# Patient Record
Sex: Female | Born: 1965 | Race: White | Hispanic: No | Marital: Single | Smoking: Never smoker
Health system: Southern US, Community
[De-identification: ages and names within clinical notes are randomized; demographics above are authoritative.]

## PROBLEM LIST (undated history)

## (undated) DIAGNOSIS — M7989 Other specified soft tissue disorders: Secondary | ICD-10-CM

## (undated) DIAGNOSIS — I8390 Asymptomatic varicose veins of unspecified lower extremity: Secondary | ICD-10-CM

## (undated) DIAGNOSIS — J302 Other seasonal allergic rhinitis: Secondary | ICD-10-CM

## (undated) HISTORY — PX: OTHER SURGICAL HISTORY: SHX169

---

## 2000-08-01 ENCOUNTER — Observation Stay (HOSPITAL_COMMUNITY): Admission: EM | Admit: 2000-08-01 | Discharge: 2000-08-02 | Payer: Self-pay | Admitting: Surgery

## 2000-08-29 ENCOUNTER — Emergency Department (HOSPITAL_COMMUNITY): Admission: EM | Admit: 2000-08-29 | Discharge: 2000-08-30 | Payer: Self-pay | Admitting: Emergency Medicine

## 2000-09-24 ENCOUNTER — Other Ambulatory Visit: Admission: RE | Admit: 2000-09-24 | Discharge: 2000-09-24 | Payer: Self-pay | Admitting: *Deleted

## 2016-01-30 ENCOUNTER — Emergency Department (HOSPITAL_BASED_OUTPATIENT_CLINIC_OR_DEPARTMENT_OTHER)
Admission: EM | Admit: 2016-01-30 | Discharge: 2016-01-30 | Disposition: A | Discharge status: Home / Self Care | Payer: BLUE CROSS/BLUE SHIELD | Attending: Emergency Medicine | Admitting: Emergency Medicine

## 2016-01-30 ENCOUNTER — Encounter (HOSPITAL_BASED_OUTPATIENT_CLINIC_OR_DEPARTMENT_OTHER): Payer: Self-pay | Admitting: Emergency Medicine

## 2016-01-30 DIAGNOSIS — Z8739 Personal history of other diseases of the musculoskeletal system and connective tissue: Secondary | ICD-10-CM | POA: Insufficient documentation

## 2016-01-30 DIAGNOSIS — T7840XA Allergy, unspecified, initial encounter: Secondary | ICD-10-CM | POA: Diagnosis present

## 2016-01-30 DIAGNOSIS — Z8679 Personal history of other diseases of the circulatory system: Secondary | ICD-10-CM | POA: Insufficient documentation

## 2016-01-30 DIAGNOSIS — T782XXA Anaphylactic shock, unspecified, initial encounter: Secondary | ICD-10-CM | POA: Diagnosis not present

## 2016-01-30 HISTORY — DX: Asymptomatic varicose veins of unspecified lower extremity: I83.90

## 2016-01-30 HISTORY — DX: Other seasonal allergic rhinitis: J30.2

## 2016-01-30 HISTORY — DX: Other specified soft tissue disorders: M79.89

## 2016-01-30 MED ORDER — RANITIDINE HCL 150 MG PO TABS: 150.0000 mg | ORAL_TABLET | Freq: Two times a day (BID) | ORAL | Status: DC

## 2016-01-30 MED ORDER — METHYLPREDNISOLONE SODIUM SUCC 125 MG IJ SOLR
125.0000 mg | Freq: Once | INTRAMUSCULAR | Status: AC
  Administered 2016-01-30: 125 mg via INTRAVENOUS
  Filled 2016-01-30: qty 2

## 2016-01-30 MED ORDER — DIPHENHYDRAMINE HCL 25 MG PO TABS: 50.0000 mg | ORAL_TABLET | Freq: Four times a day (QID) | ORAL | Status: DC

## 2016-01-30 MED ORDER — PREDNISONE 10 MG (21) PO TBPK: 10.0000 mg | ORAL_TABLET | Freq: Every day | ORAL | Status: DC

## 2016-01-30 NOTE — ED Provider Notes (Signed)
CSN: [id number]     Arrival date & time 01/30/16  1402 History  By signing my name below, I, Doran Stabler, attest that this documentation has been prepared under the direction and in the presence of No att. providers found. Electronically Signed: Doran Stabler, ED Scribe. 01/31/2016. 3:25 PM.   Chief Complaint  Patient presents with  . Allergic Reaction   The history is provided by the patient. No language interpreter was used.   HPI Comments: Gabrielle Vincent is a 50 y.o. female who presents to the Emergency Department with complaining of difficulty breathing and swelling due to a possible allergic reaction today, PTA. She is also tring to "build immunity to pollen" by taking local "pollen granules" today. However, she states a few minutes after ingesting the pollen granules, she began swelling up and had difficulty breathing. She was seen at [place] and received epinephrine and IV benadryl. Since then, she reports he symptoms have been improving and she is currently asymptomatic in the ED. Pt denies fevers, chills, CP, SOB, N/V/D or any other symptoms at this time. Pt has been on Allegra for 2 weeks.   Pt was recently on prednisone for an abscess on her left thigh. She felt as if her infection had worsened after being on prednisone and since then thinks "she is allergic to prednisone".  Past Medical History  Diagnosis Date  . Seasonal allergies   . Swelling of lower extremity     bilateral  . Varicose veins     right leg   Past Surgical History  Procedure Laterality Date  . Lymphnode drainage surgery     No family history on file. Social History  Substance Use Topics  . Smoking status: Never Smoker   . Smokeless tobacco: None  . Alcohol Use: No   OB History    No data available     Review of Systems  Constitutional: Negative for fever and chills.  Respiratory: Negative for shortness of breath.   Cardiovascular: Negative for chest pain.  Gastrointestinal: Negative for  nausea, vomiting and diarrhea.  All other systems reviewed and are negative.   Allergies  Prednisone  Home Medications   Prior to Admission medications   Medication Sig Start Date End Date Taking? Authorizing Provider  diphenhydrAMINE (BENADRYL) 25 MG tablet Take 2 tablets (50 mg total) by mouth every 6 (six) hours. Take 1-2 tablets every 6 hours x 2 days, then space out to an as needed basis 01/30/16   Alfonzo Beers, MD  predniSONE (STERAPRED UNI-PAK 21 TAB) 10 MG (21) TBPK tablet Take 1 tablet (10 mg total) by mouth daily. Take 6 tabs by mouth daily  for 2 days, then 5 tabs for 2 days, then 4 tabs for 2 days, then 3 tabs for 2 days, 2 tabs for 2 days, then 1 tab by mouth daily for 2 days 01/30/16   Alfonzo Beers, MD  ranitidine (ZANTAC) 150 MG tablet Take 1 tablet (150 mg total) by mouth 2 (two) times daily. 01/30/16   Alfonzo Beers, MD   BP 161/99 mmHg  Pulse 101  Temp(Src) 98.3 F (36.8 C) (Oral)  Resp 18  Ht 5\' 4"  (1.626 m)  Wt 175 lb (79.379 kg)  BMI 30.02 kg/m2  SpO2 97%  LMP  (LMP Unknown)  Vitals reviewed Physical Exam  Physical Examination: General appearance - alert, well appearing, and in no distress Mental status - alert, oriented to person, place, and time Eyes - no conjunctival injection, no scleral icterus Mouth -  mucous membranes moist, pharynx normal without lesions, no lip or tongue swelling, no uvular swelling Chest - clear to auscultation, no wheezes, rales or rhonchi, symmetric air entry Heart - normal rate, regular rhythm, normal S1, S2, no murmurs, rubs, clicks or gallops Abdomen - soft, nontender, nondistended, no masses or organomegaly Neurological - alert, oriented, normal speech Extremities - peripheral pulses normal, no pedal edema, no clubbing or cyanosis Skin - normal coloration and turgor, erythema and swelling of face  ED Course  Procedures  DIAGNOSTIC STUDIES: Oxygen Saturation is 100% on room air, normal by my interpretation.    COORDINATION  OF CARE: 3:16 PM Will give Solumedrol injection. Discussed treatment plan with pt at bedside and pt agreed to plan.  Labs Review MDM   Final diagnoses:  Anaphylaxis, initial encounter    Pt presenting with c/o facial swelling and difficulty breathing, she was treated immediately upon arrival to the ED- she had received epinephrine by EMS and IV benadryl, she was given solumedrol in the ED and observed to be sure there is no rebound from epinephrine.  Pt continues to feel improved and stable upon observation.    Of note, patient is not allergic to prednisone as documented- when she took prednisone years ago she states she developed an abscess that needed to be drained.  I explained to patient that this was an effect from the actio of prednisone and not an allergic reaction.  In this case of allergic reaction she definitely needs to be on a steroid- she is in agreement with this plan.    Discharged with strict return precautions.  Pt agreeable with plan.  I personally performed the services described in this documentation, which was scribed in my presence. The recorded information has been reviewed and is accurate.     Alfonzo Beers, MD 02/01/16 0002

## 2016-01-30 NOTE — ED Notes (Signed)
MD at bedside. 

## 2016-01-30 NOTE — ED Notes (Signed)
Patient ambulatory to restroom with cane.  

## 2016-01-30 NOTE — ED Notes (Signed)
Pt given Rx x 3 for zantac, prednisone, and benadryl. Pt's brother is picking her up from the ED. Ambulatory with steady gait to d/c window

## 2016-01-30 NOTE — ED Notes (Addendum)
Per EMS, patient picked up at Urgent Care. Patient went to be seen for allergic reaction, states she took Bee Pollen tablets @40  minutes ago. Patient with facial and neck swelling, was flushed, and reported difficulty breathing. Patient was given 0.3mg  epinipherine auto injector by urgent care and 50mg  IV benadryl by EMS. Patient is allergic to prednisone. Swelling has decreased, and patient is no longer flushed. Lungs CTA throughout.   Patient reports having a recent seasonal allergy flare up, states she had taken Bee Pollen in the past and thought she would try it again. Patient also recently used Allegra and an inhaler. Patient states she took @ 1/2 teaspoon of bee pollen granules. Patient states she noticed she was having a heard time breathing, began choking and gagging, and coughing. Patient states she had a significant amount of phlegm production. Patient states she then drove herself to the urgent care. Patient states she was swelling to her face, neck, and hands, swelling is resolved at this time. Patient is able to speak in complete sentences without difficulty at this time. Patient reports her only other allergy to be prednisone, and potentially to an unknown antibiotic. Patient describes lymph swelling as the allergic reaction to the prednisone.

## 2016-01-30 NOTE — Discharge Instructions (Signed)
Return to the ED with any concerns including difficulty breathing, lip or tongue swelling, vomiting, fainting, decreased level of alertness/lethargy, or any other alarming symptoms °

## 2016-02-07 DIAGNOSIS — I10 Essential (primary) hypertension: Secondary | ICD-10-CM | POA: Insufficient documentation

## 2016-02-07 DIAGNOSIS — T7840XA Allergy, unspecified, initial encounter: Secondary | ICD-10-CM | POA: Insufficient documentation

## 2016-02-07 DIAGNOSIS — E785 Hyperlipidemia, unspecified: Secondary | ICD-10-CM | POA: Insufficient documentation

## 2016-02-07 HISTORY — DX: Hyperlipidemia, unspecified: E78.5

## 2016-05-26 DIAGNOSIS — M1711 Unilateral primary osteoarthritis, right knee: Secondary | ICD-10-CM | POA: Insufficient documentation

## 2016-08-02 DIAGNOSIS — M23203 Derangement of unspecified medial meniscus due to old tear or injury, right knee: Secondary | ICD-10-CM | POA: Insufficient documentation

## 2021-01-24 ENCOUNTER — Other Ambulatory Visit: Payer: Self-pay | Admitting: Orthopedic Surgery

## 2021-01-24 DIAGNOSIS — M25562 Pain in left knee: Secondary | ICD-10-CM

## 2021-01-29 ENCOUNTER — Ambulatory Visit
Admission: RE | Admit: 2021-01-29 | Discharge: 2021-01-29 | Disposition: A | Discharge status: Home / Self Care | Payer: BLUE CROSS/BLUE SHIELD | Source: Ambulatory Visit | Attending: Orthopedic Surgery | Admitting: Orthopedic Surgery

## 2021-01-29 ENCOUNTER — Other Ambulatory Visit: Payer: Self-pay

## 2021-01-29 DIAGNOSIS — M25562 Pain in left knee: Secondary | ICD-10-CM

## 2021-01-29 IMAGING — MR MR KNEE*L* W/O CM
4 of 6 series · 23 of 40 positions shown · non-contrast
Comparison: None.

CLINICAL DATA: Onset left knee pain approximately 2-1/2 weeks ago.
No known injury.

EXAM:
MRI OF THE LEFT KNEE WITHOUT CONTRAST
TECHNIQUE: Multiplanar, multisequence MR imaging of the knee was performed. No
intravenous contrast was administered.

[Series 3: T2 fat-sat · axial · 4.0mm · 0.53mm/px · z∈[-43,+52]mm · 6 of 24 slices shown (1 of 2)]
[im 1/24]
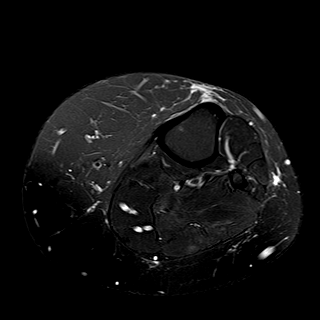
[im 4/24]
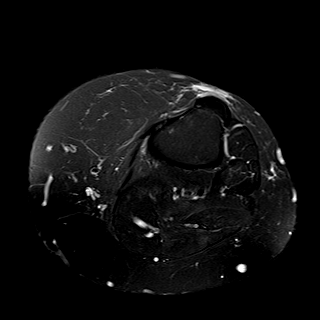
[im 8/24]
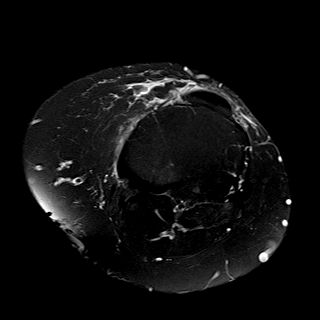
[im 12/24]
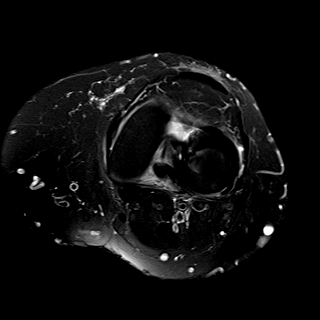
[im 16/24]
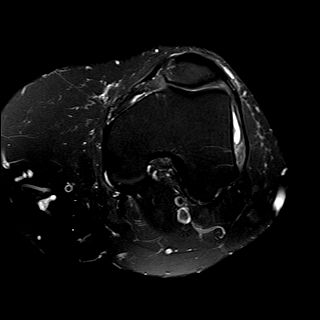
[im 20/24]
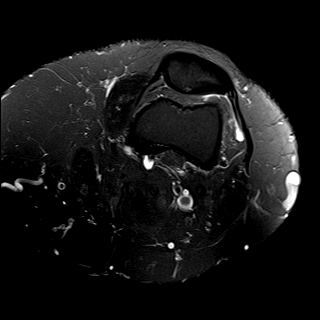

[Series 5: T2 fat-sat · coronal · 4.0mm · 0.31mm/px · 3 of 24 slices shown (2 of 2)]
[im 5/24]
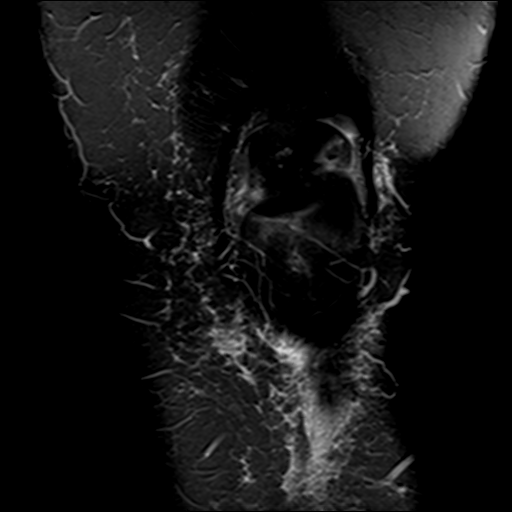
[im 14/24]
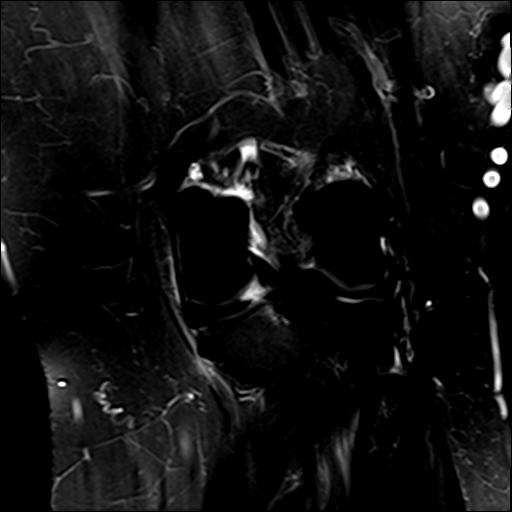
[im 24/24]
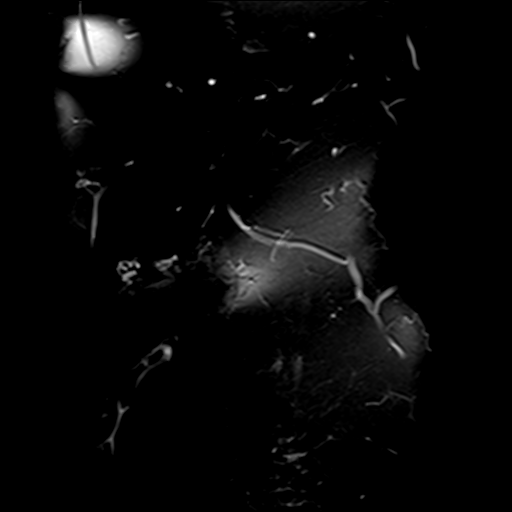

[Series 7: PD fat-sat · sagittal · 3.0mm · 0.31mm/px · 7 of 27 slices shown (1 of 2)]
[im 1/27]
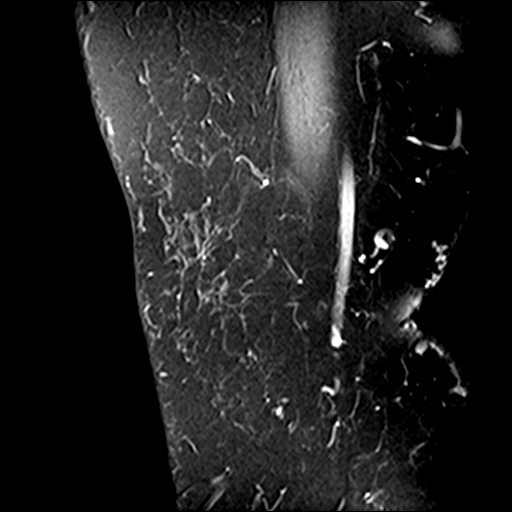
[im 5/27]
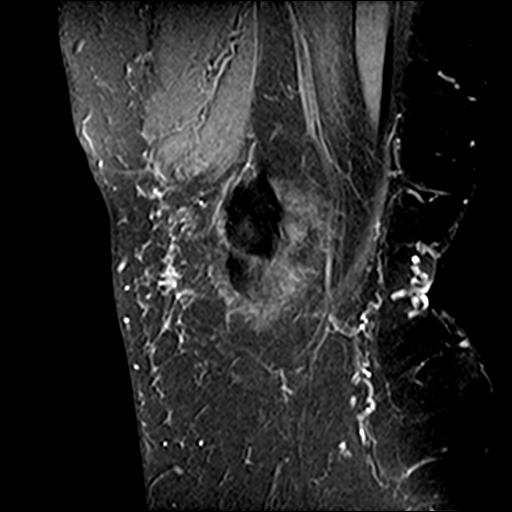
[im 9/27]
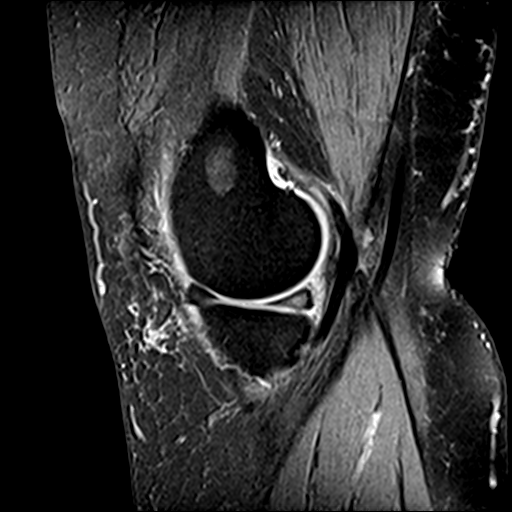
[im 14/27]
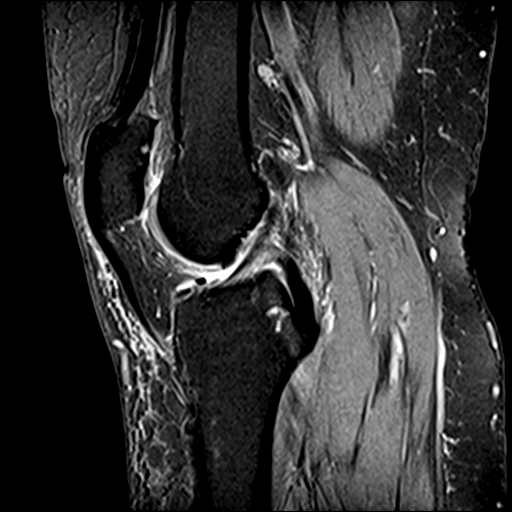
[im 18/27]
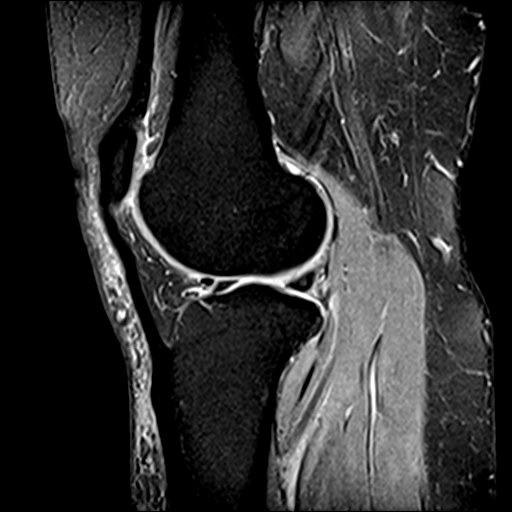
[im 22/27]
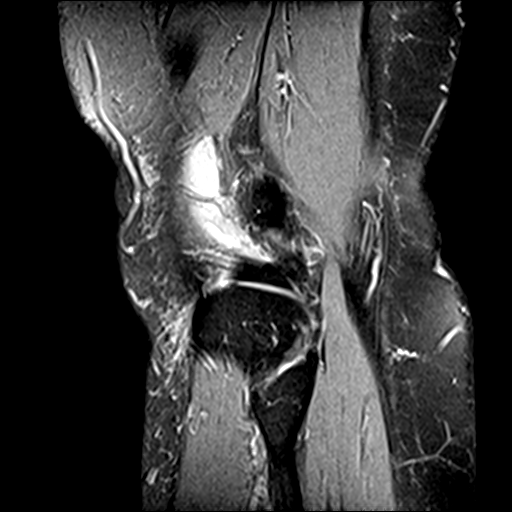
[im 27/27]
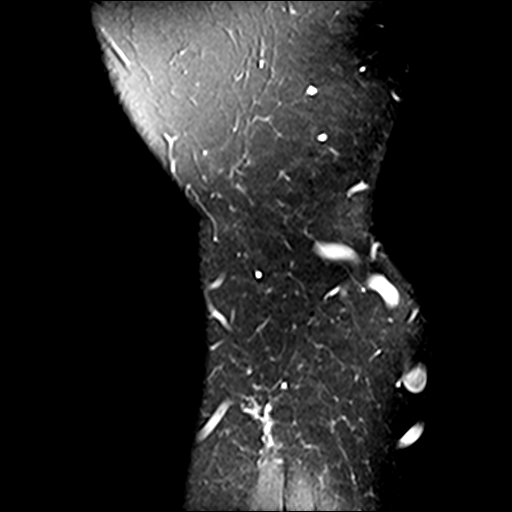

[Series 8: PD fat-sat · coronal · 3.0mm · 0.31mm/px · 7 of 28 slices shown (2 of 2)]
[im 1/28]
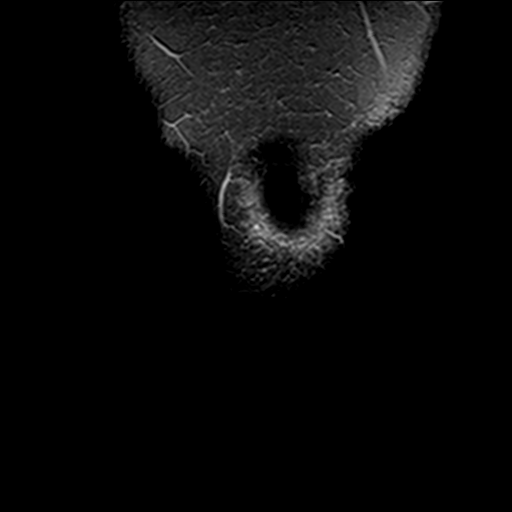
[im 5/28]
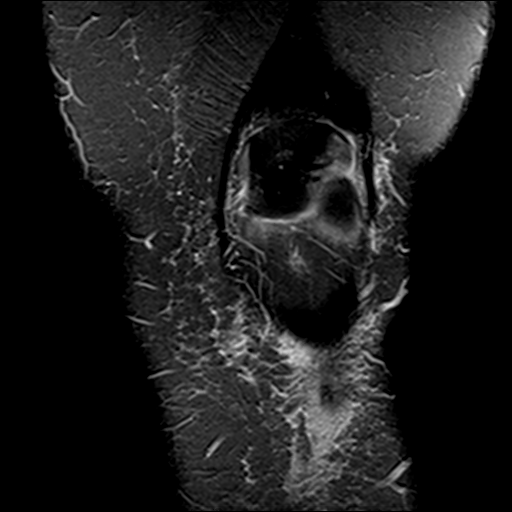
[im 10/28]
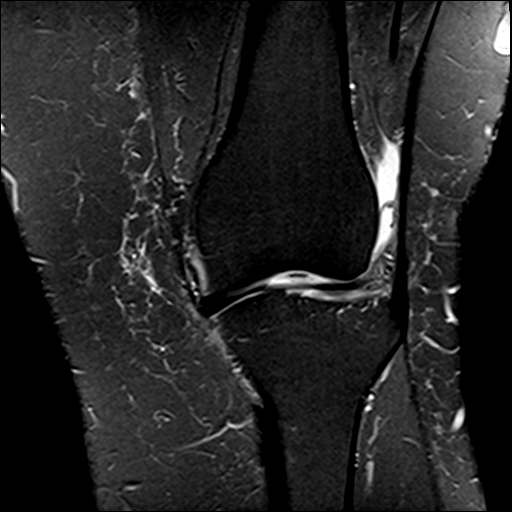
[im 14/28]
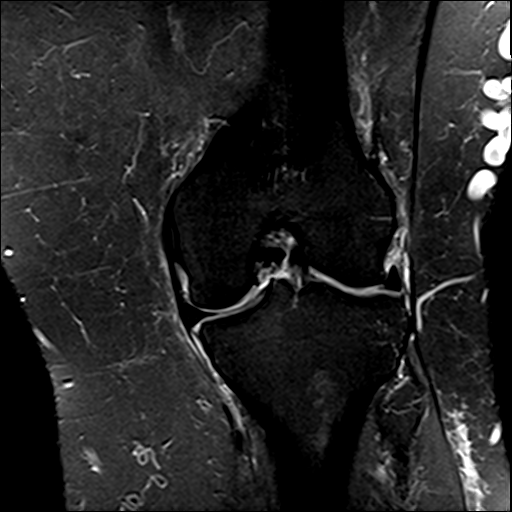
[im 19/28]
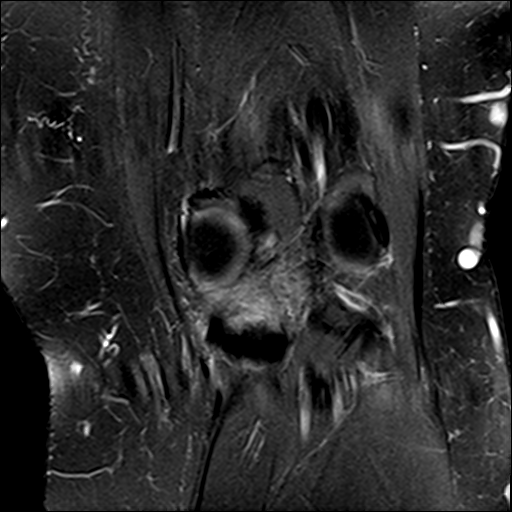
[im 23/28]
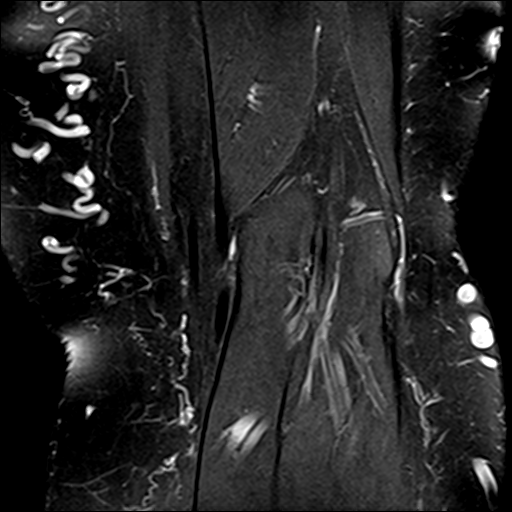
[im 28/28]
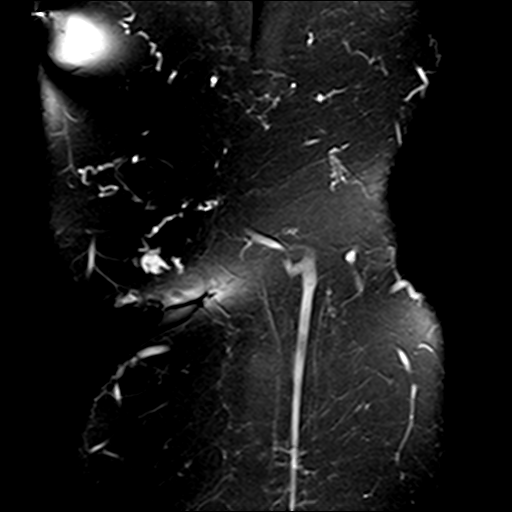

[23 of 40 positions shown; findings below may reference images not displayed]

FINDINGS: MENISCI

Medial meniscus: Complete radial tear through the root of the
posterior horn is seen. There is peripheral displacement of 0.6 cm.
Globular intrasubstance increased T2 signal in the remainder of the
posterior horn noted.

Lateral meniscus:  Intact.

LIGAMENTS

Cruciates:  Intact.

Collaterals:  Intact.

CARTILAGE

Patellofemoral: Cartilage thinning is most notable at the patellar
apex in the midpole.

Medial:  Preserved.

Lateral:  Preserved.

Joint:  Small joint effusion.

Popliteal Fossa:  No Baker's cyst.

Extensor Mechanism:  Intact.

Bones: No fracture, stress change or worrisome lesion. Small
osteophytes are seen about the knee.

Other: Multiple varicosities in the subcutaneous tissues noted.
IMPRESSION: Complete radial tear root of the posterior horn of the medial
meniscus with peripheral displacement of approximately 0.6 cm.

Mild osteoarthritis most notable in the patellofemoral compartment.

## 2022-02-14 ENCOUNTER — Inpatient Hospital Stay (HOSPITAL_BASED_OUTPATIENT_CLINIC_OR_DEPARTMENT_OTHER)
Admission: EM | Admit: 2022-02-14 | Discharge: 2022-03-21 | DRG: 003 | Disposition: A | Discharge status: Other Acute Inpatient Hospital | Payer: BC Managed Care – PPO | Attending: Family Medicine | Admitting: Family Medicine

## 2022-02-14 ENCOUNTER — Inpatient Hospital Stay (HOSPITAL_COMMUNITY): Payer: BC Managed Care – PPO

## 2022-02-14 ENCOUNTER — Encounter (HOSPITAL_BASED_OUTPATIENT_CLINIC_OR_DEPARTMENT_OTHER): Payer: Self-pay | Admitting: Emergency Medicine

## 2022-02-14 ENCOUNTER — Other Ambulatory Visit: Payer: Self-pay

## 2022-02-14 ENCOUNTER — Emergency Department (HOSPITAL_BASED_OUTPATIENT_CLINIC_OR_DEPARTMENT_OTHER): Payer: BC Managed Care – PPO

## 2022-02-14 DIAGNOSIS — A4151 Sepsis due to Escherichia coli [E. coli]: Principal | ICD-10-CM | POA: Diagnosis present

## 2022-02-14 DIAGNOSIS — G9341 Metabolic encephalopathy: Secondary | ICD-10-CM | POA: Diagnosis present

## 2022-02-14 DIAGNOSIS — E139 Other specified diabetes mellitus without complications: Secondary | ICD-10-CM | POA: Diagnosis not present

## 2022-02-14 DIAGNOSIS — E876 Hypokalemia: Secondary | ICD-10-CM

## 2022-02-14 DIAGNOSIS — M4802 Spinal stenosis, cervical region: Secondary | ICD-10-CM | POA: Diagnosis present

## 2022-02-14 DIAGNOSIS — B9620 Unspecified Escherichia coli [E. coli] as the cause of diseases classified elsewhere: Secondary | ICD-10-CM | POA: Diagnosis not present

## 2022-02-14 DIAGNOSIS — L89626 Pressure-induced deep tissue damage of left heel: Secondary | ICD-10-CM | POA: Diagnosis not present

## 2022-02-14 DIAGNOSIS — K8000 Calculus of gallbladder with acute cholecystitis without obstruction: Secondary | ICD-10-CM | POA: Diagnosis not present

## 2022-02-14 DIAGNOSIS — N10 Acute tubulo-interstitial nephritis: Secondary | ICD-10-CM

## 2022-02-14 DIAGNOSIS — R6521 Severe sepsis with septic shock: Secondary | ICD-10-CM | POA: Diagnosis present

## 2022-02-14 DIAGNOSIS — G061 Intraspinal abscess and granuloma: Secondary | ICD-10-CM | POA: Diagnosis present

## 2022-02-14 DIAGNOSIS — G9349 Other encephalopathy: Secondary | ICD-10-CM

## 2022-02-14 DIAGNOSIS — R509 Fever, unspecified: Secondary | ICD-10-CM | POA: Diagnosis not present

## 2022-02-14 DIAGNOSIS — R21 Rash and other nonspecific skin eruption: Secondary | ICD-10-CM | POA: Diagnosis not present

## 2022-02-14 DIAGNOSIS — R532 Functional quadriplegia: Secondary | ICD-10-CM | POA: Diagnosis not present

## 2022-02-14 DIAGNOSIS — M5416 Radiculopathy, lumbar region: Secondary | ICD-10-CM

## 2022-02-14 DIAGNOSIS — M4646 Discitis, unspecified, lumbar region: Secondary | ICD-10-CM | POA: Diagnosis present

## 2022-02-14 DIAGNOSIS — J9601 Acute respiratory failure with hypoxia: Secondary | ICD-10-CM | POA: Diagnosis present

## 2022-02-14 DIAGNOSIS — R918 Other nonspecific abnormal finding of lung field: Secondary | ICD-10-CM | POA: Diagnosis not present

## 2022-02-14 DIAGNOSIS — E1165 Type 2 diabetes mellitus with hyperglycemia: Secondary | ICD-10-CM | POA: Diagnosis present

## 2022-02-14 DIAGNOSIS — R6520 Severe sepsis without septic shock: Secondary | ICD-10-CM | POA: Diagnosis not present

## 2022-02-14 DIAGNOSIS — E870 Hyperosmolality and hypernatremia: Secondary | ICD-10-CM | POA: Diagnosis not present

## 2022-02-14 DIAGNOSIS — E66812 Obesity, class 2: Secondary | ICD-10-CM | POA: Diagnosis present

## 2022-02-14 DIAGNOSIS — R4781 Slurred speech: Secondary | ICD-10-CM | POA: Diagnosis not present

## 2022-02-14 DIAGNOSIS — G9511 Acute infarction of spinal cord (embolic) (nonembolic): Secondary | ICD-10-CM | POA: Diagnosis not present

## 2022-02-14 DIAGNOSIS — Z9911 Dependence on respirator [ventilator] status: Secondary | ICD-10-CM | POA: Diagnosis not present

## 2022-02-14 DIAGNOSIS — M5116 Intervertebral disc disorders with radiculopathy, lumbar region: Secondary | ICD-10-CM | POA: Diagnosis present

## 2022-02-14 DIAGNOSIS — L89611 Pressure ulcer of right heel, stage 1: Secondary | ICD-10-CM | POA: Diagnosis not present

## 2022-02-14 DIAGNOSIS — N390 Urinary tract infection, site not specified: Secondary | ICD-10-CM | POA: Diagnosis not present

## 2022-02-14 DIAGNOSIS — Z9889 Other specified postprocedural states: Secondary | ICD-10-CM | POA: Diagnosis not present

## 2022-02-14 DIAGNOSIS — E8721 Acute metabolic acidosis: Secondary | ICD-10-CM | POA: Diagnosis present

## 2022-02-14 DIAGNOSIS — K8020 Calculus of gallbladder without cholecystitis without obstruction: Secondary | ICD-10-CM | POA: Insufficient documentation

## 2022-02-14 DIAGNOSIS — I8390 Asymptomatic varicose veins of unspecified lower extremity: Secondary | ICD-10-CM | POA: Diagnosis present

## 2022-02-14 DIAGNOSIS — R7881 Bacteremia: Secondary | ICD-10-CM | POA: Diagnosis not present

## 2022-02-14 DIAGNOSIS — N170 Acute kidney failure with tubular necrosis: Secondary | ICD-10-CM | POA: Diagnosis present

## 2022-02-14 DIAGNOSIS — I700 Atherosclerosis of aorta: Secondary | ICD-10-CM | POA: Diagnosis present

## 2022-02-14 DIAGNOSIS — E669 Obesity, unspecified: Secondary | ICD-10-CM | POA: Diagnosis present

## 2022-02-14 DIAGNOSIS — R1310 Dysphagia, unspecified: Secondary | ICD-10-CM | POA: Diagnosis not present

## 2022-02-14 DIAGNOSIS — K2289 Other specified disease of esophagus: Secondary | ICD-10-CM | POA: Insufficient documentation

## 2022-02-14 DIAGNOSIS — G9340 Encephalopathy, unspecified: Secondary | ICD-10-CM | POA: Diagnosis not present

## 2022-02-14 DIAGNOSIS — G009 Bacterial meningitis, unspecified: Secondary | ICD-10-CM | POA: Diagnosis not present

## 2022-02-14 DIAGNOSIS — K760 Fatty (change of) liver, not elsewhere classified: Secondary | ICD-10-CM | POA: Diagnosis present

## 2022-02-14 DIAGNOSIS — M545 Low back pain, unspecified: Secondary | ICD-10-CM | POA: Diagnosis present

## 2022-02-14 DIAGNOSIS — M21372 Foot drop, left foot: Secondary | ICD-10-CM | POA: Diagnosis not present

## 2022-02-14 DIAGNOSIS — E860 Dehydration: Secondary | ICD-10-CM | POA: Diagnosis present

## 2022-02-14 DIAGNOSIS — M5117 Intervertebral disc disorders with radiculopathy, lumbosacral region: Secondary | ICD-10-CM | POA: Diagnosis present

## 2022-02-14 DIAGNOSIS — J9600 Acute respiratory failure, unspecified whether with hypoxia or hypercapnia: Secondary | ICD-10-CM | POA: Diagnosis not present

## 2022-02-14 DIAGNOSIS — Z881 Allergy status to other antibiotic agents status: Secondary | ICD-10-CM

## 2022-02-14 DIAGNOSIS — R911 Solitary pulmonary nodule: Secondary | ICD-10-CM | POA: Diagnosis present

## 2022-02-14 DIAGNOSIS — N179 Acute kidney failure, unspecified: Secondary | ICD-10-CM | POA: Diagnosis present

## 2022-02-14 DIAGNOSIS — D751 Secondary polycythemia: Secondary | ICD-10-CM | POA: Diagnosis present

## 2022-02-14 DIAGNOSIS — I10 Essential (primary) hypertension: Secondary | ICD-10-CM | POA: Diagnosis present

## 2022-02-14 DIAGNOSIS — J9311 Primary spontaneous pneumothorax: Secondary | ICD-10-CM

## 2022-02-14 DIAGNOSIS — D696 Thrombocytopenia, unspecified: Secondary | ICD-10-CM | POA: Diagnosis present

## 2022-02-14 DIAGNOSIS — Z880 Allergy status to penicillin: Secondary | ICD-10-CM

## 2022-02-14 DIAGNOSIS — M5440 Lumbago with sciatica, unspecified side: Secondary | ICD-10-CM | POA: Diagnosis not present

## 2022-02-14 DIAGNOSIS — J690 Pneumonitis due to inhalation of food and vomit: Secondary | ICD-10-CM | POA: Diagnosis not present

## 2022-02-14 DIAGNOSIS — B9689 Other specified bacterial agents as the cause of diseases classified elsewhere: Secondary | ICD-10-CM | POA: Diagnosis not present

## 2022-02-14 DIAGNOSIS — Z6841 Body Mass Index (BMI) 40.0 and over, adult: Secondary | ICD-10-CM

## 2022-02-14 DIAGNOSIS — I6312 Cerebral infarction due to embolism of basilar artery: Secondary | ICD-10-CM | POA: Diagnosis not present

## 2022-02-14 DIAGNOSIS — Z888 Allergy status to other drugs, medicaments and biological substances status: Secondary | ICD-10-CM

## 2022-02-14 DIAGNOSIS — E232 Diabetes insipidus: Secondary | ICD-10-CM

## 2022-02-14 DIAGNOSIS — A419 Sepsis, unspecified organism: Secondary | ICD-10-CM | POA: Diagnosis present

## 2022-02-14 DIAGNOSIS — E871 Hypo-osmolality and hyponatremia: Secondary | ICD-10-CM | POA: Diagnosis present

## 2022-02-14 DIAGNOSIS — K146 Glossodynia: Secondary | ICD-10-CM | POA: Diagnosis not present

## 2022-02-14 DIAGNOSIS — L89896 Pressure-induced deep tissue damage of other site: Secondary | ICD-10-CM | POA: Diagnosis not present

## 2022-02-14 DIAGNOSIS — M4726 Other spondylosis with radiculopathy, lumbar region: Secondary | ICD-10-CM | POA: Diagnosis present

## 2022-02-14 DIAGNOSIS — K209 Esophagitis, unspecified without bleeding: Secondary | ICD-10-CM | POA: Diagnosis present

## 2022-02-14 DIAGNOSIS — M009 Pyogenic arthritis, unspecified: Secondary | ICD-10-CM | POA: Diagnosis present

## 2022-02-14 DIAGNOSIS — J9621 Acute and chronic respiratory failure with hypoxia: Secondary | ICD-10-CM | POA: Diagnosis not present

## 2022-02-14 DIAGNOSIS — K7200 Acute and subacute hepatic failure without coma: Secondary | ICD-10-CM | POA: Diagnosis not present

## 2022-02-14 DIAGNOSIS — R7989 Other specified abnormal findings of blood chemistry: Secondary | ICD-10-CM | POA: Diagnosis present

## 2022-02-14 DIAGNOSIS — M21371 Foot drop, right foot: Secondary | ICD-10-CM | POA: Diagnosis not present

## 2022-02-14 DIAGNOSIS — G062 Extradural and subdural abscess, unspecified: Secondary | ICD-10-CM | POA: Diagnosis not present

## 2022-02-14 DIAGNOSIS — G039 Meningitis, unspecified: Secondary | ICD-10-CM

## 2022-02-14 DIAGNOSIS — Z1611 Resistance to penicillins: Secondary | ICD-10-CM | POA: Diagnosis present

## 2022-02-14 DIAGNOSIS — L89309 Pressure ulcer of unspecified buttock, unspecified stage: Secondary | ICD-10-CM | POA: Diagnosis not present

## 2022-02-14 DIAGNOSIS — I6329 Cerebral infarction due to unspecified occlusion or stenosis of other precerebral arteries: Secondary | ICD-10-CM | POA: Diagnosis not present

## 2022-02-14 DIAGNOSIS — D252 Subserosal leiomyoma of uterus: Secondary | ICD-10-CM | POA: Diagnosis present

## 2022-02-14 DIAGNOSIS — E785 Hyperlipidemia, unspecified: Secondary | ICD-10-CM | POA: Diagnosis present

## 2022-02-14 DIAGNOSIS — K5790 Diverticulosis of intestine, part unspecified, without perforation or abscess without bleeding: Secondary | ICD-10-CM

## 2022-02-14 DIAGNOSIS — Z794 Long term (current) use of insulin: Secondary | ICD-10-CM | POA: Diagnosis not present

## 2022-02-14 DIAGNOSIS — G008 Other bacterial meningitis: Secondary | ICD-10-CM | POA: Diagnosis present

## 2022-02-14 DIAGNOSIS — E781 Pure hyperglyceridemia: Secondary | ICD-10-CM | POA: Diagnosis present

## 2022-02-14 DIAGNOSIS — G8220 Paraplegia, unspecified: Secondary | ICD-10-CM | POA: Diagnosis not present

## 2022-02-14 DIAGNOSIS — E878 Other disorders of electrolyte and fluid balance, not elsewhere classified: Secondary | ICD-10-CM | POA: Diagnosis not present

## 2022-02-14 DIAGNOSIS — D6489 Other specified anemias: Secondary | ICD-10-CM | POA: Diagnosis present

## 2022-02-14 DIAGNOSIS — R6510 Systemic inflammatory response syndrome (SIRS) of non-infectious origin without acute organ dysfunction: Secondary | ICD-10-CM

## 2022-02-14 DIAGNOSIS — E8770 Fluid overload, unspecified: Secondary | ICD-10-CM | POA: Diagnosis not present

## 2022-02-14 DIAGNOSIS — M4642 Discitis, unspecified, cervical region: Secondary | ICD-10-CM | POA: Diagnosis present

## 2022-02-14 DIAGNOSIS — L8990 Pressure ulcer of unspecified site, unspecified stage: Secondary | ICD-10-CM | POA: Insufficient documentation

## 2022-02-14 HISTORY — DX: Obesity, unspecified: E66.9

## 2022-02-14 HISTORY — DX: Fatty (change of) liver, not elsewhere classified: K76.0

## 2022-02-14 HISTORY — DX: Atherosclerosis of aorta: I70.0

## 2022-02-14 HISTORY — DX: Acute kidney failure, unspecified: N17.9

## 2022-02-14 HISTORY — DX: Diverticulosis of intestine, part unspecified, without perforation or abscess without bleeding: K57.90

## 2022-02-14 LAB — URINALYSIS, ROUTINE W REFLEX MICROSCOPIC
Bilirubin Urine: NEGATIVE
Glucose, UA: NEGATIVE mg/dL
Ketones, ur: NEGATIVE mg/dL
Nitrite: NEGATIVE
Protein, ur: 100 mg/dL — AB
RBC / HPF: >50 RBC/hpf — ABNORMAL HIGH (ref 0–5)
Specific Gravity, Urine: 1.012 (ref 1.005–1.030)
WBC, UA: >50 WBC/hpf — ABNORMAL HIGH (ref 0–5)
pH: 5.5 (ref 5.0–8.0)

## 2022-02-14 LAB — GLUCOSE, CAPILLARY
Glucose-Capillary: 168 mg/dL — ABNORMAL HIGH (ref 70–99)
Glucose-Capillary: 247 mg/dL — ABNORMAL HIGH (ref 70–99)

## 2022-02-14 LAB — COMPREHENSIVE METABOLIC PANEL
ALT: 58 U/L — ABNORMAL HIGH (ref 0–44)
ALT: 66 U/L — ABNORMAL HIGH (ref 0–44)
AST: 94 U/L — ABNORMAL HIGH (ref 15–41)
AST: 122 U/L — ABNORMAL HIGH (ref 15–41)
Albumin: 3.4 g/dL — ABNORMAL LOW (ref 3.5–5.0)
Albumin: 2.6 g/dL — ABNORMAL LOW (ref 3.5–5.0)
Alkaline Phosphatase: 269 U/L — ABNORMAL HIGH (ref 38–126)
Alkaline Phosphatase: 256 U/L — ABNORMAL HIGH (ref 38–126)
Anion gap: 19 — ABNORMAL HIGH (ref 5–15)
Anion gap: 14 (ref 5–15)
BUN: 49 mg/dL — ABNORMAL HIGH (ref 6–20)
BUN: 52 mg/dL — ABNORMAL HIGH (ref 6–20)
CO2: 18 mmol/L — ABNORMAL LOW (ref 22–32)
CO2: 15 mmol/L — ABNORMAL LOW (ref 22–32)
Calcium: 9.4 mg/dL (ref 8.9–10.3)
Calcium: 7.8 mg/dL — ABNORMAL LOW (ref 8.9–10.3)
Chloride: 90 mmol/L — ABNORMAL LOW (ref 98–111)
Chloride: 100 mmol/L (ref 98–111)
Creatinine, Ser: 3.64 mg/dL — ABNORMAL HIGH (ref 0.44–1.00)
Creatinine, Ser: 3.3 mg/dL — ABNORMAL HIGH (ref 0.44–1.00)
GFR, Estimated: 14 mL/min — ABNORMAL LOW (ref >60)
GFR, Estimated: 16 mL/min — ABNORMAL LOW (ref >60)
Glucose, Bld: 185 mg/dL — ABNORMAL HIGH (ref 70–99)
Glucose, Bld: 168 mg/dL — ABNORMAL HIGH (ref 70–99)
Potassium: 3.4 mmol/L — ABNORMAL LOW (ref 3.5–5.1)
Potassium: 3.4 mmol/L — ABNORMAL LOW (ref 3.5–5.1)
Sodium: 127 mmol/L — ABNORMAL LOW (ref 135–145)
Sodium: 129 mmol/L — ABNORMAL LOW (ref 135–145)
Total Bilirubin: 2.1 mg/dL — ABNORMAL HIGH (ref 0.3–1.2)
Total Bilirubin: 2.3 mg/dL — ABNORMAL HIGH (ref 0.3–1.2)
Total Protein: 7.3 g/dL (ref 6.5–8.1)
Total Protein: 6.8 g/dL (ref 6.5–8.1)

## 2022-02-14 LAB — CBC WITH DIFFERENTIAL/PLATELET
Abs Immature Granulocytes: 0.93 10*3/uL — ABNORMAL HIGH (ref 0.00–0.07)
Basophils Absolute: 0 10*3/uL (ref 0.0–0.1)
Basophils Relative: 0 %
Eosinophils Absolute: 0 10*3/uL (ref 0.0–0.5)
Eosinophils Relative: 0 %
HCT: 46.1 % — ABNORMAL HIGH (ref 36.0–46.0)
Hemoglobin: 15.9 g/dL — ABNORMAL HIGH (ref 12.0–15.0)
Immature Granulocytes: 4 %
Lymphocytes Relative: 4 %
Lymphs Abs: 0.9 10*3/uL (ref 0.7–4.0)
MCH: 28.8 pg (ref 26.0–34.0)
MCHC: 34.5 g/dL (ref 30.0–36.0)
MCV: 83.5 fL (ref 80.0–100.0)
Monocytes Absolute: 1.4 10*3/uL — ABNORMAL HIGH (ref 0.1–1.0)
Monocytes Relative: 7 %
Neutro Abs: 18 10*3/uL — ABNORMAL HIGH (ref 1.7–7.7)
Neutrophils Relative %: 85 %
Platelets: 106 10*3/uL — ABNORMAL LOW (ref 150–400)
RBC: 5.52 MIL/uL — ABNORMAL HIGH (ref 3.87–5.11)
RDW: 15.9 % — ABNORMAL HIGH (ref 11.5–15.5)
WBC: 21.3 10*3/uL — ABNORMAL HIGH (ref 4.0–10.5)
nRBC: 0 % (ref 0.0–0.2)

## 2022-02-14 LAB — CBC
HCT: 44.1 % (ref 36.0–46.0)
Hemoglobin: 15.2 g/dL — ABNORMAL HIGH (ref 12.0–15.0)
MCH: 29.3 pg (ref 26.0–34.0)
MCHC: 34.5 g/dL (ref 30.0–36.0)
MCV: 85 fL (ref 80.0–100.0)
Platelets: 101 10*3/uL — ABNORMAL LOW (ref 150–400)
RBC: 5.19 MIL/uL — ABNORMAL HIGH (ref 3.87–5.11)
RDW: 15.9 % — ABNORMAL HIGH (ref 11.5–15.5)
WBC: 16.1 10*3/uL — ABNORMAL HIGH (ref 4.0–10.5)
nRBC: 0 % (ref 0.0–0.2)

## 2022-02-14 LAB — PROCALCITONIN: Procalcitonin: 35.18 ng/mL

## 2022-02-14 LAB — HEPATITIS PANEL, ACUTE
HCV Ab: NONREACTIVE
Hep A IgM: NONREACTIVE
Hep B C IgM: NONREACTIVE
Hepatitis B Surface Ag: NONREACTIVE

## 2022-02-14 LAB — LIPASE, BLOOD: Lipase: 23 U/L (ref 11–51)

## 2022-02-14 LAB — LACTIC ACID, PLASMA: Lactic Acid, Venous: 1.9 mmol/L (ref 0.5–1.9)

## 2022-02-14 IMAGING — MR MR LUMBAR SPINE W/O CM
5 series · 31 of 48 positions shown · non-contrast
Comparison: None Available.

CLINICAL DATA: Lumbar myelopathy

EXAM:
MRI LUMBAR SPINE WITHOUT CONTRAST
TECHNIQUE: Multiplanar, multisequence MR imaging of the lumbar spine was
performed. No intravenous contrast was administered.

[Series 5: T1 · sagittal · 4.0mm · 0.81mm/px · 6 of 17 slices shown (1 of 2)]
[im 1/17]
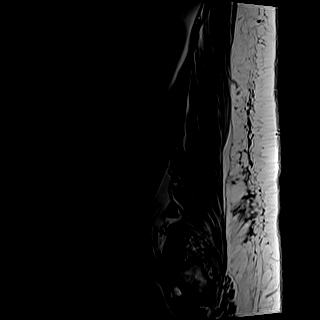
[im 4/17]
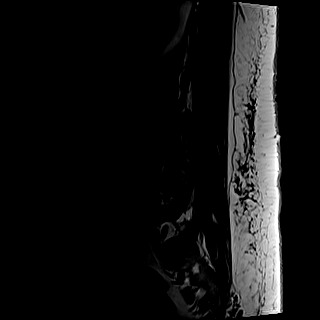
[im 7/17]
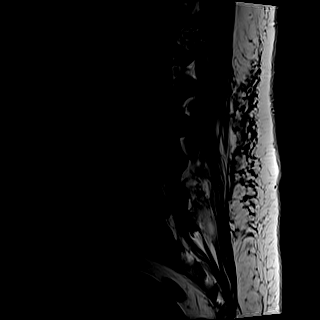
[im 10/17]
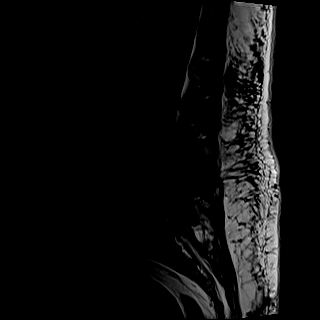
[im 13/17]
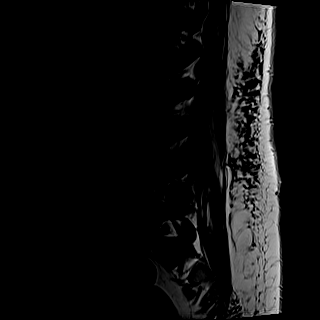
[im 17/17]
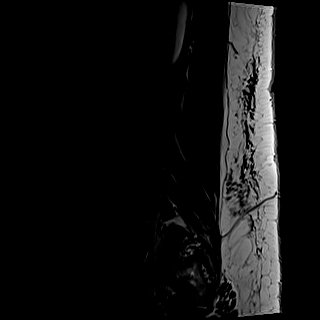

[Series 6: T2 · sagittal · 4.0mm · 0.81mm/px · 6 of 17 slices shown (1 of 2)]
[im 1/17]
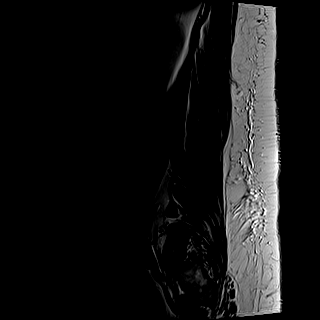
[im 4/17]
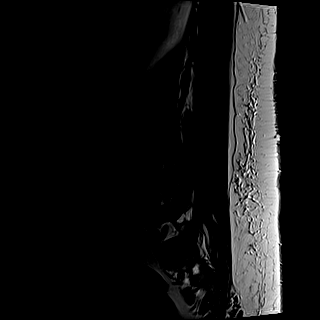
[im 7/17]
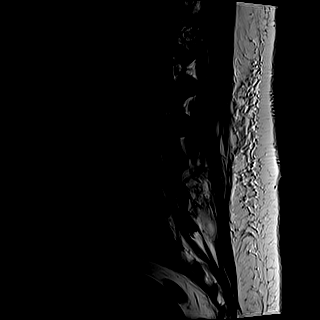
[im 10/17]
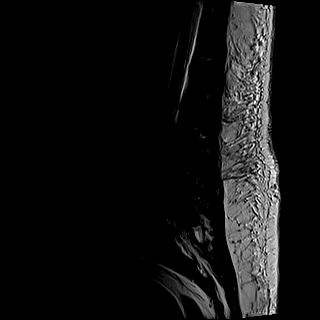
[im 13/17]
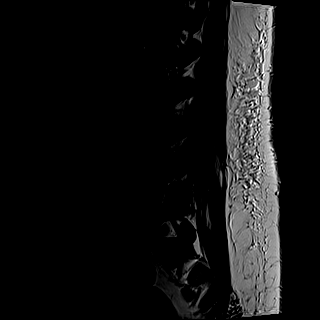
[im 17/17]
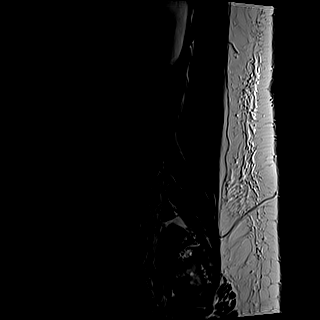

[Series 7: STIR · sagittal · 4.0mm · 0.51mm/px · 1 of 17 slices shown]
[im 1/17]
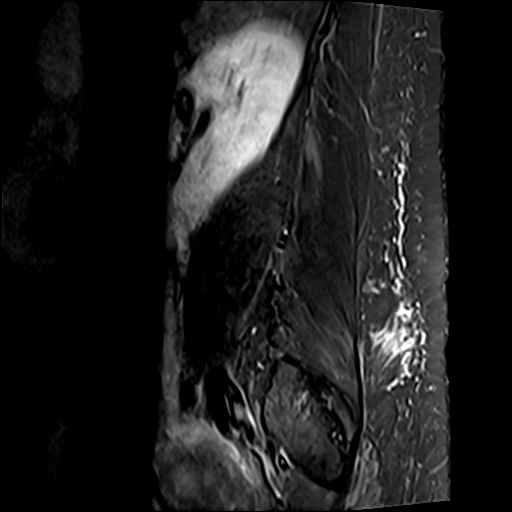

[Series 8: T2 · axial · 4.0mm · 0.62mm/px · z∈[-59,+156]mm · 9 of 40 slices shown (2 of 2)]
[im 1/40]
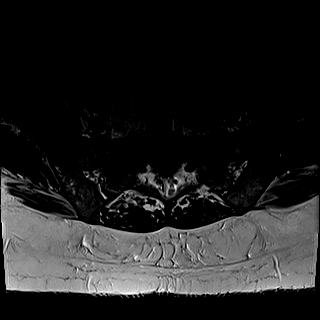
[im 6/40]
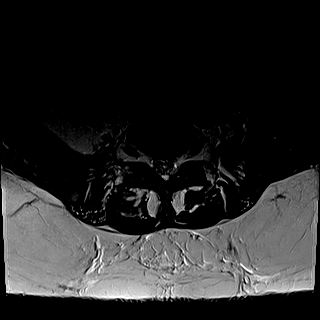
[im 12/40]
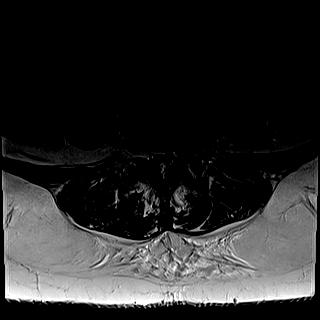
[im 17/40]
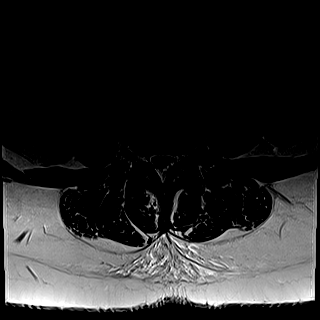
[im 20/40]
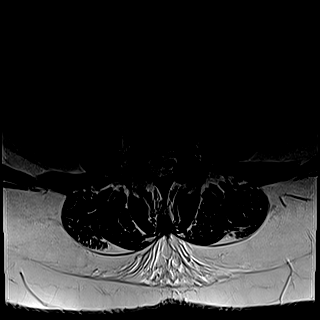
[im 23/40]
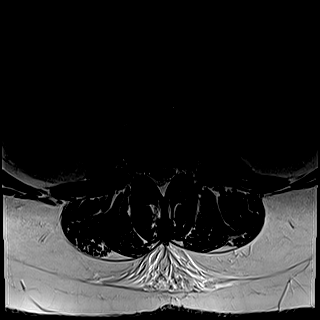
[im 28/40]
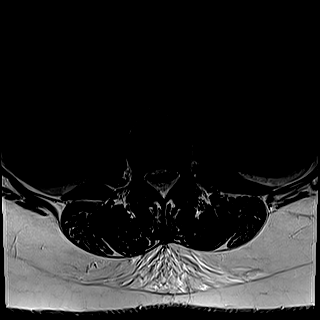
[im 34/40]
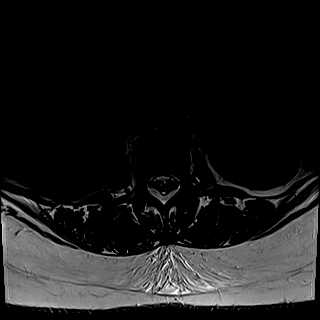
[im 40/40]
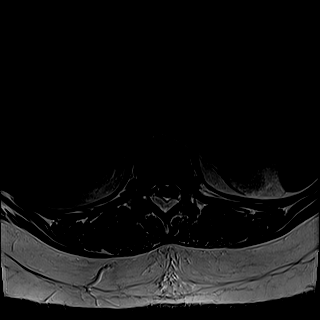

[Series 9: T1 · axial · 4.0mm · 0.39mm/px · z∈[-59,+156]mm · 9 of 40 slices shown (2 of 2)]
[im 1/40]
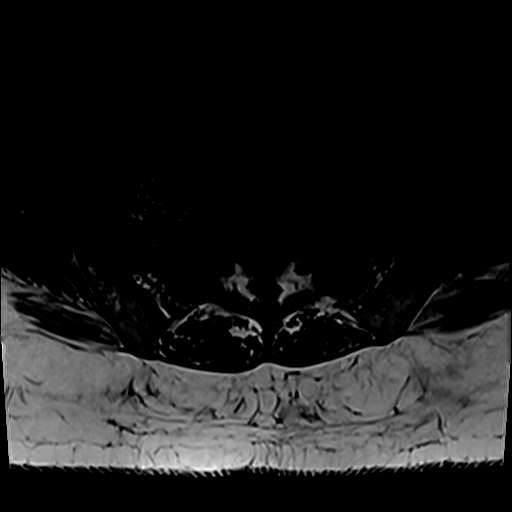
[im 6/40]
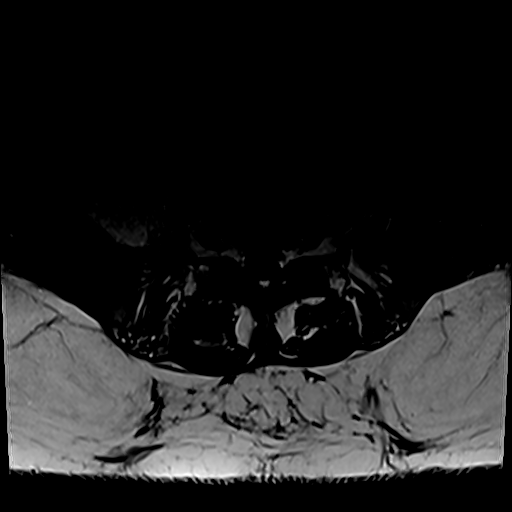
[im 12/40]
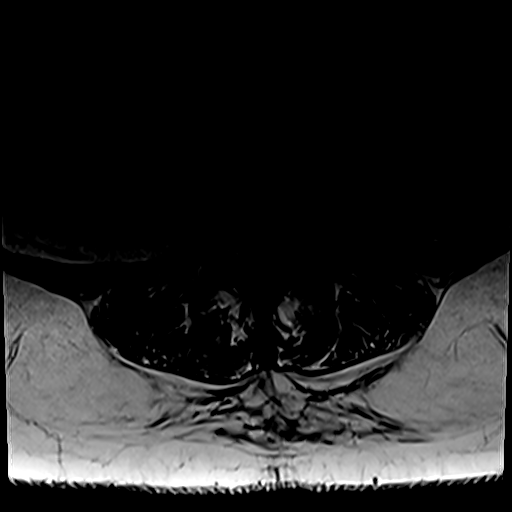
[im 17/40]
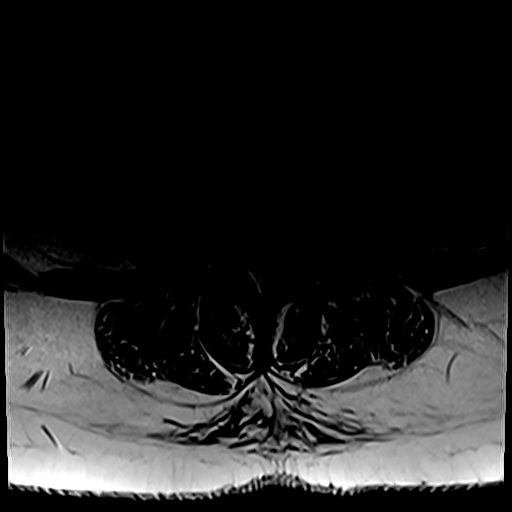
[im 20/40]
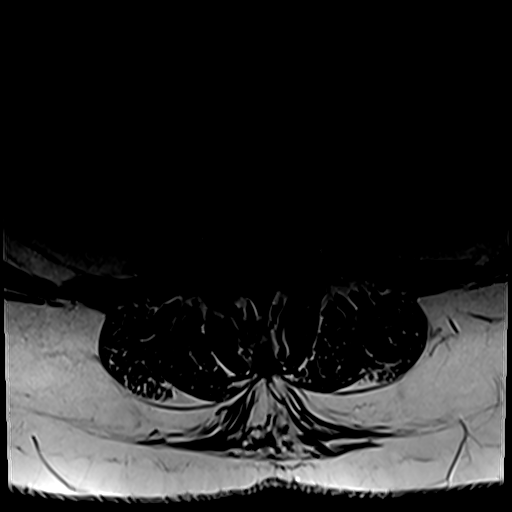
[im 23/40]
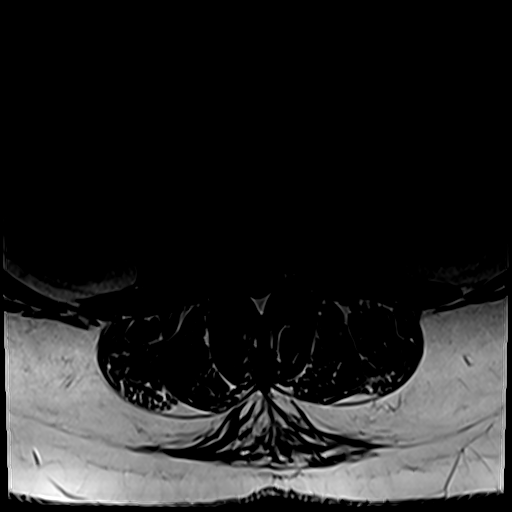
[im 28/40]
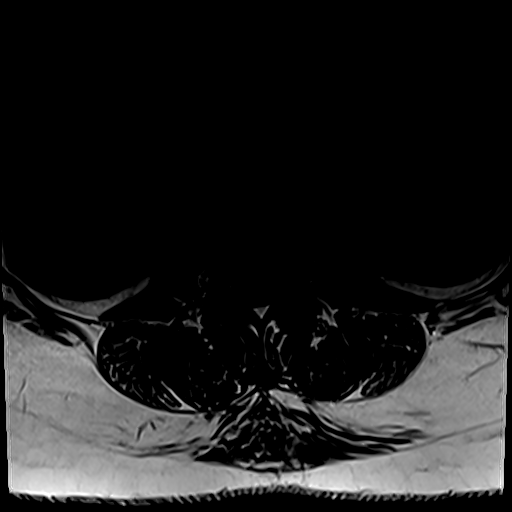
[im 34/40]
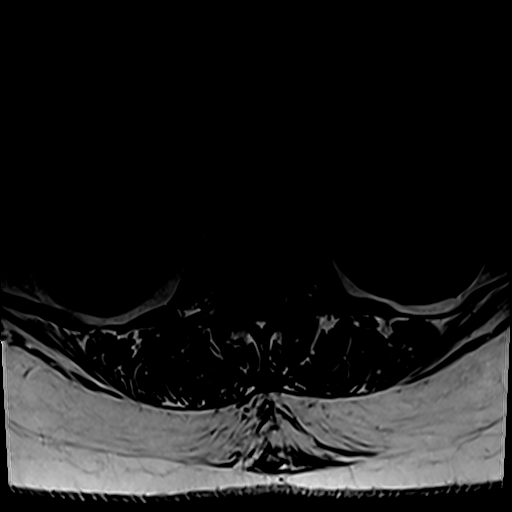
[im 40/40]
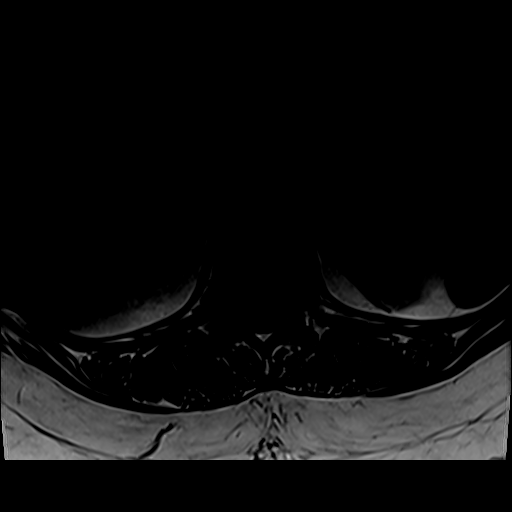

[31 of 48 positions shown; findings below may reference images not displayed]

FINDINGS: Segmentation:  Standard.

Alignment:  Physiologic.

Vertebrae:  No fracture, evidence of discitis, or bone lesion.

Conus medullaris and cauda equina: Conus extends to the L1 level.
Conus and cauda equina appear normal.

Paraspinal and other soft tissues: Negative.

Disc levels:

There is dorsal epidural lipomatosis. This causes thecal sac
narrowing throughout most of the lumbar spinal canal.

L1-L2: Normal disc space and facet joints. No spinal canal stenosis.
No neural foraminal stenosis.

L2-L3: Normal disc space and facet joints. No spinal canal stenosis.
No neural foraminal stenosis.

L3-L4: Small left subarticular disc extrusion with inferior
migration. Left lateral recess narrowing without central spinal
canal stenosis. No neural foraminal stenosis.

L4-L5: Moderate facet hypertrophy with widening of the facets,
left-greater-than-right. No spinal canal stenosis. No neural
foraminal stenosis.

L5-S1: Small central disc protrusion. No spinal canal stenosis. No
neural foraminal stenosis.

Visualized sacrum: Normal.
IMPRESSION: 1. Diffuse thecal sac narrowing throughout most of the lumbar spinal
canal secondary to dorsal epidural lipomatosis.
2. Small left subarticular disc extrusion with inferior migration at
L3-L4 narrowing the left lateral recess. Correlate for left L4
radiculopathy.
3. Moderate L4-L5 facet arthrosis with widening of the facets,
left-greater-than-right. This may be a source of local low back
pain.
4. Small central disc protrusion at L5-S1 without associated
stenosis.

## 2022-02-14 IMAGING — US US ABDOMEN LIMITED
1 series · 15 of 25 positions shown · non-contrast
Comparison: CT abdomen pelvis dated [DATE].

CLINICAL DATA: Elevated liver enzymes.

EXAM:
ULTRASOUND ABDOMEN LIMITED RIGHT UPPER QUADRANT

[Series 1: us abdomen limited ruq mc & wl · 15 of 41 slices shown]
[im 1/41]
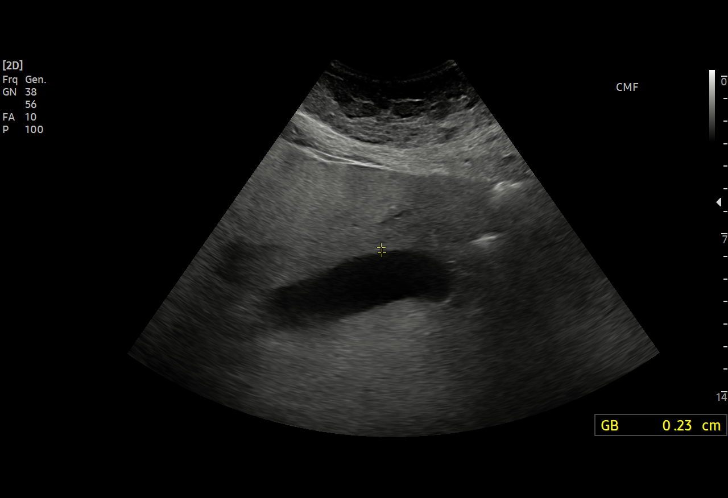
[im 4/41]
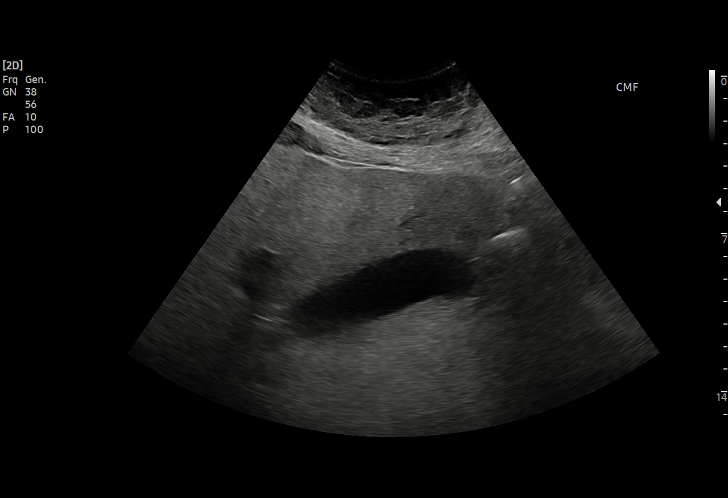
[im 7/41]
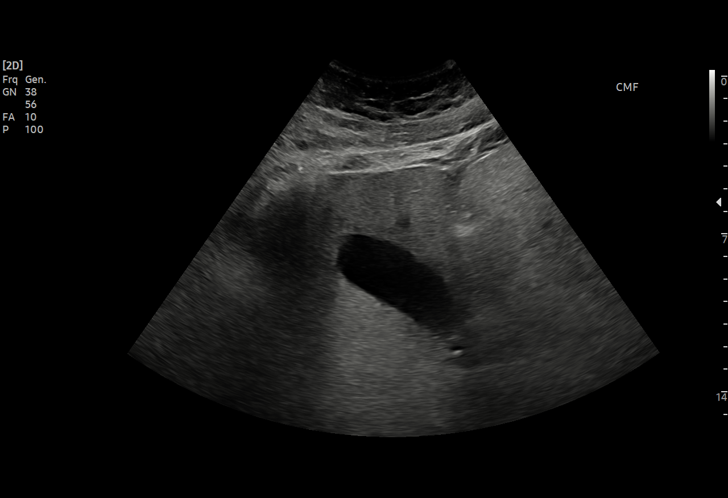
[im 9/41]
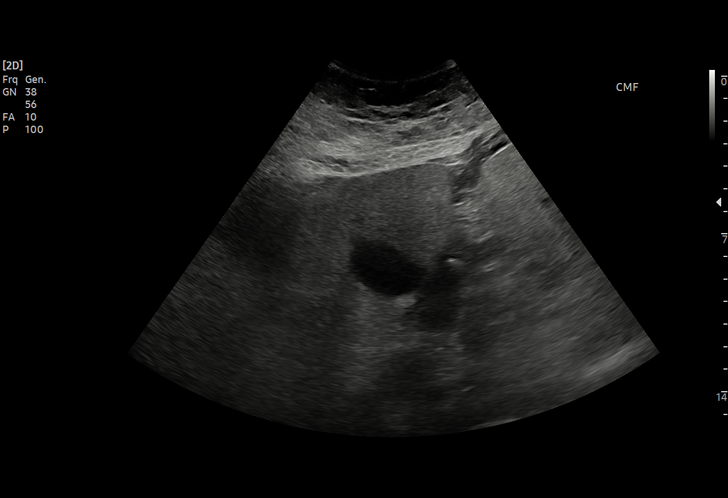
[im 12/41]
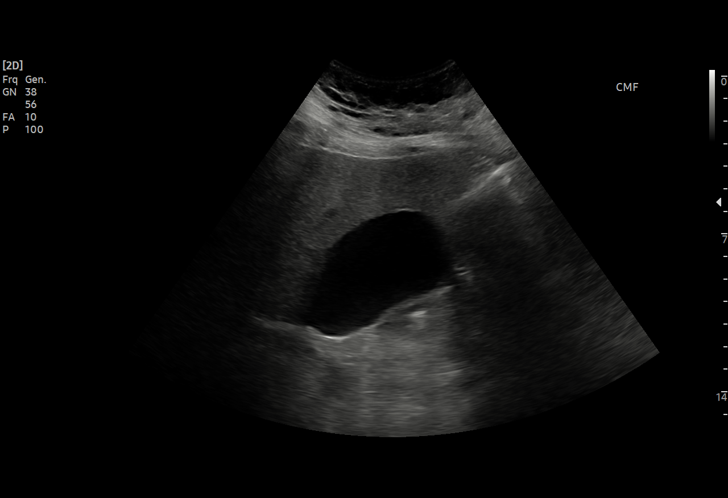
[im 16/41]
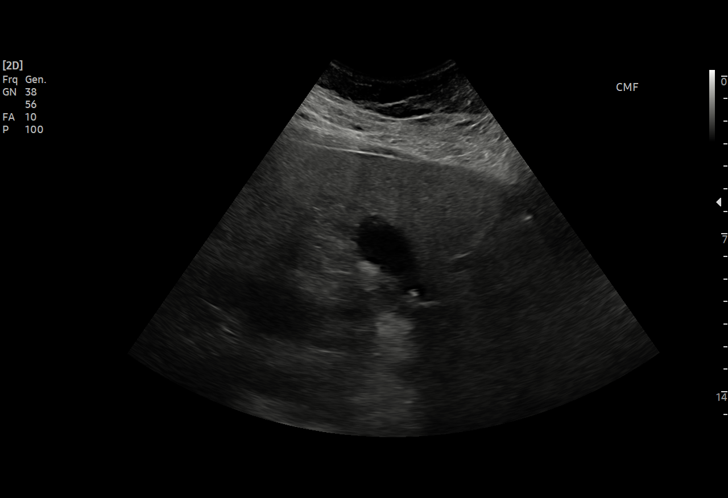
[im 17/41]
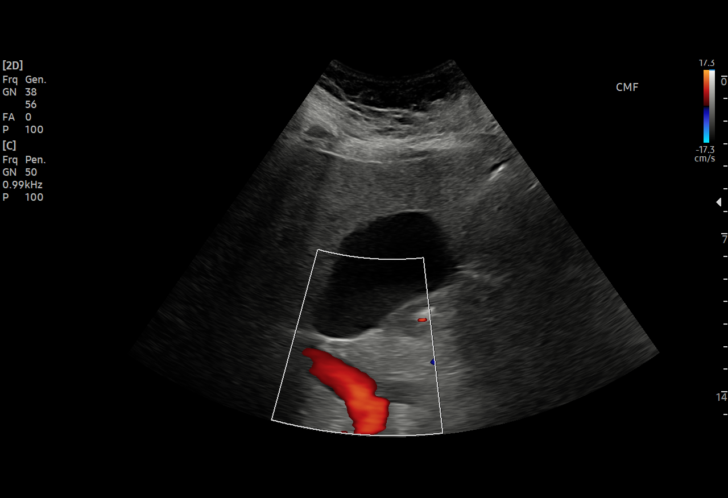
[im 21/41]
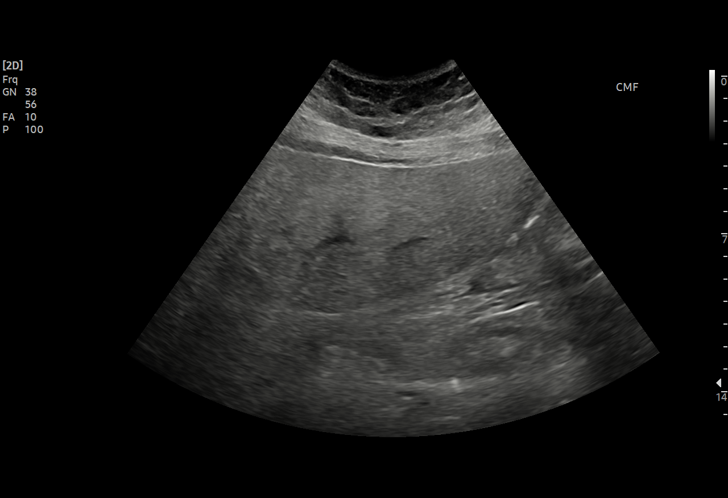
[im 24/41]
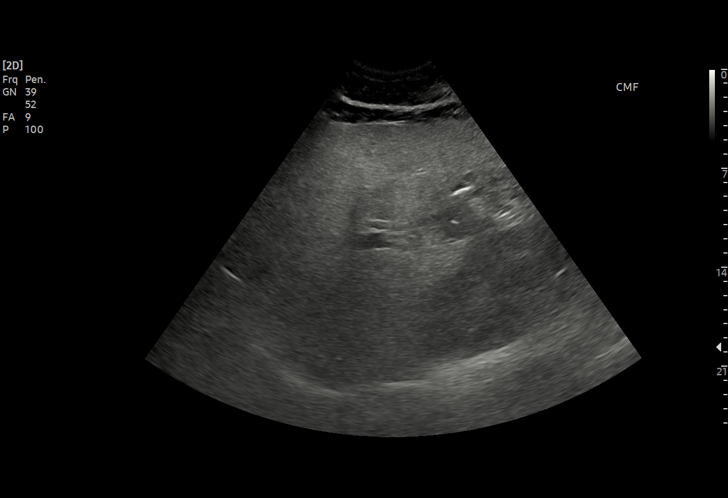
[im 26/41]
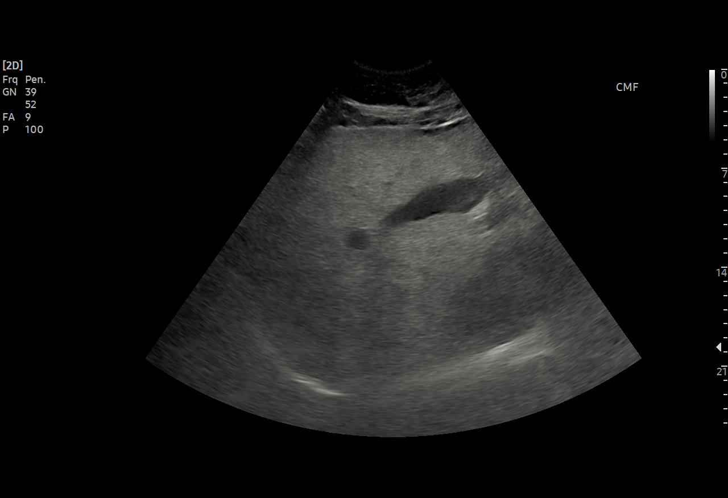
[im 29/41]
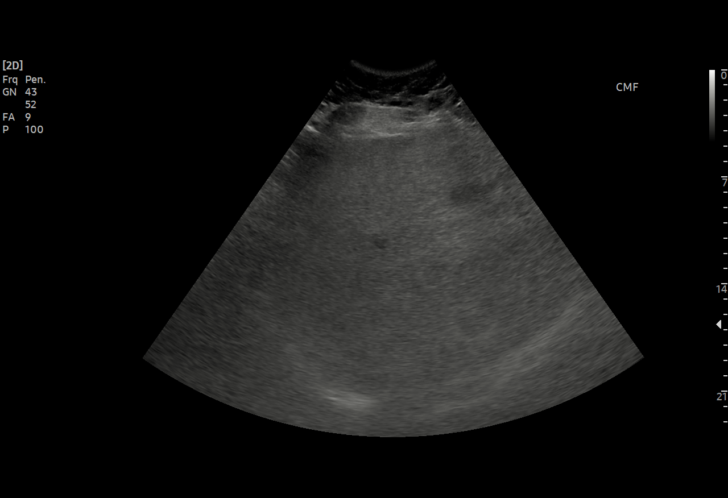
[im 32/41]
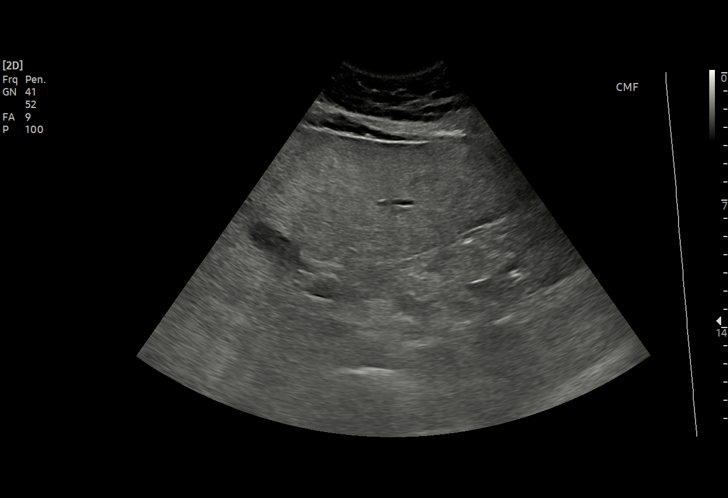
[im 34/41]
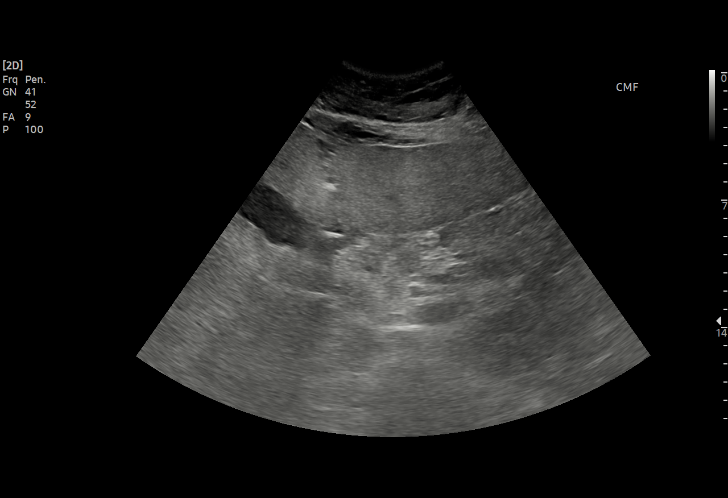
[im 37/41]
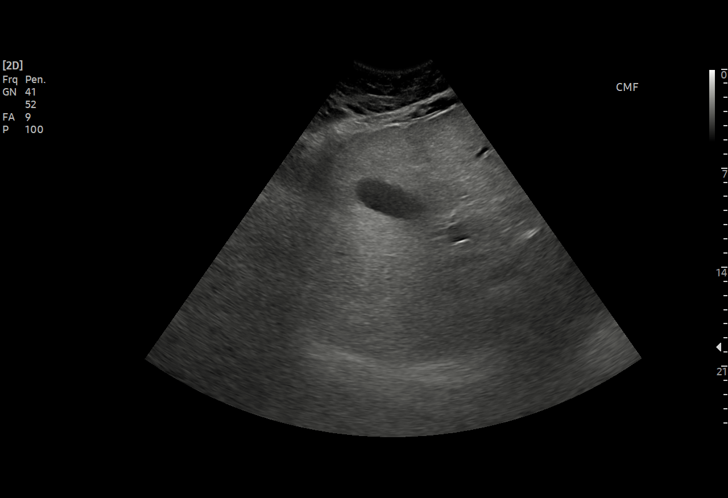
[im 41/41]
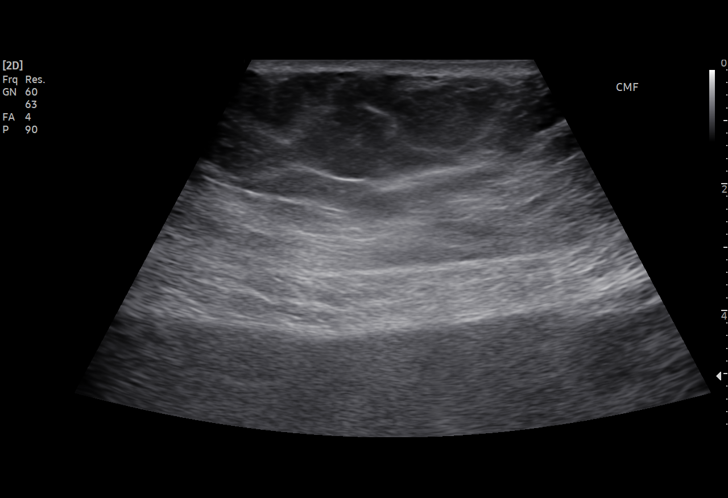

[15 of 25 positions shown; findings below may reference images not displayed]

FINDINGS: Gallbladder:

There is layering sludge within the gallbladder. No shadowing stone.
No gallbladder wall thickening or pericholecystic fluid. Negative
sonographic Murphy's sign.

Common bile duct:

Diameter: 3 mm

Liver:

There is diffuse increased liver echogenicity most commonly seen in
the setting of fatty infiltration. Superimposed inflammation or
fibrosis is not excluded. Clinical correlation is recommended.
Portal vein is patent on color Doppler imaging with normal direction
of blood flow towards the liver.

Other: None.
IMPRESSION: Fatty liver, otherwise unremarkable right upper quadrant ultrasound.

## 2022-02-14 IMAGING — CT CT RENAL STONE PROTOCOL
2 of 4 series · 15 of 46 positions shown, 17 images · non-contrast
Comparison: None Available.

CLINICAL DATA: Flank pain, kidney stone suspected



[Series 2: stone full · axial · 0.75mm/px · z∈[-72,+373]mm · 12 of 103 slices shown, 14 images]
[im 9/103  soft-tissue]
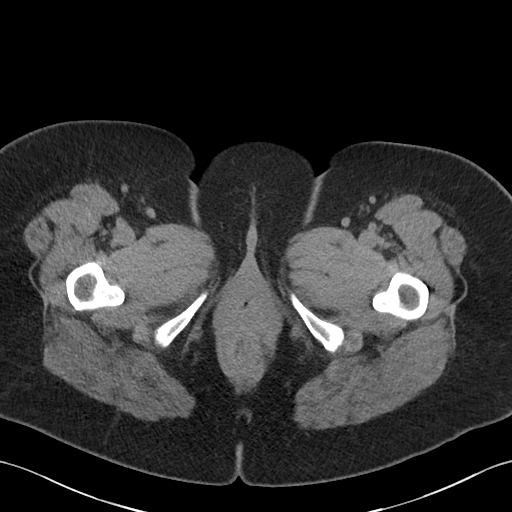
[im 9/103  bone]
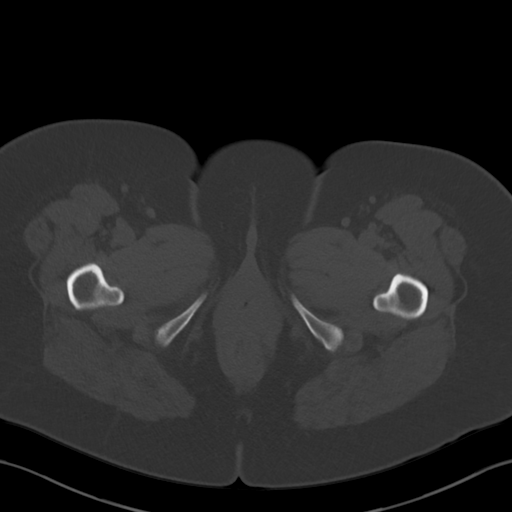
[im 17/103  soft-tissue]
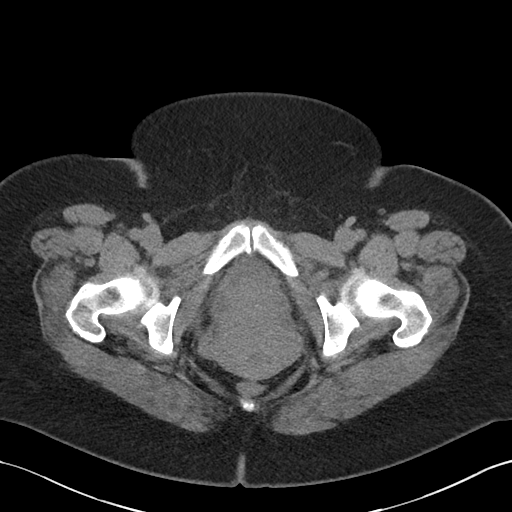
[im 25/103  soft-tissue]
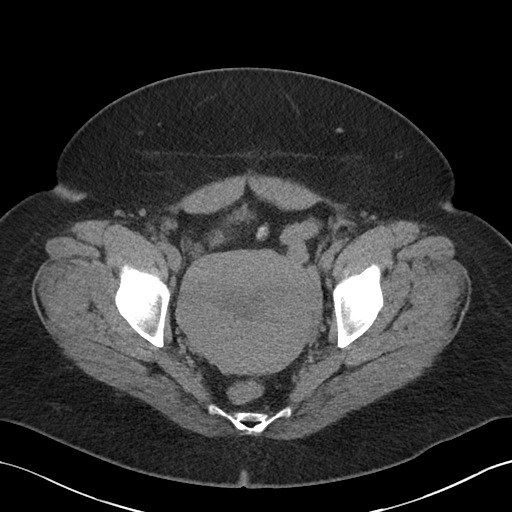
[im 33/103  soft-tissue]
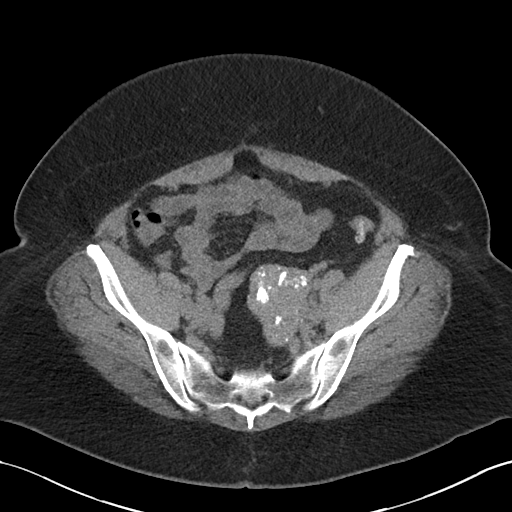
[im 41/103  soft-tissue]
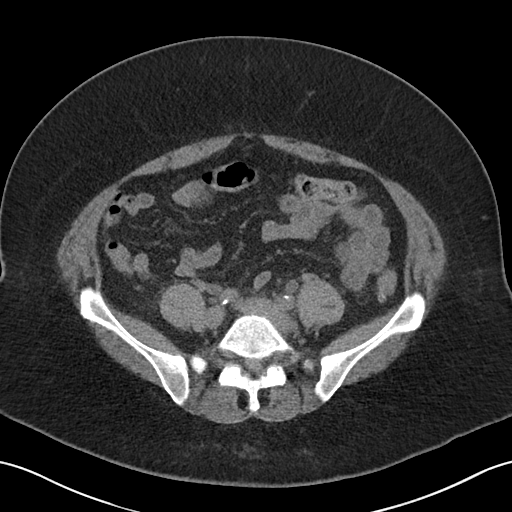
[im 49/103  soft-tissue]
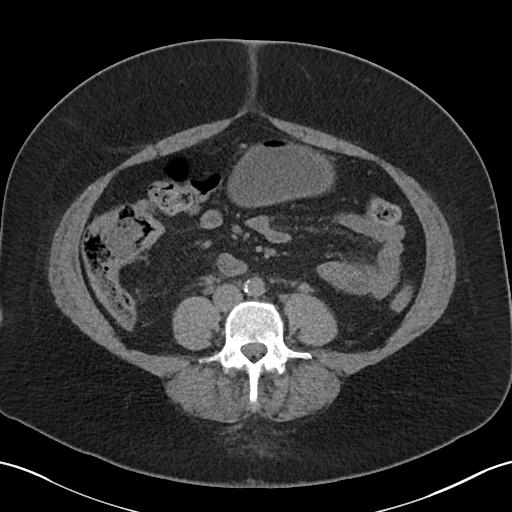
[im 58/103  soft-tissue]
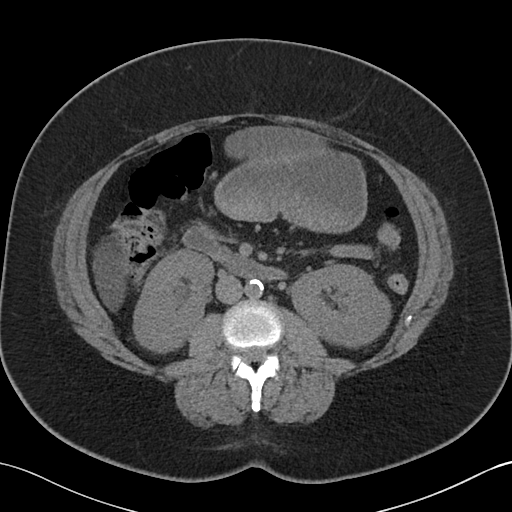
[im 66/103  soft-tissue]
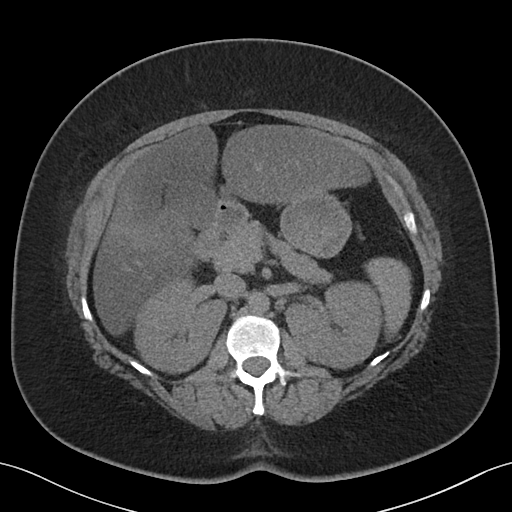
[im 74/103  soft-tissue]
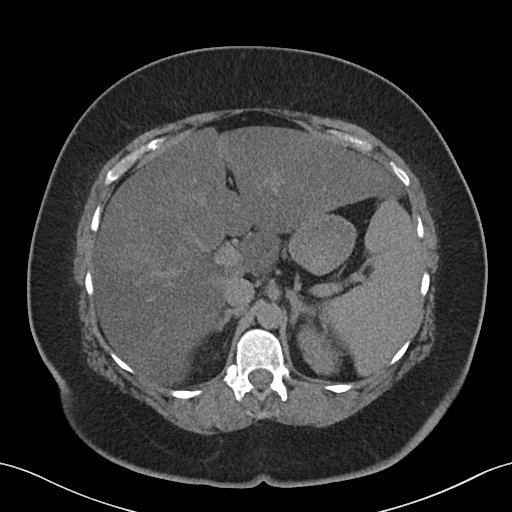
[im 74/103  bone]
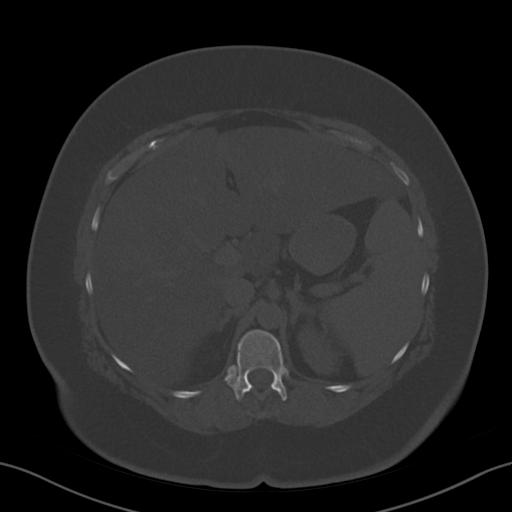
[im 82/103  soft-tissue]
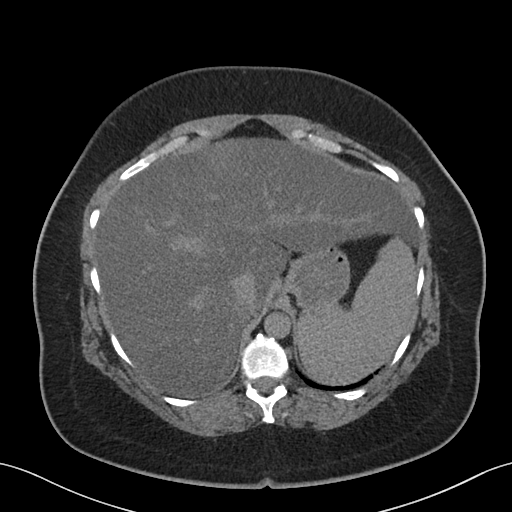
[im 90/103  soft-tissue]
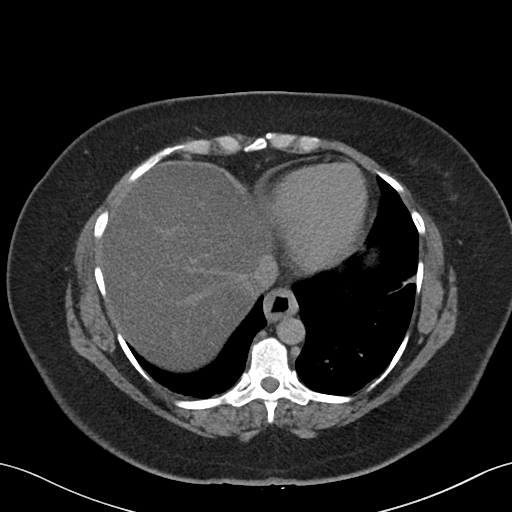
[im 98/103  soft-tissue]
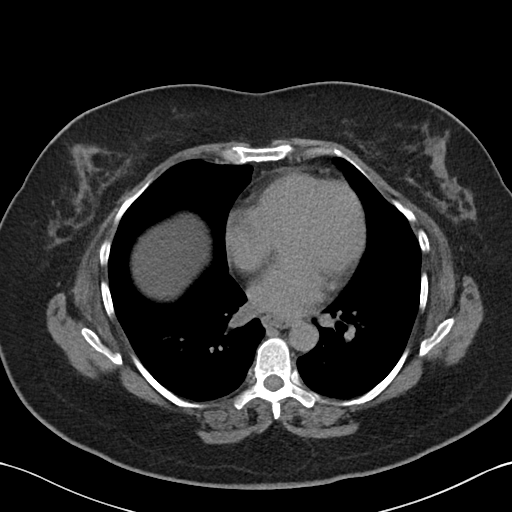

[Series 5: coronal · coronal · 0.93mm/px · 3 of 115 slices shown]
[im 39/115  soft-tissue]
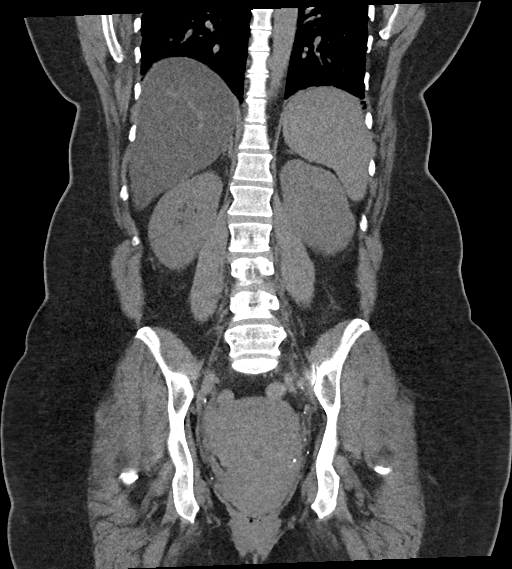
[im 51/115  soft-tissue]
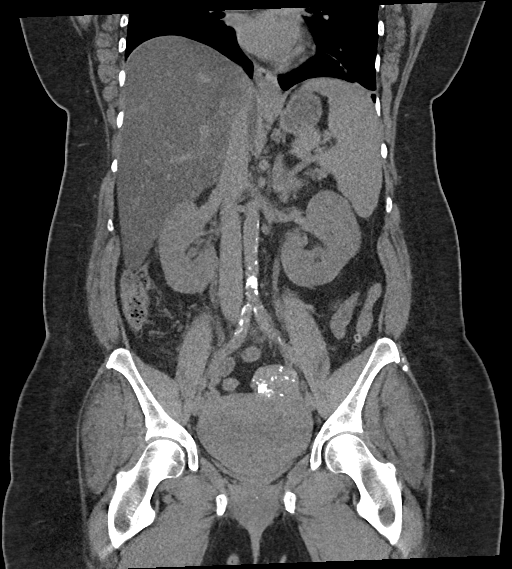
[im 64/115  soft-tissue]
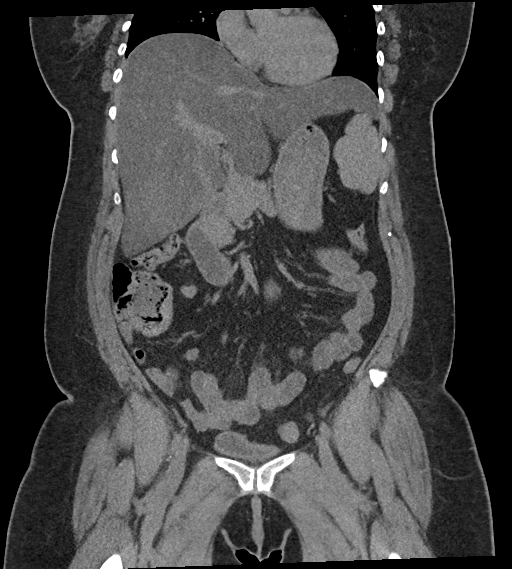

[15 of 46 positions shown; findings below may reference images not displayed]

FINDINGS: Lower chest: Mild linear bibasilar atelectasis. Heart size is
normal. Mild circumferential thickening of the distal esophagus.

Hepatobiliary: Severe fatty infiltration of the liver. Mild
hepatomegaly. No focal liver lesion is evident on unenhanced CT.
Multiple small stones layering within the gallbladder fundus. No
pericholecystic inflammatory changes by CT.

Pancreas: Unremarkable. No pancreatic ductal dilatation or
surrounding inflammatory changes.

Spleen: Spleen is upper limits of normal in size. No focal splenic
abnormality.

Adrenals/Urinary Tract: Unremarkable adrenal glands. Bilateral
kidneys within normal limits. No evidence of solid renal lesion,
stone, or hydronephrosis. No ureteral calculi identified. Urinary
bladder is decompressed, limiting its evaluation.

Stomach/Bowel: Stomach is within normal limits. Scattered colonic
diverticulosis. Appendix not definitively identified. No pericecal
inflammatory changes. No evidence of bowel wall thickening,
distention, or inflammatory changes.

Vascular/Lymphatic: Scattered aortoiliac atherosclerotic
calcifications without aneurysm. No abdominopelvic lymphadenopathy.

Reproductive: Globular enlargement of the uterus. Thickened
appearance of the endometrial stripe measuring approximately 2.0 cm.
Soft tissue density structure abutting the left uterine fundus
numerous dystrophic calcifications measuring 6.2 x 3.4 x 4.4 cm is
favored to represent a subserosal uterine fibroid. Left ovary is not
definitively seen. No right adnexal abnormality.

Other: No free fluid. No abdominopelvic fluid collection. No
pneumoperitoneum. No abdominal wall hernia.

Musculoskeletal: No acute or significant osseous findings.
IMPRESSION: 1. No acute abdominopelvic findings. Specifically, no evidence of
obstructive uropathy.
2. Severe fatty infiltration of the liver with mild hepatomegaly.
3. Cholelithiasis without evidence of acute cholecystitis.
4. Colonic diverticulosis without evidence of acute diverticulitis.
5. Mild circumferential thickening of the distal esophagus, which
may represent esophagitis.
6. Globular enlargement of the uterus with thickened appearance of
the endometrial stripe measuring approximately 2.0 cm. Further
evaluation with pelvic ultrasound is recommended on a non-emergent
basis.
7. Partially calcified soft tissue density structure abutting the
left uterine fundus measuring up to 6.2 cm is favored to represent a
subserosal uterine fibroid. This can also be further assessed on
previously recommended ultrasound.

Aortic Atherosclerosis ([HH]-[HH]).

## 2022-02-14 MED ORDER — SODIUM CHLORIDE 0.9 % IV SOLN
1.0000 g | INTRAVENOUS | Status: DC
  Administered 2022-02-15: 1 g via INTRAVENOUS
  Filled 2022-02-14: qty 10

## 2022-02-14 MED ORDER — FENTANYL CITRATE PF 50 MCG/ML IJ SOSY
PREFILLED_SYRINGE | INTRAMUSCULAR | Status: AC
  Filled 2022-02-14: qty 1

## 2022-02-14 MED ORDER — FENTANYL CITRATE PF 50 MCG/ML IJ SOSY
25.0000 ug | PREFILLED_SYRINGE | Freq: Once | INTRAMUSCULAR | Status: AC
  Administered 2022-02-14: 25 ug via INTRAVENOUS
  Filled 2022-02-14: qty 1

## 2022-02-14 MED ORDER — MORPHINE SULFATE (PF) 2 MG/ML IV SOLN
2.0000 mg | INTRAVENOUS | Status: DC | PRN
  Administered 2022-02-14: 2 mg via INTRAVENOUS
  Filled 2022-02-14: qty 1

## 2022-02-14 MED ORDER — METHYLPREDNISOLONE SODIUM SUCC 40 MG IJ SOLR
40.0000 mg | Freq: Two times a day (BID) | INTRAMUSCULAR | Status: DC
  Administered 2022-02-14 – 2022-02-15 (×2): 40 mg via INTRAVENOUS
  Filled 2022-02-14 (×2): qty 1

## 2022-02-14 MED ORDER — FENTANYL CITRATE PF 50 MCG/ML IJ SOSY
50.0000 ug | PREFILLED_SYRINGE | Freq: Once | INTRAMUSCULAR | Status: AC
  Administered 2022-02-14: 50 ug via INTRAVENOUS
  Filled 2022-02-14: qty 1

## 2022-02-14 MED ORDER — POTASSIUM CHLORIDE CRYS ER 20 MEQ PO TBCR
40.0000 meq | EXTENDED_RELEASE_TABLET | Freq: Once | ORAL | Status: AC
  Administered 2022-02-14: 40 meq via ORAL
  Filled 2022-02-14: qty 2

## 2022-02-14 MED ORDER — POLYETHYLENE GLYCOL 3350 17 G PO PACK: 17.0000 g | PACK | Freq: Every day | ORAL | Status: DC | PRN

## 2022-02-14 MED ORDER — METHOCARBAMOL 1000 MG/10ML IJ SOLN
500.0000 mg | Freq: Four times a day (QID) | INTRAVENOUS | Status: DC | PRN
  Administered 2022-02-15: 500 mg via INTRAVENOUS
  Filled 2022-02-14: qty 500
  Filled 2022-02-14: qty 5

## 2022-02-14 MED ORDER — DOCUSATE SODIUM 100 MG PO CAPS
100.0000 mg | ORAL_CAPSULE | Freq: Two times a day (BID) | ORAL | Status: DC
  Administered 2022-02-14 – 2022-02-15 (×2): 100 mg via ORAL
  Filled 2022-02-14 (×3): qty 1

## 2022-02-14 MED ORDER — CEFTRIAXONE SODIUM 1 G IJ SOLR
1.0000 g | Freq: Once | INTRAMUSCULAR | Status: AC
  Administered 2022-02-14: 1 g via INTRAVENOUS
  Filled 2022-02-14: qty 10

## 2022-02-14 MED ORDER — POTASSIUM CHLORIDE IN NACL 20-0.9 MEQ/L-% IV SOLN
INTRAVENOUS | Status: AC
  Filled 2022-02-14: qty 1000

## 2022-02-14 MED ORDER — INSULIN ASPART 100 UNIT/ML IJ SOLN
0.0000 [IU] | Freq: Every day | INTRAMUSCULAR | Status: DC
  Administered 2022-02-14 – 2022-02-15 (×2): 2 [IU] via SUBCUTANEOUS

## 2022-02-14 MED ORDER — SODIUM CHLORIDE 0.9 % IV SOLN: Freq: Once | INTRAVENOUS | Status: AC

## 2022-02-14 MED ORDER — SODIUM CHLORIDE 0.9 % IV BOLUS
1000.0000 mL | Freq: Once | INTRAVENOUS | Status: AC
  Administered 2022-02-14: 1000 mL via INTRAVENOUS

## 2022-02-14 MED ORDER — ENOXAPARIN SODIUM 30 MG/0.3ML IJ SOSY
30.0000 mg | PREFILLED_SYRINGE | INTRAMUSCULAR | Status: DC
  Administered 2022-02-14: 30 mg via SUBCUTANEOUS
  Filled 2022-02-14: qty 0.3

## 2022-02-14 MED ORDER — ONDANSETRON HCL 4 MG PO TABS: 4.0000 mg | ORAL_TABLET | Freq: Four times a day (QID) | ORAL | Status: DC | PRN

## 2022-02-14 MED ORDER — ONDANSETRON HCL 4 MG/2ML IJ SOLN
4.0000 mg | Freq: Four times a day (QID) | INTRAMUSCULAR | Status: DC | PRN
Start: 2022-02-14 — End: 2022-03-21
  Administered 2022-03-19: 4 mg via INTRAVENOUS
  Filled 2022-02-14 (×2): qty 2

## 2022-02-14 MED ORDER — LACTATED RINGERS IV SOLN: INTRAVENOUS | Status: DC

## 2022-02-14 MED ORDER — OXYCODONE HCL 5 MG PO TABS
5.0000 mg | ORAL_TABLET | ORAL | Status: DC | PRN
  Administered 2022-02-15 (×2): 5 mg via ORAL
  Filled 2022-02-14 (×2): qty 1

## 2022-02-14 MED ORDER — INSULIN ASPART 100 UNIT/ML IJ SOLN
0.0000 [IU] | Freq: Three times a day (TID) | INTRAMUSCULAR | Status: DC
  Administered 2022-02-15: 3 [IU] via SUBCUTANEOUS
  Administered 2022-02-15: 5 [IU] via SUBCUTANEOUS
  Administered 2022-02-15: 3 [IU] via SUBCUTANEOUS
  Administered 2022-02-16 (×2): 2 [IU] via SUBCUTANEOUS

## 2022-02-14 NOTE — H&P (Signed)
?History and Physical  ? ? ?Patient: Gabrielle Vincent ZWC:[id number] DOB: 07-29-1966 ?DOA: 02/14/2022 ?DOS: the patient was seen and examined on 02/14/2022 ?PCP: Pcp, No  ?Patient coming from: Home ? ?Chief Complaint:  ?Chief Complaint  ?Patient presents with  ? Back Pain  ? ?HPI: Gabrielle Vincent is a 56 y.o. female with medical history with seasonal allergies, obesity presented to ED with complaints of severe low back pain radiating down to her left lower extremity, associated with hematuria and dysuria. ?Patient reported that she has been having constant lower back pain, 10/10 over the weekend, progressively worsening, radiating down to her left leg.  No weakness in the legs however having difficulty ambulating due to pain.  She has not been eating too well, also noted urinary retention, decreased urination, hematuria and dysuria.  No acute fevers, no nausea vomiting or diarrhea. ?Patient went to the ED where labs were drawn which showed multiple electrolyte abnormalities and was referred for admission. ?Sodium 127, potassium 3.4, chloride 90, bicarb 18, creatinine 3.64, elevated LFTs, anion gap 19, WBCs 21.3, hemoglobin 15.9 ? ?CT renal stone study showed no obstructive uropathy, severe fatty infiltration of the liver with mild hepatomegaly.  Cholelithiasis with no acute cholecystitis.  Colonic diverticulosis.  Mild circumferential thickening of the distal esophagus may represent esophagitis. ?Global enlargement of the uterus with thickened appearance of endometrial stripe measuring 2.0 cm, subserosal uterine fibroid, recommended nonemergent pelvic ultrasound ? ? ?Review of Systems: As mentioned in the history of present illness. All other systems reviewed and are negative. ?Past Medical History:  ?Diagnosis Date  ? Aortic atherosclerosis ([place]) 02/14/2022  ? Class II obesity 02/14/2022  ? Diverticulosis 02/14/2022  ? Hepatic steatosis 02/14/2022  ? Hyperlipidemia 02/07/2016  ? Seasonal allergies   ? Swelling of lower extremity    ? bilateral  ? Varicose veins   ? right leg  ? ?Past Surgical History:  ?Procedure Laterality Date  ? lymphnode drainage surgery    ? ?Social History:  reports that she has never smoked. She does not have any smokeless tobacco history on file. She reports that she does not drink alcohol and does not use drugs. ? ?Allergies  ?Allergen Reactions  ? Erythromycin Anaphylaxis  ? Penicillins Nausea Only  ? Prednisone Other (See Comments)  ?  Lymph node swelling  ? ? ?History reviewed. No pertinent family history. ? ?Prior to Admission medications   ?Medication Sig Start Date End Date Taking? Authorizing Provider  ?ibuprofen (ADVIL) 200 MG tablet Take 200 mg by mouth every 6 (six) hours as needed.   Yes [provider]  ?diphenhydrAMINE (BENADRYL) 25 MG tablet Take 2 tablets (50 mg total) by mouth every 6 (six) hours. Take 1-2 tablets every 6 hours x 2 days, then space out to an as needed basis ?Patient not taking: Reported on 02/14/2022 01/30/16   Pixie Casino, MD  ?predniSONE (STERAPRED UNI-PAK 21 TAB) 10 MG (21) TBPK tablet Take 1 tablet (10 mg total) by mouth daily. Take 6 tabs by mouth daily  for 2 days, then 5 tabs for 2 days, then 4 tabs for 2 days, then 3 tabs for 2 days, 2 tabs for 2 days, then 1 tab by mouth daily for 2 days ?Patient not taking: Reported on 02/14/2022 01/30/16   Pixie Casino, MD  ?ranitidine (ZANTAC) 150 MG tablet Take 1 tablet (150 mg total) by mouth 2 (two) times daily. ?Patient not taking: Reported on 02/14/2022 01/30/16   Pixie Casino, MD  ? ? ?  Physical Exam: ?Vitals:  ? 02/14/22 1430 02/14/22 1530 02/14/22 1547 02/14/22 1646  ?BP: 123/79 121/80  (!) 139/105  ?Pulse: (!) 102 (!) 107  (!) 114  ?Resp: (!) 23 (!) 22  16  ?Temp:   97.8 ?F (36.6 ?C) (!) 97.4 ?F (36.3 ?C)  ?TempSrc:   Oral Oral  ?SpO2: 97% 94%  97%  ?Weight:      ?Height:      ? ? ?Physical Exam ?General: Alert and oriented x 3, NAD ?Cardiovascular: S1 S2 clear, RRR. No pedal edema b/l ?Respiratory: CTAB, no wheezing, rales  or rhonchi ?Gastrointestinal: Soft, nontender, nondistended, NBS ?Ext: no pedal edema bilaterally.  Mild point tenderness in the lower lumbar spine area ?Neuro: no new deficits ?Skin: No rashes ?Psych: Normal affect and demeanor, alert and oriented x3  ? ? ?Data Reviewed: ?RepeatingCBC, cmet  ? ?Sodium 127, potassium 3.4, chloride 90, bicarb 18, glucose 95, BUN 49, creatinine 3.64, anion gap 19 ? ?Alk phos 269, albumin 3.4, AST 94, ALT 58, total bilirubin 2.1 ? ?WBCs 21.3, hemoglobin 15.9, hematocrit 46.1 ? ? ?CT abdomen pelvis results mentioned in HPI ? ? ?Assessment and Plan: ? ?Acute severe lower back pain radiating to the L LE, radiculopathy ?-No known trauma or any fall.  Presented with low back pain acute, radiating down to her hips, lower abdomen and down her left leg, no significant improvement with NSAIDs ?-Obtain MRI of the lumbar spine, placed on IV morphine as needed for severe pain, oxycodone, Robaxin, IV Solu-Medrol ?-No Toradol or any NSAIDs due to acute renal insufficiency ?-PT OT consult in a.m. ? ? ? ?Acute kidney injury with anion gap metabolic acidosis ?-Patient reported nauseous and not eating or drinking much in the last few days ?-Creatinine 3.64, BUN 49, no prior baseline ?-Placed on IV fluid hydration, follow lactic acid ? ? ?Urinary tract infection ?-Follow urine culture and sensitivities, blood cultures, continue IV Rocephin ?-No obstructive uropathy or pyelonephritis/abscess on CT renal stone study ? ? ?Acute transaminitis ?-Unclear etiology ?-Repeat LFTs, acute hepatitis panel, obtain right upper quadrant ultrasound  ?-CT did show fatty liver, cholelithiasis but no acute cholecystitis ? ? ?Hyponatremia, hypokalemia ?-Sodium 127, potassium 3.4 likely due to dehydration, placed on IV fluid hydration, ?-Replace potassium p.o. ? ?Leukocytosis ?-Possibly due to UTI, follow urine cultures, blood cultures, procalcitonin, lactic acid ?-Continue IV Rocephin ? ? ?Hyperglycemia ?-CBGs 185 on the  labs ?-Patient reports no prior history of diabetes mellitus, obtain hemoglobin A1c ?-Placed on carb modified diet, sliding scale insulin ? ?Enlarged uterus, benign fibroid ?-CT results explained to the patient, she will follow-up outpatient with OB/GYN and nonemergent pelvic ultrasound for further work-up ? ? Advance Care Planning:   Code Status: Full Code discussed with the patient ? ?Consults: None ? ?Family Communication: No family member at the bedside ? ?Severity of Illness: ?The appropriate patient status for this patient is INPATIENT. Inpatient status is judged to be reasonable and necessary in order to provide the required intensity of service to ensure the patient's safety. The patient's presenting symptoms, physical exam findings, and initial radiographic and laboratory data in the context of their chronic comorbidities is felt to place them at high risk for further clinical deterioration. Furthermore, it is not anticipated that the patient will be medically stable for discharge from the hospital within 2 midnights of admission.  ? ?* I certify that at the point of admission it is my clinical judgment that the patient will require inpatient hospital care spanning beyond 2  midnights from the point of admission due to high intensity of service, high risk for further deterioration and high frequency of surveillance required.* ? ?Author: ?Estill Cotta, MD ?02/14/2022 5:32 PM ? ?For on call review [email or web address].  ?

## 2022-02-14 NOTE — ED Triage Notes (Signed)
Pt c/o of back constant pain that started over the weekend. Pt stated she has decreased in urination. Pt denis N/V.  ?

## 2022-02-14 NOTE — Progress Notes (Signed)
Plan of Care Note for accepted transfer ? ? ?Patient: Gabrielle Vincent MRN: [id number]   [place]: 02/14/2022 ? ?Facility requesting transfer:  ?[facility]. ?Requesting Provider:  ?Aletta Edouard, MD. 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  Her work-up also revealed leukocytosis, polycythemia, hyponatremia, hypokalemia, AKI with mild metabolic acidosis, hyperbilirubinemia and mild transaminitis.  No obvious acute etiology seen on CT scan, but has multiple incidental chronic findings.  She has been given normal saline 1000 mL IVPB, fentanyl 50 mcg IVP and ceftriaxone 1 g IVPB.  I have added NS plus KCl 20 mEq 1000 mL bolus over 2 hours. ? ?Plan of care: ?The patient is accepted for admission to Progressive unit, at [facility]. ? ?Author: ?Reubin Milan, MD ?02/14/2022 ? ?Check [email or web address] for on-call coverage. ? ?Nursing staff, Please call [place] number on Amion as soon as patient's arrival, so appropriate admitting provider can evaluate the pt. ?

## 2022-02-14 NOTE — ED Notes (Signed)
Attempted to call [facility] 4th floor for rm 40 x2 with no answer.  ?

## 2022-02-14 NOTE — Progress Notes (Signed)
?   02/14/22 1646  ?Assess: MEWS Score  ?Temp (!) 97.4 ?F (36.3 ?C)  ?BP (!) 139/105  ?Pulse Rate (!) 114  ?Resp 16  ?Level of Consciousness Alert  ?SpO2 97 %  ?Assess: MEWS Score  ?MEWS Temp 0  ?MEWS Systolic 0  ?MEWS Pulse 2  ?MEWS RR 0  ?MEWS LOC 0  ?MEWS Score 2  ?MEWS Score Color Yellow  ?Assess: if the MEWS score is Yellow or Red  ?Were vital signs taken at a resting state? Yes  ?Focused Assessment No change from prior assessment  ?Does the patient meet 2 or more of the SIRS criteria? No  ?MEWS guidelines implemented *See Row Information* Yes  ?Treat  ?Pain Scale 0-10  ?Pain Score 8  ?Take Vital Signs  ?Increase Vital Sign Frequency  Yellow: Q 2hr X 2 then Q 4hr X 2, if remains yellow, continue Q 4hrs  ?Escalate  ?MEWS: Escalate Yellow: discuss with charge nurse/RN and consider discussing with provider and RRT  ?Notify: Charge Nurse/RN  ?Name of Charge Nurse/RN Notified Abby  ?Date Charge Nurse/RN Notified 02/14/22  ?Time Charge Nurse/RN Notified 1646  ?Notify: Provider  ?Provider Name/Title Dr. Tana Coast  ?Date Provider Notified 02/14/22  ?Time Provider Notified [phone]  ?Notification Type Face-to-face  ?Notification Reason Change in status  ?Provider response At bedside  ?Date of Provider Response 02/14/22  ?Time of Provider Response 1646  ?Document  ?Patient Outcome Stabilized after interventions  ?Progress note created (see row info) Yes  ?Assess: SIRS CRITERIA  ?SIRS Temperature  0  ?SIRS Pulse 1  ?SIRS Respirations  0  ?SIRS WBC 0  ?SIRS Score Sum  1  ? ? ?

## 2022-02-14 NOTE — Progress Notes (Signed)
Patient arrived to room 1440 in NAD, VS stable. No call ever received to receive report on this patient. Patient oriented to room and call bell in reach.  ? ?

## 2022-02-14 NOTE — ED Provider Notes (Signed)
?[facility] EMERGENCY DEPT ?Provider Note ? ? ?CSN: [id number] ?Arrival date & time: 02/14/22  0945 ? ?  ? ?History ? ?Chief Complaint  ?Patient presents with  ? Back Pain  ? ? ?Gabrielle Vincent is a 56 y.o. female.  She does not have any significant medical history.  She is complaining of a few days of low back pain that radiates around her hips and low abdomen and down her left leg.  No known trauma.  Worse with bending and twisting.  She rates it as 10 out of 10.  Not responding to Aleve.  No numbness or weakness.  She is also noticed that she has been nauseous and not drinking or eating as much.  She also had some blood in her urine and she took an over-the-counter UTI medication for it.  No fevers.  No bowel or bladder incontinence. ? ?The history is provided by the patient.  ?Back Pain ?Location:  Lumbar spine ?Quality:  Aching ?Radiates to:  L thigh ?Pain severity:  Severe ?Pain is:  Same all the time ?Onset quality:  Gradual ?Duration:  4 days ?Timing:  Constant ?Progression:  Unchanged ?Chronicity:  New ?Context: not recent injury   ?Relieved by:  Nothing ?Worsened by:  Movement ?Ineffective treatments:  Bed rest ?Associated symptoms: abdominal pain and leg pain   ?Associated symptoms: no bladder incontinence, no bowel incontinence, no chest pain, no dysuria, no fever, no numbness and no weakness   ? ?  ? ?Home Medications ?Prior to Admission medications   ?Medication Sig Start Date End Date Taking? Authorizing Provider  ?diphenhydrAMINE (BENADRYL) 25 MG tablet Take 2 tablets (50 mg total) by mouth every 6 (six) hours. Take 1-2 tablets every 6 hours x 2 days, then space out to an as needed basis 01/30/16   Pixie Casino, MD  ?predniSONE (STERAPRED UNI-PAK 21 TAB) 10 MG (21) TBPK tablet Take 1 tablet (10 mg total) by mouth daily. Take 6 tabs by mouth daily  for 2 days, then 5 tabs for 2 days, then 4 tabs for 2 days, then 3 tabs for 2 days, 2 tabs for 2 days, then 1 tab by mouth daily for 2 days  01/30/16   Pixie Casino, MD  ?ranitidine (ZANTAC) 150 MG tablet Take 1 tablet (150 mg total) by mouth 2 (two) times daily. 01/30/16   Mabe, Forbes Cellar, MD  ?   ? ?Allergies    ?Erythromycin, Penicillins, and Prednisone   ? ?Review of Systems   ?Review of Systems  ?Constitutional:  Negative for fever.  ?HENT:  Negative for sore throat.   ?Respiratory:  Negative for shortness of breath.   ?Cardiovascular:  Negative for chest pain.  ?Gastrointestinal:  Positive for abdominal pain. Negative for bowel incontinence.  ?Genitourinary:  Positive for hematuria. Negative for bladder incontinence and dysuria.  ?Musculoskeletal:  Positive for back pain.  ?Skin:  Negative for rash.  ?Neurological:  Negative for weakness and numbness.  ? ?Physical Exam ?Updated Vital Signs ?BP 117/88 (BP Location: Right Arm)   Pulse (!) 115   Temp 97.8 ?F (36.6 ?C) (Oral)   Ht '5\' 4"'$  (1.626 m)   Wt 95.3 kg   LMP  (LMP Unknown)   SpO2 92%   BMI 36.05 kg/m?  ?Physical Exam ?Vitals and nursing note reviewed.  ?Constitutional:   ?   General: She is not in acute distress. ?   Appearance: Normal appearance. She is well-developed. She is obese.  ?HENT:  ?  Head: Normocephalic and atraumatic.  ?Eyes:  ?   Conjunctiva/sclera: Conjunctivae normal.  ?Cardiovascular:  ?   Rate and Rhythm: Normal rate and regular rhythm.  ?   Heart sounds: No murmur heard. ?Pulmonary:  ?   Effort: Pulmonary effort is normal. No respiratory distress.  ?   Breath sounds: Normal breath sounds.  ?Abdominal:  ?   Palpations: Abdomen is soft.  ?   Tenderness: There is no abdominal tenderness. There is no guarding or rebound.  ?Musculoskeletal:     ?   General: Tenderness present. No swelling.  ?   Cervical back: Neck supple.  ?   Comments: She is tender left paralumbar area.  No left CVA tenderness and no midline tenderness.  ?Skin: ?   General: Skin is warm and dry.  ?   Capillary Refill: Capillary refill takes less than 2 seconds.  ?Neurological:  ?   General: No focal  deficit present.  ?   Mental Status: She is alert.  ?   Sensory: No sensory deficit.  ?   Motor: No weakness.  ? ? ?ED Results / Procedures / Treatments   ?Labs ?(all labs ordered are listed, but only abnormal results are displayed) ?Labs Reviewed  ?URINALYSIS, ROUTINE W REFLEX MICROSCOPIC - Abnormal; Notable for the following components:  ?    Result Value  ? APPearance CLOUDY (*)   ? Hgb urine dipstick LARGE (*)   ? Protein, ur 100 (*)   ? Leukocytes,Ua LARGE (*)   ? RBC / HPF >50 (*)   ? WBC, UA >50 (*)   ? Bacteria, UA MANY (*)   ? Non Squamous Epithelial 0-5 (*)   ? All other components within normal limits  ?CBC WITH DIFFERENTIAL/PLATELET - Abnormal; Notable for the following components:  ? WBC 21.3 (*)   ? RBC 5.52 (*)   ? Hemoglobin 15.9 (*)   ? HCT 46.1 (*)   ? RDW 15.9 (*)   ? Platelets 106 (*)   ? Neutro Abs 18.0 (*)   ? Monocytes Absolute 1.4 (*)   ? Abs Immature Granulocytes 0.93 (*)   ? All other components within normal limits  ?COMPREHENSIVE METABOLIC PANEL - Abnormal; Notable for the following components:  ? Sodium 127 (*)   ? Potassium 3.4 (*)   ? Chloride 90 (*)   ? CO2 18 (*)   ? Glucose, Bld 185 (*)   ? BUN 49 (*)   ? Creatinine, Ser 3.64 (*)   ? Albumin 3.4 (*)   ? AST 94 (*)   ? ALT 58 (*)   ? Alkaline Phosphatase 269 (*)   ? Total Bilirubin 2.1 (*)   ? GFR, Estimated 14 (*)   ? Anion gap 19 (*)   ? All other components within normal limits  ?COMPREHENSIVE METABOLIC PANEL - Abnormal; Notable for the following components:  ? Sodium 129 (*)   ? Potassium 3.4 (*)   ? CO2 15 (*)   ? Glucose, Bld 168 (*)   ? BUN 52 (*)   ? Creatinine, Ser 3.30 (*)   ? Calcium 7.8 (*)   ? Albumin 2.6 (*)   ? AST 122 (*)   ? ALT 66 (*)   ? Alkaline Phosphatase 256 (*)   ? Total Bilirubin 2.3 (*)   ? GFR, Estimated 16 (*)   ? All other components within normal limits  ?CBC - Abnormal; Notable for the following components:  ? WBC 16.1 (*)   ?  RBC 5.19 (*)   ? Hemoglobin 15.2 (*)   ? RDW 15.9 (*)   ? Platelets 101 (*)    ? All other components within normal limits  ?COMPREHENSIVE METABOLIC PANEL - Abnormal; Notable for the following components:  ? Sodium 131 (*)   ? CO2 16 (*)   ? Glucose, Bld 249 (*)   ? BUN 52 (*)   ? Creatinine, Ser 2.78 (*)   ? Calcium 8.2 (*)   ? Albumin 2.5 (*)   ? AST 118 (*)   ? ALT 72 (*)   ? Alkaline Phosphatase 235 (*)   ? Total Bilirubin 2.3 (*)   ? GFR, Estimated 19 (*)   ? All other components within normal limits  ?CBC - Abnormal; Notable for the following components:  ? WBC 23.3 (*)   ? RBC 5.61 (*)   ? Hemoglobin 16.2 (*)   ? HCT 48.9 (*)   ? RDW 16.5 (*)   ? Platelets 84 (*)   ? All other components within normal limits  ?HEMOGLOBIN A1C - Abnormal; Notable for the following components:  ? Hgb A1c MFr Bld 8.9 (*)   ? All other components within normal limits  ?GLUCOSE, CAPILLARY - Abnormal; Notable for the following components:  ? Glucose-Capillary 168 (*)   ? All other components within normal limits  ?GLUCOSE, CAPILLARY - Abnormal; Notable for the following components:  ? Glucose-Capillary 247 (*)   ? All other components within normal limits  ?PROTIME-INR - Abnormal; Notable for the following components:  ? Prothrombin Time 15.7 (*)   ? INR 1.3 (*)   ? All other components within normal limits  ?GLUCOSE, CAPILLARY - Abnormal; Notable for the following components:  ? Glucose-Capillary 203 (*)   ? All other components within normal limits  ?URINE CULTURE  ?LIPASE, BLOOD  ?LACTIC ACID, PLASMA  ?PROCALCITONIN  ?HEPATITIS PANEL, ACUTE  ?PROCALCITONIN  ?HIV ANTIBODY (ROUTINE TESTING W REFLEX)  ? ? ?EKG ?None ? ?Radiology ?MR LUMBAR SPINE WO CONTRAST ? ?Result Date: 02/14/2022 ?CLINICAL DATA:  Lumbar myelopathy EXAM: MRI LUMBAR SPINE WITHOUT CONTRAST TECHNIQUE: Multiplanar, multisequence MR imaging of the lumbar spine was performed. No intravenous contrast was administered. COMPARISON:  None Available. FINDINGS: Segmentation:  Standard. Alignment:  Physiologic. Vertebrae:  No fracture, evidence of  discitis, or bone lesion. Conus medullaris and cauda equina: Conus extends to the L1 level. Conus and cauda equina appear normal. Paraspinal and other soft tissues: Negative. Disc levels: There is dorsal epidural lipomatos

## 2022-02-15 ENCOUNTER — Inpatient Hospital Stay (HOSPITAL_COMMUNITY): Payer: BC Managed Care – PPO

## 2022-02-15 DIAGNOSIS — N179 Acute kidney failure, unspecified: Secondary | ICD-10-CM | POA: Diagnosis not present

## 2022-02-15 DIAGNOSIS — R7989 Other specified abnormal findings of blood chemistry: Secondary | ICD-10-CM | POA: Diagnosis not present

## 2022-02-15 DIAGNOSIS — M5440 Lumbago with sciatica, unspecified side: Secondary | ICD-10-CM | POA: Diagnosis not present

## 2022-02-15 DIAGNOSIS — A419 Sepsis, unspecified organism: Secondary | ICD-10-CM | POA: Diagnosis not present

## 2022-02-15 LAB — COMPREHENSIVE METABOLIC PANEL
ALT: 72 U/L — ABNORMAL HIGH (ref 0–44)
AST: 118 U/L — ABNORMAL HIGH (ref 15–41)
Albumin: 2.5 g/dL — ABNORMAL LOW (ref 3.5–5.0)
Alkaline Phosphatase: 235 U/L — ABNORMAL HIGH (ref 38–126)
Anion gap: 11 (ref 5–15)
BUN: 52 mg/dL — ABNORMAL HIGH (ref 6–20)
CO2: 16 mmol/L — ABNORMAL LOW (ref 22–32)
Calcium: 8.2 mg/dL — ABNORMAL LOW (ref 8.9–10.3)
Chloride: 104 mmol/L (ref 98–111)
Creatinine, Ser: 2.78 mg/dL — ABNORMAL HIGH (ref 0.44–1.00)
GFR, Estimated: 19 mL/min — ABNORMAL LOW (ref >60)
Glucose, Bld: 249 mg/dL — ABNORMAL HIGH (ref 70–99)
Potassium: 3.8 mmol/L (ref 3.5–5.1)
Sodium: 131 mmol/L — ABNORMAL LOW (ref 135–145)
Total Bilirubin: 2.3 mg/dL — ABNORMAL HIGH (ref 0.3–1.2)
Total Protein: 6.6 g/dL (ref 6.5–8.1)

## 2022-02-15 LAB — GLUCOSE, CAPILLARY
Glucose-Capillary: 203 mg/dL — ABNORMAL HIGH (ref 70–99)
Glucose-Capillary: 240 mg/dL — ABNORMAL HIGH (ref 70–99)
Glucose-Capillary: 262 mg/dL — ABNORMAL HIGH (ref 70–99)
Glucose-Capillary: 251 mg/dL — ABNORMAL HIGH (ref 70–99)
Glucose-Capillary: 248 mg/dL — ABNORMAL HIGH (ref 70–99)

## 2022-02-15 LAB — HIV ANTIBODY (ROUTINE TESTING W REFLEX): HIV Screen 4th Generation wRfx: NONREACTIVE

## 2022-02-15 LAB — CBC
HCT: 48.9 % — ABNORMAL HIGH (ref 36.0–46.0)
Hemoglobin: 16.2 g/dL — ABNORMAL HIGH (ref 12.0–15.0)
MCH: 28.9 pg (ref 26.0–34.0)
MCHC: 33.1 g/dL (ref 30.0–36.0)
MCV: 87.2 fL (ref 80.0–100.0)
Platelets: 84 10*3/uL — ABNORMAL LOW (ref 150–400)
RBC: 5.61 MIL/uL — ABNORMAL HIGH (ref 3.87–5.11)
RDW: 16.5 % — ABNORMAL HIGH (ref 11.5–15.5)
WBC: 23.3 10*3/uL — ABNORMAL HIGH (ref 4.0–10.5)
nRBC: 0 % (ref 0.0–0.2)

## 2022-02-15 LAB — HEMOGLOBIN A1C
Hgb A1c MFr Bld: 8.9 % — ABNORMAL HIGH (ref 4.8–5.6)
Mean Plasma Glucose: 208.73 mg/dL

## 2022-02-15 LAB — PROTIME-INR
INR: 1.3 — ABNORMAL HIGH (ref 0.8–1.2)
Prothrombin Time: 15.7 seconds — ABNORMAL HIGH (ref 11.4–15.2)

## 2022-02-15 LAB — PROCALCITONIN: Procalcitonin: 24.68 ng/mL

## 2022-02-15 LAB — AMMONIA: Ammonia: 36 umol/L — ABNORMAL HIGH (ref 9–35)

## 2022-02-15 MED ORDER — ASPIRIN EC 325 MG PO TBEC
325.0000 mg | DELAYED_RELEASE_TABLET | Freq: Every day | ORAL | Status: DC
  Administered 2022-02-16: 325 mg via ORAL
  Filled 2022-02-15 (×2): qty 1

## 2022-02-15 MED ORDER — LORAZEPAM 2 MG/ML IJ SOLN
1.0000 mg | Freq: Once | INTRAMUSCULAR | Status: AC
  Administered 2022-02-15: 1 mg via INTRAVENOUS
  Filled 2022-02-15: qty 1

## 2022-02-15 MED ORDER — ACETAMINOPHEN 650 MG RE SUPP
650.0000 mg | Freq: Once | RECTAL | Status: AC
  Administered 2022-02-15: 650 mg via RECTAL
  Filled 2022-02-15: qty 1

## 2022-02-15 MED ORDER — LIVING WELL WITH DIABETES BOOK
Freq: Once | Status: AC
  Filled 2022-02-15: qty 1

## 2022-02-15 MED ORDER — OXYCODONE HCL 5 MG PO TABS
5.0000 mg | ORAL_TABLET | Freq: Four times a day (QID) | ORAL | Status: DC | PRN
  Administered 2022-03-04 (×2): 5 mg via ORAL
  Filled 2022-02-15 (×3): qty 1

## 2022-02-15 MED ORDER — URELLE 81 MG PO TABS
1.0000 | ORAL_TABLET | Freq: Three times a day (TID) | ORAL | Status: DC
  Administered 2022-02-15 – 2022-02-16 (×2): 81 mg via ORAL
  Filled 2022-02-15 (×5): qty 1

## 2022-02-15 MED ORDER — PREGABALIN 25 MG PO CAPS
25.0000 mg | ORAL_CAPSULE | Freq: Two times a day (BID) | ORAL | Status: DC
  Administered 2022-02-15: 25 mg via ORAL
  Filled 2022-02-15: qty 1

## 2022-02-15 MED ORDER — LIDOCAINE HCL (PF) 1 % IJ SOLN
INTRAMUSCULAR | Status: AC
  Filled 2022-02-15: qty 30

## 2022-02-15 MED ORDER — METHYLPREDNISOLONE ACETATE 80 MG/ML IJ SUSP
INTRAMUSCULAR | Status: AC
  Filled 2022-02-15: qty 1

## 2022-02-15 MED ORDER — METOPROLOL TARTRATE 5 MG/5ML IV SOLN
2.5000 mg | Freq: Once | INTRAVENOUS | Status: AC
  Administered 2022-02-15: 2.5 mg via INTRAVENOUS
  Filled 2022-02-15: qty 5

## 2022-02-15 MED ORDER — METOPROLOL TARTRATE 25 MG PO TABS
12.5000 mg | ORAL_TABLET | Freq: Two times a day (BID) | ORAL | Status: DC
  Administered 2022-02-15 – 2022-02-16 (×2): 12.5 mg via ORAL
  Filled 2022-02-15 (×2): qty 1

## 2022-02-15 MED ORDER — SODIUM CHLORIDE (PF) 0.9 % IJ SOLN
INTRAMUSCULAR | Status: AC
  Filled 2022-02-15: qty 10

## 2022-02-15 MED ORDER — PANTOPRAZOLE SODIUM 40 MG PO TBEC
40.0000 mg | DELAYED_RELEASE_TABLET | Freq: Every day | ORAL | Status: DC
  Administered 2022-02-15 – 2022-02-16 (×2): 40 mg via ORAL
  Filled 2022-02-15 (×2): qty 1

## 2022-02-15 MED ORDER — LORAZEPAM 1 MG PO TABS: 1.0000 mg | ORAL_TABLET | Freq: Once | ORAL | Status: DC

## 2022-02-15 MED ORDER — MORPHINE SULFATE (PF) 2 MG/ML IV SOLN: 1.0000 mg | INTRAVENOUS | Status: DC | PRN

## 2022-02-15 MED ORDER — SODIUM CHLORIDE 0.9 % IV SOLN: INTRAVENOUS | Status: DC

## 2022-02-15 MED ORDER — IOPAMIDOL (ISOVUE-M 200) INJECTION 41%
INTRAMUSCULAR | Status: AC
  Filled 2022-02-15: qty 10

## 2022-02-15 MED ORDER — METHYLPREDNISOLONE SODIUM SUCC 40 MG IJ SOLR: 40.0000 mg | Freq: Every day | INTRAMUSCULAR | Status: DC

## 2022-02-15 MED ORDER — FENTANYL CITRATE PF 50 MCG/ML IJ SOSY
25.0000 ug | PREFILLED_SYRINGE | INTRAMUSCULAR | Status: DC | PRN
  Administered 2022-02-15 – 2022-03-05 (×8): 25 ug via INTRAVENOUS
  Filled 2022-02-15 (×9): qty 1

## 2022-02-15 NOTE — Progress Notes (Signed)
Dr. Tana Coast aware that the patient is still very lethargic after receiving the IV Ativan. The patient is just lying in the bed tossing and turning and moaning. We will continue to monitor closely.  ?

## 2022-02-15 NOTE — Progress Notes (Addendum)
Dr. Tana Coast came to bedside to evaluate patient due to increased confusion and restlessness. '1mg'$  of IV Ativan administered.  ?

## 2022-02-15 NOTE — Consult Note (Addendum)
Reason for Consult: Herniated nucleus pulposus ?Referring Physician: Dr. Tana Coast ? ?Gabrielle Vincent is an 56 y.o. female.  ?HPI: Patient is a 56 year old female with severe left lower extremity pain who was hospitalized with an acute UTI urinary retention and new onset diabetes mellitus.  She was complaining of severe left lower extremity pain and spasm and an MRI was ordered.  This study demonstrates presence of a small herniated nucleus pulposus at L3-L4 on the left side.  Patient cannot take nonsteroidal anti-inflammatories and pain management has been with opioids.  She has been immobile because of the pain and I was consulted regarding further specific intervention. ? ?Past Medical History:  ?Diagnosis Date  ? Aortic atherosclerosis ([place]) 02/14/2022  ? Class II obesity 02/14/2022  ? Diverticulosis 02/14/2022  ? Hepatic steatosis 02/14/2022  ? Hyperlipidemia 02/07/2016  ? Seasonal allergies   ? Swelling of lower extremity   ? bilateral  ? Varicose veins   ? right leg  ? ? ?Past Surgical History:  ?Procedure Laterality Date  ? lymphnode drainage surgery    ? ? ?History reviewed. No pertinent family history. ? ?Social History:  reports that she has never smoked. She does not have any smokeless tobacco history on file. She reports that she does not drink alcohol and does not use drugs. ? ?Allergies:  ?Allergies  ?Allergen Reactions  ? Erythromycin Anaphylaxis  ? Penicillins Nausea Only  ? Prednisone Other (See Comments)  ?  Lymph node swelling  ? ? ?Medications: I have reviewed the patient's current medications. ? ?Results for orders placed or performed during the hospital encounter of 02/14/22 (from the past 48 hour(s))  ?Urinalysis, Routine w reflex microscopic Urine, Clean Catch     Status: Abnormal  ? Collection Time: 02/14/22 10:40 AM  ?Result Value Ref Range  ? Color, Urine YELLOW YELLOW  ? APPearance CLOUDY (A) CLEAR  ? Specific Gravity, Urine 1.012 1.005 - 1.030  ? pH 5.5 5.0 - 8.0  ? Glucose, UA NEGATIVE NEGATIVE mg/dL   ? Hgb urine dipstick LARGE (A) NEGATIVE  ? Bilirubin Urine NEGATIVE NEGATIVE  ? Ketones, ur NEGATIVE NEGATIVE mg/dL  ? Protein, ur 100 (A) NEGATIVE mg/dL  ? Nitrite NEGATIVE NEGATIVE  ? Leukocytes,Ua LARGE (A) NEGATIVE  ? RBC / HPF >50 (H) 0 - 5 RBC/hpf  ? WBC, UA >50 (H) 0 - 5 WBC/hpf  ? Bacteria, UA MANY (A) NONE SEEN  ? Squamous Epithelial / LPF 0-5 0 - 5  ? WBC Clumps PRESENT   ? Mucus PRESENT   ? Non Squamous Epithelial 0-5 (A) NONE SEEN  ?  Comment: Performed at [facility], [address] [place], [place] [address]  ?CBC with Differential     Status: Abnormal  ? Collection Time: 02/14/22 10:40 AM  ?Result Value Ref Range  ? WBC 21.3 (H) 4.0 - 10.5 K/uL  ? RBC 5.52 (H) 3.87 - 5.11 MIL/uL  ? Hemoglobin 15.9 (H) 12.0 - 15.0 g/dL  ? HCT 46.1 (H) 36.0 - 46.0 %  ? MCV 83.5 80.0 - 100.0 fL  ? MCH 28.8 26.0 - 34.0 pg  ? MCHC 34.5 30.0 - 36.0 g/dL  ? RDW 15.9 (H) 11.5 - 15.5 %  ? Platelets 106 (L) 150 - 400 K/uL  ?  Comment: Immature Platelet Fraction may be ?clinically indicated, consider ?ordering this additional test ?[id number] ?  ? nRBC 0.0 0.0 - 0.2 %  ? Neutrophils Relative % 85 %  ? Neutro Abs 18.0 (H) 1.7 -  7.7 K/uL  ? Lymphocytes Relative 4 %  ? Lymphs Abs 0.9 0.7 - 4.0 K/uL  ? Monocytes Relative 7 %  ? Monocytes Absolute 1.4 (H) 0.1 - 1.0 K/uL  ? Eosinophils Relative 0 %  ? Eosinophils Absolute 0.0 0.0 - 0.5 K/uL  ? Basophils Relative 0 %  ? Basophils Absolute 0.0 0.0 - 0.1 K/uL  ? Immature Granulocytes 4 %  ? Abs Immature Granulocytes 0.93 (H) 0.00 - 0.07 K/uL  ?  Comment: Performed at [facility], [address] [place], [place] [address]  ?Comprehensive metabolic panel     Status: Abnormal  ? Collection Time: 02/14/22 10:40 AM  ?Result Value Ref Range  ? Sodium 127 (L) 135 - 145 mmol/L  ? Potassium 3.4 (L) 3.5 - 5.1 mmol/L  ? Chloride 90 (L) 98 - 111 mmol/L  ? CO2 18 (L) 22 - 32 mmol/L  ? Glucose, Bld 185 (H) 70 - 99 mg/dL  ?  Comment: Glucose reference range  applies only to samples taken after fasting for at least 8 hours.  ? BUN 49 (H) 6 - 20 mg/dL  ? Creatinine, Ser 3.64 (H) 0.44 - 1.00 mg/dL  ? Calcium 9.4 8.9 - 10.3 mg/dL  ? Total Protein 7.3 6.5 - 8.1 g/dL  ? Albumin 3.4 (L) 3.5 - 5.0 g/dL  ? AST 94 (H) 15 - 41 U/L  ? ALT 58 (H) 0 - 44 U/L  ? Alkaline Phosphatase 269 (H) 38 - 126 U/L  ? Total Bilirubin 2.1 (H) 0.3 - 1.2 mg/dL  ? GFR, Estimated 14 (L) >60 mL/min  ?  Comment: (NOTE) ?Calculated using the CKD-EPI Creatinine Equation (2021) ?  ? Anion gap 19 (H) 5 - 15  ?  Comment: Performed at [facility], [address], [place], [place] [address]  ?Lipase, blood     Status: None  ? Collection Time: 02/14/22 10:40 AM  ?Result Value Ref Range  ? Lipase 23 11 - 51 U/L  ?  Comment: Performed at [facility], [address], [place], [place] [address]  ?Comprehensive metabolic panel     Status: Abnormal  ? Collection Time: 02/14/22  5:19 PM  ?Result Value Ref Range  ? Sodium 129 (L) 135 - 145 mmol/L  ? Potassium 3.4 (L) 3.5 - 5.1 mmol/L  ? Chloride 100 98 - 111 mmol/L  ? CO2 15 (L) 22 - 32 mmol/L  ? Glucose, Bld 168 (H) 70 - 99 mg/dL  ?  Comment: Glucose reference range applies only to samples taken after fasting for at least 8 hours.  ? BUN 52 (H) 6 - 20 mg/dL  ? Creatinine, Ser 3.30 (H) 0.44 - 1.00 mg/dL  ? Calcium 7.8 (L) 8.9 - 10.3 mg/dL  ? Total Protein 6.8 6.5 - 8.1 g/dL  ? Albumin 2.6 (L) 3.5 - 5.0 g/dL  ? AST 122 (H) 15 - 41 U/L  ? ALT 66 (H) 0 - 44 U/L  ? Alkaline Phosphatase 256 (H) 38 - 126 U/L  ? Total Bilirubin 2.3 (H) 0.3 - 1.2 mg/dL  ? GFR, Estimated 16 (L) >60 mL/min  ?  Comment: (NOTE) ?Calculated using the CKD-EPI Creatinine Equation (2021) ?  ? Anion gap 14 5 - 15  ?  Comment: Performed at [facility], [place] [address] [place], [place] [address]  ?CBC     Status: Abnormal  ? Collection Time: 02/14/22  5:19 PM  ?Result Value Ref Range  ? WBC 16.1 (  H) 4.0 - 10.5 K/uL  ? RBC 5.19 (H) 3.87 - 5.11  MIL/uL  ? Hemoglobin 15.2 (H) 12.0 - 15.0 g/dL  ? HCT 44.1 36.0 - 46.0 %  ? MCV 85.0 80.0 - 100.0 fL  ? MCH 29.3 26.0 - 34.0 pg  ? MCHC 34.5 30.0 - 36.0 g/dL  ? RDW 15.9 (H) 11.5 - 15.5 %  ? Platelets 101 (L) 150 - 400 K/uL  ?  Comment: SPECIMEN CHECKED FOR CLOTS ?Immature Platelet Fraction may be ?clinically indicated, consider ?ordering this additional test ?[id number] ?REPEATED TO VERIFY ?PLATELET COUNT CONFIRMED BY SMEAR ?  ? nRBC 0.0 0.0 - 0.2 %  ?  Comment: Performed at [facility], [place] [address], [place], [place] [address]  ?Hemoglobin A1c     Status: Abnormal  ? Collection Time: 02/14/22  5:19 PM  ?Result Value Ref Range  ? Hgb A1c MFr Bld 8.9 (H) 4.8 - 5.6 %  ?  Comment: (NOTE) ?Pre diabetes:          5.7%-6.4% ? ?Diabetes:              >6.4% ? ?Glycemic control for   <7.0% ?adults with diabetes ?  ? Mean Plasma Glucose 208.73 mg/dL  ?  Comment: Performed at [facility], [place] [address], [place], [place] [address]  ?Glucose, capillary     Status: Abnormal  ? Collection Time: 02/14/22  5:50 PM  ?Result Value Ref Range  ? Glucose-Capillary 168 (H) 70 - 99 mg/dL  ?  Comment: Glucose reference range applies only to samples taken after fasting for at least 8 hours.  ?Lactic acid, plasma     Status: None  ? Collection Time: 02/14/22  6:20 PM  ?Result Value Ref Range  ? Lactic Acid, Venous 1.9 0.5 - 1.9 mmol/L  ?  Comment: Performed at [facility], [place] [address], [place], [place] [address]  ?Procalcitonin - Baseline     Status: None  ? Collection Time: 02/14/22  6:20 PM  ?Result Value Ref Range  ? Procalcitonin 35.18 ng/mL  ?  Comment:        ?Interpretation: ?PCT >= 10 ng/mL: ?Important systemic inflammatory response, ?almost exclusively due to severe bacterial ?sepsis or septic shock. ?(NOTE) ?      Sepsis PCT Algorithm           Lower Respiratory Tract ?                                     Infection PCT Algorithm ?   ----------------------------      ---------------------------- ?        PCT < 0.25 ng/mL                PCT < 0.10 ng/mL ? ?        Strongly encourage             Strongly discourage ?  discontinuation of antibiotics    initiation of antibiotics ?   --

## 2022-02-15 NOTE — Progress Notes (Signed)
PT Cancellation Note ? ?Patient Details ?Name: Gabrielle Vincent ?MRN: [id number] ?DOB: [birth year] ? ? ?Cancelled Treatment:    Reason Eval/Treat Not Completed: Patient at procedure or test/unavailable (pt off floor for epidural injection. RN reported pt has been confused and recently received ativan so has been lethargic, she recommended PT attempt evaluation tomorrow. Will follow.) ? ?Blondell Reveal Kistler PT 02/15/2022  ?Acute Rehabilitation Services ?Pager [phone] ?Office [phone] ? ?

## 2022-02-15 NOTE — Progress Notes (Signed)
Notified by bedside RN of elevated heart rate between 100-110's triggering "red mews" score. Patient is afebrile and rest of vitals appear within expected range. Patient has been medicated for pain recently. Advised to follow screening protocols and call rapid nurse back if any further changes in condition. ?

## 2022-02-15 NOTE — Progress Notes (Addendum)
?      ? ? ? [facility] ?                                                                           ? ? ?Gabrielle Vincent, is a 56 y.o. female, DOB - 01/05/1966, JGG:[id number] ?Admit date - 02/14/2022    ?Outpatient Primary MD for the patient is Pcp, No ? ?LOS - 1  days ? ?Chief Complaint  ?Patient presents with  ? Back Pain  ?    ? ?Brief summary  ? ?Patient is a 56 year old female with history of seasonal allergies, obesity presented to ED with complaints of severe low back pain radiating down to her left lower extremity, associated with hematuria and dysuria. ?Patient reported that she has been having constant lower back pain, 10/10 over the weekend, progressively worsening, radiating down to her left leg.  No weakness in the legs however having difficulty ambulating due to pain.  She has not been eating too well, also noted urinary retention, decreased urination, hematuria and dysuria.  No acute fevers, no nausea vomiting or diarrhea. ?Patient went to the ED where labs were drawn which showed multiple electrolyte abnormalities and was referred for admission. ?Sodium 127, potassium 3.4, chloride 90, bicarb 18, creatinine 3.64, elevated LFTs, anion gap 19, WBCs 21.3, hemoglobin 15.9 ?  ?CT renal stone study showed no obstructive uropathy, severe fatty infiltration of the liver with mild hepatomegaly.  Cholelithiasis with no acute cholecystitis.  Colonic diverticulosis.  Mild circumferential thickening of the distal esophagus may represent esophagitis. ?Global enlargement of the uterus with thickened appearance of endometrial stripe measuring 2.0 cm, subserosal uterine fibroid, recommended nonemergent pelvic ultrasound ? ? ?Assessment & Plan  ? ? ?Principal Problem: ?  Sepsis secondary to UTI [facility]) ?-Patient met sepsis criteria on admission with tachycardia, tachypnea, leukocytosis, acute kidney injury, source likely UTI.  Elevated procalcitonin 35.18 ?-Follow urine culture, blood cultures, continue IV  Rocephin ? ?Active Problems: ?Acute lumbar spine low back pain with radiculopathy to LLE ?-Placed on IV morphine for pain control, oxycodone, Robaxin, decreased due to confusion today. ?-Unfortunately unable to give NSAIDs due to AKI.  ?-MRI of the lumbar spine showed small left subarticular disc extrusion with inferior migration at L3-L4 narrowing the left lateral recess, moderate L4-5 facet arthrosis with widening of facets left greater than right, may be source of low back pain.  Small central disc protrusion at L5-S1 ?-I have consulted neurosurgery, currently awaiting callback.  Will benefit from [place] injection ?Addendum: 5:35pm ?ESI cancelled as patient was confused to consent and did not lay still.  ?CT head was limited study due to her restlessness and ?acute/subacute infarct. D/w Dr Leonel Ramsay, neurology, recommended MRI brain to confirm and ASA 372m daily for now until MRI is completed.  ?Difficult situtaion as patient got confused with opoid's however NSAIDS are not an option with AKI. Unable to give tylenol with elevated LFT's.  ? ?  Hyponatremia ?-Improving, continue IV fluid hydration ? ?  Hypokalemia ?-Resolved ? ?  AKI (acute kidney injury) ([facility] with metabolic acidosis ?-Creatinine 3.64 on admission, unknown baseline ?-Continue IV fluid hydration, creatinine improving ?-No obstructive uropathy on CT scan ? ?Acute transaminitis ?-Likely due to fatty liver,  sepsis ?-CT scan and liver ultrasound showed fatty liver, cholelithiasis but no acute cholecystitis ? ?Esophagitis ?-Placed on Protonix ? ?Polycythemia, thrombocytopenia ?-Unclear etiology, hold Lovenox ?-Outpatient referral to hematology upon discharge. ? ?Hyperglycemia, diabetes mellitus type 2 new diagnosis ?-Patient reports no prior history of diabetes mellitus ?-Hemoglobin A1c 8.9 consistent with new diagnosis of diabetes mellitus type 2 ?-Will place on metformin 500 mg twice daily on dc ?-For now continue carb modified diet, sliding scale  insulin while inpatient ? ?Essential hypertension with tachycardia ?-Placed on Lopressor 12.5 mg twice daily ? ? ?Obesity ?Estimated body mass index is 36.05 kg/m? as calculated from the following: ?  Height as of this encounter: 5' 4"  (1.626 m). ?  Weight as of this encounter: 95.3 kg. ? ?Code Status: Full CODE STATUS ?DVT Prophylaxis:  Place and maintain sequential compression device Start: 02/15/22 1026 ? ? ?Level of Care: Level of care: Progressive ?Family Communication: Updated patient's brother on phone  ? ? ?Disposition Plan:      Remains inpatient appropriate: PT OT pending, creatinine currently not at baseline, awaiting neurosurgery conditions ? ? ?Procedures:  ?MRI lumbar spine ? ?Consultants:   ?Patient neurosurgery ? ?Antimicrobials:  ? ?Anti-infectives (From admission, onward)  ? ? Start     Dose/Rate Route Frequency Ordered Stop  ? 02/15/22 1230  cefTRIAXone (ROCEPHIN) 1 g in sodium chloride 0.9 % 100 mL IVPB       ? 1 g ?200 mL/hr over 30 Minutes Intravenous Every 24 hours 02/14/22 1306    ? 02/14/22 1230  cefTRIAXone (ROCEPHIN) 1 g in sodium chloride 0.9 % 100 mL IVPB       ? 1 g ?200 mL/hr over 30 Minutes Intravenous  Once 02/14/22 1225 02/14/22 1318  ? ?  ? ? ? ? ? ?Medications ? docusate sodium  100 mg Oral BID  ? insulin aspart  0-5 Units Subcutaneous QHS  ? insulin aspart  0-9 Units Subcutaneous TID WC  ? metoprolol tartrate  12.5 mg Oral BID  ? pantoprazole  40 mg Oral Daily  ? ? ? ? ?Subjective:  ? ?Gabrielle Vincent was seen and examined today.  Somewhat confused and rambling today however back pain is better controlled.  Sitting up and eating breakfast, denies any nausea vomiting or diarrhea.  No fevers or chills.   ? ?Objective:  ? ?Vitals:  ? 02/15/22 0400 02/15/22 0500 02/15/22 0600 02/15/22 0852  ?BP: 120/84 108/81 (!) 157/75 136/81  ?Pulse: (!) 105 (!) 110 (!) 104 (!) 108  ?Resp:  (!) 30 13 20   ?Temp: 97.6 ?F (36.4 ?C) 97.6 ?F (36.4 ?C)  97.7 ?F (36.5 ?C)  ?TempSrc: Oral Oral  Oral  ?SpO2:  92% 91% 92% 90%  ?Weight:      ?Height:      ? ? ?Intake/Output Summary (Last 24 hours) at 02/15/2022 1028 ?Last data filed at 02/15/2022 3419 ?Gross per 24 hour  ?Intake 4023.01 ml  ?Output --  ?Net 4023.01 ml  ? ? ? ?Wt Readings from Last 3 Encounters:  ?02/14/22 95.3 kg  ?01/30/16 79.4 kg  ? ? ? ?Exam ?General: Alert and oriented x 3, NAD ?Cardiovascular: S1 S2 auscultated,  RRR ?Respiratory: Clear to auscultation bilaterally ?Gastrointestinal: Soft, nontender, nondistended, + bowel sounds ?Ext: no pedal edema bilaterally ?Neuro: Strength 5/5 upper and lower extremities bilaterally ?Psych: Normal affect and demeanor, alert and oriented x3  ? ? ? ?Data Reviewed:  I have personally reviewed following labs  ? ? ?CBC ?Lab Results  ?Component  Value Date  ? WBC 23.3 (H) 02/15/2022  ? RBC 5.61 (H) 02/15/2022  ? HGB 16.2 (H) 02/15/2022  ? HCT 48.9 (H) 02/15/2022  ? MCV 87.2 02/15/2022  ? MCH 28.9 02/15/2022  ? PLT 84 (L) 02/15/2022  ? MCHC 33.1 02/15/2022  ? RDW 16.5 (H) 02/15/2022  ? LYMPHSABS 0.9 02/14/2022  ? MONOABS 1.4 (H) 02/14/2022  ? EOSABS 0.0 02/14/2022  ? BASOSABS 0.0 02/14/2022  ? ? ? ?Last metabolic panel ?Lab Results  ?Component Value Date  ? NA 131 (L) 02/15/2022  ? K 3.8 02/15/2022  ? CL 104 02/15/2022  ? CO2 16 (L) 02/15/2022  ? BUN 52 (H) 02/15/2022  ? CREATININE 2.78 (H) 02/15/2022  ? GLUCOSE 249 (H) 02/15/2022  ? GFRNONAA 19 (L) 02/15/2022  ? CALCIUM 8.2 (L) 02/15/2022  ? PROT 6.6 02/15/2022  ? ALBUMIN 2.5 (L) 02/15/2022  ? BILITOT 2.3 (H) 02/15/2022  ? ALKPHOS 235 (H) 02/15/2022  ? AST 118 (H) 02/15/2022  ? ALT 72 (H) 02/15/2022  ? ANIONGAP 11 02/15/2022  ? ? ?CBG (last 3)  ?Recent Labs  ?  02/14/22 ?1750 02/14/22 ?2252 02/15/22 ?0742  ?GLUCAP 168* 247* 203*  ?  ? ? ?Coagulation Profile: ?Recent Labs  ?Lab 02/15/22 ?0622  ?INR 1.3*  ? ? ? ?Radiology Studies: I have personally reviewed the imaging studies  ?MR LUMBAR SPINE WO CONTRAST ? ?Result Date: 02/14/2022 ?IMPRESSION: 1. Diffuse thecal sac narrowing  throughout most of the lumbar spinal canal secondary to dorsal epidural lipomatosis. 2. Small left subarticular disc extrusion with inferior migration at L3-L4 narrowing the left lateral recess. Correlate for lef

## 2022-02-15 NOTE — TOC Progression Note (Signed)
Transition of Care (TOC) - Progression Note  ? ? ?Patient Details  ?Name: Chavon Lucarelli ?MRN: [id number] ?Date of Birth: [birth year] ? ?Transition of Care (TOC) CM/SW Contact  ?Purcell Mouton, RN ?Phone Number: ?02/15/2022, 10:56 AM ? ?Clinical Narrative:    ? ? ?Transition of Care (TOC) Screening Note ? ? ?Patient Details  ?Name: Moxie Kalil ?Date of Birth: 29-Dec-1965 ? ? ?Transition of Care (TOC) CM/SW Contact:    ?Purcell Mouton, RN ?Phone Number: ?02/15/2022, 10:56 AM ? ? ? ?Transition of Care Department [facility]) has reviewed patient and no TOC needs have been identified at this time. We will continue to monitor patient advancement through interdisciplinary progression rounds. If new patient transition needs arise, please place a TOC consult. ?  ? ?  ?  ? ?Expected Discharge Plan and Services ?  ?  ?  ?  ?  ?                ?  ?  ?  ?  ?  ?  ?  ?  ?  ?  ? ? ?Social Determinants of Health (SDOH) Interventions ?  ? ?Readmission Risk Interventions ?   ? View : No data to display.  ?  ?  ?  ? ? ?

## 2022-02-15 NOTE — Progress Notes (Signed)
?   02/15/22 0045  ?Vitals  ?Temp 97.6 ?F (36.4 ?C)  ?Temp Source Oral  ?BP 133/88  ?MAP (mmHg) 103  ?BP Location Right Arm  ?BP Method Automatic  ?Patient Position (if appropriate) Lying  ?Pulse Rate (!) 113  ?Pulse Rate Source [place]  ?Resp (!) 26  ?Level of Consciousness  ?Level of Consciousness Alert  ?MEWS COLOR  ?MEWS Score Color Red  ?Oxygen Therapy  ?SpO2 93 %  ?O2 Device Room Air  ?Pain Assessment  ?Pain Scale 0-10  ?Pain Score 8  ?Pain Type Acute pain  ?Pain Location Back  ?Pain Orientation Right;Left  ?Pain Descriptors / Indicators Aching  ?Pain Frequency Intermittent  ?Pain Onset On-going  ?Patients Stated Pain Goal 2  ?MEWS Score  ?MEWS Temp 0  ?MEWS Systolic 0  ?MEWS Pulse 2  ?MEWS RR 2  ?MEWS LOC 0  ?MEWS Score 4  ?Provider Notification  ?Provider Name/Title Gershon Cull NP  ?Date Provider Notified 02/15/22  ?Time Provider Notified [phone]  ?Method of Notification Page  ?Notification Reason Change in status  ?Provider response No new orders  ?Rapid Response Notification  ?Name of Rapid Response RN Notified Melony Overly RN  ?Date Rapid Response Notified 02/15/22  ?Time Rapid Response Notified 0146  ? ?Kept monitored for any untoward signs and symptoms. ?

## 2022-02-15 NOTE — Progress Notes (Signed)
Patient off floor to IR for procedure.

## 2022-02-15 NOTE — Progress Notes (Signed)
Dr. Tana Coast notified that the patient is slurring her speech a little bit and continuing to ramble on and on and not able to complete answers to certain questions. She is also seeing some spots on the ceiling. Dr. Tana Coast has decreased her pain medicine and I will continue to monitor.  ?

## 2022-02-16 ENCOUNTER — Inpatient Hospital Stay (HOSPITAL_COMMUNITY): Payer: BC Managed Care – PPO

## 2022-02-16 ENCOUNTER — Other Ambulatory Visit: Payer: Self-pay

## 2022-02-16 DIAGNOSIS — N390 Urinary tract infection, site not specified: Secondary | ICD-10-CM | POA: Diagnosis present

## 2022-02-16 DIAGNOSIS — J9600 Acute respiratory failure, unspecified whether with hypoxia or hypercapnia: Secondary | ICD-10-CM

## 2022-02-16 DIAGNOSIS — M5440 Lumbago with sciatica, unspecified side: Secondary | ICD-10-CM | POA: Diagnosis not present

## 2022-02-16 DIAGNOSIS — G9340 Encephalopathy, unspecified: Secondary | ICD-10-CM

## 2022-02-16 DIAGNOSIS — M5416 Radiculopathy, lumbar region: Secondary | ICD-10-CM

## 2022-02-16 DIAGNOSIS — B9620 Unspecified Escherichia coli [E. coli] as the cause of diseases classified elsewhere: Secondary | ICD-10-CM

## 2022-02-16 DIAGNOSIS — R7989 Other specified abnormal findings of blood chemistry: Secondary | ICD-10-CM | POA: Diagnosis not present

## 2022-02-16 DIAGNOSIS — E1165 Type 2 diabetes mellitus with hyperglycemia: Secondary | ICD-10-CM

## 2022-02-16 DIAGNOSIS — A419 Sepsis, unspecified organism: Secondary | ICD-10-CM | POA: Diagnosis not present

## 2022-02-16 DIAGNOSIS — G9349 Other encephalopathy: Secondary | ICD-10-CM

## 2022-02-16 DIAGNOSIS — N10 Acute tubulo-interstitial nephritis: Secondary | ICD-10-CM | POA: Diagnosis not present

## 2022-02-16 DIAGNOSIS — N179 Acute kidney failure, unspecified: Secondary | ICD-10-CM | POA: Diagnosis not present

## 2022-02-16 DIAGNOSIS — J9621 Acute and chronic respiratory failure with hypoxia: Secondary | ICD-10-CM

## 2022-02-16 LAB — COMPREHENSIVE METABOLIC PANEL
ALT: 82 U/L — ABNORMAL HIGH (ref 0–44)
AST: 118 U/L — ABNORMAL HIGH (ref 15–41)
Albumin: 2.3 g/dL — ABNORMAL LOW (ref 3.5–5.0)
Alkaline Phosphatase: 237 U/L — ABNORMAL HIGH (ref 38–126)
Anion gap: 12 (ref 5–15)
BUN: 62 mg/dL — ABNORMAL HIGH (ref 6–20)
CO2: 17 mmol/L — ABNORMAL LOW (ref 22–32)
Calcium: 8.9 mg/dL (ref 8.9–10.3)
Chloride: 118 mmol/L — ABNORMAL HIGH (ref 98–111)
Creatinine, Ser: 2.09 mg/dL — ABNORMAL HIGH (ref 0.44–1.00)
GFR, Estimated: 27 mL/min — ABNORMAL LOW (ref >60)
Glucose, Bld: 217 mg/dL — ABNORMAL HIGH (ref 70–99)
Potassium: 3.7 mmol/L (ref 3.5–5.1)
Sodium: 147 mmol/L — ABNORMAL HIGH (ref 135–145)
Total Bilirubin: 2.2 mg/dL — ABNORMAL HIGH (ref 0.3–1.2)
Total Protein: 6.4 g/dL — ABNORMAL LOW (ref 6.5–8.1)

## 2022-02-16 LAB — BASIC METABOLIC PANEL
Anion gap: 15 (ref 5–15)
Anion gap: 14 (ref 5–15)
Anion gap: 12 (ref 5–15)
BUN: 58 mg/dL — ABNORMAL HIGH (ref 6–20)
BUN: 57 mg/dL — ABNORMAL HIGH (ref 6–20)
BUN: 55 mg/dL — ABNORMAL HIGH (ref 6–20)
CO2: 14 mmol/L — ABNORMAL LOW (ref 22–32)
CO2: 18 mmol/L — ABNORMAL LOW (ref 22–32)
CO2: 17 mmol/L — ABNORMAL LOW (ref 22–32)
Calcium: 8.8 mg/dL — ABNORMAL LOW (ref 8.9–10.3)
Calcium: 8.9 mg/dL (ref 8.9–10.3)
Calcium: 9 mg/dL (ref 8.9–10.3)
Chloride: 119 mmol/L — ABNORMAL HIGH (ref 98–111)
Chloride: 120 mmol/L — ABNORMAL HIGH (ref 98–111)
Chloride: 125 mmol/L — ABNORMAL HIGH (ref 98–111)
Creatinine, Ser: 2 mg/dL — ABNORMAL HIGH (ref 0.44–1.00)
Creatinine, Ser: 2.01 mg/dL — ABNORMAL HIGH (ref 0.44–1.00)
Creatinine, Ser: 2.13 mg/dL — ABNORMAL HIGH (ref 0.44–1.00)
GFR, Estimated: 29 mL/min — ABNORMAL LOW (ref >60)
GFR, Estimated: 29 mL/min — ABNORMAL LOW (ref >60)
GFR, Estimated: 27 mL/min — ABNORMAL LOW (ref >60)
Glucose, Bld: 210 mg/dL — ABNORMAL HIGH (ref 70–99)
Glucose, Bld: 204 mg/dL — ABNORMAL HIGH (ref 70–99)
Glucose, Bld: 162 mg/dL — ABNORMAL HIGH (ref 70–99)
Potassium: 3.5 mmol/L (ref 3.5–5.1)
Potassium: 3.3 mmol/L — ABNORMAL LOW (ref 3.5–5.1)
Potassium: 3.9 mmol/L (ref 3.5–5.1)
Sodium: 148 mmol/L — ABNORMAL HIGH (ref 135–145)
Sodium: 152 mmol/L — ABNORMAL HIGH (ref 135–145)
Sodium: 154 mmol/L — ABNORMAL HIGH (ref 135–145)

## 2022-02-16 LAB — LIPID PANEL
Cholesterol: 214 mg/dL — ABNORMAL HIGH (ref 0–200)
HDL: <10 mg/dL — ABNORMAL LOW (ref >40)
Triglycerides: 356 mg/dL — ABNORMAL HIGH (ref <150)
VLDL: 71 mg/dL — ABNORMAL HIGH (ref 0–40)

## 2022-02-16 LAB — CBC WITH DIFFERENTIAL/PLATELET
Abs Immature Granulocytes: 2.35 10*3/uL — ABNORMAL HIGH (ref 0.00–0.07)
Basophils Absolute: 0 10*3/uL (ref 0.0–0.1)
Basophils Relative: 0 %
Eosinophils Absolute: 0 10*3/uL (ref 0.0–0.5)
Eosinophils Relative: 0 %
HCT: 45.4 % (ref 36.0–46.0)
Hemoglobin: 15 g/dL (ref 12.0–15.0)
Immature Granulocytes: 6 %
Lymphocytes Relative: 8 %
Lymphs Abs: 3.2 10*3/uL (ref 0.7–4.0)
MCH: 28.9 pg (ref 26.0–34.0)
MCHC: 33 g/dL (ref 30.0–36.0)
MCV: 87.5 fL (ref 80.0–100.0)
Monocytes Absolute: 2.2 10*3/uL — ABNORMAL HIGH (ref 0.1–1.0)
Monocytes Relative: 6 %
Neutro Abs: 31.5 10*3/uL — ABNORMAL HIGH (ref 1.7–7.7)
Neutrophils Relative %: 80 %
Platelets: 109 10*3/uL — ABNORMAL LOW (ref 150–400)
RBC: 5.19 MIL/uL — ABNORMAL HIGH (ref 3.87–5.11)
RDW: 16.9 % — ABNORMAL HIGH (ref 11.5–15.5)
WBC: 39.2 10*3/uL — ABNORMAL HIGH (ref 4.0–10.5)
nRBC: 0.1 % (ref 0.0–0.2)

## 2022-02-16 LAB — BLOOD GAS, ARTERIAL
Acid-base deficit: 3.2 mmol/L — ABNORMAL HIGH (ref 0.0–2.0)
Acid-base deficit: 6.9 mmol/L — ABNORMAL HIGH (ref 0.0–2.0)
Bicarbonate: 18.9 mmol/L — ABNORMAL LOW (ref 20.0–28.0)
Bicarbonate: 18.5 mmol/L — ABNORMAL LOW (ref 20.0–28.0)
O2 Saturation: 96.9 %
O2 Saturation: 99.1 %
Patient temperature: 37
Patient temperature: 38.1
pCO2 arterial: 26 mmHg — ABNORMAL LOW (ref 32–48)
pCO2 arterial: 38 mmHg (ref 32–48)
pH, Arterial: 7.47 — ABNORMAL HIGH (ref 7.35–7.45)
pH, Arterial: 7.3 — ABNORMAL LOW (ref 7.35–7.45)
pO2, Arterial: 75 mmHg — ABNORMAL LOW (ref 83–108)
pO2, Arterial: 194 mmHg — ABNORMAL HIGH (ref 83–108)

## 2022-02-16 LAB — URINE CULTURE: Culture: >=100000 — AB

## 2022-02-16 LAB — GLUCOSE, CAPILLARY
Glucose-Capillary: 165 mg/dL — ABNORMAL HIGH (ref 70–99)
Glucose-Capillary: 166 mg/dL — ABNORMAL HIGH (ref 70–99)
Glucose-Capillary: 191 mg/dL — ABNORMAL HIGH (ref 70–99)
Glucose-Capillary: 206 mg/dL — ABNORMAL HIGH (ref 70–99)
Glucose-Capillary: 209 mg/dL — ABNORMAL HIGH (ref 70–99)
Glucose-Capillary: 248 mg/dL — ABNORMAL HIGH (ref 70–99)

## 2022-02-16 LAB — SODIUM: Sodium: 153 mmol/L — ABNORMAL HIGH (ref 135–145)

## 2022-02-16 LAB — MRSA NEXT GEN BY PCR, NASAL: MRSA by PCR Next Gen: NOT DETECTED

## 2022-02-16 LAB — PROCALCITONIN: Procalcitonin: 11.98 ng/mL

## 2022-02-16 LAB — AMMONIA: Ammonia: 34 umol/L (ref 9–35)

## 2022-02-16 LAB — VITAMIN B12: Vitamin B-12: 4495 pg/mL — ABNORMAL HIGH (ref 180–914)

## 2022-02-16 LAB — TSH: TSH: 0.601 u[IU]/mL (ref 0.350–4.500)

## 2022-02-16 IMAGING — DX DG ABD PORTABLE 1V
1 series · 1 of 1 positions shown · non-contrast
Comparison: Abdominal CT [DATE].

CLINICAL DATA: Endotracheal tube, central line and orogastric tube
placement.

EXAM:
PORTABLE ABDOMEN - 1 VIEW

[abdomen kub]
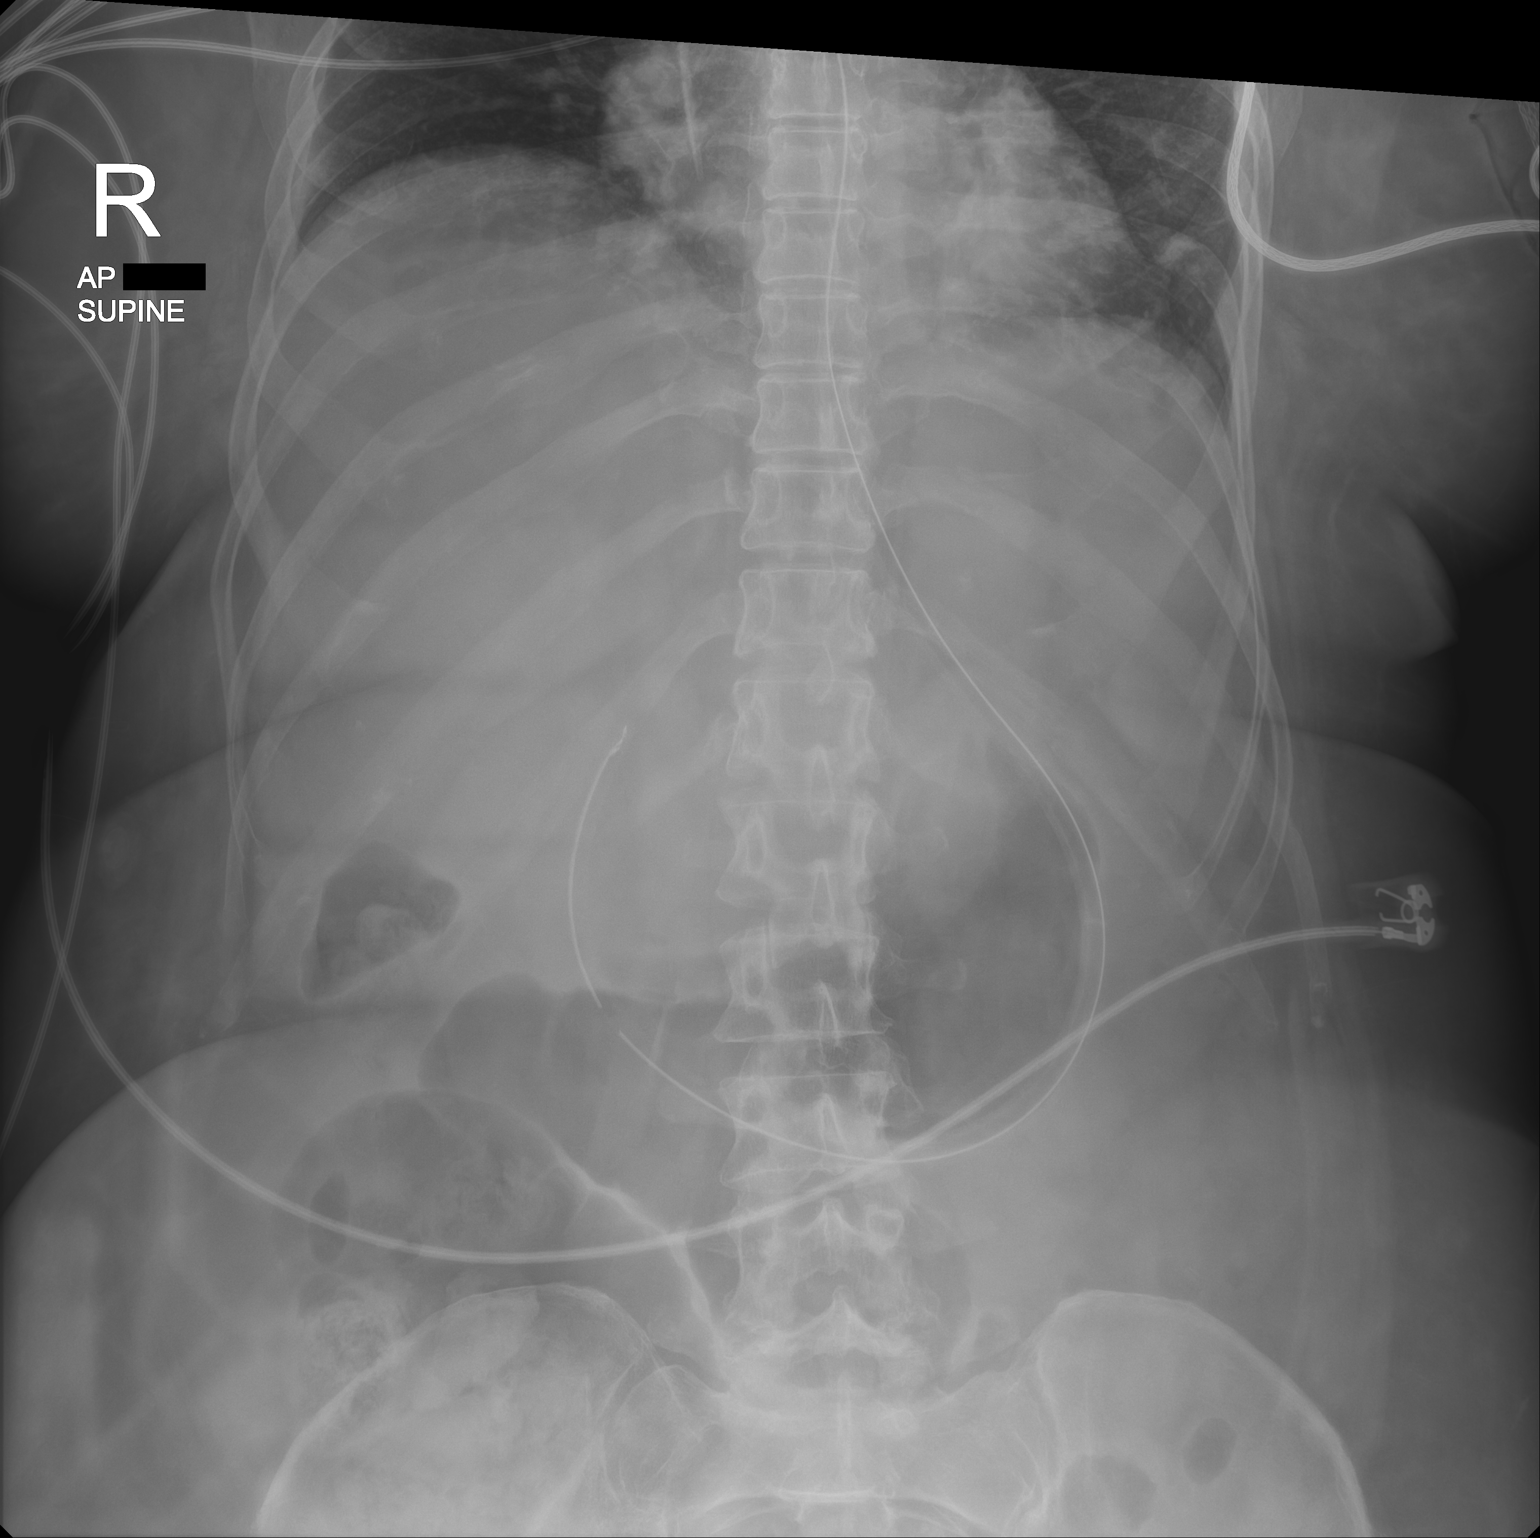

[1 of 1 positions shown; findings below may reference images not displayed]

FINDINGS: [QI] hours. Enteric tube is looped in the mid abdomen with tip in
the right upper quadrant, likely in the distal stomach or proximal
duodenum. The visualized bowel gas pattern appears nonobstructive.
No suspicious abdominal calcifications are seen. Probable mild
atelectasis at both lung bases.
IMPRESSION: Tip of the enteric tube projects over the right upper quadrant,
likely in the distal stomach or proximal duodenum.

## 2022-02-16 IMAGING — DX DG CHEST 1V PORT
1 series · 1 of 1 positions shown · non-contrast
Comparison: Radiographs [DATE].

CLINICAL DATA: Endotracheal tube, central line and orogastric tube
placement.

EXAM:
PORTABLE CHEST 1 VIEW

[chest ap]
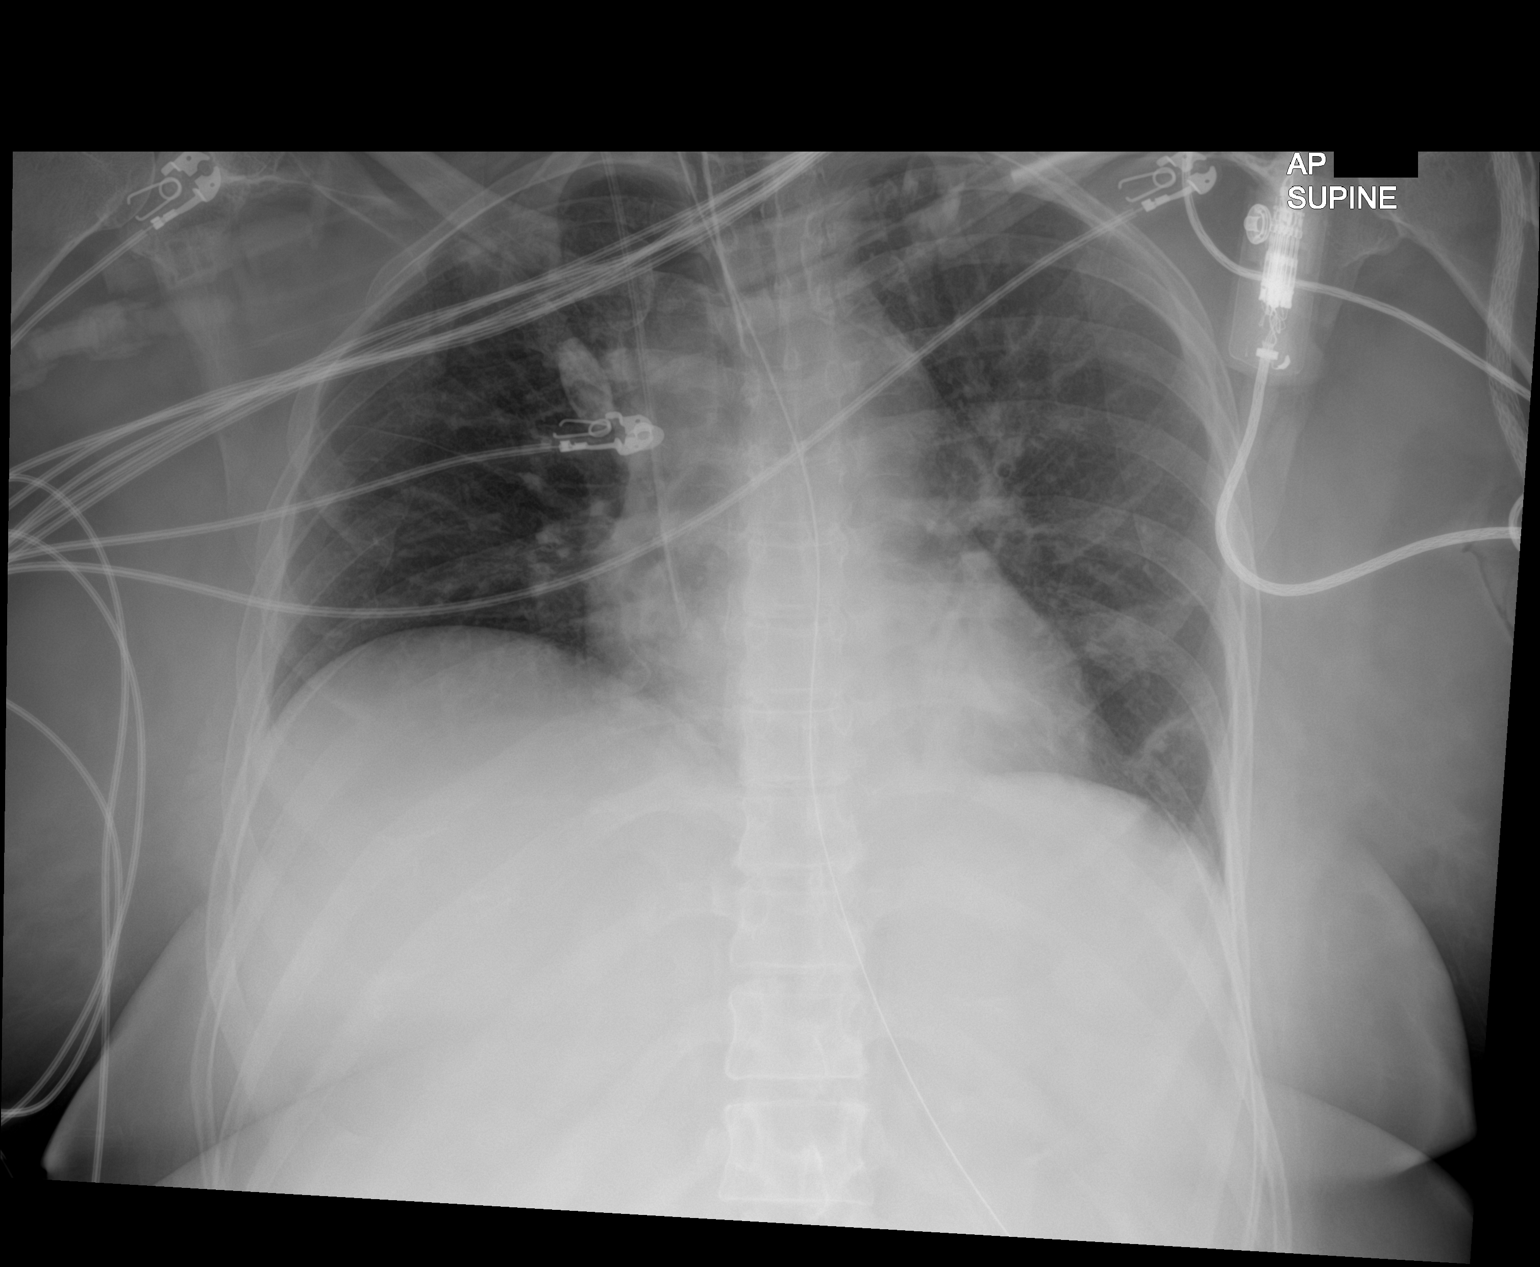

[1 of 1 positions shown; findings below may reference images not displayed]

FINDINGS: [1V] hours. Interval intubation. Tip of the endotracheal tube is 2
cm above the carina. Right IJ central venous catheter projects to
the level of the superior cavoatrial junction. Enteric tube projects
below the diaphragm, tip not visualized.

The heart size and mediastinal contours are stable. Overall mild
improvement in the pulmonary aeration with residual patchy bibasilar
atelectasis. No pneumothorax or significant pleural effusion
identified.
IMPRESSION: Satisfactory position of the support system.  No pneumothorax.

## 2022-02-16 IMAGING — MR MR HEAD W/O CM
13 series · 46 of 48 positions shown · non-contrast
Comparison: Head CT yesterday. Right basal ganglia stroke question
on that exam.

CLINICAL DATA: Neuro deficit, acute, stroke suspected. ICU
ventilator patient.

EXAM:
MRI HEAD WITHOUT CONTRAST
TECHNIQUE: Multiplanar, multiecho pulse sequences of the brain and surrounding
structures were obtained without intravenous contrast.

[Series 5: dwi_tracew · axial · 3.0mm · 1.08mm/px · z∈[-8,+138]mm · 7 of 102 slices shown]
[im 1/102]
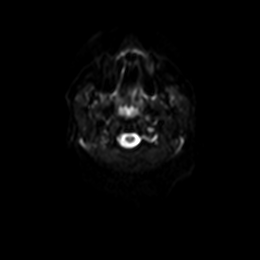
[im 17/102]
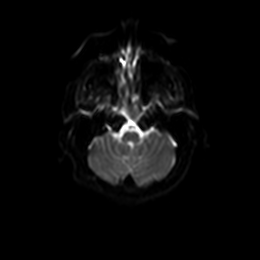
[im 34/102]
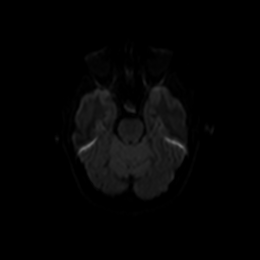
[im 51/102]
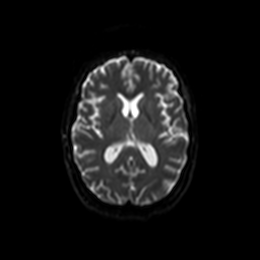
[im 68/102]
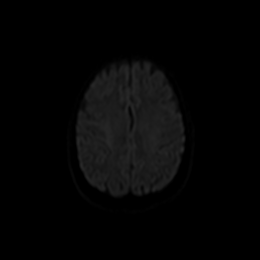
[im 85/102]
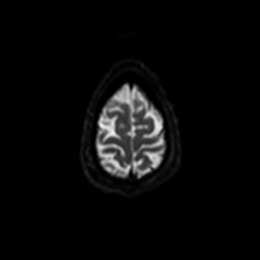
[im 102/102]
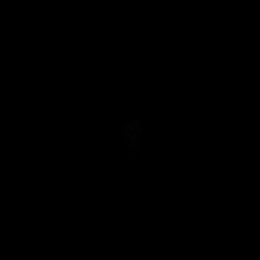

[Series 6: dwi_adc · axial · 3.0mm · 1.08mm/px · 1 of 51 slices shown]
[im 1/51]
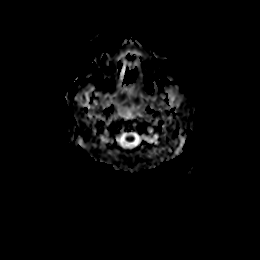

[Series 7: T2 · sagittal · 5.0mm · 0.47mm/px · 1 of 24 slices shown (1 of 3)]
[im 1/24]
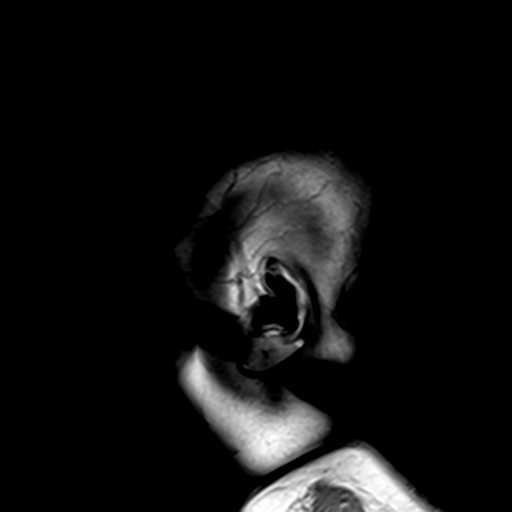

[Series 8: T2 · axial · 5.0mm · 0.45mm/px · z∈[-14,+133]mm · 2 of 24 slices shown (2 of 3)]
[im 1/24]
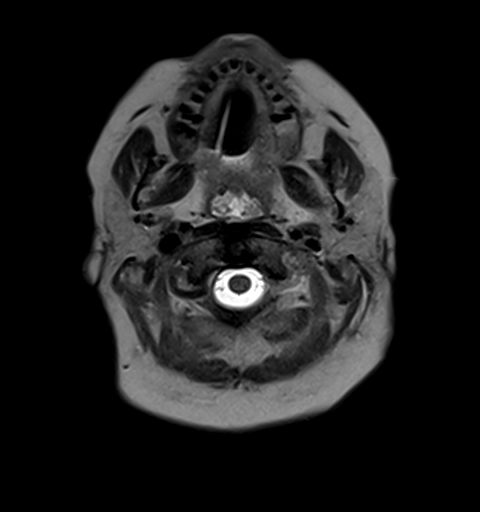
[im 24/24]
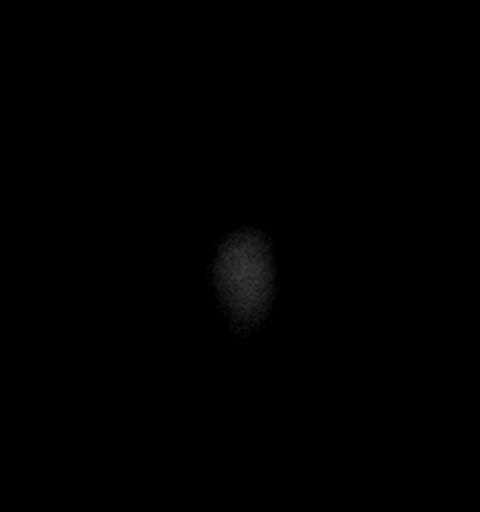

[Series 9: DWI · axial · 3.0mm · 1.36mm/px · z∈[-14,+130]mm · 7 of 99 slices shown (1 of 4)]
[im 1/99]
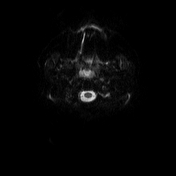
[im 17/99]
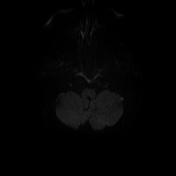
[im 33/99]
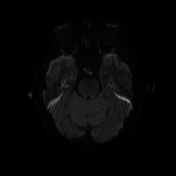
[im 50/99]
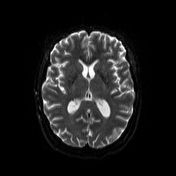
[im 66/99]
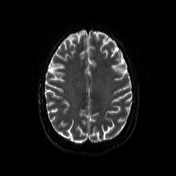
[im 82/99]
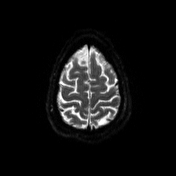
[im 99/99]
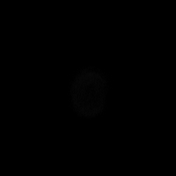

[Series 10: DWI · axial · 3.0mm · 1.36mm/px · z∈[-14,+130]mm · 4 of 50 slices shown (2 of 4)]
[im 1/50]
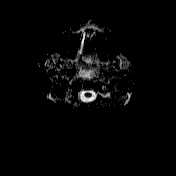
[im 17/50]
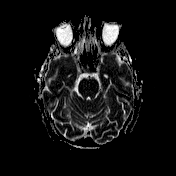
[im 33/50]
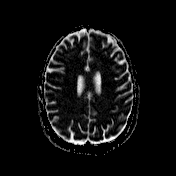
[im 50/50]
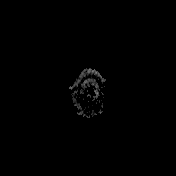

[Series 11: GRE · axial · 3.0mm · 0.45mm/px · z∈[-14,+133]mm · 4 of 51 slices shown]
[im 1/51]
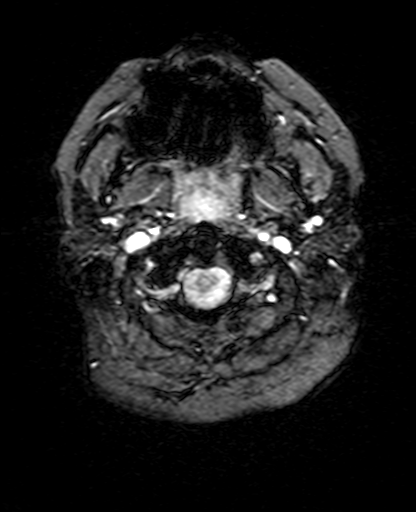
[im 17/51]
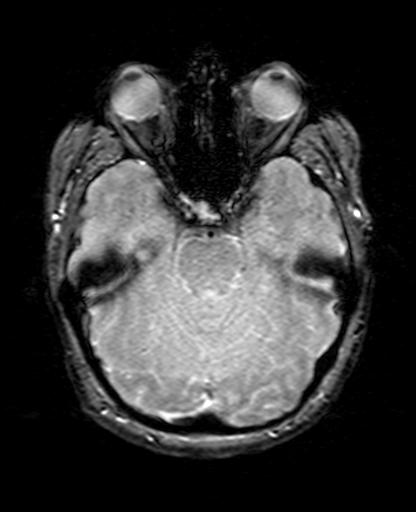
[im 34/51]
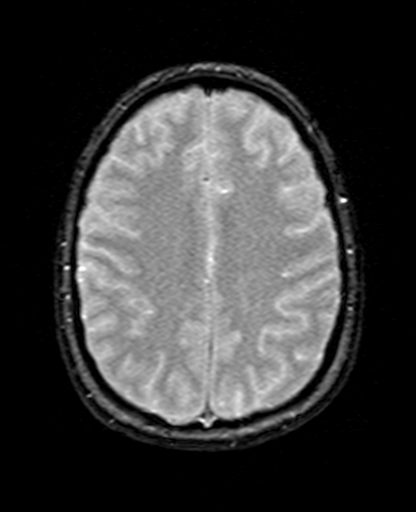
[im 51/51]
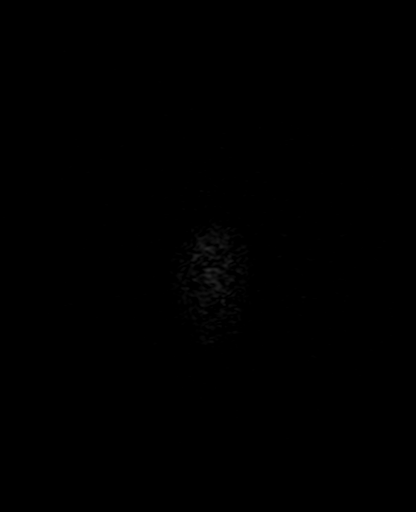

[Series 12: FLAIR · axial · 3.0mm · 0.86mm/px · z∈[-15,+132]mm · 4 of 51 slices shown (1 of 2)]
[im 1/51]
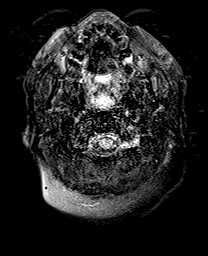
[im 17/51]
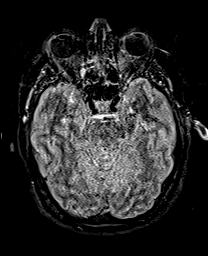
[im 34/51]
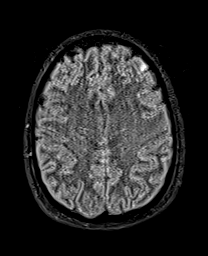
[im 51/51]
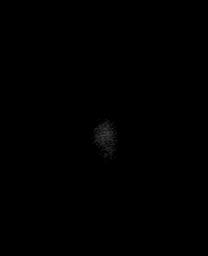

[Series 13: T1 · axial · 3.0mm · 0.45mm/px · z∈[-14,+133]mm · 4 of 51 slices shown]
[im 1/51]
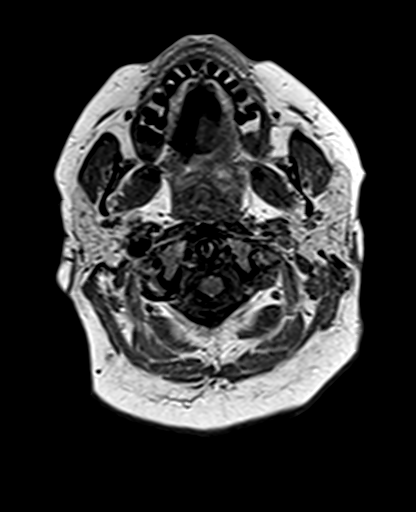
[im 17/51]
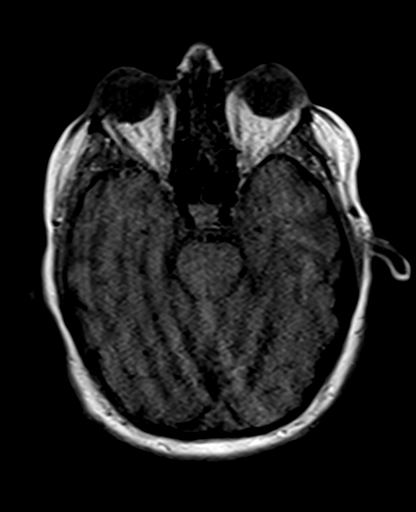
[im 34/51]
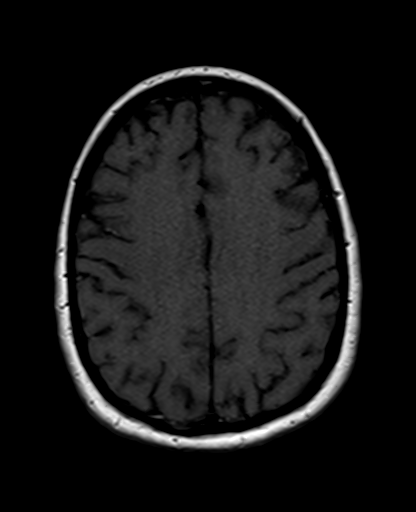
[im 51/51]
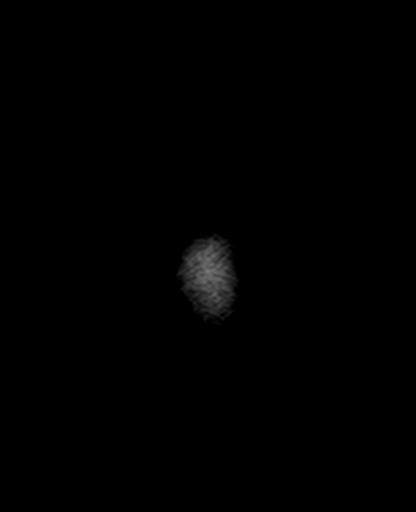

[Series 16: T2 · coronal · 5.0mm · 0.86mm/px · 2 of 24 slices shown (3 of 3)]
[im 1/24]
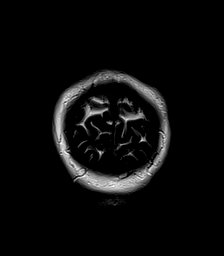
[im 24/24]
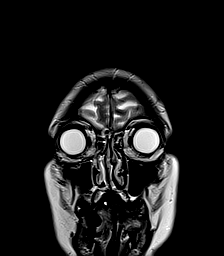

[Series 17: FLAIR · axial · 3.0mm · 0.47mm/px · z∈[-14,+135]mm · 4 of 52 slices shown (2 of 2)]
[im 1/52]
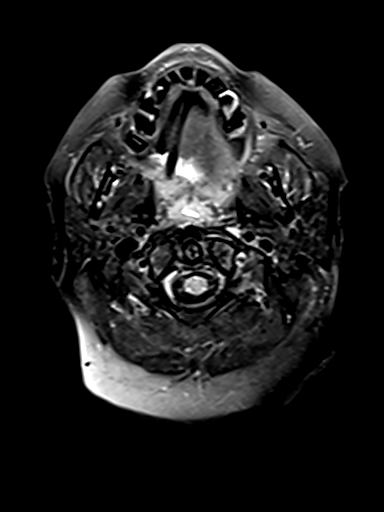
[im 18/52]
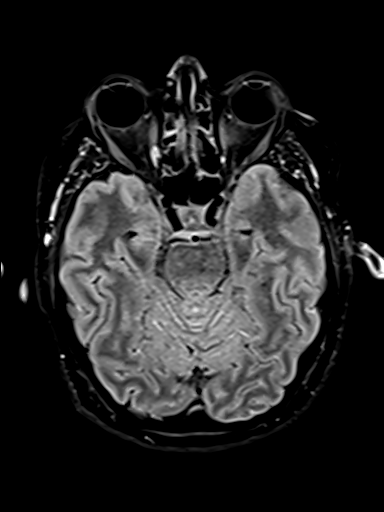
[im 35/52]
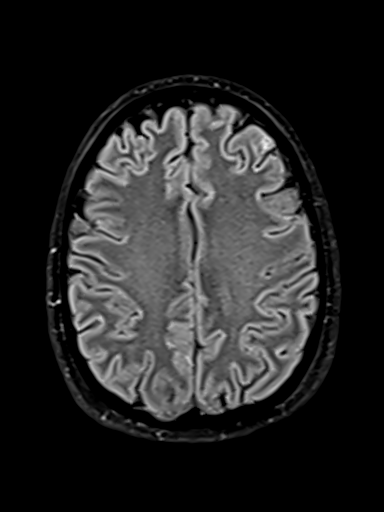
[im 52/52]
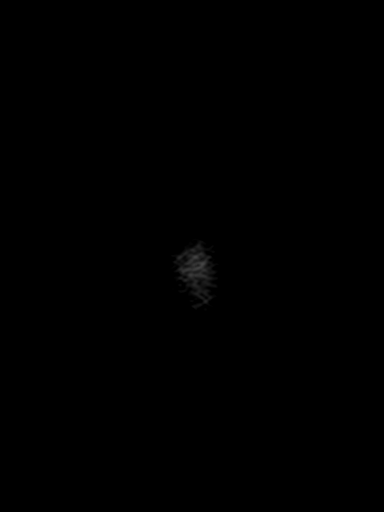

[Series 18: DWI · coronal · 5.0mm · 1.31mm/px · 4 of 48 slices shown (3 of 4)]
[im 1/48]
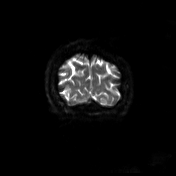
[im 16/48]
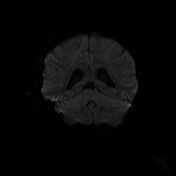
[im 32/48]
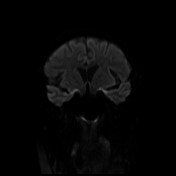
[im 48/48]
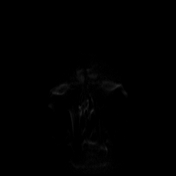

[Series 19: DWI · coronal · 5.0mm · 1.31mm/px · 2 of 24 slices shown (4 of 4)]
[im 1/24]
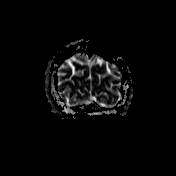
[im 24/24]
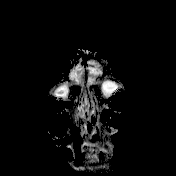

[46 of 48 positions shown; findings below may reference images not displayed]

FINDINGS: Brain: Diffusion imaging does not show any acute or subacute
infarction. No abnormality is seen affecting the brainstem or
cerebellum. Cerebral hemispheres show an old left frontal cortical
and subcortical infarction. No other brain abnormality is seen.
There is no hydrocephalus or extra-axial collection. Small amount of
layering material in the occipital horns of the lateral ventricles
can be seen in patients with chronic mechanical ventilation. This is
presumed to represented proteinaceous material. However, the
possibility of meningitis is not excluded. There is not signal loss
to suggest that this is hemorrhage.

Vascular: Major vessels at the base of the brain show flow.

Skull and upper cervical spine: Negative

Sinuses/Orbits: Small amount of fluid layering in the paranasal
sinuses. Orbits negative.

Other: None
IMPRESSION: No sign of acute or subacute infarction. No abnormality seen in the
right basal ganglia.

Old left frontal cortical and subcortical infarction.

Small amount of layering material in the lateral ventricles, which
can be seen in ventilated patients. Meningitis is not excluded. This
does not appear to represent blood products.

## 2022-02-16 IMAGING — DX DG CHEST 1V PORT
1 series · 1 of 1 positions shown · non-contrast
Comparison: Recent CT abdomen and pelvis without contrast
[DATE], partially including the chest.

CLINICAL DATA: Hypoxia and shortness of breath.

EXAM:
PORTABLE CHEST 1 VIEW

[chest ap]
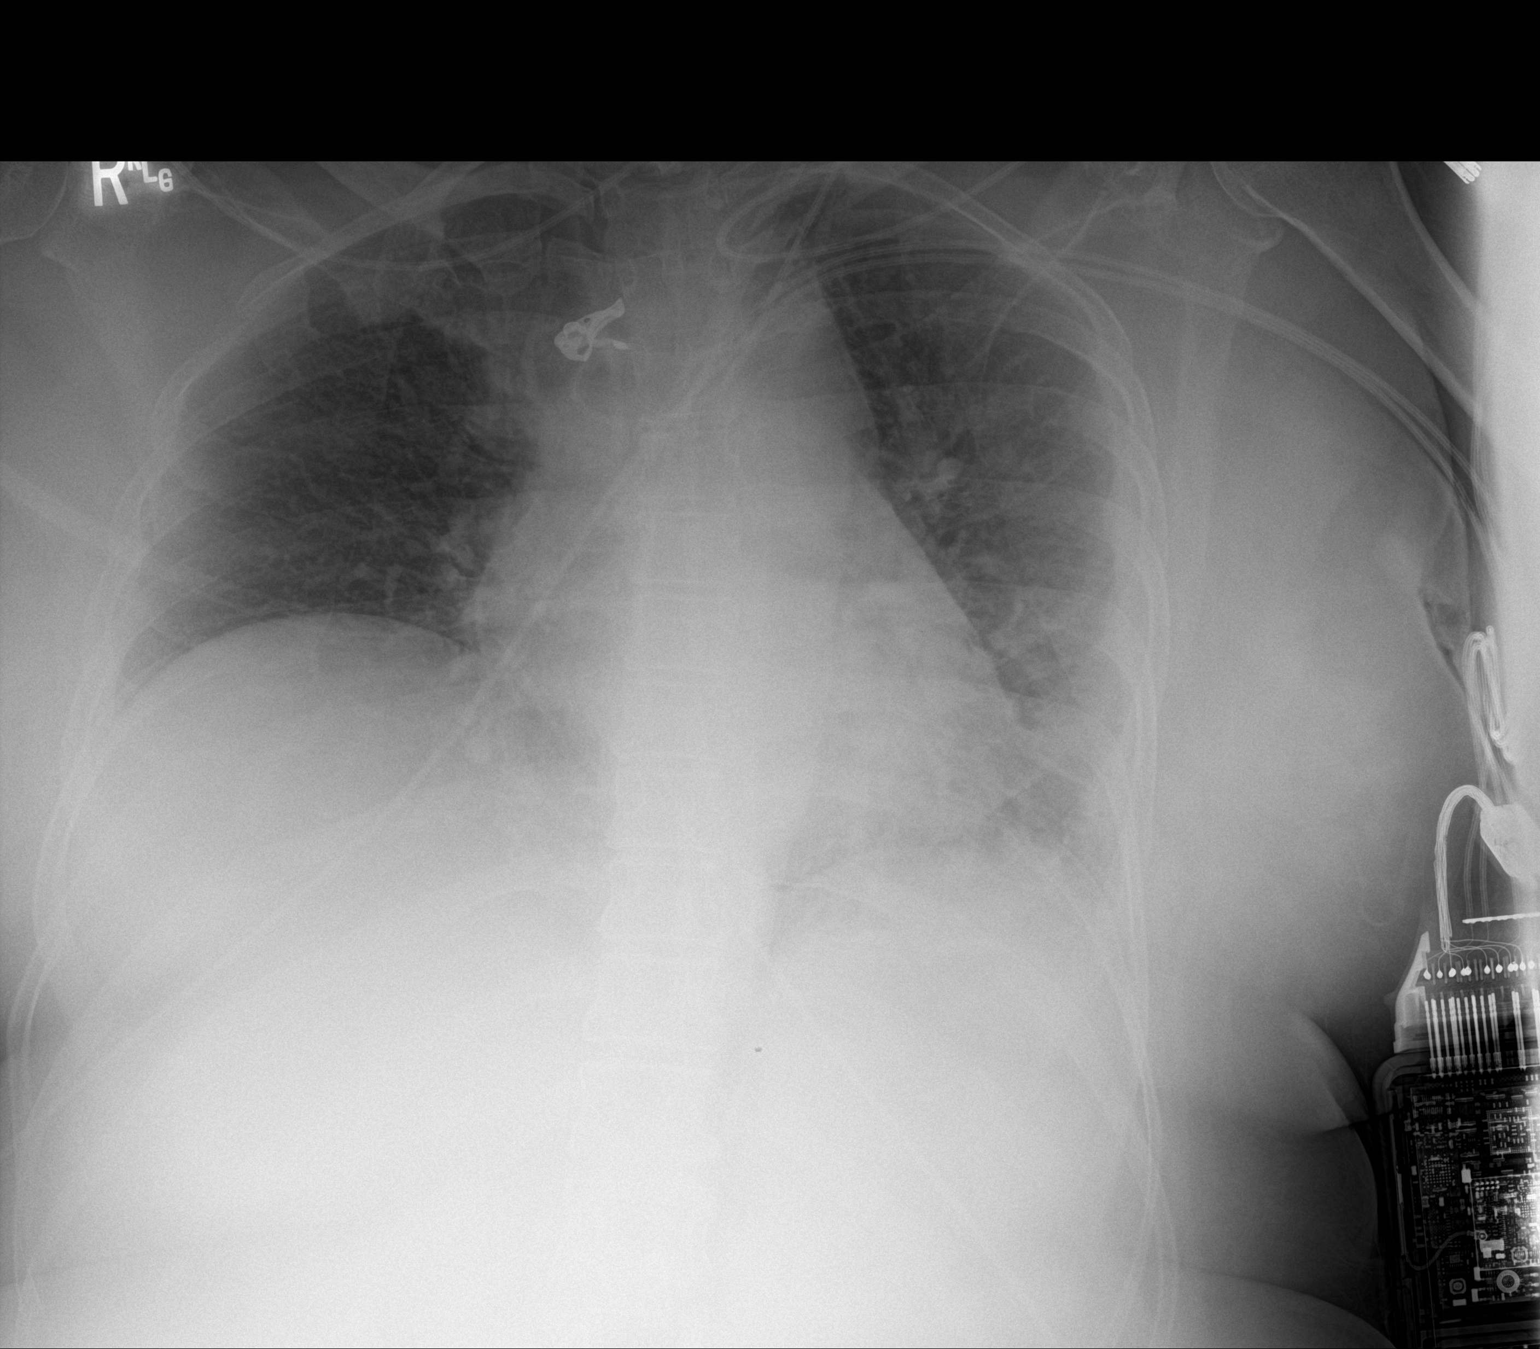

[1 of 1 positions shown; findings below may reference images not displayed]

FINDINGS: Technically limited due to body habitus. Query increased patchy hazy
opacities in the left lower lung field, concerning for pneumonia
possibly in the lingular distribution with partial obscuration of
the lower left heart border.

Follow-up PA and lateral study may be helpful. The remaining lungs
are clear with limited view of the right lower lung field due to
again noted moderate elevation of the right hemidiaphragm.

The cardiac size is normal.  The mediastinum is normally outlined.
IMPRESSION: Limited exam with possible increased opacity in the left lower lung
field. PA and lateral study would be helpful to see if this
persists. Moderately elevated right hemidiaphragm which could be due
to eventration or paresis.

## 2022-02-16 MED ORDER — DEXTROSE 5 % IV SOLN: INTRAVENOUS | Status: DC

## 2022-02-16 MED ORDER — ACETAMINOPHEN 160 MG/5ML PO SOLN
650.0000 mg | ORAL | Status: DC | PRN
Start: 2022-02-16 — End: 2022-02-16
  Administered 2022-02-16: 650 mg
  Filled 2022-02-16: qty 20.3

## 2022-02-16 MED ORDER — ASPIRIN 325 MG PO TABS
325.0000 mg | ORAL_TABLET | Freq: Every day | ORAL | Status: DC
  Administered 2022-02-16: 325 mg
  Filled 2022-02-16: qty 1

## 2022-02-16 MED ORDER — NALOXONE HCL 0.4 MG/ML IJ SOLN
0.2000 mg | Freq: Once | INTRAMUSCULAR | Status: AC
  Administered 2022-02-16: 0.2 mg via INTRAVENOUS
  Filled 2022-02-16: qty 1

## 2022-02-16 MED ORDER — CHLORHEXIDINE GLUCONATE 0.12% ORAL RINSE (MEDLINE KIT)
15.0000 mL | Freq: Two times a day (BID) | OROMUCOSAL | Status: DC
  Administered 2022-02-16 – 2022-02-23 (×14): 15 mL via OROMUCOSAL

## 2022-02-16 MED ORDER — FENTANYL BOLUS VIA INFUSION
50.0000 ug | INTRAVENOUS | Status: DC | PRN
  Administered 2022-02-16: 50 ug via INTRAVENOUS
  Administered 2022-02-16 (×2): 100 ug via INTRAVENOUS
  Administered 2022-02-16: 50 ug via INTRAVENOUS
  Administered 2022-02-16: 100 ug via INTRAVENOUS
  Administered 2022-02-16 (×2): 50 ug via INTRAVENOUS
  Administered 2022-02-17 – 2022-02-19 (×7): 100 ug via INTRAVENOUS
  Administered 2022-02-19 (×2): 50 ug via INTRAVENOUS
  Administered 2022-02-19: 25 ug via INTRAVENOUS
  Administered 2022-02-19: 75 ug via INTRAVENOUS
  Administered 2022-02-20: 100 ug via INTRAVENOUS
  Administered 2022-02-20: 50 ug via INTRAVENOUS
  Administered 2022-02-20: 100 ug via INTRAVENOUS
  Administered 2022-02-21: 50 ug via INTRAVENOUS
  Administered 2022-02-21 (×4): 100 ug via INTRAVENOUS
  Administered 2022-02-21 (×2): 50 ug via INTRAVENOUS
  Administered 2022-02-22: 100 ug via INTRAVENOUS
  Administered 2022-02-22 (×2): 50 ug via INTRAVENOUS
  Administered 2022-02-22: 100 ug via INTRAVENOUS
  Administered 2022-02-22: 25 ug via INTRAVENOUS
  Administered 2022-02-22: 75 ug via INTRAVENOUS
  Administered 2022-02-23: 100 ug via INTRAVENOUS
  Administered 2022-02-23: 75 ug via INTRAVENOUS
  Administered 2022-02-23 (×2): 100 ug via INTRAVENOUS
  Administered 2022-02-23: 75 ug via INTRAVENOUS
  Administered 2022-02-23: 50 ug via INTRAVENOUS
  Administered 2022-02-23 (×2): 75 ug via INTRAVENOUS
  Administered 2022-02-27: 25 ug via INTRAVENOUS
  Administered 2022-02-28: 50 ug via INTRAVENOUS
  Filled 2022-02-16: qty 100

## 2022-02-16 MED ORDER — ORAL CARE MOUTH RINSE: 15.0000 mL | Freq: Two times a day (BID) | OROMUCOSAL | Status: DC

## 2022-02-16 MED ORDER — POLYETHYLENE GLYCOL 3350 17 G PO PACK
17.0000 g | PACK | Freq: Every day | ORAL | Status: DC
  Administered 2022-02-16 – 2022-02-25 (×5): 17 g
  Filled 2022-02-16 (×6): qty 1

## 2022-02-16 MED ORDER — SODIUM CHLORIDE 0.9 % IV SOLN
2.0000 g | INTRAVENOUS | Status: DC
  Administered 2022-02-16: 2 g via INTRAVENOUS
  Filled 2022-02-16: qty 20

## 2022-02-16 MED ORDER — CEFAZOLIN SODIUM-DEXTROSE 2-4 GM/100ML-% IV SOLN
2.0000 g | Freq: Two times a day (BID) | INTRAVENOUS | Status: DC
  Filled 2022-02-16: qty 100

## 2022-02-16 MED ORDER — ETOMIDATE 2 MG/ML IV SOLN: 20.0000 mg | Freq: Once | INTRAVENOUS | Status: AC

## 2022-02-16 MED ORDER — NOREPINEPHRINE 16 MG/250ML-% IV SOLN
0.0000 ug/min | INTRAVENOUS | Status: DC
  Administered 2022-02-16: 2 ug/min via INTRAVENOUS
  Filled 2022-02-16: qty 250

## 2022-02-16 MED ORDER — FENTANYL CITRATE (PF) 100 MCG/2ML IJ SOLN: 100.0000 ug | Freq: Once | INTRAMUSCULAR | Status: AC

## 2022-02-16 MED ORDER — FENTANYL CITRATE (PF) 100 MCG/2ML IJ SOLN
INTRAMUSCULAR | Status: AC
  Administered 2022-02-16: 100 ug via INTRAVENOUS
  Filled 2022-02-16: qty 2

## 2022-02-16 MED ORDER — FENTANYL CITRATE PF 50 MCG/ML IJ SOSY
PREFILLED_SYRINGE | INTRAMUSCULAR | Status: AC
  Administered 2022-02-16: 50 ug via INTRAVENOUS
  Filled 2022-02-16: qty 2

## 2022-02-16 MED ORDER — POTASSIUM CHLORIDE 10 MEQ/100ML IV SOLN
10.0000 meq | INTRAVENOUS | Status: AC
  Administered 2022-02-16: 10 meq via INTRAVENOUS
  Filled 2022-02-16 (×2): qty 100

## 2022-02-16 MED ORDER — LIDOCAINE HCL 2 % IJ SOLN
INTRAMUSCULAR | Status: AC
Start: 2022-02-16 — End: 2022-02-16
  Administered 2022-02-16: 400 mg
  Filled 2022-02-16: qty 20

## 2022-02-16 MED ORDER — PANTOPRAZOLE 2 MG/ML SUSPENSION: 40.0000 mg | Freq: Every day | ORAL | Status: DC

## 2022-02-16 MED ORDER — CHLORHEXIDINE GLUCONATE CLOTH 2 % EX PADS
6.0000 | MEDICATED_PAD | Freq: Every day | CUTANEOUS | Status: DC
  Administered 2022-02-16: 6 via TOPICAL

## 2022-02-16 MED ORDER — ASPIRIN 300 MG RE SUPP
300.0000 mg | Freq: Every day | RECTAL | Status: DC
  Filled 2022-02-16: qty 1

## 2022-02-16 MED ORDER — FENTANYL CITRATE PF 50 MCG/ML IJ SOSY: 50.0000 ug | PREFILLED_SYRINGE | Freq: Once | INTRAMUSCULAR | Status: AC

## 2022-02-16 MED ORDER — FENTANYL CITRATE (PF) 100 MCG/2ML IJ SOLN
INTRAMUSCULAR | Status: AC
  Filled 2022-02-16: qty 4

## 2022-02-16 MED ORDER — PANTOPRAZOLE SODIUM 40 MG IV SOLR
40.0000 mg | INTRAVENOUS | Status: DC
  Administered 2022-02-17 – 2022-02-23 (×7): 40 mg via INTRAVENOUS
  Filled 2022-02-16 (×8): qty 10

## 2022-02-16 MED ORDER — FENTANYL CITRATE PF 50 MCG/ML IJ SOSY
50.0000 ug | PREFILLED_SYRINGE | Freq: Once | INTRAMUSCULAR | Status: AC
  Administered 2022-02-16: 50 ug via INTRAVENOUS

## 2022-02-16 MED ORDER — POTASSIUM CHLORIDE 10 MEQ/100ML IV SOLN
10.0000 meq | INTRAVENOUS | Status: AC
  Administered 2022-02-16 (×2): 10 meq via INTRAVENOUS
  Filled 2022-02-16: qty 100

## 2022-02-16 MED ORDER — METOPROLOL TARTRATE 5 MG/5ML IV SOLN
5.0000 mg | Freq: Four times a day (QID) | INTRAVENOUS | Status: DC
Start: 2022-02-16 — End: 2022-02-20
  Administered 2022-02-16 – 2022-02-20 (×9): 5 mg via INTRAVENOUS
  Filled 2022-02-16 (×11): qty 5

## 2022-02-16 MED ORDER — INSULIN ASPART 100 UNIT/ML IJ SOLN
0.0000 [IU] | INTRAMUSCULAR | Status: DC
  Administered 2022-02-16: 3 [IU] via SUBCUTANEOUS
  Administered 2022-02-16: 5 [IU] via SUBCUTANEOUS
  Administered 2022-02-17: 3 [IU] via SUBCUTANEOUS
  Administered 2022-02-17 (×2): 5 [IU] via SUBCUTANEOUS
  Administered 2022-02-17 (×2): 3 [IU] via SUBCUTANEOUS
  Administered 2022-02-17 – 2022-02-18 (×3): 5 [IU] via SUBCUTANEOUS
  Administered 2022-02-18: 2 [IU] via SUBCUTANEOUS
  Administered 2022-02-18: 3 [IU] via SUBCUTANEOUS
  Administered 2022-02-18: 5 [IU] via SUBCUTANEOUS
  Administered 2022-02-18: 3 [IU] via SUBCUTANEOUS
  Administered 2022-02-19 (×3): 5 [IU] via SUBCUTANEOUS
  Administered 2022-02-19: 3 [IU] via SUBCUTANEOUS
  Administered 2022-02-19: 5 [IU] via SUBCUTANEOUS
  Administered 2022-02-19: 3 [IU] via SUBCUTANEOUS
  Administered 2022-02-20 (×7): 5 [IU] via SUBCUTANEOUS
  Administered 2022-02-21: 3 [IU] via SUBCUTANEOUS
  Administered 2022-02-21: 8 [IU] via SUBCUTANEOUS
  Administered 2022-02-21: 2 [IU] via SUBCUTANEOUS
  Administered 2022-02-21 (×2): 5 [IU] via SUBCUTANEOUS
  Administered 2022-02-22 (×2): 3 [IU] via SUBCUTANEOUS
  Administered 2022-02-22 (×3): 2 [IU] via SUBCUTANEOUS
  Administered 2022-02-22: 3 [IU] via SUBCUTANEOUS
  Administered 2022-02-23 (×3): 2 [IU] via SUBCUTANEOUS
  Administered 2022-02-23 (×2): 3 [IU] via SUBCUTANEOUS
  Administered 2022-02-23 – 2022-02-24 (×5): 2 [IU] via SUBCUTANEOUS
  Administered 2022-02-25 (×3): 3 [IU] via SUBCUTANEOUS
  Administered 2022-02-25: 5 [IU] via SUBCUTANEOUS
  Administered 2022-02-25: 3 [IU] via SUBCUTANEOUS
  Administered 2022-02-26: 2 [IU] via SUBCUTANEOUS
  Administered 2022-02-26: 5 [IU] via SUBCUTANEOUS
  Administered 2022-02-26: 2 [IU] via SUBCUTANEOUS
  Administered 2022-02-26: 3 [IU] via SUBCUTANEOUS
  Administered 2022-02-26 – 2022-02-27 (×4): 5 [IU] via SUBCUTANEOUS
  Administered 2022-02-27 (×2): 3 [IU] via SUBCUTANEOUS
  Administered 2022-02-27 (×2): 5 [IU] via SUBCUTANEOUS
  Administered 2022-02-27 – 2022-02-28 (×2): 3 [IU] via SUBCUTANEOUS
  Administered 2022-02-28: 5 [IU] via SUBCUTANEOUS
  Administered 2022-02-28: 8 [IU] via SUBCUTANEOUS
  Administered 2022-02-28: 3 [IU] via SUBCUTANEOUS
  Administered 2022-02-28: 5 [IU] via SUBCUTANEOUS
  Administered 2022-03-01 (×4): 2 [IU] via SUBCUTANEOUS
  Administered 2022-03-01: 3 [IU] via SUBCUTANEOUS
  Administered 2022-03-02: 5 [IU] via SUBCUTANEOUS
  Administered 2022-03-02 (×2): 3 [IU] via SUBCUTANEOUS
  Administered 2022-03-02: 5 [IU] via SUBCUTANEOUS
  Administered 2022-03-02 – 2022-03-03 (×3): 3 [IU] via SUBCUTANEOUS
  Administered 2022-03-03: 5 [IU] via SUBCUTANEOUS
  Administered 2022-03-03: 3 [IU] via SUBCUTANEOUS
  Administered 2022-03-03: 5 [IU] via SUBCUTANEOUS
  Administered 2022-03-03: 3 [IU] via SUBCUTANEOUS
  Administered 2022-03-03: 5 [IU] via SUBCUTANEOUS
  Administered 2022-03-03: 3 [IU] via SUBCUTANEOUS
  Administered 2022-03-04: 2 [IU] via SUBCUTANEOUS
  Administered 2022-03-04: 3 [IU] via SUBCUTANEOUS
  Administered 2022-03-04: 5 [IU] via SUBCUTANEOUS
  Administered 2022-03-04 (×2): 3 [IU] via SUBCUTANEOUS
  Administered 2022-03-05 – 2022-03-06 (×6): 2 [IU] via SUBCUTANEOUS
  Administered 2022-03-06: 3 [IU] via SUBCUTANEOUS
  Administered 2022-03-07: 2 [IU] via SUBCUTANEOUS
  Administered 2022-03-07: 3 [IU] via SUBCUTANEOUS
  Administered 2022-03-07 (×2): 2 [IU] via SUBCUTANEOUS
  Administered 2022-03-07: 3 [IU] via SUBCUTANEOUS
  Administered 2022-03-08 (×2): 2 [IU] via SUBCUTANEOUS
  Administered 2022-03-08 (×3): 3 [IU] via SUBCUTANEOUS
  Administered 2022-03-08 – 2022-03-09 (×2): 2 [IU] via SUBCUTANEOUS
  Administered 2022-03-09: 3 [IU] via SUBCUTANEOUS
  Administered 2022-03-09: 2 [IU] via SUBCUTANEOUS
  Administered 2022-03-09: 3 [IU] via SUBCUTANEOUS
  Administered 2022-03-09 (×2): 2 [IU] via SUBCUTANEOUS
  Administered 2022-03-10 – 2022-03-11 (×8): 3 [IU] via SUBCUTANEOUS
  Administered 2022-03-11 (×3): 2 [IU] via SUBCUTANEOUS
  Administered 2022-03-11 – 2022-03-12 (×3): 3 [IU] via SUBCUTANEOUS
  Administered 2022-03-12 (×2): 2 [IU] via SUBCUTANEOUS
  Administered 2022-03-12: 3 [IU] via SUBCUTANEOUS
  Administered 2022-03-12: 2 [IU] via SUBCUTANEOUS
  Administered 2022-03-12 – 2022-03-13 (×7): 3 [IU] via SUBCUTANEOUS
  Administered 2022-03-14 (×2): 2 [IU] via SUBCUTANEOUS
  Administered 2022-03-14: 3 [IU] via SUBCUTANEOUS
  Administered 2022-03-14: 1 [IU] via SUBCUTANEOUS
  Administered 2022-03-14: 5 [IU] via SUBCUTANEOUS
  Administered 2022-03-15: 3 [IU] via SUBCUTANEOUS
  Administered 2022-03-15 (×2): 2 [IU] via SUBCUTANEOUS
  Administered 2022-03-15 (×2): 3 [IU] via SUBCUTANEOUS
  Administered 2022-03-15: 4 [IU] via SUBCUTANEOUS
  Administered 2022-03-16: 5 [IU] via SUBCUTANEOUS
  Administered 2022-03-16: 2 [IU] via SUBCUTANEOUS
  Administered 2022-03-16: 3 [IU] via SUBCUTANEOUS
  Administered 2022-03-16: 2 [IU] via SUBCUTANEOUS
  Administered 2022-03-17: 3 [IU] via SUBCUTANEOUS
  Administered 2022-03-17: 2 [IU] via SUBCUTANEOUS
  Administered 2022-03-17: 8 [IU] via SUBCUTANEOUS
  Administered 2022-03-17: 3 [IU] via SUBCUTANEOUS
  Administered 2022-03-17 – 2022-03-18 (×2): 2 [IU] via SUBCUTANEOUS
  Administered 2022-03-18: 3 [IU] via SUBCUTANEOUS
  Administered 2022-03-18: 2 [IU] via SUBCUTANEOUS
  Administered 2022-03-18: 3 [IU] via SUBCUTANEOUS
  Administered 2022-03-18: 5 [IU] via SUBCUTANEOUS
  Administered 2022-03-19 (×2): 2 [IU] via SUBCUTANEOUS
  Administered 2022-03-19: 5 [IU] via SUBCUTANEOUS
  Administered 2022-03-19: 3 [IU] via SUBCUTANEOUS
  Administered 2022-03-19: 2 [IU] via SUBCUTANEOUS
  Administered 2022-03-19: 5 [IU] via SUBCUTANEOUS
  Administered 2022-03-20: 2 [IU] via SUBCUTANEOUS
  Administered 2022-03-20 (×3): 3 [IU] via SUBCUTANEOUS
  Administered 2022-03-20: 2 [IU] via SUBCUTANEOUS
  Administered 2022-03-21: 5 [IU] via SUBCUTANEOUS
  Administered 2022-03-21: 2 [IU] via SUBCUTANEOUS
  Administered 2022-03-21: 3 [IU] via SUBCUTANEOUS

## 2022-02-16 MED ORDER — ORAL CARE MOUTH RINSE
15.0000 mL | OROMUCOSAL | Status: DC
  Administered 2022-02-16 – 2022-02-23 (×69): 15 mL via OROMUCOSAL

## 2022-02-16 MED ORDER — PHENYLEPHRINE 80 MCG/ML (10ML) SYRINGE FOR IV PUSH (FOR BLOOD PRESSURE SUPPORT)
PREFILLED_SYRINGE | INTRAVENOUS | Status: AC
  Administered 2022-02-16: 160 ug
  Filled 2022-02-16: qty 10

## 2022-02-16 MED ORDER — TAMSULOSIN HCL 0.4 MG PO CAPS: 0.4000 mg | ORAL_CAPSULE | Freq: Every day | ORAL | Status: DC

## 2022-02-16 MED ORDER — DOCUSATE SODIUM 50 MG/5ML PO LIQD
100.0000 mg | Freq: Two times a day (BID) | ORAL | Status: DC
  Administered 2022-02-19 – 2022-03-02 (×14): 100 mg
  Filled 2022-02-16 (×19): qty 10

## 2022-02-16 MED ORDER — PROPOFOL 1000 MG/100ML IV EMUL
0.0000 ug/kg/min | INTRAVENOUS | Status: DC
  Administered 2022-02-16: 25 ug/kg/min via INTRAVENOUS
  Administered 2022-02-16: 5 ug/kg/min via INTRAVENOUS
  Administered 2022-02-17: 25 ug/kg/min via INTRAVENOUS
  Administered 2022-02-17 (×4): 30 ug/kg/min via INTRAVENOUS
  Administered 2022-02-18: 15 ug/kg/min via INTRAVENOUS
  Administered 2022-02-18 – 2022-02-19 (×3): 25 ug/kg/min via INTRAVENOUS
  Administered 2022-02-19: 35 ug/kg/min via INTRAVENOUS
  Administered 2022-02-19: 25 ug/kg/min via INTRAVENOUS
  Administered 2022-02-19: 16 ug/kg/min via INTRAVENOUS
  Administered 2022-02-20: 25 ug/kg/min via INTRAVENOUS
  Administered 2022-02-20: 35 ug/kg/min via INTRAVENOUS
  Filled 2022-02-16 (×16): qty 100

## 2022-02-16 MED ORDER — PANTOPRAZOLE SODIUM 40 MG IV SOLR
40.0000 mg | INTRAVENOUS | Status: DC
  Filled 2022-02-16: qty 10

## 2022-02-16 MED ORDER — SUCCINYLCHOLINE CHLORIDE 200 MG/10ML IV SOSY
PREFILLED_SYRINGE | INTRAVENOUS | Status: AC
  Filled 2022-02-16: qty 10

## 2022-02-16 MED ORDER — MIDAZOLAM HCL 2 MG/2ML IJ SOLN
INTRAMUSCULAR | Status: AC
  Filled 2022-02-16: qty 2

## 2022-02-16 MED ORDER — FENTANYL 2500MCG IN NS 250ML (10MCG/ML) PREMIX INFUSION
50.0000 ug/h | INTRAVENOUS | Status: DC
  Administered 2022-02-16: 50 ug/h via INTRAVENOUS
  Administered 2022-02-17: 150 ug/h via INTRAVENOUS
  Administered 2022-02-18: 125 ug/h via INTRAVENOUS
  Administered 2022-02-18: 100 ug/h via INTRAVENOUS
  Administered 2022-02-19: 200 ug/h via INTRAVENOUS
  Administered 2022-02-19: 150 ug/h via INTRAVENOUS
  Administered 2022-02-20: 175 ug/h via INTRAVENOUS
  Administered 2022-02-20: 200 ug/h via INTRAVENOUS
  Filled 2022-02-16 (×8): qty 250

## 2022-02-16 MED ORDER — ROCURONIUM BROMIDE 10 MG/ML (PF) SYRINGE
PREFILLED_SYRINGE | INTRAVENOUS | Status: AC
  Administered 2022-02-16: 60 mg via INTRAVENOUS
  Filled 2022-02-16: qty 10

## 2022-02-16 MED ORDER — ROCURONIUM BROMIDE 50 MG/5ML IV SOLN: 60.0000 mg | Freq: Once | INTRAVENOUS | Status: AC

## 2022-02-16 MED ORDER — SODIUM CHLORIDE 0.45 % IV SOLN: INTRAVENOUS | Status: DC

## 2022-02-16 MED ORDER — ETOMIDATE 2 MG/ML IV SOLN
INTRAVENOUS | Status: AC
  Administered 2022-02-16: 20 mg via INTRAVENOUS
  Filled 2022-02-16: qty 20

## 2022-02-16 MED ORDER — DEXTROSE-NACL 5-0.45 % IV SOLN: INTRAVENOUS | Status: DC

## 2022-02-16 MED ORDER — PANTOPRAZOLE 2 MG/ML SUSPENSION
40.0000 mg | Freq: Every day | ORAL | Status: DC
  Administered 2022-02-16: 40 mg
  Filled 2022-02-16: qty 20

## 2022-02-16 MED ORDER — VANCOMYCIN HCL 2000 MG/400ML IV SOLN
2000.0000 mg | Freq: Once | INTRAVENOUS | Status: AC
  Administered 2022-02-16: 2000 mg via INTRAVENOUS
  Filled 2022-02-16: qty 400

## 2022-02-16 MED ORDER — LACTATED RINGERS IV SOLN: INTRAVENOUS | Status: DC

## 2022-02-16 NOTE — Consult Note (Signed)
?  [facility]  ? ? ?Date of Admission:  02/14/2022    ? ?Reason for Consult: UTI ?    ?Referring Physician: Dr Tana Coast ? ?Current antibiotics: ?Ceftriaxone 5/3 - present ?Vancomycin 5/5 - present ? ? ?ASSESSMENT:   ? ?56 y.o. female admitted with: ? ?Sepsis secondary to UTI: Urine cultures from admission growing E. coli and she has been treated with ceftriaxone thus far.  She has been afebrile with a Tmax over the last 24 hours of 99.7.  Blood cultures are no growth to date. ?Severe leukocytosis: Her WBC has increased since admission when it was 21.3 and this morning is 39.2.  She did receive 2 doses of IV Solu-Medrol and hopefully this will start to decrease as her infection is treated and further steroids held. ?Acute lumbar back pain with radiculopathy: MRI was obtained at admission which did not show any evidence of infection.  Tentatively planned for epidural steroid injection however this is on hold given her encephalopathy. ?Acute kidney injury: Creatinine is improving since admission. ?Uncontrolled diabetes: Hemoglobin A1c 8.9 and patient reports no prior history of diabetes diagnosis at admission. ?Transaminitis: LFTs remain mildly elevated and overall stable.  Hepatitis panel was negative.  Liver ultrasound showed fatty liver and cholelithiasis but no acute cholecystitis. ? ?RECOMMENDATIONS:   ? ?Will narrow to cefazolin and plan to treat for 7 days through 5/10 for her E coli UTI ?Stop vancomycin ?Glycemic control ?Lab monitoring ?Follow blood cultures ?Will sign off for now, please call as needed ? ? ?Principal Problem: ?  Sepsis secondary to UTI [facility]) ?Active Problems: ?  Hyponatremia ?  Hypokalemia ?  AKI (acute kidney injury) ([place]) ?  Abnormal LFTs ?  Class II obesity ?  Hepatic steatosis ?  Cholelithiasis ?  Esophageal thickening ?  Acute low back pain ? ? ?MEDICATIONS:   ? ?Scheduled Meds: ? aspirin EC  325 mg Oral Daily  ? docusate sodium  100 mg Oral BID  ? insulin aspart   0-5 Units Subcutaneous QHS  ? insulin aspart  0-9 Units Subcutaneous TID WC  ? mouth rinse  15 mL Mouth Rinse BID  ? metoprolol tartrate  12.5 mg Oral BID  ? pantoprazole  40 mg Oral Daily  ? Urelle  1 tablet Oral TID  ? ?Continuous Infusions: ? sodium chloride 75 mL/hr at 02/16/22 0530  ? [START ON 02/17/2022]  ceFAZolin (ANCEF) IV    ? ?PRN Meds:.fentaNYL (SUBLIMAZE) injection, ondansetron **OR** ondansetron (ZOFRAN) IV, oxyCODONE, polyethylene glycol ? ?HPI:   ? ?Gabrielle Vincent is a 56 y.o. female with past medical history as documented below who presented to [facility] on 5/3 with severe low back pain radiating to her left lower extremity.  This was associated with hematuria and dysuria as well.  She also reported urinary retention and decreased urination to coincide with her hematuria and dysuria.  She did not have any fevers, chills, nausea, vomiting, diarrhea.  She went to the emergency department where labs showed electrolyte abnormalities with sodium 127, chloride 90, bicarb 18.  Her creatinine was 3.6 and her LFTs were elevated.  She was also found to have a leukocytosis of 21.3. ? ?She underwent a CT renal stone study showing no obstructive uropathy.  There was severe fatty infiltration of the liver with mild hepatomegaly.  Given her severe back pain she also underwent MRI and was given steroids and pain medication.  MRI was notable for diffuse thecal sac narrowing throughout most of  the lumbar spine secondary to epidural lipomatosis as well as left subarticular disc extrusion with inferior migration at L3-4 narrowing the left lateral recess.  There was no evidence of infection involving her spine on MRI. ? ?Her UA on admission was abnormal with pyuria and many bacteria.  Urine culture has grown greater than 100,000 colonies of E. coli.  Her initial procalcitonin was significantly elevated as well at 35 which has down trended most recently to 11 this morning.  In the setting of her receiving 2  doses of IV Solu-Medrol her WBC has increased up to 39,000 this morning.  Additionally, she has become encephalopathic of unclear etiology.  A CT head was obtained which was severely motion degraded.  There is a question of an acute/subacute infarct and neurology has recommended MRI brain to confirm once she is able to be more settled down.  Her AKI has also improved with her creatinine this morning down to 2.09 and LFTs have stabilized with an AST 118, ALT 82. ? ? ?Past Medical History:  ?Diagnosis Date  ? Aortic atherosclerosis ([place]) 02/14/2022  ? Class II obesity 02/14/2022  ? Diverticulosis 02/14/2022  ? Hepatic steatosis 02/14/2022  ? Hyperlipidemia 02/07/2016  ? Seasonal allergies   ? Swelling of lower extremity   ? bilateral  ? Varicose veins   ? right leg  ? ? ?Social History  ? ?Tobacco Use  ? Smoking status: Never  ?Substance Use Topics  ? Alcohol use: No  ? Drug use: No  ? ? ?History reviewed. No pertinent family history. ? ?Allergies  ?Allergen Reactions  ? Erythromycin Anaphylaxis  ? Penicillins Nausea Only  ? Prednisone Other (See Comments)  ?  Lymph node swelling  ? ? ?Review of Systems  ?Unable to perform ROS: Mental status change  ? ?OBJECTIVE:  ? ?Blood pressure (!) 178/94, pulse (!) 136, temperature 98.7 ?F (37.1 ?C), temperature source Oral, resp. rate (!) 34, height '5\' 4"'$  (1.626 m), weight 95.3 kg, SpO2 94 %. ?Body mass index is 36.05 kg/m?. ? ?Physical Exam ?Constitutional:   ?   Comments: She is awake, lying in bed, moving all extremities, ill-appearing  ?HENT:  ?   Head: Normocephalic and atraumatic.  ?   Mouth/Throat:  ?   Comments: Her tongue is blue ?Eyes:  ?   Extraocular Movements: Extraocular movements intact.  ?   Conjunctiva/sclera: Conjunctivae normal.  ?Cardiovascular:  ?   Rate and Rhythm: Normal rate and regular rhythm.  ?Pulmonary:  ?   Effort: Pulmonary effort is normal. No respiratory distress.  ?   Comments: Breath sounds diminished at the bases ?Abdominal:  ?   General: There is no  distension.  ?   Palpations: Abdomen is soft.  ?   Tenderness: There is no abdominal tenderness. There is no guarding or rebound.  ?Musculoskeletal:     ?   General: Normal range of motion.  ?   Cervical back: Normal range of motion and neck supple.  ?Skin: ?   General: Skin is warm and dry.  ?   Findings: No rash.  ?Neurological:  ?   General: No focal deficit present.  ?   Cranial Nerves: No cranial nerve deficit.  ?   Comments: She follows minimal commands such as opening her eyes to voice, squeezing my finger, and wiggling her toes  ? ? ? ?Lab Results: ?Lab Results  ?Component Value Date  ? WBC 39.2 (H) 02/16/2022  ? HGB 15.0 02/16/2022  ? HCT 45.4 02/16/2022  ?  MCV 87.5 02/16/2022  ? PLT 109 (L) 02/16/2022  ?  ?Lab Results  ?Component Value Date  ? NA 152 (H) 02/16/2022  ? K 3.3 (L) 02/16/2022  ? CO2 18 (L) 02/16/2022  ? GLUCOSE 204 (H) 02/16/2022  ? BUN 57 (H) 02/16/2022  ? CREATININE 2.01 (H) 02/16/2022  ? CALCIUM 8.9 02/16/2022  ? GFRNONAA 29 (L) 02/16/2022  ?  ?Lab Results  ?Component Value Date  ? ALT 82 (H) 02/16/2022  ? AST 118 (H) 02/16/2022  ? ALKPHOS 237 (H) 02/16/2022  ? BILITOT 2.2 (H) 02/16/2022  ? ? ?No results found for: CRP ? ?No results found for: ESRSEDRATE ? ?I have reviewed the micro and lab results in Epic. ? ?Imaging: ?CT HEAD WO CONTRAST (5MM) ? ?Result Date: 02/15/2022 ?CLINICAL DATA:  Mental status change, unknown cause EXAM: CT HEAD WITHOUT CONTRAST TECHNIQUE: Contiguous axial images were obtained from the base of the skull through the vertex without intravenous contrast. RADIATION DOSE REDUCTION: This exam was performed according to the departmental dose-optimization program which includes automated exposure control, adjustment of the mA and/or kV according to patient size and/or use of iterative reconstruction technique. COMPARISON:  None Available. FINDINGS: Severely limited study due to patient motion and positioning. Within this limitation: Brain: Question area of hypodensity in the  right basal ganglia (for example see series 4, image 31; series 5, image 35). No obvious evidence of acute hemorrhage, midline shift, or hydrocephalus on this limited study. Vascular: Intracranial atherosc

## 2022-02-16 NOTE — Progress Notes (Signed)
PT Cancellation Note ? ?Patient Details ?Name: Gabrielle Vincent ?MRN: [id number] ?DOB: 01/20/1966 ? ? ?Cancelled Treatment:    Reason Eval/Treat Not Completed: Medical issues which prohibited therapy (per RN, pt is agitated and confused, not appropriate for PT at present. Will follow.) ? ? ?Blondell Reveal Kistler PT 02/16/2022  ?Acute Rehabilitation Services ?Pager [phone] ?Office [phone] ? ?

## 2022-02-16 NOTE — Progress Notes (Addendum)
Request received for epidural steroid injection for back pain.  ? ?Epidural steroid injection normally offered as outpatient procedure only.  ?And also, it is performed by certain IR/draginostic radiologists; however, the procedure was approved by Dr. Nelia Shi, diagnostic radiologist yesterday.  ? ?Patient was brought down to IR after receiving Ativan, she was still too agitated, constantly moved, unable to tolerate the procedure.  ? ?Request received again today for epidural injection, none of the IR/diagnostic radiologists at [facility] today performs epidural injection.  ?Dr. Tana Coast notified via secure chat.  ? ?Per chart, patient is still agitated and confused this morning.  ?Epidural injection is on hold till further notice, when patient is not confused and can cooperate. ?Please re- order epidural injection when patient is more stable/not confused.  ? ? ?Armando Gang Sherri Mcarthy PA-C ?02/16/2022 9:26 AM ? ? ? ?

## 2022-02-16 NOTE — Progress Notes (Signed)
Made on call aware about patient's labs, especially the patient's WBC. On call provider put in new orders. Bladder scanned patient again for the second time, and bladder scan showed patient had 921 in her bladder. On call provider gave nurse another order for an in and out cath. In and out cath was performed and 1250 mL was removed from the patient's bladder. Made on call provider aware. Will pass this on to dayshift nurse for continued monitoring. ?

## 2022-02-16 NOTE — Progress Notes (Signed)
eLink Physician-Brief Progress Note ?Patient Name: Gabrielle Vincent ?DOB: Feb 23, 1966 ?MRN: [id number] ? ? ?Date of Service ? 02/16/2022  ?HPI/Events of Note ? Nursing request to change PO meds to IV as patient is NPO. AST and ALT are both elevated and patient has AKI (Creatinine = 2.13)  ?eICU Interventions ? Plan: ?D/C Tylenol. ?Ice packs PRN for fever, ?Other medications changed to IV as appropriate.   ? ? ? ?Intervention Category ?Major Interventions: Other: ? ?Connie Hilgert Cornelia Copa ?02/16/2022, 11:16 PM ?

## 2022-02-16 NOTE — Progress Notes (Signed)
Patient transported to and from MRI without incident.  

## 2022-02-16 NOTE — Significant Event (Addendum)
Rapid Response Event Note  ? ?Reason for Call :  ?Tachycardia, restlessness, increased pain. ? ?Initial Focused Assessment:  ?Patient writhing in bed, rolling over, crying out, pulling off gown, telemetry leads, pulling at IV, and inconsolable. Patient only responds with eye contact when she hears her name spoken loudly. Patient being treated for severe UTI and pyelonephritis as well as spinal problems with related chronic pain. No abnormal lung sounds, heart rate rapid but regular. Patient inability to remain still has prevented her from being able to receive neurosurgical interventions and MRI. Patient incontinent of urine during this time. Patient reportedly up to bedside commode multiple times this shift with significant yet unmeasured urine output. Suspect pain is multi-focal from spine and bladder. Dr. Hal Hope agreed and came to bedside. ? ?Interventions:  ?Focused assessment and chart review. Vitals obtained and significant for temperature 99.7 F rectally and HR 136 (see flowsheet). Assisted staff in repositioning patient, performing EKG, obtaining CBG, and maintaining patient's safety. EKG showed sinus tachycardia and CBG was 248. Per Dr. Moise Boring verbal order while at bedside, additional dose of Fentanyl administered for pain (see MAR). Bladder scan performed revealing 521 ml. Orders for I/O catheterization obtained from Dr. Hal Hope and procedure completed. Evacuated 178m green urine from bladder and observed urine still trickling out due to incontinence. Patient became more restful with less moaning out within minutes of completing catheterization and decreased heart rate. ? ?Plan of Care:  ?Dr. KHal Hopeinformed of outcome of catheterization. No further new orders. Rapid response instructed nurse to continue to monitor vitals and urine output (including re-scanning bladder), give PRN pain meds as needed. Bedside RN will call rapid nurse if any worsening or change in condition. Patient to  remain in current bed assignment at this time. ? ?Event Summary:  ? ?MD Notified: Dr. KHal Hope?Call Time: 0037 ?Arrival Time: 056?End Time: 05956? ?ASelinda Michaels RN ? ?Addendum at [phone] informed that patient has greater than [place] on bladder scan and Dr. KaHal Hopeave verbal order for repeat in/out cath. Advised bedside RN to perform task and report at 7am that indwelling foley may be needed. ?

## 2022-02-16 NOTE — Plan of Care (Signed)
  Problem: Coping: Goal: Level of anxiety will decrease Outcome: Progressing   Problem: Pain Managment: Goal: General experience of comfort will improve Outcome: Progressing   Problem: Safety: Goal: Ability to remain free from injury will improve Outcome: Progressing   Problem: Skin Integrity: Goal: Risk for impaired skin integrity will decrease Outcome: Progressing   

## 2022-02-16 NOTE — Progress Notes (Signed)
?   02/16/22 0035  ?Provider Notification  ?Provider Name/Title Gean Birchwood, MD  ?Date Provider Notified 02/16/22  ?Time Provider Notified [phone]  ?Method of Notification Page  ?Notification Reason Change in status  ?Provider response At bedside;See new orders  ?Date of Provider Response 02/16/22  ?Time of Provider Response 0040  ?Rapid Response Notification  ?Name of Rapid Response RN Notified Russella Dar., RN (Rapid Response RN)  ?Date Rapid Response Notified 02/16/22  ?Time Rapid Response Notified 0035  ? ?Notified on call provider and Rapid Response RN due to patient's heart rate getting up into the 140's. Patient had been tossing and turning, as well as being restless. PRN Fentanyl had been given due to patient being in pain at 2321. On call provider put in new orders, as well as came to the patient's bedside to evaluate. On call provider had instructed nurse to give another dose of PRN Fentanyl. Patient's vital signs were obtained, as well as getting another CBG (which was 248). During Rapid Response's assessment, nurse was instructed to do a bladder scan on patient just to see if patient had urine in the bladder. Bladder scan showed 521 mL in patient's bladder. Notified on call provider, and on call provider put in an order for an in and out cath. In and out cath was performed and 1750 mL was removed from the patient's bladder. Patient settled down some once in and out cath was completed. Rapid response instructed nurse to continue to monitor, as well as continue to give PRN pain meds. ?

## 2022-02-16 NOTE — Progress Notes (Signed)
Central Venous Catheter Insertion Procedure Note ? ?Gabrielle Vincent  ?[id number]  ?Oct 24, 1965 ? ?Date:02/16/22  ?Time:5:14 PM  ? ?Provider Performing:Pete E Kary Kos  ? ?Procedure: Insertion of Non-tunneled Central Venous Catheter(36556) with US guidance (78588)  ? ?Indication(s) ?Difficult access ? ?Consent ?Unable to obtain consent due to emergent nature of procedure. ? ?Anesthesia ?Topical only with 1% lidocaine  ? ?Timeout ?Verified patient identification, verified procedure, site/side was marked, verified correct patient position, special equipment/implants available, medications/allergies/relevant history reviewed, required imaging and test results available. ? ?Sterile Technique ?Maximal sterile technique including full sterile barrier drape, hand hygiene, sterile gown, sterile gloves, mask, hair covering, sterile ultrasound probe cover (if used). ? ?Procedure Description ?Area of catheter insertion was cleaned with chlorhexidine and draped in sterile fashion.  With real-time ultrasound guidance a central venous catheter was placed into the right internal jugular vein. Nonpulsatile blood flow and easy flushing noted in all ports.  The catheter was sutured in place and sterile dressing applied. ? ?Complications/Tolerance ?None; patient tolerated the procedure well. ?Chest X-ray is ordered to verify placement for internal jugular or subclavian cannulation.   Chest x-ray is not ordered for femoral cannulation. ? ?EBL ?Minimal ? ?Specimen(s) ?None  ? ?Erick Colace ACNP-BC ?[place] ?Pager # [phone] OR # [phone] if no answer ? ?

## 2022-02-16 NOTE — Consult Note (Addendum)
Neurology Consultation ? ?Reason for Consult: AMS/ possible stroke  ?Referring Physician: Dr. Tana Coast ? ?CC: back pain  ? ?History is obtained from:RN, sister at bedside and medical record  ? ?HPI: Gabrielle Vincent is a 57 y.o. female with past medical history of HLD, seasonal allergies and back pain who presents to the ED on 5/3  for evaluation of severe low back pain radiating down to her left lower extremity, associated with hematuria and dysuria. Per sister at the bedside she was alert and oriented when she went to the ED. Yesterday she was given ativan for a procedure and since the ativan patient has been altered. CT head revealed (Severely limited study) Question area of hypodensity in the right basal ganglia, which could potentially represent an acute/recent infarct.  ?On exam patient is writhing around the bed, laying on her right side, sort of moaning and groaning. She settles down with voice for a few seconds. She does not follow commands, and no verbal output. She moves all extremities and is purposeful with movements. Neurology consulted  ? ? ?LKW: 02/15/2022 ?tpa given?: no, outside window  ?Premorbid modified Rankin scale (mRS):  ?0-Completely asymptomatic and back to baseline post-stroke ? ? ?ROS:  Unable to obtain due to altered mental status.  ? ?Past Medical History:  ?Diagnosis Date  ? Aortic atherosclerosis ([place]) 02/14/2022  ? Class II obesity 02/14/2022  ? Diverticulosis 02/14/2022  ? Hepatic steatosis 02/14/2022  ? Hyperlipidemia 02/07/2016  ? Seasonal allergies   ? Swelling of lower extremity   ? bilateral  ? Varicose veins   ? right leg  ? ? ? ?History reviewed. No pertinent family history. ? ? ?Social History:  ? reports that she has never smoked. She does not have any smokeless tobacco history on file. She reports that she does not drink alcohol and does not use drugs. ? ?Medications ? ?Current Facility-Administered Medications:  ?  0.45 % sodium chloride infusion, , Intravenous, Continuous, Rise Patience, MD, Last Rate: 75 mL/hr at 02/16/22 0530, New Bag at 02/16/22 0530 ?  aspirin EC tablet 325 mg, 325 mg, Oral, Daily, Rai, Ripudeep K, MD, 325 mg at 02/16/22 0942 ?  cefTRIAXone (ROCEPHIN) 2 g in sodium chloride 0.9 % 100 mL IVPB, 2 g, Intravenous, Q24H, Rai, Ripudeep K, MD, Last Rate: 200 mL/hr at 02/16/22 1208, 2 g at 02/16/22 1208 ?  docusate sodium (COLACE) capsule 100 mg, 100 mg, Oral, BID, Rai, Ripudeep K, MD, 100 mg at 02/15/22 0748 ?  fentaNYL (SUBLIMAZE) injection 25 mcg, 25 mcg, Intravenous, Q3H PRN, Rai, Ripudeep K, MD, 25 mcg at 02/16/22 1011 ?  insulin aspart (novoLOG) injection 0-5 Units, 0-5 Units, Subcutaneous, QHS, Rai, Ripudeep K, MD, 2 Units at 02/15/22 2108 ?  insulin aspart (novoLOG) injection 0-9 Units, 0-9 Units, Subcutaneous, TID WC, Rai, Ripudeep K, MD, 2 Units at 02/16/22 1208 ?  MEDLINE mouth rinse, 15 mL, Mouth Rinse, BID, Rai, Ripudeep K, MD ?  metoprolol tartrate (LOPRESSOR) tablet 12.5 mg, 12.5 mg, Oral, BID, Rai, Ripudeep K, MD, 12.5 mg at 02/16/22 0942 ?  ondansetron (ZOFRAN) tablet 4 mg, 4 mg, Oral, Q6H PRN **OR** ondansetron (ZOFRAN) injection 4 mg, 4 mg, Intravenous, Q6H PRN, Rai, Ripudeep K, MD ?  oxyCODONE (Oxy IR/ROXICODONE) immediate release tablet 5 mg, 5 mg, Oral, Q6H PRN, Rai, Ripudeep K, MD ?  pantoprazole (PROTONIX) EC tablet 40 mg, 40 mg, Oral, Daily, Rai, Ripudeep K, MD, 40 mg at 02/16/22 0942 ?  polyethylene glycol (MIRALAX / GLYCOLAX)  packet 17 g, 17 g, Oral, Daily PRN, Rai, Ripudeep K, MD ?  Urelle (URELLE/URISED) 81 MG tablet 81 mg, 1 tablet, Oral, TID, Rai, Ripudeep K, MD, 81 mg at 02/16/22 0942 ? ? ?Exam: ?Current vital signs: ?BP (!) 178/94   Pulse (!) 136   Temp 98.7 ?F (37.1 ?C) (Oral)   Resp (!) 34   Ht '5\' 4"'$  (1.626 m)   Wt 95.3 kg   LMP  (LMP Unknown)   SpO2 94%   BMI 36.05 kg/m?  ?Vital signs in last 24 hours: ?Temp:  [97.9 ?F (36.6 ?C)-99.7 ?F (37.6 ?C)] 98.7 ?F (37.1 ?C) (05/05 1232) ?Pulse Rate:  [69-136] 136 (05/05 1232) ?Resp:  [18-34]  34 (05/05 1232) ?BP: (134-187)/(82-113) 178/94 (05/05 1232) ?SpO2:  [92 %-96 %] 94 % (05/05 1232) ? ?GENERAL: critically ill female  ?HEENT: - Normocephalic and atraumatic, dry mm ?LUNGS - Clear to auscultation bilaterally with no wheezes ?CV - S1S2 RRR, no m/r/g, equal pulses bilaterally. ?ABDOMEN - Soft, nontender, nondistended with normoactive BS ?Ext: warm, well perfused, intact peripheral pulses, no edema ? ?NEURO:  ?Mental Status: patient is restless in bed, gets easily agitated with stimuli. She will settle down for a moment when you call her name, will open eyes, constant moaning. She does not follow commands. Unable to visualize pupils as she is squeezing them shut and swats my hand away. Face appears symmetric. Moves all 4 extremities equally and withdraws to painful stimuli in all 4. She does localize  ?Motor: antigravity in all 4 extremities, moves spontaneously ?Tone: is normal and bulk is normal ?Sensation- Intact to light touch bilaterally ?Coordination: Unable to assses ?Gait- deferred ? ?Labs ?I have reviewed labs in epic and the results pertinent to this consultation  ?NA 152-->147-->131-->129-->127 on admission  ?K 3.3 ?Cl 120 ?BG 204 ?Bun 57-->58-->62--52-->52-->49 on admission  ?Cr 2.01-->2.0-->2.09-->2.78-->3.30-->3.64 on admission  ?Procalcitonin 11.98-->24.68-->35.18 on admission  ?AST 118-->118-->122-->94 on admission  ?ALT 82-->72-->66-->58 ?Tbili 2.2-->2.3-->2.3-->2.1 on admission  ?Ammonia 36 ?A1c 8.9 ?UA positive  ?Ucx >100,000 E Coli  ? ?Imaging ? ?I have reviewed the images obtained: ? ?CT-head 5/4: ?Severely limited study. Question area of hypodensity in the right ?basal ganglia, which could potentially represent an acute/recent ?infarct, but is not well characterized on this study ? ?Assessment:  ?Gabrielle Vincent is a 56 y.o. female with past medical history of HLD, seasonal allergies and back pain who presents to the ED on 5/3  for evaluation of severe low back pain radiating down to  her left lower extremity, associated with hematuria and dysuria. Per sister at the bedside she was alert and oriented when she went to the ED. certainly this could represent gram-negative encephalopathy, but with the acute change in severe symptoms, she needs further work-up including MRI and EEG. ? ? ?Recommendations: ?- MRI Brain when able  ?- Recheck ammonia level today  ?- Delirium precautions  ?- Limit sedating medications  ?- check EEG  ?- correct metabolic abnormalities per primary team  ?- continue to treat infection per primary team ? ?Beulah Gandy DNP, ACNPC-AG  ? ?I have seen the patient reviewed the above note.  When I saw her, she was in acute agitation, and was having to be held to start on an IO line to provide sedation.  Discussing with the ICU team, plan is to intubate her to allow for further work-up to be performed. ? ?With a gram-negative bacteremia, it is possible that this simply represents aggravated delirium due to gram-negative sepsis, as  endotoxin produced by gram-negative species is very deliriogenic.  With findings of the CT, there is question about a caudate head infarct which would certainly be contributory to encephalopathy. ? ?I think an MRI would be invaluable in further characterizing her delirium/encephalopathy and this would be the next step. ? ?An EEG would also be reasonable, though my suspicion for seizure is much lower. ? ?I will also check a B12 and TSH just to assess for other possible contributing risk factors. ? ?Neurology will continue to follow. ? ?Roland Rack, MD ?[facility] ?[phone] ? ?If 7pm- 7am, please page neurology on call as listed in [place]. ? ?

## 2022-02-16 NOTE — Progress Notes (Signed)
?      ? ? ? [facility] ?                                                                           ? ? ?Gabrielle Vincent, is a 56 y.o. female, DOB - Feb 01, 1966, FTD:[id number] ?Admit date - 02/14/2022    ?Outpatient Primary MD for the patient is Pcp, No ? ?LOS - 2  days ? ?Chief Complaint  ?Patient presents with  ? Back Pain  ?    ? ?Brief summary  ? ?Patient is a 56 year old female with history of seasonal allergies, obesity presented to ED with complaints of severe low back pain radiating down to her left lower extremity, associated with hematuria and dysuria. ?Patient reported that she has been having constant lower back pain, 10/10 over the weekend, progressively worsening, radiating down to her left leg.  No weakness in the legs however having difficulty ambulating due to pain.  She has not been eating too well, also noted urinary retention, decreased urination, hematuria and dysuria.  No acute fevers, no nausea vomiting or diarrhea. ?Patient went to the ED where labs were drawn which showed multiple electrolyte abnormalities and was referred for admission. ?Sodium 127, potassium 3.4, chloride 90, bicarb 18, creatinine 3.64, elevated LFTs, anion gap 19, WBCs 21.3, hemoglobin 15.9 ?  ?CT renal stone study showed no obstructive uropathy, severe fatty infiltration of the liver with mild hepatomegaly.  Cholelithiasis with no acute cholecystitis.  Colonic diverticulosis.  Mild circumferential thickening of the distal esophagus may represent esophagitis. ?Global enlargement of the uterus with thickened appearance of endometrial stripe measuring 2.0 cm, subserosal uterine fibroid, recommended nonemergent pelvic ultrasound ? ? ?Assessment & Plan  ? ? ?Principal Problem: ?Acute metabolic encephalopathy ?-Multifactorial, has UTI, likely pyelonephritis, acute kidney injury, hypernatremia, response to opioids, Ativan 1 dose on 5/4, metabolic derangements ?-Continue IV antibiotics ?-Obtain ABG, ammonia level, TSH, serial  bmet, sodium trending up ?-IV fluids changed to D5 half-normal, repeat bmet ?-Difficult situation as unable to give NSAIDs for pain due to AKI, unable to give Tylenol with elevated LFTs ?-Neurology consulted, discussed with Dr. Leonel Ramsay, awaiting recommendations from neurology.  CT head on 5/4 was limited study due to her restlessness, ?  Acute/subacute infarct.  MRI brain pending, continue aspirin 325 mg daily ?-Narcan 0.60m IV x1, patient not following any commands, discussed with CCM, will transfer to stepdown ?-Per family at the bedside, she does not drink alcohol or smoke ? ?  Sepsis secondary to E. coli UTI ([place], ?  Pyelonephritis with urinary retention ?-Patient met sepsis criteria on admission with tachycardia, tachypnea, leukocytosis, acute kidney injury, source likely UTI.  Elevated procalcitonin 35.18 ?-Urine culture showed E. coli, continue IV Rocephin ?- acute urinary retention overnight, 921 cc, requiring In&out cath, Foley catheter now placed ? ?Severe leukocytosis ?-Likely due to 2 doses of IV Solu-Medrol patient had received due to acute lumbar spine low back pain with radiculopathy on admission ?-Procalcitonin trending down, continue IV Rocephin, received vancomycin x1 today ? ?Acute lumbar spine low back pain with radiculopathy to LLE ?-IV morphine, Robaxin discontinued.  Narcotics were decreased on 5/4 due to worsening confusion. Unfortunately unable to give NSAIDs due to AKI.  ?-MRI  of the lumbar spine showed small left subarticular disc extrusion with inferior migration at L3-L4 narrowing the left lateral recess, moderate L4-5 facet arthrosis with widening of facets left greater than right, may be source of low back pain.  Small central disc protrusion at L5-S1 ?-Neurosurgery consulted, recommended ESI however unsuccessful as patient was too agitated and unable to tolerate the procedur ? ?  Hyponatremia, now hypernatremia ?-Patient initially presented with hyponatremia, now worsening to  hypernatremia ?-IV fluids changed to 0.45% this a.m. ?- serial bmet, showed sodium trending up to 152, fluids changed to D5 half-normal saline ? ?  Hypokalemia ?-Replaced IV ? ?  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 ?Acute transaminitis ?-Likely due to fatty liver, sepsis ?-CT scan and liver ultrasound showed fatty liver, cholelithiasis but no acute cholecystitis or biliary dilatation ? ?Esophagitis ?-Placed on Protonix IV ? ?Polycythemia, thrombocytopenia ?-Unclear etiology, hold Lovenox ?-Outpatient referral to hematology upon discharge. ? ?Hyperglycemia, diabetes mellitus type 2 new diagnosis ?-Patient reported no prior history of diabetes mellitus, however does not have a PCP -Hemoglobin A1c 8.9 consistent with new diagnosis of diabetes mellitus type 2 ?-Continue sliding scale insulin while inpatient ? ?Essential hypertension with tachycardia ?-Obtain EKG, placed on scheduled Lopressor 5 mg every 6 hours ? ? ?Obesity ?Estimated body mass index is 36.05 kg/m? as calculated from the following: ?  Height as of this encounter: 5' 4"  (1.626 m). ?  Weight as of this encounter: 95.3 kg. ? ?Code Status: Full CODE STATUS ?DVT Prophylaxis:  Place and maintain sequential compression device Start: 02/15/22 1026 ? ? ?Level of Care: Level of care: Stepdown ?Family Communication: Updated patient's brother and sister, brother-in-law at the bedside ? ? ?Disposition Plan:      Remains inpatient appropriate: Work-up in progress, transferring to stepdown unit due to worsening encephalopathy ? ?Procedures:  ?MRI lumbar spine ? ?Consultants:   ?Neurosurgery ?Neurology ?Infectious disease ?CCM ? ?Antimicrobials:  ? ?Anti-infectives (From admission, onward)  ? ? Start     Dose/Rate Route Frequency Ordered Stop  ? 02/17/22 1000  ceFAZolin  (ANCEF) IVPB 2g/100 mL premix       ? 2 g ?200 mL/hr over 30 Minutes Intravenous Every 12 hours 02/16/22 1322 02/21/22 0959  ? 02/16/22 1230  cefTRIAXone (ROCEPHIN) 2 g in sodium chloride 0.9 % 100 mL IVPB  Status:  Discontinued       ? 2 g ?200 mL/hr over 30 Minutes Intravenous Every 24 hours 02/16/22 0609 02/16/22 1322  ? 02/16/22 0815  vancomycin (VANCOREADY) IVPB 2000 mg/400 mL       ? 2,000 mg ?200 mL/hr over 120 Minutes Intravenous  Once 02/16/22 0721 02/16/22 1152  ? 02/15/22 1230  cefTRIAXone (ROCEPHIN) 1 g in sodium chloride 0.9 % 100 mL IVPB  Status:  Discontinued       ? 1 g ?200 mL/hr over 30 Minutes Intravenous Every 24 hours 02/14/22 1306 02/16/22 0609  ? 02/15/22 1200  Urelle (URELLE/URISED) 81 MG tablet 81 mg  Status:  Discontinued       ? 1 tablet Oral 3 times daily 02/15/22 1051 02/16/22 1411  ? 02/14/22 1230  cefTRIAXone (ROCEPHIN) 1 g in sodium chloride 0.9 % 100 mL IVPB       ? 1 g ?200 mL/hr over 30 Minutes Intravenous  Once 02/14/22 1225 02/14/22 1318  ? ?  ? ? ? ? ? ?  Medications ? aspirin EC  325 mg Oral Daily  ? Chlorhexidine Gluconate Cloth  6 each Topical Daily  ? docusate sodium  100 mg Oral BID  ? insulin aspart  0-15 Units Subcutaneous Q4H  ? mouth rinse  15 mL Mouth Rinse BID  ? metoprolol tartrate  5 mg Intravenous Q6H  ? naLOXone (NARCAN)  injection  0.2 mg Intravenous Once  ? pantoprazole (PROTONIX) IV  40 mg Intravenous Q24H  ? ? ? ? ?Subjective:  ? ?Gabrielle Vincent was seen and examined today.  Seen twice today, mental status worsening with jitteriness, not following any commands.  Family members at the bedside. ? ? ?Objective:  ? ?Vitals:  ? 02/16/22 1017 02/16/22 1117 02/16/22 1232 02/16/22 1403  ?BP: (!) 168/82 (!) 161/88 (!) 178/94 120/80  ?Pulse: (!) 129 (!) 128 (!) 136 (!) 145  ?Resp: (!) 32 (!) 28 (!) 34 (!) 40  ?Temp: 98.6 ?F (37 ?C) 98.8 ?F (37.1 ?C) 98.7 ?F (37.1 ?C) 98.6 ?F (37 ?C)  ?TempSrc: Axillary Axillary Axillary Axillary  ?SpO2: 96% 95% 94% 92%  ?Weight:       ?Height:      ? ? ?Intake/Output Summary (Last 24 hours) at 02/16/2022 1424 ?Last data filed at 02/16/2022 1404 ?Gross per 24 hour  ?Intake 1988.92 ml  ?Output 4925 ml  ?Net -2936.08 ml  ? ? ? ?Wt Readings from

## 2022-02-16 NOTE — Progress Notes (Signed)
Pharmacy Antibiotic Note ? ?Gabrielle Vincent is a 56 y.o. female admitted on 02/14/2022 with back pain found to have herniated nucleus pulposus for neurosurgery recommends epidural injection for pain. Patient with UTI, ID consulted. Pharmacy originally consulted for vancomycin dosing. Patient was on ceftriaxone as well. ? ?Plan: ?Patient received 2 g vanc, which has now been discontinued by ID. Ceftriaxone de-escalated to cefazolin. ? ?Height: '5\' 4"'$  (162.6 cm) ?Weight: 95.3 kg (210 lb) ?IBW/kg (Calculated) : 54.7 ? ?Temp (24hrs), Avg:98.4 ?F (36.9 ?C), Min:97.9 ?F (36.6 ?C), Max:99.7 ?F (37.6 ?C) ? ?Recent Labs  ?Lab 02/14/22 ?1040 02/14/22 ?1719 02/14/22 ?1820 02/15/22 ?0425 02/16/22 ?3729 02/16/22 ?0211 02/16/22 ?1143  ?WBC 21.3* 16.1*  --  23.3* 39.2*  --   --   ?CREATININE 3.64* 3.30*  --  2.78* 2.09* 2.00* 2.01*  ?LATICACIDVEN  --   --  1.9  --   --   --   --   ?  ?Estimated Creatinine Clearance: 35 mL/min (A) (by C-G formula based on SCr of 2.01 mg/dL (H)).   ? ?Allergies  ?Allergen Reactions  ? Erythromycin Anaphylaxis  ? Penicillins Nausea Only  ? Prednisone Other (See Comments)  ?  Lymph node swelling  ? ? ?Antimicrobials this admission: ?Cefazolin 5/5 >> ?Ceftriaxone 5/4 >> 5/5 ?Vancomycin 5/5 x 1 ? ?Microbiology results: ?5/4 BCx: ngtd ?5/3 UCx: >100k E.coli (R-amp/Unasyn)  ? ? ?Thank you for allowing pharmacy to be a part of this patient?s care. ? ?Tawnya Crook, PharmD, BCPS ?Clinical Pharmacist ?02/16/2022 1:44 PM ? ? ?

## 2022-02-16 NOTE — Procedures (Signed)
Intubation Procedure Note ? ?Gabrielle Vincent  ?[id number]  ?[birth year] ? ?Date:02/16/22  ?Time:5:13 PM  ? ?Provider Performing:Pete E Kary Kos  ? ? ?Procedure: Intubation (96789) ? ?Indication(s) ?Respiratory Failure ? ?Consent ?Unable to obtain consent due to emergent nature of procedure. ? ? ?Anesthesia ?Etomidate, Fentanyl, and Rocuronium ? ? ?Time Out ?Verified patient identification, verified procedure, site/side was marked, verified correct patient position, special equipment/implants available, medications/allergies/relevant history reviewed, required imaging and test results available. ? ? ?Sterile Technique ?Usual hand hygeine, masks, and gloves were used ? ? ?Procedure Description ?Patient positioned in bed supine.  Sedation given as noted above.  Patient was intubated with endotracheal tube using Glidescope.  View was Grade 3 only epiglottis .  Number of attempts was  2 .  Colorimetric CO2 detector was consistent with tracheal placement. ? ? ?Complications/Tolerance ?None; patient tolerated the procedure well. ?Chest X-ray is ordered to verify placement. ? ? ?EBL ?Minimal ? ? ?Specimen(s) ?None  ?

## 2022-02-16 NOTE — Consult Note (Signed)
? ?NAME:  Thi Klich, MRN:  [id number], DOB:  09/06/1966, LOS: 2 ?ADMISSION DATE:  02/14/2022, CONSULTATION DATE:  5/5 ?REFERRING MD:  Rai, CHIEF COMPLAINT:  acute encephalopathy and sepsis   ? ?History of Present Illness:  ?56 year old female who was admitted on 5/3 with chief complaint of severe low back pain radiating down her left leg.  Also had associated hematuria and dysuria.  She had no weakness although low did say pain impacted her ability to walk.  Also noted poor p.o. intake, decreased urination as well as urinary retention, hematuria, dysuria, and some nausea.  ?CT scan CT scan was negative for obstructive uropathy did show fatty liver infiltrates, there is cholelithiasis but no acute cholecystitis there is mild circumferential thickening of the distal esophagus global enlargement of the uterus with thickened appearance of the endometrial stripe with radiology recommending nonemergent pelvic ultrasound.  Diagnostic evaluation notable for new AKI with serum creatinine 3.64, acute transaminitis leukocytosis, and hyponatremia of 127 she was admitted for further evaluation, cultures were sent, and she was started on IV ceftriaxone. ?On 5 4 patient was noted to be more confused, speech was slurred , Working diagnosis was #1 urinary tract infection with resultant sepsis and also low back pain for which neurosurgery was consulted as MRI finding showed acute herniated L3-L4 disc with left lumbar radiculopathy.  It was felt that pain control via lumbar injection may help in that this could be treated conservatively. ?On 5/4 patient becoming intermittently agitated then lethargic, because of this a CT of head was obtained this was severely limited due to motion degraded meant but there was concern about hypodense area in the right basal ganglia section raising concern for acute infarct.  On 5/5 a rapid response was called the patient was severely agitated, pulled out IVs, pull out Foley catheter ?Later that  afternoon mental status continued to worsen.  Neurology consult was obtained in addition to this infectious disease was consulted with urine growing E. coli and narrowed antibiotics to cefazolin stopped vancomycin and recommended supportive care because her mental status continued to decline, she had severe metabolic derangements, and we were unable to provide medical care on the [place] ward she was transferred to the ICU for higher level of care.  On initial arrival she was agitated requiring several nurses to hold her down, tachypneic tachycardic would not follow commands had marked increased work of breathing.  An intraosseous line was placed in the right lower extremity, she was intubated for airway protection to facilitate further MRI imaging, and a right IJ triple-lumen catheter was placed ?Pertinent  Medical History  ? ?Class II obesity, hepatic steatosis, seasonal allergies, varicose veins diverticulosis, HL  ? ?Significant Hospital Events: ?Including procedures, antibiotic start and stop dates in addition to other pertinent events   ?5/3 admitted with back pain and urinary tract infection , Further complicated by acute kidney injury hyponatremia and multiple metabolic derangements.  CT imaging for renal stones showed no acute abdominal pelvic findings there was no obstructive uropathy there was severe fatty liver disease there was cholelithiasis but no cholecystitis there is colonic diverticulosis but no diverticulitis, mild circumferential thickening of the esophagus globular enlargement of the uterus with radiology recommending nonemergent pelvic ultrasound found to have uterine fibroid abdominal Ultrasound showed fatty liver but was epididymides negative.  An MRI of the lumbar spine showed small left subarticular disc extrusion with inferior migration at L3-L4 correlating with radiculopathy.  She was started on ceftriaxone cultures were sent ?5/4 increased confusion CT  brain obtained raising concern for  possible acute infarct in the right basal ganglia ?5/5 progressive encephalopathy , Worsening leukocytosis, hypernatremia, hyperchloremia, slowly improving renal function, slowly improving procalcitonin, moved to ICU due to delirium, intubated for airway protection and to facilitate MRI imaging right IJ central line placed due to limited IV access.  Seen by neurology, also seen by infectious disease with ceftriaxone changed to cefazolin ? ?Interim History / Subjective:  ?Now heavily sedated ? ?Objective   ?Blood pressure (Abnormal) 147/78, pulse (Abnormal) 138, temperature 98.6 ?F (37 ?C), temperature source Axillary, resp. rate (Abnormal) 25, height '5\' 4"'$  (1.626 m), weight 95.3 kg, SpO2 95 %. ?   ?Vent Mode: PRVC ?FiO2 (%):  [100 %] 100 % ?Set Rate:  [24 bmp] 24 bmp ?Vt Set:  [440 mL] 440 mL ?PEEP:  [5 cmH20] 5 cmH20 ?Plateau Pressure:  [14 cmH20] 14 cmH20  ? ?Intake/Output Summary (Last 24 hours) at 02/16/2022 1649 ?Last data filed at 02/16/2022 1404 ?Gross per 24 hour  ?Intake 1988.92 ml  ?Output 4925 ml  ?Net -2936.08 ml  ? ?Filed Weights  ? 02/14/22 1001  ?Weight: 95.3 kg  ? ? ?Examination: ?General: 56 year old female currently sedated on mechanical ventilator ?HENT: Normocephalic atraumatic pupils equal reactive tongue pain did blue, dry ?Lungs: Clear tachypneic decreased bases ?Cardiovascular: Tachycardic regular rhythm without murmur rub or gallop ?Abdomen: Soft not tender ?Extremities: Warm dry ?Neuro: Moving all extremities no focal deficits very confused and agitated ?GU: Clear yellow urine ? ?Resolved Hospital Problem list   ? ? ?Assessment & Plan:  ?Principal Problem: ?  Sepsis secondary to UTI [facility]) ?Active Problems: ?  Hyponatremia ?  Hypokalemia ?  AKI (acute kidney injury) ([place]) ?  Abnormal LFTs ?  Class II obesity ?  Hepatic steatosis ?  Cholelithiasis ?  Esophageal thickening ?  Acute low back pain ?  Encephalopathy acute ?  Acute respiratory failure ([place]) ?  Radiculopathy of lumbar region ?  E. coli  UTI ? ?Acute metabolic encephalopathy.  Favor secondary to sepsis, complicated further by  multiple metabolic derangements, and pain..  CT imaging raising concern about possible right basal ganglial infarct however imaging was of poor quality due to motion artifact unclear to what extent this is contributing ?Plan ?Supportive care ?PAD protocol, RASS goal -2 starting propofol and fentanyl infusions ?Correct acid-base and water balance follow-up TSH ?MRI brain ?Treat infection ? ?Acute low back pain with radiculopathy secondary to acute herniated nucleus pulposus  ?Plan ?Pain management ?Conservative care per n-surg ? ?Sepsis 2/2 e-coli Urinary trach infection  ?Plan ?Cefazolin w/ end date planned for 5/10 ?Cont IV hydration  ? ?Acute respiratory failure. No longer protecting airway. Intubated for airway protection and to facilitate MRI imaging ?Plan ?Full vent support  ?PAD protocol  ?VAP bundle  ?F/u chest film  ?F/u abg  ? ?Difficult airway ?Airway was anterior and had significant swelling ?Plan ?Would consider leak test before extubation  ? ?AKI 2/2 sepsis ->improving  ?Plan ?Cont IV hydration ?Renal dose meds ?Strict I&O ?Keep foley for now ? ?Fluid and electrolyte imbalance: hypernatremia, hyperchloremia, normal ag metabolic acidosis ?-free water def: 4.75 liters  ?Plan ?Dc d5 1/2  ?Change to LR @ 50 and also add Free water replacement (will be conservative given question of stroke and replace at 75 cc/hr which is less than 1/2 her def)  ?Serial chems  ? ?Severe leukocytosis. -got 2 doses of steroids so this may be contributing ?Plan ?Trend cbc ? ?Abnormal LFTs superimposed on known  fatty liver disease  ?Plan ?Trend  ? ?Hyperglycemia ?Plan ?Ssi  ?Best Practice (right click and "Reselect all SmartList Selections" daily)  ? ?Diet/type: NPO ?DVT prophylaxis: prophylactic heparin  ?GI prophylaxis: PPI ?Lines: Central line ?Foley:  Yes, and it is still needed ?Code Status:  full code ?Last date of multidisciplinary  goals of care discussion [pending] ? ?Labs   ?CBC: ?Recent Labs  ?Lab 02/14/22 ?1040 02/14/22 ?1719 02/15/22 ?0425 02/16/22 ?0416  ?WBC 21.3* 16.1* 23.3* 39.2*  ?NEUTROABS 18.0*  --   --  31.5*  ?HGB 15

## 2022-02-16 NOTE — Progress Notes (Signed)
?   02/16/22 0550  ?Assess: MEWS Score  ?Temp 97.9 ?F (36.6 ?C)  ?BP (!) 160/87  ?Pulse Rate (!) 127  ?Resp 19  ?Level of Consciousness Responds to Voice  ?SpO2 95 %  ?O2 Device Nasal Cannula  ?Patient Activity (if Appropriate) In bed  ?O2 Flow Rate (L/min) 4 L/min  ?Assess: MEWS Score  ?MEWS Temp 0  ?MEWS Systolic 0  ?MEWS Pulse 2  ?MEWS RR 0  ?MEWS LOC 1  ?MEWS Score 3  ?MEWS Score Color Yellow  ?Assess: if the MEWS score is Yellow or Red  ?Were vital signs taken at a resting state? Yes  ?Focused Assessment No change from prior assessment  ?Does the patient meet 2 or more of the SIRS criteria? Yes  ?Does the patient have a confirmed or suspected source of infection? Yes  ?Provider and Rapid Response Notified? Yes  ?MEWS guidelines implemented *See Row Information* No, previously yellow, continue vital signs every 4 hours  ?Treat  ?MEWS Interventions Other (Comment)  ?Escalate  ?MEWS: Escalate Yellow: discuss with charge nurse/RN and consider discussing with provider and RRT  ?Notify: Charge Nurse/RN  ?Name of Charge Nurse/RN Notified Lorri Frederick., RN  ?Date Charge Nurse/RN Notified 02/16/22  ?Time Charge Nurse/RN Notified [phone]  ?Notify: Provider  ?Provider Name/Title Gean Birchwood, MD  ?Date Provider Notified 02/16/22  ?Time Provider Notified [phone]  ?Notification Type Page  ?Notification Reason Change in status  ?Provider response See new orders  ?Date of Provider Response 02/16/22  ?Time of Provider Response [phone]  ?Notify: Rapid Response  ?Name of Rapid Response RN Notified Russella Dar., RN (Rapid Response RN)  ?Date Rapid Response Notified 02/16/22  ?Time Rapid Response Notified [phone]  ?Document  ?Progress note created (see row info) Yes  ?Assess: SIRS CRITERIA  ?SIRS Temperature  0  ?SIRS Pulse 1  ?SIRS Respirations  0  ?SIRS WBC 1  ?SIRS Score Sum  2  ? ? ?

## 2022-02-16 NOTE — Progress Notes (Signed)
Patient ID: Gabrielle Vincent, female   DOB: Feb 22, 1966, 56 y.o.   MRN: [id number] ?Patient has just been intubated.  She apparently had significant lethargy and then agitation and was not able to cooperate for epidural steroid injection.  She appears to be septic and is currently on a ventilator.  The nature of the disc herniation that is present is so small that I hardly believe it should be causing such intractable pain.  Once her medical issues are resolved and she can be extubated it would be interesting to see if her severe acute pain does not resolve with that.  Ultimately if she does require further therapy I will be glad to see her after this acute septic episode is resolved. ?

## 2022-02-16 NOTE — Progress Notes (Addendum)
Pt transported safety to and from MRI with respiratory and 2 other Rns on propofol and levophed ?

## 2022-02-17 ENCOUNTER — Inpatient Hospital Stay (HOSPITAL_COMMUNITY): Payer: BC Managed Care – PPO

## 2022-02-17 DIAGNOSIS — A419 Sepsis, unspecified organism: Secondary | ICD-10-CM | POA: Diagnosis not present

## 2022-02-17 DIAGNOSIS — N390 Urinary tract infection, site not specified: Secondary | ICD-10-CM | POA: Diagnosis not present

## 2022-02-17 DIAGNOSIS — G9340 Encephalopathy, unspecified: Secondary | ICD-10-CM | POA: Diagnosis not present

## 2022-02-17 LAB — COMPREHENSIVE METABOLIC PANEL
ALT: 72 U/L — ABNORMAL HIGH (ref 0–44)
ALT: 58 U/L — ABNORMAL HIGH (ref 0–44)
AST: 63 U/L — ABNORMAL HIGH (ref 15–41)
AST: 40 U/L (ref 15–41)
Albumin: 2 g/dL — ABNORMAL LOW (ref 3.5–5.0)
Albumin: 1.8 g/dL — ABNORMAL LOW (ref 3.5–5.0)
Alkaline Phosphatase: 214 U/L — ABNORMAL HIGH (ref 38–126)
Alkaline Phosphatase: 183 U/L — ABNORMAL HIGH (ref 38–126)
Anion gap: 11 (ref 5–15)
Anion gap: 9 (ref 5–15)
BUN: 62 mg/dL — ABNORMAL HIGH (ref 6–20)
BUN: 57 mg/dL — ABNORMAL HIGH (ref 6–20)
CO2: 19 mmol/L — ABNORMAL LOW (ref 22–32)
CO2: 19 mmol/L — ABNORMAL LOW (ref 22–32)
Calcium: 8.6 mg/dL — ABNORMAL LOW (ref 8.9–10.3)
Calcium: 8 mg/dL — ABNORMAL LOW (ref 8.9–10.3)
Chloride: 127 mmol/L — ABNORMAL HIGH (ref 98–111)
Chloride: 124 mmol/L — ABNORMAL HIGH (ref 98–111)
Creatinine, Ser: 2.3 mg/dL — ABNORMAL HIGH (ref 0.44–1.00)
Creatinine, Ser: 2.24 mg/dL — ABNORMAL HIGH (ref 0.44–1.00)
GFR, Estimated: 24 mL/min — ABNORMAL LOW (ref >60)
GFR, Estimated: 25 mL/min — ABNORMAL LOW (ref >60)
Glucose, Bld: 202 mg/dL — ABNORMAL HIGH (ref 70–99)
Glucose, Bld: 203 mg/dL — ABNORMAL HIGH (ref 70–99)
Potassium: 3.6 mmol/L (ref 3.5–5.1)
Potassium: 3.3 mmol/L — ABNORMAL LOW (ref 3.5–5.1)
Sodium: 157 mmol/L — ABNORMAL HIGH (ref 135–145)
Sodium: 152 mmol/L — ABNORMAL HIGH (ref 135–145)
Total Bilirubin: 1.5 mg/dL — ABNORMAL HIGH (ref 0.3–1.2)
Total Bilirubin: 1.2 mg/dL (ref 0.3–1.2)
Total Protein: 5.6 g/dL — ABNORMAL LOW (ref 6.5–8.1)
Total Protein: 5.5 g/dL — ABNORMAL LOW (ref 6.5–8.1)

## 2022-02-17 LAB — GLUCOSE, CAPILLARY
Glucose-Capillary: 192 mg/dL — ABNORMAL HIGH (ref 70–99)
Glucose-Capillary: 205 mg/dL — ABNORMAL HIGH (ref 70–99)
Glucose-Capillary: 227 mg/dL — ABNORMAL HIGH (ref 70–99)
Glucose-Capillary: 231 mg/dL — ABNORMAL HIGH (ref 70–99)
Glucose-Capillary: 258 mg/dL — ABNORMAL HIGH (ref 70–99)
Glucose-Capillary: 154 mg/dL — ABNORMAL HIGH (ref 70–99)
Glucose-Capillary: 200 mg/dL — ABNORMAL HIGH (ref 70–99)
Glucose-Capillary: 242 mg/dL — ABNORMAL HIGH (ref 70–99)

## 2022-02-17 LAB — BLOOD GAS, ARTERIAL
Acid-base deficit: 6.3 mmol/L — ABNORMAL HIGH (ref 0.0–2.0)
Bicarbonate: 18.1 mmol/L — ABNORMAL LOW (ref 20.0–28.0)
FIO2: 70 %
MECHVT: 440 mL
O2 Saturation: 100 %
PEEP: 5 cmH2O
Patient temperature: 38.1
RATE: 24 resp/min
pCO2 arterial: 34 mmHg (ref 32–48)
pH, Arterial: 7.34 — ABNORMAL LOW (ref 7.35–7.45)
pO2, Arterial: 165 mmHg — ABNORMAL HIGH (ref 83–108)

## 2022-02-17 LAB — SODIUM
Sodium: 156 mmol/L — ABNORMAL HIGH (ref 135–145)
Sodium: 154 mmol/L — ABNORMAL HIGH (ref 135–145)
Sodium: 154 mmol/L — ABNORMAL HIGH (ref 135–145)

## 2022-02-17 LAB — CBC WITH DIFFERENTIAL/PLATELET
Abs Immature Granulocytes: 2.98 10*3/uL — ABNORMAL HIGH (ref 0.00–0.07)
Basophils Absolute: 0 10*3/uL (ref 0.0–0.1)
Basophils Relative: 0 %
Eosinophils Absolute: 0 10*3/uL (ref 0.0–0.5)
Eosinophils Relative: 0 %
HCT: 41.4 % (ref 36.0–46.0)
Hemoglobin: 14 g/dL (ref 12.0–15.0)
Immature Granulocytes: 9 %
Lymphocytes Relative: 13 %
Lymphs Abs: 4.5 10*3/uL — ABNORMAL HIGH (ref 0.7–4.0)
MCH: 29.9 pg (ref 26.0–34.0)
MCHC: 33.8 g/dL (ref 30.0–36.0)
MCV: 88.3 fL (ref 80.0–100.0)
Monocytes Absolute: 1.5 10*3/uL — ABNORMAL HIGH (ref 0.1–1.0)
Monocytes Relative: 4 %
Neutro Abs: 25.3 10*3/uL — ABNORMAL HIGH (ref 1.7–7.7)
Neutrophils Relative %: 74 %
Platelets: 107 10*3/uL — ABNORMAL LOW (ref 150–400)
RBC: 4.69 MIL/uL (ref 3.87–5.11)
RDW: 17.9 % — ABNORMAL HIGH (ref 11.5–15.5)
WBC: 34.3 10*3/uL — ABNORMAL HIGH (ref 4.0–10.5)
nRBC: 0.2 % (ref 0.0–0.2)

## 2022-02-17 LAB — LACTIC ACID, PLASMA
Lactic Acid, Venous: 1.6 mmol/L (ref 0.5–1.9)
Lactic Acid, Venous: 1.3 mmol/L (ref 0.5–1.9)

## 2022-02-17 LAB — TRIGLYCERIDES: Triglycerides: 422 mg/dL — ABNORMAL HIGH (ref <150)

## 2022-02-17 IMAGING — DX DG CHEST 1V PORT
1 series · 1 of 1 positions shown · non-contrast
Comparison: [DATE]

CLINICAL DATA: Sepsis due to UTI.

EXAM:
PORTABLE CHEST 1 VIEW

[chest ap]
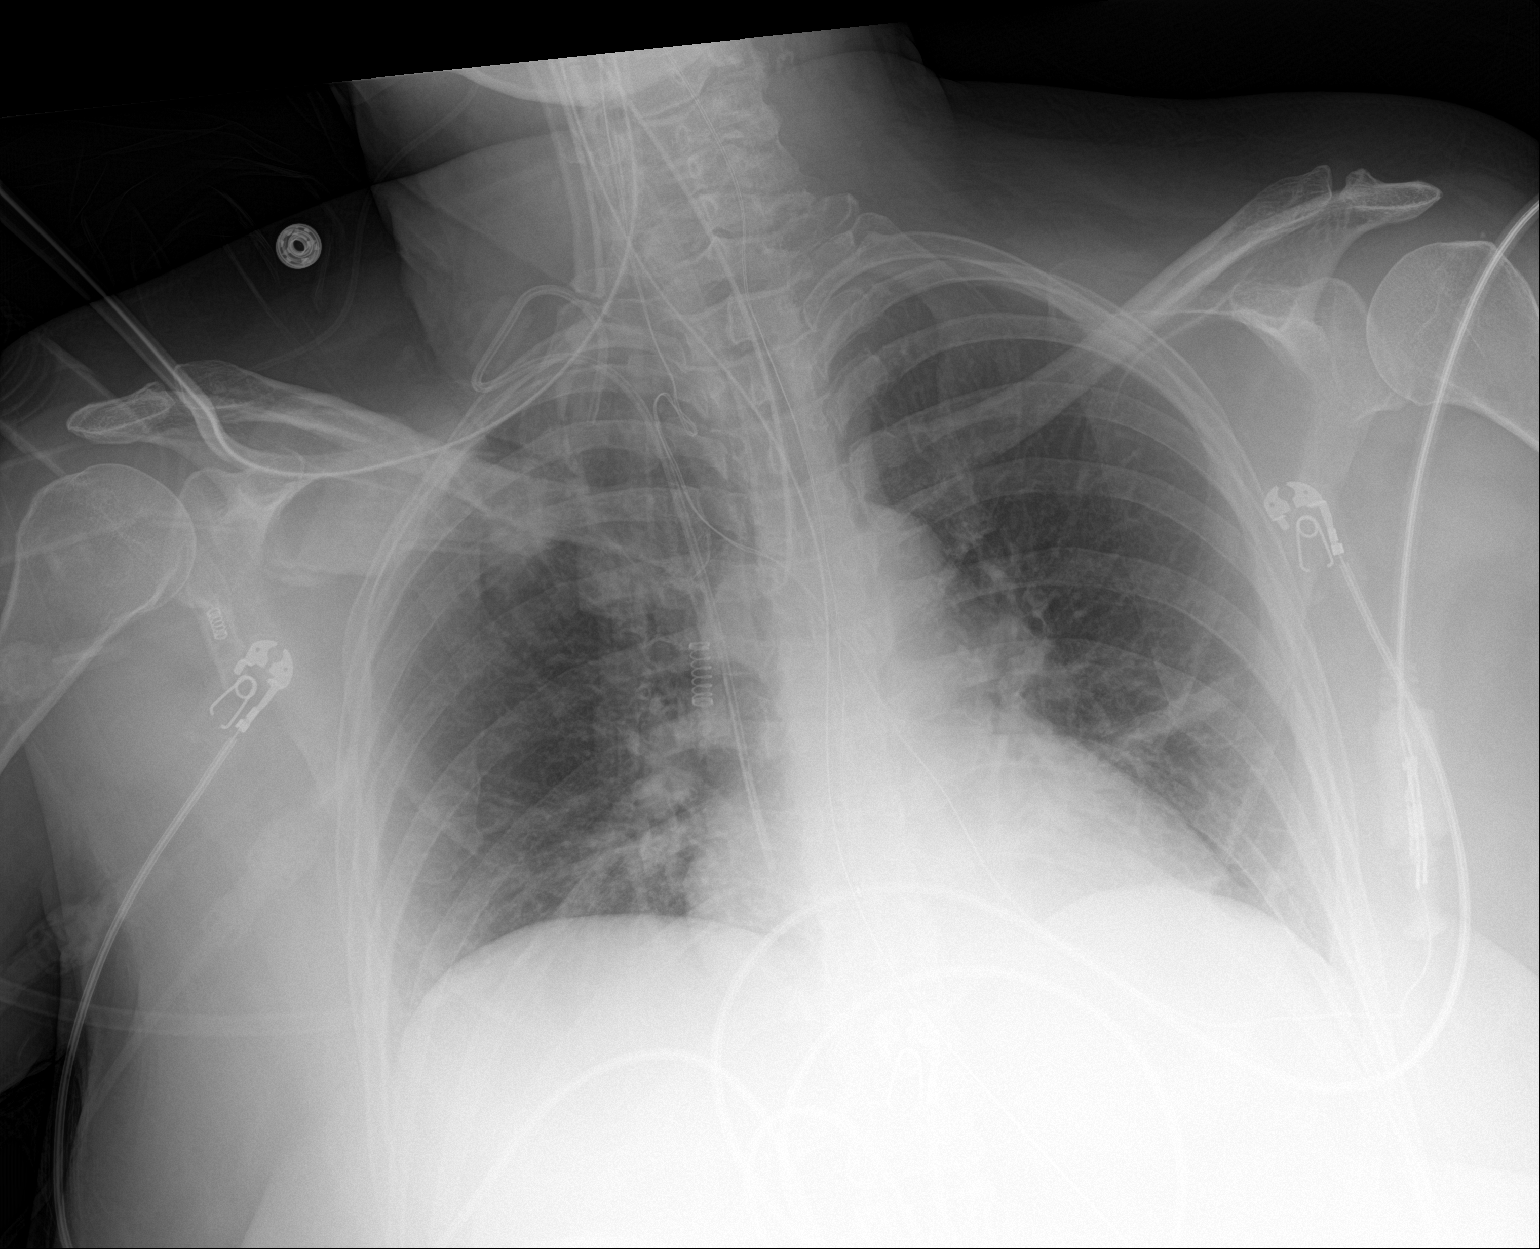

[1 of 1 positions shown; findings below may reference images not displayed]

FINDINGS: Enteric tube courses into the region of the stomach and off the film
as tip is not visualized. Endotracheal tube unchanged. Right IJ
central venous catheter unchanged.

Lungs are hypoinflated with mild linear atelectasis over the left
midlung. No evidence of effusion or pneumothorax. Cardiomediastinal
silhouette and remainder of the exam is unchanged.
IMPRESSION: 1. Hypoinflation with minimal linear atelectasis left midlung.
2. Tubes and lines as described.

## 2022-02-17 MED ORDER — CHLORHEXIDINE GLUCONATE CLOTH 2 % EX PADS
6.0000 | MEDICATED_PAD | Freq: Every day | CUTANEOUS | Status: DC
Start: 2022-02-17 — End: 2022-03-21
  Administered 2022-02-17 – 2022-03-21 (×31): 6 via TOPICAL

## 2022-02-17 MED ORDER — DEXTROSE 5 % IV SOLN
550.0000 mg | Freq: Two times a day (BID) | INTRAVENOUS | Status: DC
  Administered 2022-02-17 – 2022-02-19 (×6): 550 mg via INTRAVENOUS
  Filled 2022-02-17 (×7): qty 11

## 2022-02-17 MED ORDER — SODIUM CHLORIDE 0.9 % IV SOLN
2.0000 g | Freq: Two times a day (BID) | INTRAVENOUS | Status: DC
  Administered 2022-02-17 – 2022-02-19 (×5): 2 g via INTRAVENOUS
  Filled 2022-02-17 (×5): qty 12.5

## 2022-02-17 MED ORDER — SODIUM CHLORIDE 0.9 % IV SOLN
2.0000 g | INTRAVENOUS | Status: DC
  Filled 2022-02-17 (×2): qty 2000

## 2022-02-17 MED ORDER — VANCOMYCIN HCL 1250 MG/250ML IV SOLN
1250.0000 mg | INTRAVENOUS | Status: DC
  Administered 2022-02-17 – 2022-02-19 (×3): 1250 mg via INTRAVENOUS
  Filled 2022-02-17 (×4): qty 250

## 2022-02-17 MED ORDER — SODIUM CHLORIDE 0.9 % IV SOLN
2.0000 g | Freq: Four times a day (QID) | INTRAVENOUS | Status: DC
  Administered 2022-02-17 – 2022-02-20 (×12): 2 g via INTRAVENOUS
  Filled 2022-02-17 (×14): qty 2000

## 2022-02-17 MED ORDER — ACETAMINOPHEN 160 MG/5ML PO SOLN
500.0000 mg | Freq: Four times a day (QID) | ORAL | Status: DC | PRN
  Administered 2022-02-20 – 2022-03-13 (×33): 500 mg
  Filled 2022-02-17 (×32): qty 20.3

## 2022-02-17 MED ORDER — SODIUM CHLORIDE 0.9 % IV SOLN: 2.0000 g | Freq: Three times a day (TID) | INTRAVENOUS | Status: DC

## 2022-02-17 NOTE — Progress Notes (Signed)
Subjective: ?MRI did not reveal any stroke, CT finding was artifactual ? ?Exam: ?Vitals:  ? 02/17/22 1930 02/17/22 2009  ?BP: (!) 101/47 (!) 90/43  ?Pulse: (!) 109 (!) 107  ?Resp: (!) 27 (!) 24  ?Temp: (!) 100.6 ?F (38.1 ?C) (!) 100.6 ?F (38.1 ?C)  ?SpO2: 99% 98%  ? ?Gen: In bed, neck is not particularly meningitic ? ?Neuro: ?MS: Does not open eyes or follow commands ?CN: Pupils are reactive bilaterally ?Motor: flexion vs withdrawal x 4.  ?Sensory:as above ? ?Pertinent Labs: ?Creatinine 2.24 ?Sodium 152 ?Albumin 1.8 ? ?MRI brain-no stroke, but there is some concern for ventricular debris ? ?Impression: 56 year old female with acute encephalopathy in the setting of gram-negative bacteremia.  It is possible that this simply represents gram-negative encephalopathy, but with the findings on the MRI, I do think she needs lumbar puncture at this time.  Due to expected difficulty, there is going to be some delay to have it done under radiology, and therefore she is being covered empirically. ? ?Recommendations: ?1) LP for cells, glucose, protein, cultures ?2) agree with empiric coverage  ?3) continue treatment of her underlying encephalopathy ?4) we will use ceribell EEG, if this is negative do not feel she needs transfer ?5) neurology will continue to follow ? ?Roland Rack, MD ?[facility] ?[phone] ? ?If 7pm- 7am, please page neurology on call as listed in [place]. ? ?

## 2022-02-17 NOTE — Progress Notes (Signed)
? ?NAME:  Gabrielle Vincent, MRN:  [id number], DOB:  08-26-1966, LOS: 3 ?ADMISSION DATE:  02/14/2022, CONSULTATION DATE:  5/5 ?REFERRING MD:  Rai, CHIEF COMPLAINT:  acute encephalopathy and sepsis   ? ?History of Present Illness:  ?56 year old female who was admitted on 5/3 with chief complaint of severe low back pain radiating down her left leg.  Also had associated hematuria and dysuria.  She had no weakness although low did say pain impacted her ability to walk.  Also noted poor p.o. intake, decreased urination as well as urinary retention, hematuria, dysuria, and some nausea.  ? ?CT scan was negative for obstructive uropathy did show fatty liver infiltrates, there is cholelithiasis but no acute cholecystitis there is mild circumferential thickening of the distal esophagus global enlargement of the uterus with thickened appearance of the endometrial stripe with radiology recommending nonemergent pelvic ultrasound.  Diagnostic evaluation notable for new AKI with serum creatinine 3.64, acute transaminitis leukocytosis, and hyponatremia of 127 she was admitted for further evaluation, cultures were sent, and she was started on IV ceftriaxone. ? ?On 5/4 patient was noted to be more confused, speech was slurred , Working diagnosis was #1 urinary tract infection with resultant sepsis and also low back pain for which neurosurgery was consulted as MRI finding showed acute herniated L3-L4 disc with left lumbar radiculopathy.  It was felt that pain control via lumbar injection may help in that this could be treated conservatively. ?On 5/4 patient becoming intermittently agitated then lethargic, because of this a CT of head was obtained this was severely limited due to motion degraded meant but there was concern about hypodense area in the right basal ganglia section raising concern for acute infarct.  On 5/5 a rapid response was called the patient was severely agitated, pulled out IVs, pull out Foley catheter ?Later that  afternoon mental status continued to worsen.  Neurology consult was obtained in addition to this infectious disease was consulted with urine growing E. coli and narrowed antibiotics to cefazolin stopped vancomycin and recommended supportive care because her mental status continued to decline, she had severe metabolic derangements, and we were unable to provide medical care on the [place] ward she was transferred to the ICU for higher level of care.  On initial arrival she was agitated requiring several nurses to hold her down, tachypneic tachycardic would not follow commands had marked increased work of breathing.  An intraosseous line was placed in the right lower extremity, she was intubated for airway protection to facilitate further MRI imaging, and a right IJ triple-lumen catheter was placed ? ?Pertinent  Medical History  ? ?Class II obesity, hepatic steatosis, seasonal allergies, varicose veins diverticulosis, HL  ? ?Significant Hospital Events: ?Including procedures, antibiotic start and stop dates in addition to other pertinent events   ?5/3 admitted with back pain and urinary tract infection , Further complicated by acute kidney injury hyponatremia and multiple metabolic derangements.  CT imaging for renal stones showed no acute abdominal pelvic findings there was no obstructive uropathy there was severe fatty liver disease there was cholelithiasis but no cholecystitis there is colonic diverticulosis but no diverticulitis, mild circumferential thickening of the esophagus globular enlargement of the uterus with radiology recommending nonemergent pelvic ultrasound found to have uterine fibroid abdominal Ultrasound showed fatty liver but was epididymides negative.  An MRI of the lumbar spine showed small left subarticular disc extrusion with inferior migration at L3-L4 correlating with radiculopathy.  She was started on ceftriaxone cultures were sent ?5/4 increased confusion CT  brain obtained raising concern  for possible acute infarct in the right basal ganglia ?5/5 progressive encephalopathy , Worsening leukocytosis, hypernatremia, hyperchloremia, slowly improving renal function, slowly improving procalcitonin, moved to ICU due to delirium, intubated for airway protection and to facilitate MRI imaging right IJ central line placed due to limited IV access.  Seen by neurology, also seen by infectious disease with ceftriaxone changed to cefazolin ?5/6  ? ?Interim History / Subjective:  ? ? ? ?Objective   ?Blood pressure (!) 101/53, pulse 93, temperature 99.5 ?F (37.5 ?C), resp. rate (!) 24, height '5\' 4"'$  (1.626 m), weight 95.3 kg, SpO2 95 %. ?CVP:  [2 mmHg-14 mmHg] 6 mmHg  ?Vent Mode: PRVC ?FiO2 (%):  [40 %-100 %] 40 % ?Set Rate:  [24 bmp] 24 bmp ?Vt Set:  [440 mL] 440 mL ?PEEP:  [5 cmH20] 5 cmH20 ?Plateau Pressure:  [14 cmH20-15 cmH20] 14 cmH20  ? ?Intake/Output Summary (Last 24 hours) at 02/17/2022 0737 ?Last data filed at 02/17/2022 8280 ?Gross per 24 hour  ?Intake 2398.62 ml  ?Output 3750 ml  ?Net -1351.38 ml  ? ?Filed Weights  ? 02/14/22 1001  ?Weight: 95.3 kg  ? ? ?Examination: ?Blood pressure (!) 107/56, pulse 94, temperature 99.3 ?F (37.4 ?C), temperature source Esophageal, resp. rate (!) 24, height '5\' 4"'$  (1.626 m), weight 95.3 kg, SpO2 95 %. ?Gen:      No acute distress ?HEENT:  EOMI, sclera anicteric ?Neck:     No masses; no thyromegaly, ETT ?Lungs:    Clear to auscultation bilaterally; normal respiratory effort ?CV:         Regular rate and rhythm; no murmurs ?Abd:      + bowel sounds; soft, non-tender; no palpable masses, no distension ?Ext:    No edema; adequate peripheral perfusion ?Skin:      Warm and dry; no rash ?Neuro: Sedated, unresponsive ? ?Labs/imaging reviewed.   ?Significant for sodium 154, creatinine 2.30 ?WBC count 34.2, platelets 107 ? ?MRI with no acute infarction, layering material within the lateral ventricles ? ?Resolved Hospital Problem list   ? ? ?Assessment & Plan:  ?Principal Problem: ?   Sepsis secondary to UTI [facility]) ?Active Problems: ?  Hyponatremia ?  Hypokalemia ?  AKI (acute kidney injury) ([place]) ?  Abnormal LFTs ?  Class II obesity ?  Hepatic steatosis ?  Cholelithiasis ?  Esophageal thickening ?  Acute low back pain ?  Encephalopathy acute ?  Acute respiratory failure ([place]) ?  Radiculopathy of lumbar region ?  E. coli UTI ? ?Acute metabolic encephalopathy.  Favor secondary to sepsis, complicated further by  multiple metabolic derangements, and pain.  There is no evidence of infarct on MRI but images do raise the concern for meningitis ? ?Plan ?Supportive care ?PAD protocol, RASS goal -2 starting propofol and fentanyl infusions ?Correct acid-base and water balance follow-up TSH ?EEG ?On antibiotics to cover CNS, plan for LP ? ?Severe sepsis secondary to E. coli UTI ?We will need to add meningitis coverage and plan for LP as above ?Check lactic acid ? ?Acute low back pain with radiculopathy secondary to acute herniated nucleus pulposus  ?Plan ?Pain management ?Conservative care per n-surg ? ?Acute respiratory failure.  Intubated for respiratory protection  ?Plan ?ABG reviewed, continue vent support ?Follow intermittent chest x-ray ? ?Difficult airway ?Airway was anterior and had significant swelling ?Plan ?Would consider leak test before extubation  ? ?AKI 2/2 sepsis ?Plan ?Fluid hydration.  Give additional bolus of fluid ?Monitor I's/O ? ?Fluid and  electrolyte imbalance: hypernatremia, hyperchloremia, normal ag metabolic acidosis ?-free water def: 4.75 liters  ?Plan ?Continue D5 water.  Follow sodium ? ?Abnormal LFTs superimposed on known fatty liver disease  ?Plan ?Trend  ? ?Hyperglycemia ?Plan ?SSI ? ?Best Practice (right click and "Reselect all SmartList Selections" daily)  ? ?Diet/type: NPO ?DVT prophylaxis: prophylactic heparin  ?GI prophylaxis: PPI ?Lines: Central line ?Foley:  Yes, and it is still needed ?Code Status:  full code ?Last date of multidisciplinary goals of care discussion  [pending] ? ?Critical care time:   ? ?The patient is critically ill with multiple organ system failure and requires high complexity decision making for assessment and support, frequent evaluation and titration of the

## 2022-02-17 NOTE — Progress Notes (Signed)
Pharmacy Antibiotic Note ? ?Gabrielle Vincent is a 56 y.o. female admitted on 02/14/2022 with UTI with sepsis.  On 5/5 patient had progressive encephalopathy , Worsening leukocytosis, hypernatremia, hyperchloremia, slowly improving renal function, slowly improving procalcitonin, moved to ICU due to delirium, intubated for airway protection and to facilitate MRI imaging right IJ central line placed due to limited IV access.  Seen by neurology, also seen by infectious disease with ceftriaxone changed to cefazolin.  Today, there is no evidence of infarct on MRI but images do not exclude the possibility of meningitis.  Pharmacy has been consulted for vancomycin, cefepime, ampicillin, and acyclovir dosing for meningitis coverage. ? ?Dosing considers patient's BMI is greater than 30 kg/m2, ideal body wt 54.7 kg, and estimated creatinine clearance is 30.6 ml/min based on creatinine 2.3. ? ?Plan: ?Discontinue Ancef ?Cefepime 2 g IV every 12 hours for est crcl 30-60 ml/min ?Vancomycin 1250 mg IV every 24 hours for target trough 15-20 ?Ampicillin 2 g IV every 6 hours for est crcl 30-50 ml/min ?Acyclovir 550 mg IV every 12 hours, using IBW and est crcl 30-50 ml/min ?Monitor clinical progress, renal function, vancomycin trough if indicated ?F/U LP, HSV, C&S, abx deescalation / LOT ? ? ?Height: '5\' 4"'$  (162.6 cm) ?Weight: 95.3 kg (210 lb) ?IBW/kg (Calculated) : 54.7 ? ?Temp (24hrs), Avg:100.3 ?F (37.9 ?C), Min:98.6 ?F (37 ?C), Max:101.6 ?F (38.7 ?C) ? ?Recent Labs  ?Lab 02/14/22 ?1040 02/14/22 ?1719 02/14/22 ?1820 02/15/22 ?0425 02/16/22 ?2122 02/16/22 ?4825 02/16/22 ?1143 02/16/22 ?1436 02/17/22 ?0430 02/17/22 ?0839  ?WBC 21.3* 16.1*  --  23.3* 39.2*  --   --   --  34.3*  --   ?CREATININE 3.64* 3.30*  --  2.78* 2.09* 2.00* 2.01* 2.13* 2.30*  --   ?LATICACIDVEN  --   --  1.9  --   --   --   --   --   --  1.6  ?  ?Estimated Creatinine Clearance: 30.6 mL/min (A) (by C-G formula based on SCr of 2.3 mg/dL (H)).   ? ?Allergies  ?Allergen  Reactions  ? Erythromycin Anaphylaxis  ? Penicillins Nausea Only  ? Prednisone Other (See Comments)  ?  Lymph node swelling  ? ? ?Antimicrobials this admission: ?5/3 Rocephin >> 5/5 ?5/5 vancomycin >> ?5/6 ampicillin, cefepime, acyclovir >>  ? ?Microbiology results: ?5/4 BCx: NGTD ?5/3 UCx: E. Coli (sensitive to cefepime) ?5/5 MRSA PCR: not detected ?5/6 LP: ordered ? ?Thank you for allowing pharmacy to be a part of this patient?s care. ? ?Royetta Asal, PharmD, BCPS ?Clinical Pharmacist ?[place] ?Please utilize Amion for appropriate phone number to reach the unit pharmacist ([place]) ?02/17/2022 10:39 AM ? ? ?

## 2022-02-17 NOTE — Progress Notes (Signed)
eLink Physician-Brief Progress Note ?Patient Name: Gabrielle Vincent ?DOB: [birth year] ?MRN: [id number] ? ? ?Date of Service ? 02/17/2022  ?HPI/Events of Note ? Hypernatremia - Na+ = 154 --> 153 --> 156 --> 157. NPO and no gastric tube. Unable to give free water PO or per tube.   ?eICU Interventions ? Plan: ?Increase D5W IV infusion to 125 mL/hour.  ?Continue to trend Na+.  ? ? ? ?Intervention Category ?Major Interventions: Electrolyte abnormality - evaluation and management ? ?Theta Leaf Cornelia Copa ?02/17/2022, 6:10 AM ?

## 2022-02-17 NOTE — Progress Notes (Signed)
eLink Physician-Brief Progress Note ?Patient Name: Gabrielle Vincent ?DOB: 1966/10/02 ?MRN: [id number] ? ? ?Date of Service ? 02/17/2022  ?HPI/Events of Note ? Nursing request for hold parameters for Metoprolol IV order.   ?eICU Interventions ? Plan: ?Metoprolol 5 mg IV Q 6 hours. Hold dose for SBP < 110 or HR < 60.  ? ? ? ?Intervention Category ?Major Interventions: Other: ? ?Ladarrious Kirksey Cornelia Copa ?02/17/2022, 11:47 PM ?

## 2022-02-17 NOTE — Procedures (Addendum)
Patient Name: Gabrielle Vincent  ?MRN: [id number]  ?Epilepsy Attending: Lora Havens  ?Referring Physician/Provider: Greta Doom, MD ?Duration: 02/17/2022 1694 to 2032 ? ?Patient history: 56yo F with ams. EEG to evaluate for seizure ? ?Level of alertness: lethargic  ? ?AEDs during EEG study: Propofol ? ?Technical aspects: This EEG was obtained using a 10 lead EEG system positioned circumferentially without any parasagittal coverage (rapid EEG). Computer selected EEG is reviewed as  well as background features and all clinically significant events. ? ?Description: EEG showed continuous generalized predominantly 5-8 Hz theta-alpha activity admixed with 2-'3Hz'$  delta slowing.  Hyperventilation and photic stimulation were not performed.    ? ?ABNORMALITY ?- Continuous slow, generalized ? ?IMPRESSION: ?This limited ceribell EEG is suggestive of moderate diffuse encephalopathy, nonspecific etiology. No seizures or epileptiform discharges were seen throughout the recording. ? ?If suspicion for ictal-interictal activity remains a concern, a conventional EEG can be considered.  ? ?Lora Havens  ? ?

## 2022-02-17 NOTE — Progress Notes (Signed)
PT Cancellation Note ? ?Patient Details ?Name: Gabrielle Vincent ?MRN: [id number] ?DOB: [birth year] ? ? ?Cancelled Treatment:    Reason Eval/Treat Not Completed: Medical issues which prohibited therapy. Patient intubated. Will check back 5/8. ?Tresa Endo PT ?Acute Rehabilitation Services ?Pager [phone] ?Office [phone] ? ? ? ?Claretha Cooper ?02/17/2022, 7:40 AM ?

## 2022-02-17 NOTE — Progress Notes (Signed)
eLink Physician-Brief Progress Note ?Patient Name: Gabrielle Vincent ?DOB: 1966-01-18 ?MRN: [id number] ? ? ?Date of Service ? 02/17/2022  ?HPI/Events of Note ? Hypernatremia - Na+ = 154 --> 153 --> 156.  ?eICU Interventions ? Plan: ?Increase D5W to 100 mL/hour. ?Continue to trend Na+.  ? ? ? ?Intervention Category ?Major Interventions: Electrolyte abnormality - evaluation and management ? ?[name] ?02/17/2022, 3:40 AM ?

## 2022-02-18 ENCOUNTER — Inpatient Hospital Stay (HOSPITAL_COMMUNITY): Payer: BC Managed Care – PPO

## 2022-02-18 DIAGNOSIS — A419 Sepsis, unspecified organism: Secondary | ICD-10-CM | POA: Diagnosis not present

## 2022-02-18 DIAGNOSIS — G9340 Encephalopathy, unspecified: Secondary | ICD-10-CM | POA: Diagnosis not present

## 2022-02-18 DIAGNOSIS — N390 Urinary tract infection, site not specified: Secondary | ICD-10-CM | POA: Diagnosis not present

## 2022-02-18 LAB — COMPREHENSIVE METABOLIC PANEL
ALT: 47 U/L — ABNORMAL HIGH (ref 0–44)
ALT: 42 U/L (ref 0–44)
AST: 29 U/L (ref 15–41)
AST: 26 U/L (ref 15–41)
Albumin: 1.7 g/dL — ABNORMAL LOW (ref 3.5–5.0)
Albumin: 1.8 g/dL — ABNORMAL LOW (ref 3.5–5.0)
Alkaline Phosphatase: 156 U/L — ABNORMAL HIGH (ref 38–126)
Alkaline Phosphatase: 147 U/L — ABNORMAL HIGH (ref 38–126)
Anion gap: 7 (ref 5–15)
Anion gap: 6 (ref 5–15)
BUN: 49 mg/dL — ABNORMAL HIGH (ref 6–20)
BUN: 45 mg/dL — ABNORMAL HIGH (ref 6–20)
CO2: 19 mmol/L — ABNORMAL LOW (ref 22–32)
CO2: 18 mmol/L — ABNORMAL LOW (ref 22–32)
Calcium: 7.6 mg/dL — ABNORMAL LOW (ref 8.9–10.3)
Calcium: 7.5 mg/dL — ABNORMAL LOW (ref 8.9–10.3)
Chloride: 121 mmol/L — ABNORMAL HIGH (ref 98–111)
Chloride: 126 mmol/L — ABNORMAL HIGH (ref 98–111)
Creatinine, Ser: 2.24 mg/dL — ABNORMAL HIGH (ref 0.44–1.00)
Creatinine, Ser: 2.05 mg/dL — ABNORMAL HIGH (ref 0.44–1.00)
GFR, Estimated: 25 mL/min — ABNORMAL LOW (ref >60)
GFR, Estimated: 28 mL/min — ABNORMAL LOW (ref >60)
Glucose, Bld: 243 mg/dL — ABNORMAL HIGH (ref 70–99)
Glucose, Bld: 172 mg/dL — ABNORMAL HIGH (ref 70–99)
Potassium: 3 mmol/L — ABNORMAL LOW (ref 3.5–5.1)
Potassium: 3.7 mmol/L (ref 3.5–5.1)
Sodium: 147 mmol/L — ABNORMAL HIGH (ref 135–145)
Sodium: 150 mmol/L — ABNORMAL HIGH (ref 135–145)
Total Bilirubin: 1 mg/dL (ref 0.3–1.2)
Total Bilirubin: 0.9 mg/dL (ref 0.3–1.2)
Total Protein: 5.5 g/dL — ABNORMAL LOW (ref 6.5–8.1)
Total Protein: 5.9 g/dL — ABNORMAL LOW (ref 6.5–8.1)

## 2022-02-18 LAB — GLUCOSE, CAPILLARY
Glucose-Capillary: 126 mg/dL — ABNORMAL HIGH (ref 70–99)
Glucose-Capillary: 184 mg/dL — ABNORMAL HIGH (ref 70–99)
Glucose-Capillary: 191 mg/dL — ABNORMAL HIGH (ref 70–99)
Glucose-Capillary: 210 mg/dL — ABNORMAL HIGH (ref 70–99)
Glucose-Capillary: 220 mg/dL — ABNORMAL HIGH (ref 70–99)
Glucose-Capillary: 235 mg/dL — ABNORMAL HIGH (ref 70–99)

## 2022-02-18 LAB — LACTIC ACID, PLASMA: Lactic Acid, Venous: 1.5 mmol/L (ref 0.5–1.9)

## 2022-02-18 LAB — CBC WITH DIFFERENTIAL/PLATELET
Abs Immature Granulocytes: 1.93 10*3/uL — ABNORMAL HIGH (ref 0.00–0.07)
Basophils Absolute: 0.1 10*3/uL (ref 0.0–0.1)
Basophils Relative: 0 %
Eosinophils Absolute: 0.1 10*3/uL (ref 0.0–0.5)
Eosinophils Relative: 0 %
HCT: 35.2 % — ABNORMAL LOW (ref 36.0–46.0)
Hemoglobin: 11.9 g/dL — ABNORMAL LOW (ref 12.0–15.0)
Immature Granulocytes: 8 %
Lymphocytes Relative: 11 %
Lymphs Abs: 2.7 10*3/uL (ref 0.7–4.0)
MCH: 30.1 pg (ref 26.0–34.0)
MCHC: 33.8 g/dL (ref 30.0–36.0)
MCV: 88.9 fL (ref 80.0–100.0)
Monocytes Absolute: 1.1 10*3/uL — ABNORMAL HIGH (ref 0.1–1.0)
Monocytes Relative: 4 %
Neutro Abs: 18.6 10*3/uL — ABNORMAL HIGH (ref 1.7–7.7)
Neutrophils Relative %: 77 %
Platelets: 96 10*3/uL — ABNORMAL LOW (ref 150–400)
RBC: 3.96 MIL/uL (ref 3.87–5.11)
RDW: 18.3 % — ABNORMAL HIGH (ref 11.5–15.5)
WBC: 24.5 10*3/uL — ABNORMAL HIGH (ref 4.0–10.5)
nRBC: 0 % (ref 0.0–0.2)

## 2022-02-18 LAB — CSF CELL COUNT WITH DIFFERENTIAL
RBC Count, CSF: 18480 /mm3 — ABNORMAL HIGH
Tube #: 4
WBC, CSF: 55440 /mm3 (ref 0–5)

## 2022-02-18 LAB — PROTEIN, CSF: Total  Protein, CSF: >600 mg/dL — ABNORMAL HIGH (ref 15–45)

## 2022-02-18 LAB — SODIUM
Sodium: 151 mmol/L — ABNORMAL HIGH (ref 135–145)
Sodium: 150 mmol/L — ABNORMAL HIGH (ref 135–145)

## 2022-02-18 LAB — GLUCOSE, CSF: Glucose, CSF: <20 mg/dL — CL (ref 40–70)

## 2022-02-18 IMAGING — DX DG CHEST 1V PORT
1 series · 1 of 1 positions shown · non-contrast
Comparison: Portable chest yesterday at [DATE] a.m.

CLINICAL DATA: Follow-up for atelectasis.

EXAM:
PORTABLE CHEST 1 VIEW

[chest ap]
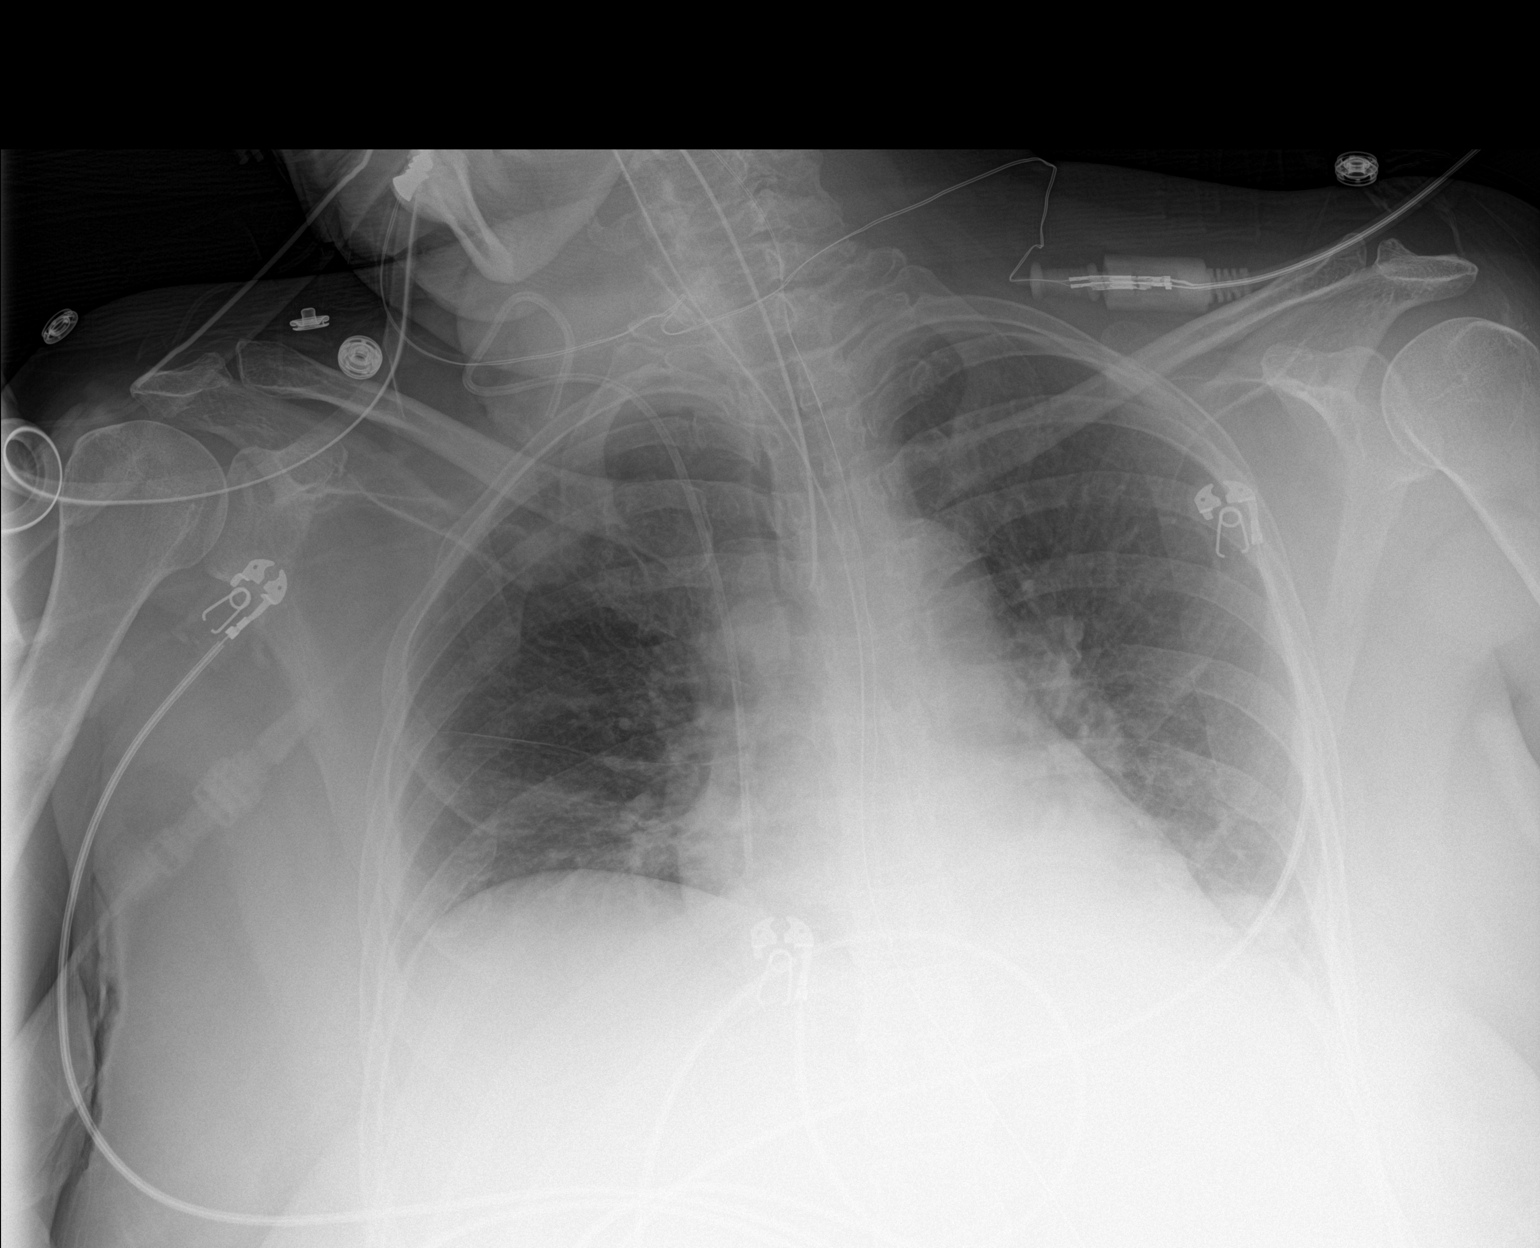

[1 of 1 positions shown; findings below may reference images not displayed]

FINDINGS: [DATE] a.m., [DATE]. ETT tip is 2.0 cm from the carina, NGT well
inside the stomach but the side hole and tip are not included in the
film. Right IJ central line tip remains in the upper right atrium.

Cardiac size is normal. There are low lung volumes. There is
increased opacity in the retrocardiac left lower lobe which could be
atelectasis or consolidation.

Perihilar linear atelectatic bands appear similar. Rest of the
hypoinflated lungs are generally clear.
IMPRESSION: Increased opacity in the left lower lobe, which could be atelectasis
or consolidation. In all other respects no further changes.

## 2022-02-18 IMAGING — DX DG ABD PORTABLE 1V
2 series · 2 of 2 positions shown · non-contrast
Comparison: Radiograph [DATE], CT [DATE]

CLINICAL DATA: Bowel obstruction.

EXAM:
PORTABLE ABDOMEN - 1 VIEW

[abdomen kub (1 of 2)]
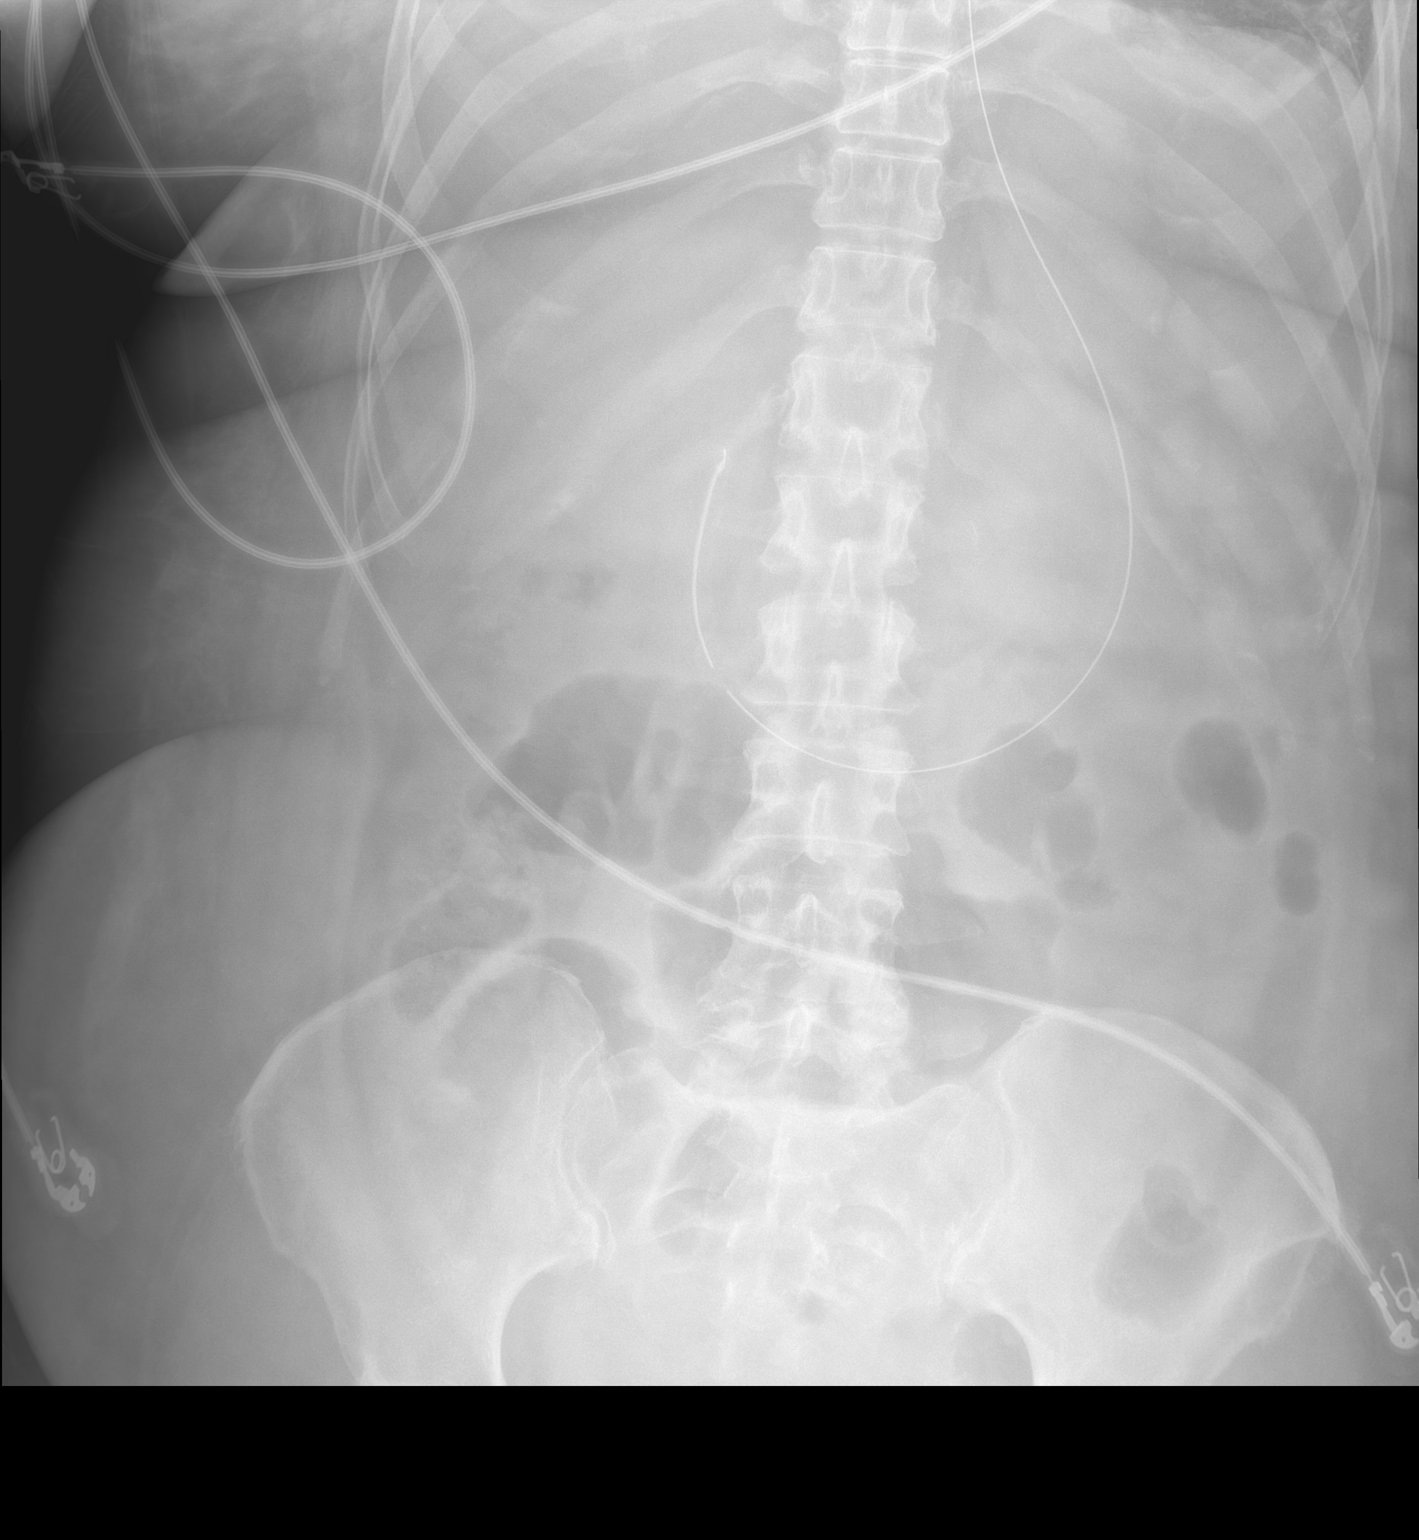

[abdomen kub (2 of 2)]
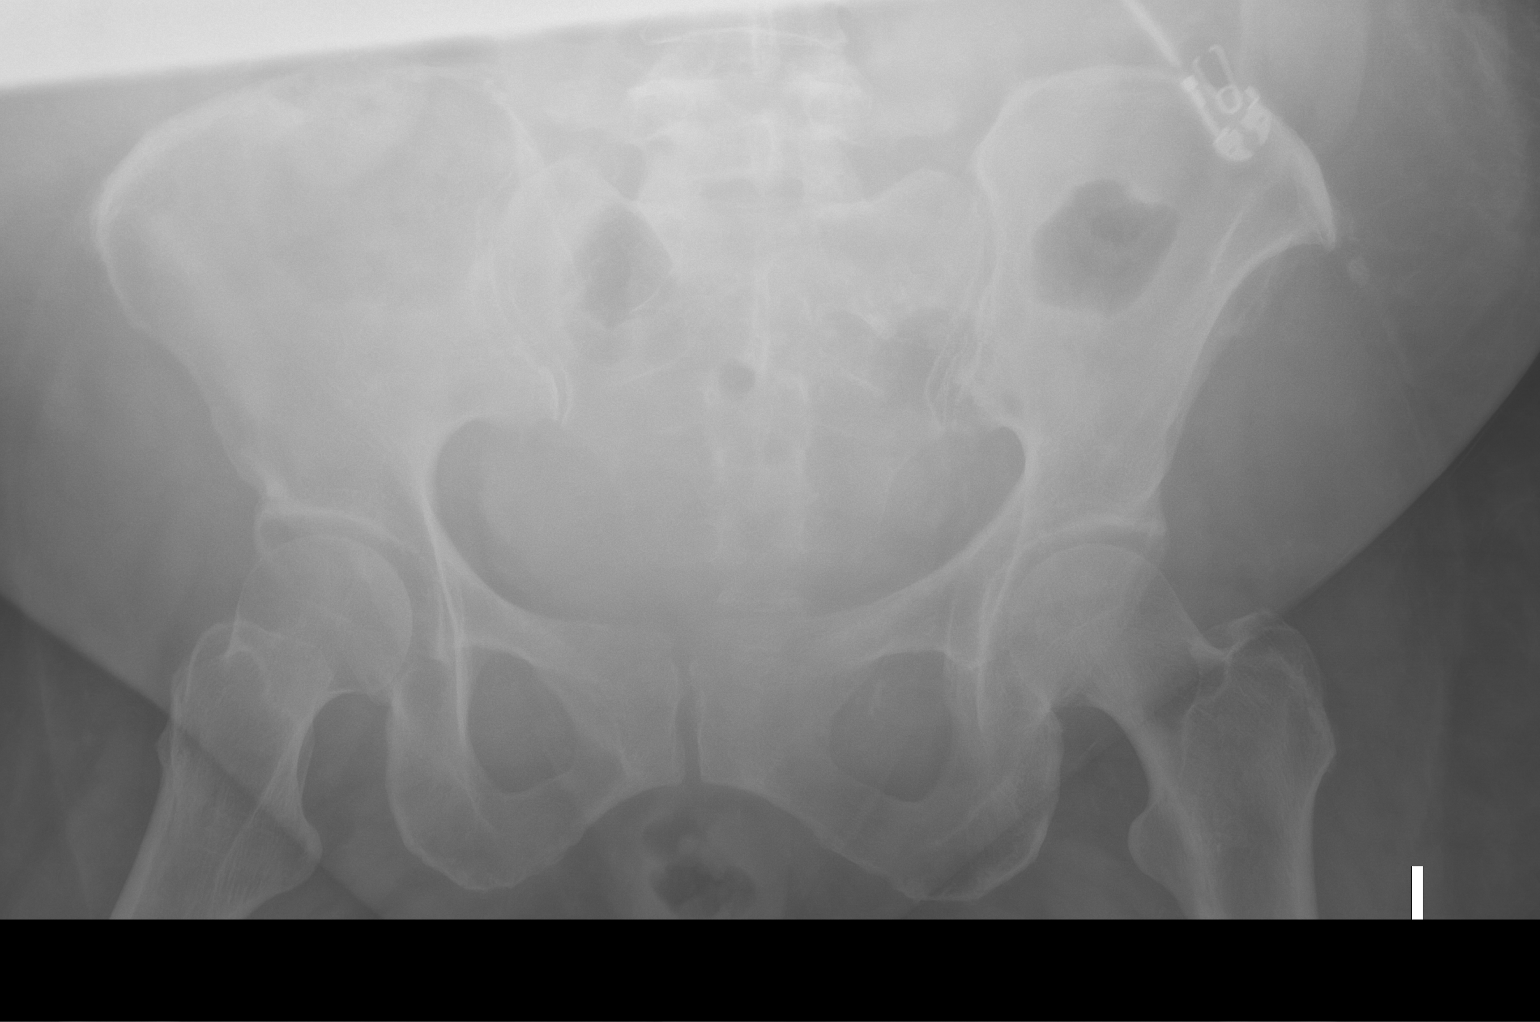

[2 of 2 positions shown; findings below may reference images not displayed]

FINDINGS: Tip and side port of the enteric tube below the diaphragm in the
stomach. There is slight gaseous distension of transverse colon. No
gaseous small bowel distension. Minimal formed stool is seen in the
ascending colon. No visible radiopaque calculi.
IMPRESSION: Nonobstructive bowel gas pattern. Slight gaseous distension of
transverse colon.

## 2022-02-18 IMAGING — RF DG SPINAL PUNCT LUMBAR DIAG WITH FL CT GUIDANCE
2 series · 3 of 3 positions shown · non-contrast
Comparison: None Available.

INDICATION: Request for fluoro guided lumbar puncture due to progressive
encephalopathy, leukocytosis and concern for meningitis.

EXAM:
FLUOROSCOPIC GUIDED LUMBAR PUNCTURE
TECHNIQUE: Informed written consent was obtained from the patient's family
after a discussion of the risks, benefits and alternatives to
treatment. Questions regarding the procedure were encouraged and
answered. A timeout was performed prior to the initiation of the
procedure. Due to patient being intubated on mechanical ventilation,
critical care RN and respiratory therapist were at bedside for the
procedure.

[Series 1: fluoro_iodine_singleshot_bw · 1 of 1 slices shown]
[im 1/1]
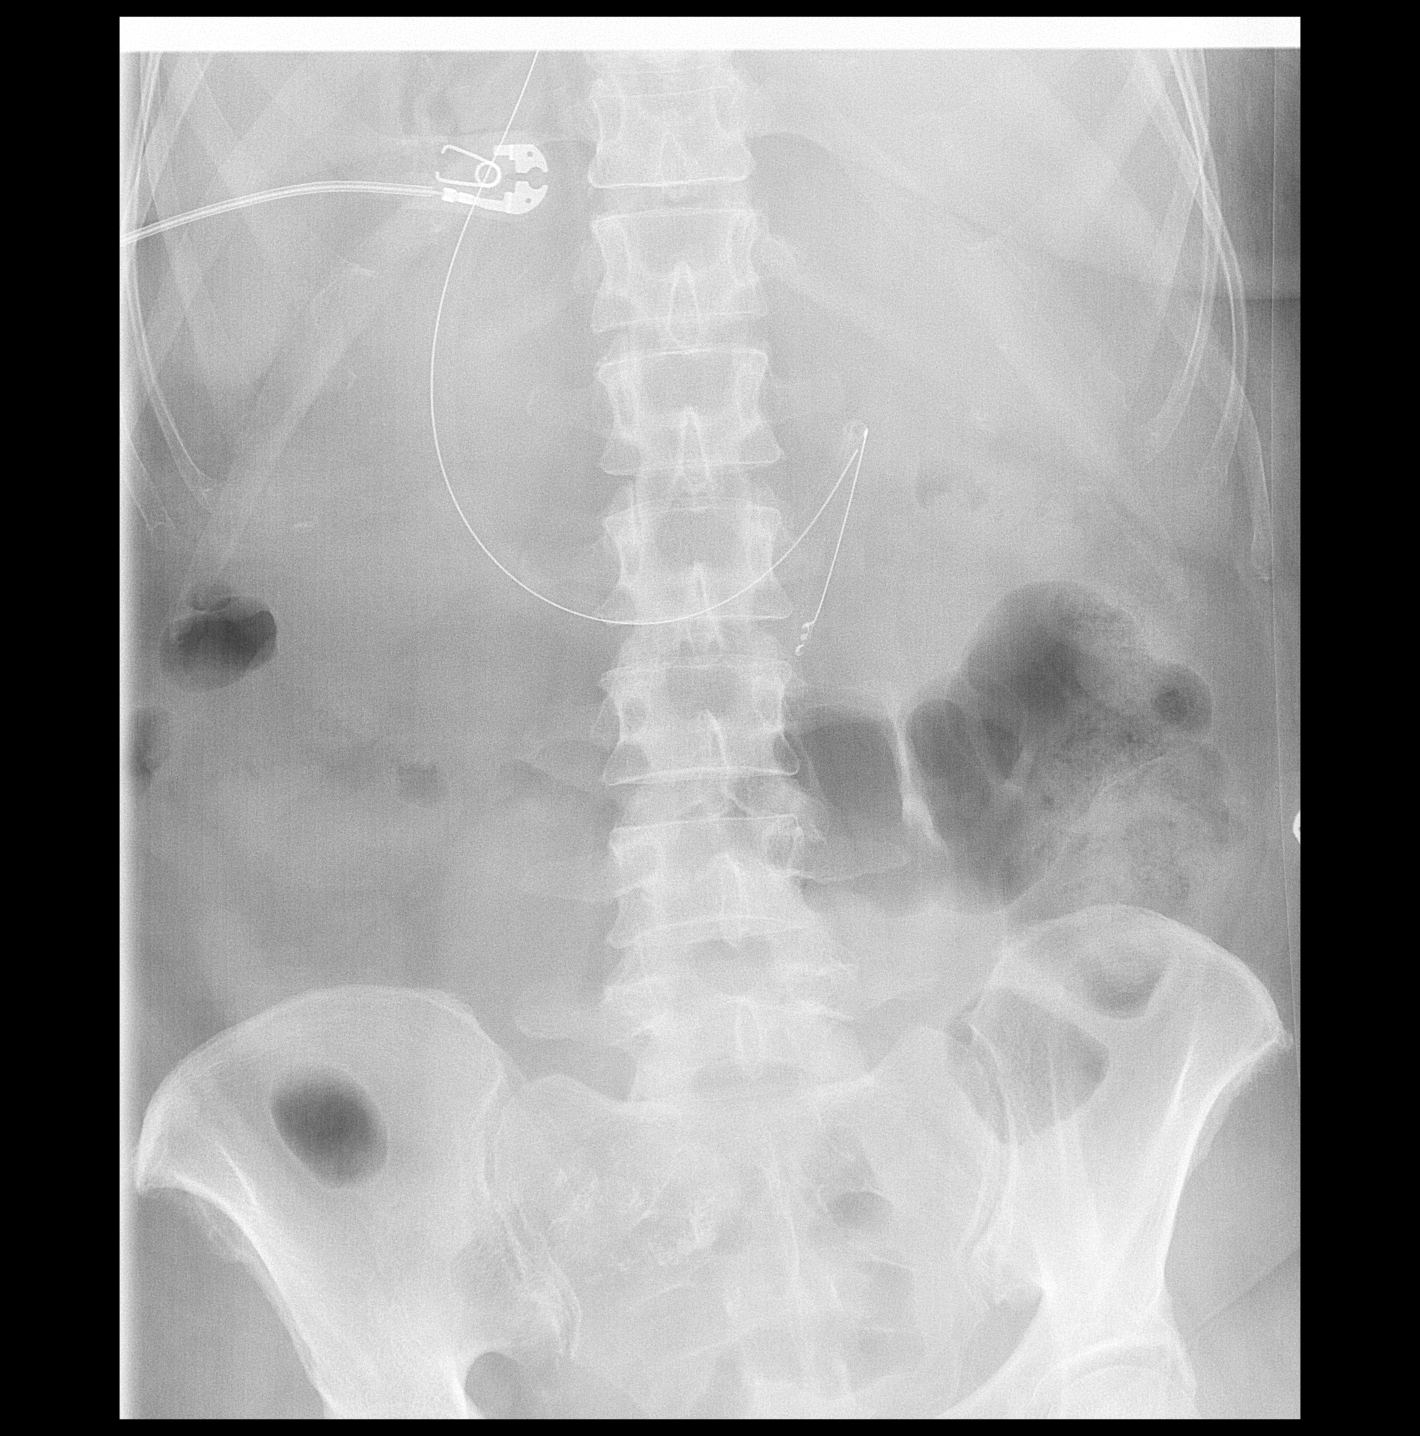

[Series 2: cp_standard · 2 of 2 frames shown]
[frame 1/2]
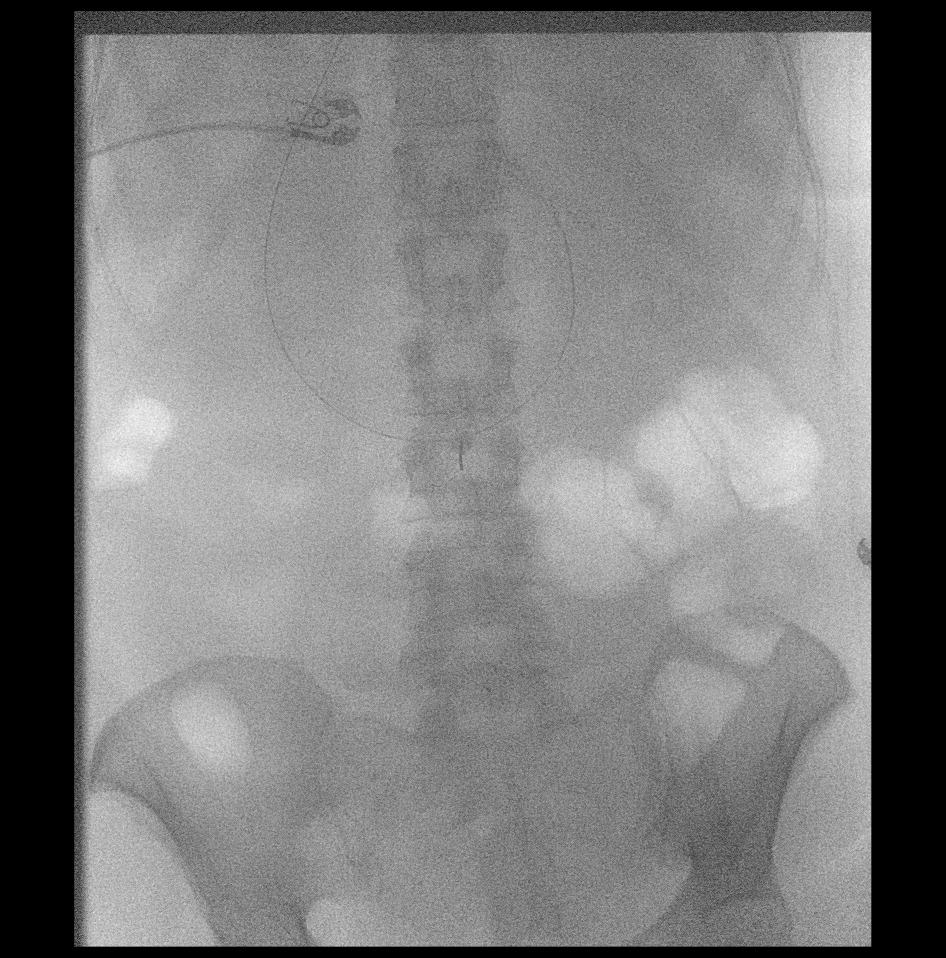
[frame 2/2]
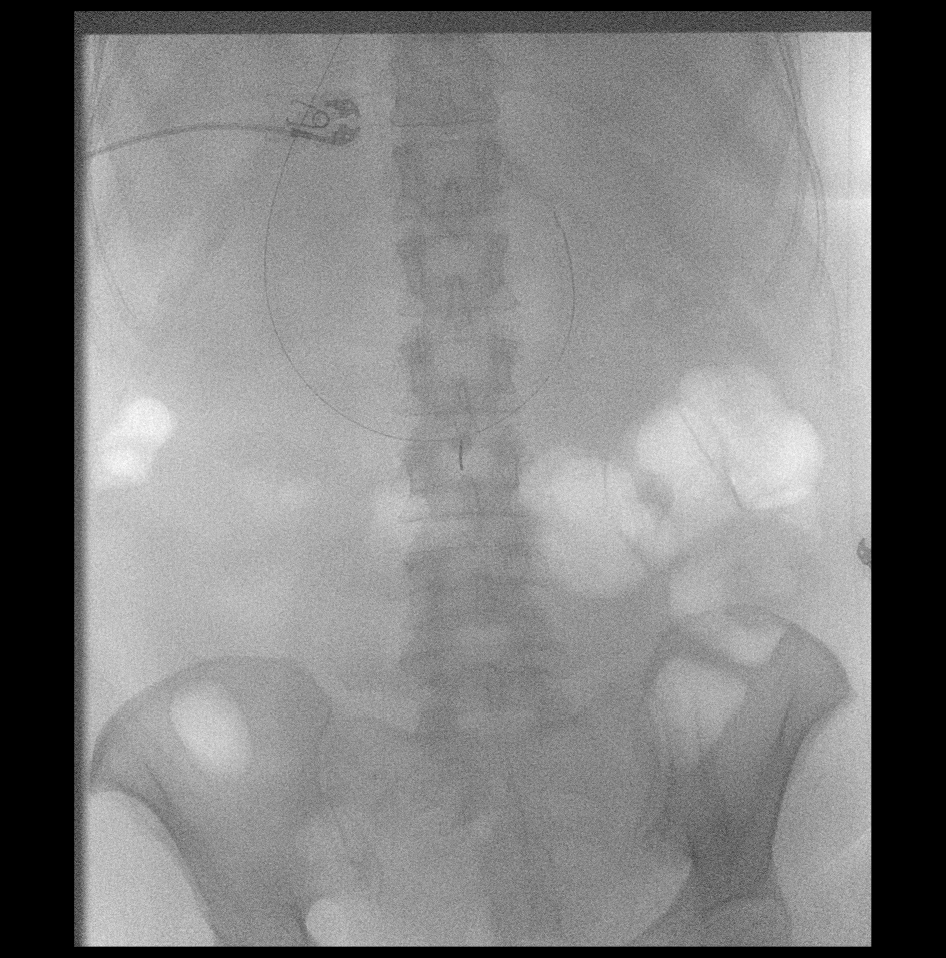

[3 of 3 positions shown; findings below may reference images not displayed]

MEDICATIONS:
6 mL 1 % lidocaine

CONTRAST:  None.

FLUOROSCOPY TIME:  4.10 mGy

COMPLICATIONS:
None immediate
The patient was placed prone on the fluoroscopy table. Level of
L2-L3 was marked fluoroscopically. The skin overlying the operative
site was prepped and draped in the usual sterile fashion. Local
anesthesia was provided with 1% lidocaine.

Under intermittent fluoroscopic guidance, a 20 gauge spinal needle
was advanced into the spinal canal. Appropriate positioning was
confirmed with the efflux of purulent, [REDACTED] colored CSF.
Approximately 8 ml of cerebrospinal fluid was collected into four
separate containers.

Samples were sent to the Laboratory as requested per the clinical
team.

The needle was removed and hemostasis was achieved with manual
compression. A dressing was placed. The patient tolerated the above
procedure well without immediate postprocedural complication.
FINDINGS: Fluoroscopic imaging demonstrates appropriate positioning of the
spinal needle within the spinal canal. Successful fluoroscopic
lumbar puncture yielding approximately 8 ml of clear cerebrospinal
fluid.
IMPRESSION: Technically successful fluoroscopic guided lumbar puncture. Samples
were sent to the Laboratory as requested per the clinical team.

## 2022-02-18 MED ORDER — POTASSIUM CHLORIDE 10 MEQ/50ML IV SOLN
10.0000 meq | INTRAVENOUS | Status: AC
  Administered 2022-02-18 (×4): 10 meq via INTRAVENOUS
  Filled 2022-02-18 (×4): qty 50

## 2022-02-18 MED ORDER — POTASSIUM CHLORIDE 20 MEQ PO PACK: 40.0000 meq | PACK | Freq: Once | ORAL | Status: DC

## 2022-02-18 MED ORDER — LIDOCAINE HCL (PF) 1 % IJ SOLN
INTRAMUSCULAR | Status: AC
  Filled 2022-02-18: qty 5

## 2022-02-18 MED ORDER — CHLORHEXIDINE GLUCONATE 0.12 % MT SOLN
OROMUCOSAL | Status: AC
  Administered 2022-02-18: 15 mL via OROMUCOSAL
  Filled 2022-02-18: qty 15

## 2022-02-18 NOTE — Progress Notes (Signed)
Critical labs  ?Na+ 150 ?CSF ? Glucose <20, at 7.  ? WBC 55,440 ? Gram stain (+) for gpc abundant wbcs ?Dr. Vaughan Browner notified. Awaiting orders  ?

## 2022-02-18 NOTE — Progress Notes (Signed)
Patient transported to CT and back on ventilator with no complications. Vitals stable.  ?

## 2022-02-18 NOTE — Progress Notes (Addendum)
? ?NAME:  Gabrielle Vincent, MRN:  [id number], DOB:  01/13/1966, LOS: 4 ?ADMISSION DATE:  02/14/2022, CONSULTATION DATE:  5/5 ?REFERRING MD:  Rai, CHIEF COMPLAINT:  acute encephalopathy and sepsis   ? ?History of Present Illness:  ?56 year old female who was admitted on 5/3 with chief complaint of severe low back pain radiating down her left leg.  Also had associated hematuria and dysuria.  She had no weakness although low did say pain impacted her ability to walk.  Also noted poor p.o. intake, decreased urination as well as urinary retention, hematuria, dysuria, and some nausea.  ? ?CT scan was negative for obstructive uropathy did show fatty liver infiltrates, there is cholelithiasis but no acute cholecystitis there is mild circumferential thickening of the distal esophagus global enlargement of the uterus with thickened appearance of the endometrial stripe with radiology recommending nonemergent pelvic ultrasound.  Diagnostic evaluation notable for new AKI with serum creatinine 3.64, acute transaminitis leukocytosis, and hyponatremia of 127 she was admitted for further evaluation, cultures were sent, and she was started on IV ceftriaxone. ? ?On 5/4 patient was noted to be more confused, speech was slurred , Working diagnosis was #1 urinary tract infection with resultant sepsis and also low back pain for which neurosurgery was consulted as MRI finding showed acute herniated L3-L4 disc with left lumbar radiculopathy.  It was felt that pain control via lumbar injection may help in that this could be treated conservatively. ?On 5/4 patient becoming intermittently agitated then lethargic, because of this a CT of head was obtained this was severely limited due to motion degraded meant but there was concern about hypodense area in the right basal ganglia section raising concern for acute infarct.  On 5/5 a rapid response was called the patient was severely agitated, pulled out IVs, pull out Foley catheter ?Later that  afternoon mental status continued to worsen.  Neurology consult was obtained in addition to this infectious disease was consulted with urine growing E. coli and narrowed antibiotics to cefazolin stopped vancomycin and recommended supportive care because her mental status continued to decline, she had severe metabolic derangements, and we were unable to provide medical care on the [facility] she was transferred to the ICU for higher level of care.  On initial arrival she was agitated requiring several nurses to hold her down, tachypneic tachycardic would not follow commands had marked increased work of breathing.  An intraosseous line was placed in the right lower extremity, she was intubated for airway protection to facilitate further MRI imaging, and a right IJ triple-lumen catheter was placed ? ?Pertinent  Medical History  ? ?Class II obesity, hepatic steatosis, seasonal allergies, varicose veins diverticulosis, HL  ? ?Significant Hospital Events: ?Including procedures, antibiotic start and stop dates in addition to other pertinent events   ?5/3 admitted with back pain and urinary tract infection , Further complicated by acute kidney injury hyponatremia and multiple metabolic derangements.  CT imaging for renal stones showed no acute abdominal pelvic findings there was no obstructive uropathy there was severe fatty liver disease there was cholelithiasis but no cholecystitis there is colonic diverticulosis but no diverticulitis, mild circumferential thickening of the esophagus globular enlargement of the uterus with radiology recommending nonemergent pelvic ultrasound found to have uterine fibroid abdominal Ultrasound showed fatty liver but was epididymides negative.  An MRI of the lumbar spine showed small left subarticular disc extrusion with inferior migration at L3-L4 correlating with radiculopathy.  She was started on ceftriaxone cultures were sent ?5/4 increased confusion CT  brain obtained raising concern  for possible acute infarct in the right basal ganglia ?5/5 progressive encephalopathy , Worsening leukocytosis, hypernatremia, hyperchloremia, slowly improving renal function, slowly improving procalcitonin, moved to ICU due to delirium, intubated for airway protection and to facilitate MRI imaging right IJ central line placed due to limited IV access.  Seen by neurology, also seen by infectious disease with ceftriaxone changed to cefazolin ?5/6 antibiotics changed to meningitis coverage given MRI findings of fluid in the ventricles.  Image guided LP ordered ? ?Interim History / Subjective:  ? ?Off pressors.  Remains on the ventilator.  No acute events ? ?Objective   ?Blood pressure (!) 105/50, pulse 78, temperature (!) 96.4 ?F (35.8 ?C), temperature source Esophageal, resp. rate (!) 24, height '5\' 4"'$  (1.626 m), weight 98.5 kg, SpO2 99 %. ?CVP:  [6 mmHg-15 mmHg] 11 mmHg  ?Vent Mode: PRVC ?FiO2 (%):  [40 %] 40 % ?Set Rate:  [23 bmp-24 bmp] 24 bmp ?Vt Set:  [440 mL] 440 mL ?PEEP:  [5 cmH20] 5 cmH20 ?Plateau Pressure:  [14 cmH20-19 cmH20] 16 cmH20  ? ?Intake/Output Summary (Last 24 hours) at 02/18/2022 0919 ?Last data filed at 02/18/2022 0900 ?Gross per 24 hour  ?Intake 5499.77 ml  ?Output 2675 ml  ?Net 2824.77 ml  ? ?Filed Weights  ? 02/14/22 1001 02/18/22 0500  ?Weight: 95.3 kg 98.5 kg  ? ? ?Examination: ?Blood pressure (!) 105/50, pulse 78, temperature (!) 96.4 ?F (35.8 ?C), temperature source Esophageal, resp. rate (!) 24, height '5\' 4"'$  (1.626 m), weight 98.5 kg, SpO2 99 %. ?Gen:      No acute distress ?HEENT:  EOMI, sclera anicteric ?Neck:     No masses; no thyromegaly, ETT ?Lungs:    Clear to auscultation bilaterally; normal respiratory effort ?CV:         Regular rate and rhythm; no murmurs ?Abd:      + bowel sounds; soft, non-tender; no palpable masses, no distension ?Ext:    No edema; adequate peripheral perfusion ?Skin:      Warm and dry; no rash ?Neuro: Sedated, unresponsive ? ?Labs/imaging reviewed.   ?Sodium  improved to 147, potassium 3.0, BUN/creatinine 49/2.24 ?Transaminases are improving ?WBC count down to 24.5 ?Chest x-ray increased opacity in the left lower lobe ? ?Resolved Hospital Problem list   ? ? ?Assessment & Plan:  ?Principal Problem: ?  Sepsis secondary to UTI [facility]) ?Active Problems: ?  Hyponatremia ?  Hypokalemia ?  AKI (acute kidney injury) ([place]) ?  Abnormal LFTs ?  Class II obesity ?  Hepatic steatosis ?  Cholelithiasis ?  Esophageal thickening ?  Acute low back pain ?  Encephalopathy acute ?  Acute respiratory failure ([place]) ?  Radiculopathy of lumbar region ?  E. coli UTI ? ?Acute metabolic encephalopathy.  Favor secondary to sepsis, complicated further by  multiple metabolic derangements, and pain.  There is no evidence of infarct on MRI but images do raise the concern for meningitis ? ?Plan ?Supportive care ?PAD protocol, RASS goal -2 starting propofol and fentanyl infusions ?EEG with no evidence of seizure ?On antibiotics to cover CNS, plan for LP today.  She will need fluoroscopy for procedure as she has narrowed thecal sac and disc protrusion on MRI lumbar ? ?Severe sepsis secondary to E. coli UTI ?Continue antibiotics coverage and plan for LP as above ? ?Acute low back pain with radiculopathy secondary to acute herniated nucleus pulposus  ?Plan ?Pain management ?Conservative care per n-surg ? ?Acute respiratory failure.  Intubated for  respiratory protection  ?Plan ?Continue vent support ?Follow intermittent chest x-ray ? ?Difficult airway ?Airway was anterior and had significant swelling ?Plan ?Would consider leak test before extubation  ? ?AKI 2/2 sepsis ?Plan ?Fluid hydration.  ?Monitor I's/O ? ?Fluid and electrolyte imbalance: hypernatremia, hyperchloremia, normal ag metabolic acidosis ?-free water def: 4.75 liters  ?Plan ?Continue D5 water.  Follow sodium ? ?Abnormal LFTs superimposed on known fatty liver disease  ?Plan ?Trend  ? ?Hyperglycemia ?Plan ?SSI ? ?Family updated 5/7 ? ?Best Practice  (right click and "Reselect all SmartList Selections" daily)  ? ?Diet/type: tubefeeds ?DVT prophylaxis: prophylactic heparin  ?GI prophylaxis: PPI ?Lines: Central line ?Foley:  Yes, and it is still needed ?Code S

## 2022-02-18 NOTE — Progress Notes (Signed)
Subjective: ?LP was performed earlier which appeared frankly purulent ? ?Exam: ?Vitals:  ? 02/18/22 1645 02/18/22 1700  ?BP:  (!) 94/48  ?Pulse: 84 84  ?Resp: (!) 26 (!) 24  ?Temp: 98.2 ?F (36.8 ?C) 98.2 ?F (36.8 ?C)  ?SpO2: 97% 98%  ? ?Gen: In bed, on ventilator.  ? ?Neuro: ?MS: Does not open eyes or follow commands ?CN: Pupils are reactive bilaterally, corneas intact ?Motor: flexion bilateral lower extremities, withdrawal in bilateral upper extremities ?Sensory:as above ? ?Pertinent Labs: ?CSF WBC 55,000 ?CSF Gram stain gram-positive cocci ?CSF protein greater than 600 ?CSF glucose less than 20 ? ? ?MRI brain-no stroke, but there is some concern for ventricular debris ? ?Impression: 56 year old female with UTI and what was felt to be gram-negative sepsis and now shown to be gram-positive meningitis.  There is been no sign of seizure on EEG.  She was likely partially treated with her ceftriaxone prior to initiation of broad-spectrum yesterday, though culture/sensitivities are pending. ? ?Given the number of cells, I do have concerns about her prognosis, but we are much too early to predict this.  Improvement may be slow over the next few weeks. ? ?Recommendations: ?1) continue antibiotic therapy for meningitis ? ? ?Roland Rack, MD ?[facility] ?[phone] ? ?If 7pm- 7am, please page neurology on call as listed in [place]. ? ?

## 2022-02-18 NOTE — Procedures (Signed)
Technically successful fluoro guided LP at L2/L3 level. ?8 cc of purulent, orange-colored CSF sent to lab for analysis. ? ?No immediate post procedural complication. ? ?Please see imaging section of Epic for full dictation. ? ? ? ?Narda Rutherford, AGNP-BC ?02/18/2022, 2:01 PM ? ? ?

## 2022-02-19 ENCOUNTER — Inpatient Hospital Stay (HOSPITAL_COMMUNITY)
Admit: 2022-02-19 | Discharge: 2022-02-19 | Disposition: A | Discharge status: Home / Self Care | Payer: BC Managed Care – PPO | Attending: Neurology | Admitting: Neurology

## 2022-02-19 ENCOUNTER — Inpatient Hospital Stay (HOSPITAL_COMMUNITY): Payer: BC Managed Care – PPO

## 2022-02-19 DIAGNOSIS — G039 Meningitis, unspecified: Secondary | ICD-10-CM

## 2022-02-19 DIAGNOSIS — A419 Sepsis, unspecified organism: Secondary | ICD-10-CM | POA: Diagnosis not present

## 2022-02-19 DIAGNOSIS — G9340 Encephalopathy, unspecified: Secondary | ICD-10-CM | POA: Diagnosis not present

## 2022-02-19 DIAGNOSIS — N390 Urinary tract infection, site not specified: Secondary | ICD-10-CM | POA: Diagnosis not present

## 2022-02-19 LAB — CBC
HCT: 35.7 % — ABNORMAL LOW (ref 36.0–46.0)
Hemoglobin: 11.8 g/dL — ABNORMAL LOW (ref 12.0–15.0)
MCH: 29.6 pg (ref 26.0–34.0)
MCHC: 33.1 g/dL (ref 30.0–36.0)
MCV: 89.5 fL (ref 80.0–100.0)
Platelets: 106 10*3/uL — ABNORMAL LOW (ref 150–400)
RBC: 3.99 MIL/uL (ref 3.87–5.11)
RDW: 18.4 % — ABNORMAL HIGH (ref 11.5–15.5)
WBC: 20.2 10*3/uL — ABNORMAL HIGH (ref 4.0–10.5)
nRBC: 0.1 % (ref 0.0–0.2)

## 2022-02-19 LAB — COMPREHENSIVE METABOLIC PANEL
ALT: 35 U/L (ref 0–44)
AST: 21 U/L (ref 15–41)
Albumin: 1.7 g/dL — ABNORMAL LOW (ref 3.5–5.0)
Alkaline Phosphatase: 138 U/L — ABNORMAL HIGH (ref 38–126)
Anion gap: 7 (ref 5–15)
BUN: 38 mg/dL — ABNORMAL HIGH (ref 6–20)
CO2: 18 mmol/L — ABNORMAL LOW (ref 22–32)
Calcium: 7.6 mg/dL — ABNORMAL LOW (ref 8.9–10.3)
Chloride: 127 mmol/L — ABNORMAL HIGH (ref 98–111)
Creatinine, Ser: 1.9 mg/dL — ABNORMAL HIGH (ref 0.44–1.00)
GFR, Estimated: 31 mL/min — ABNORMAL LOW (ref >60)
Glucose, Bld: 222 mg/dL — ABNORMAL HIGH (ref 70–99)
Potassium: 3.1 mmol/L — ABNORMAL LOW (ref 3.5–5.1)
Sodium: 152 mmol/L — ABNORMAL HIGH (ref 135–145)
Total Bilirubin: 1 mg/dL (ref 0.3–1.2)
Total Protein: 5.7 g/dL — ABNORMAL LOW (ref 6.5–8.1)

## 2022-02-19 LAB — GLUCOSE, CAPILLARY
Glucose-Capillary: 179 mg/dL — ABNORMAL HIGH (ref 70–99)
Glucose-Capillary: 193 mg/dL — ABNORMAL HIGH (ref 70–99)
Glucose-Capillary: 215 mg/dL — ABNORMAL HIGH (ref 70–99)
Glucose-Capillary: 226 mg/dL — ABNORMAL HIGH (ref 70–99)
Glucose-Capillary: 227 mg/dL — ABNORMAL HIGH (ref 70–99)
Glucose-Capillary: 229 mg/dL — ABNORMAL HIGH (ref 70–99)

## 2022-02-19 LAB — PHOSPHORUS: Phosphorus: 3.3 mg/dL (ref 2.5–4.6)

## 2022-02-19 LAB — MAGNESIUM
Magnesium: 2.3 mg/dL (ref 1.7–2.4)
Magnesium: 2 mg/dL (ref 1.7–2.4)

## 2022-02-19 LAB — SODIUM: Sodium: 153 mmol/L — ABNORMAL HIGH (ref 135–145)

## 2022-02-19 LAB — HSV 1/2 PCR, CSF
HSV-1 DNA: NEGATIVE
HSV-2 DNA: NEGATIVE

## 2022-02-19 IMAGING — DX DG ABDOMEN 1V
1 series · 1 of 1 positions shown · non-contrast
Comparison: [DATE]

CLINICAL DATA: Orogastric tube placement

EXAM:
ABDOMEN - 1 VIEW

[abdomen kub]
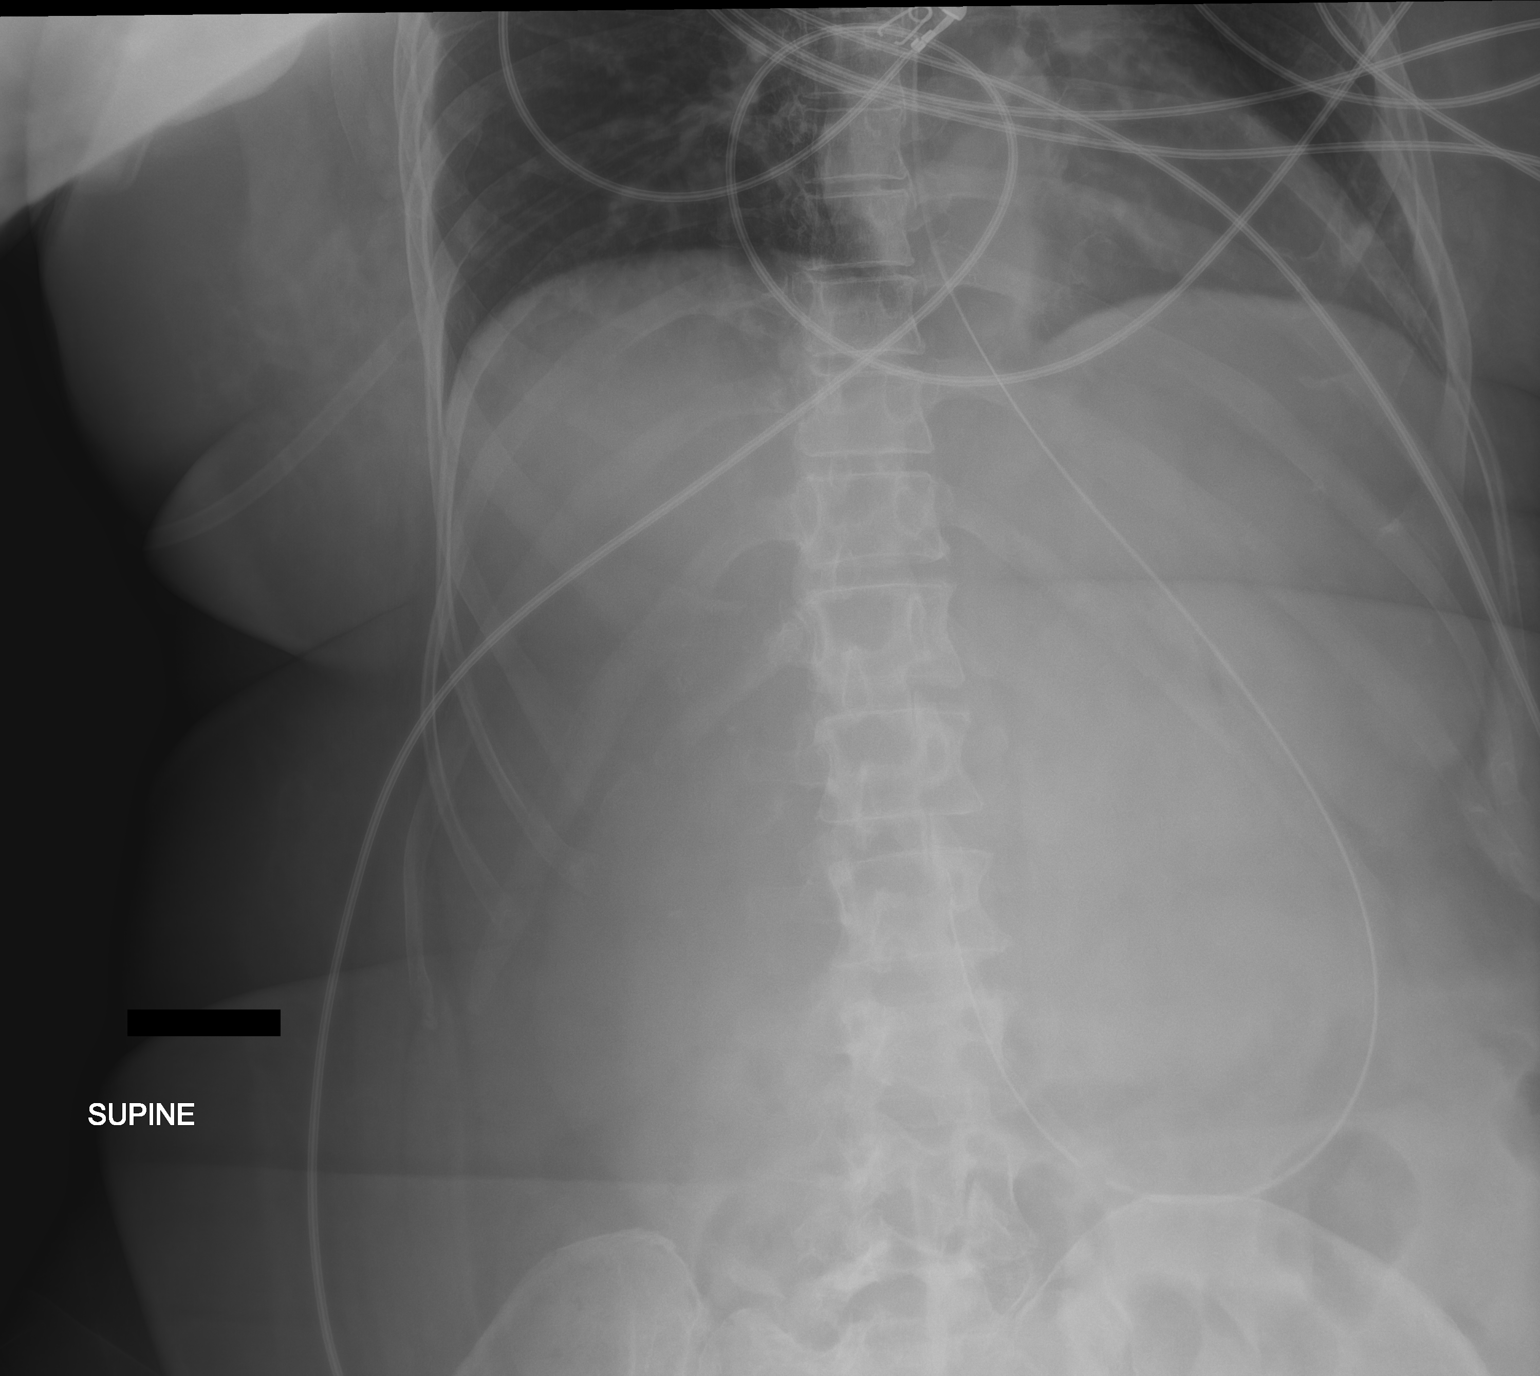

[1 of 1 positions shown; findings below may reference images not displayed]

FINDINGS: Limited radiograph of the lower chest and upper abdomen was obtained
for the purposes of enteric tube localization. Enteric tube is seen
coursing below the diaphragm with distal tip and side port
terminating within the expected location of the gastric body.
IMPRESSION: Enteric tube tip and side port positioned within the gastric body.

## 2022-02-19 MED ORDER — POTASSIUM CHLORIDE 20 MEQ PO PACK
40.0000 meq | PACK | Freq: Once | ORAL | Status: AC
  Administered 2022-02-19: 40 meq
  Filled 2022-02-19: qty 2

## 2022-02-19 MED ORDER — POTASSIUM CHLORIDE 10 MEQ/100ML IV SOLN
10.0000 meq | INTRAVENOUS | Status: AC
  Administered 2022-02-19 (×4): 10 meq via INTRAVENOUS
  Filled 2022-02-19 (×4): qty 100

## 2022-02-19 MED ORDER — ENOXAPARIN SODIUM 40 MG/0.4ML IJ SOSY
40.0000 mg | PREFILLED_SYRINGE | Freq: Every day | INTRAMUSCULAR | Status: DC
Start: 2022-02-19 — End: 2022-02-23
  Administered 2022-02-19 – 2022-02-23 (×5): 40 mg via SUBCUTANEOUS
  Filled 2022-02-19 (×5): qty 0.4

## 2022-02-19 MED ORDER — OSMOLITE 1.2 CAL PO LIQD
1000.0000 mL | ORAL | Status: DC
  Administered 2022-02-19: 1000 mL

## 2022-02-19 MED ORDER — SODIUM CHLORIDE 0.9 % IV SOLN: 2.0000 g | Freq: Two times a day (BID) | INTRAVENOUS | Status: DC

## 2022-02-19 MED ORDER — VITAL HIGH PROTEIN PO LIQD: 1000.0000 mL | ORAL | Status: DC

## 2022-02-19 MED ORDER — SODIUM CHLORIDE 0.9 % IV SOLN
2.0000 g | Freq: Two times a day (BID) | INTRAVENOUS | Status: DC
  Administered 2022-02-19 – 2022-02-20 (×2): 2 g via INTRAVENOUS
  Filled 2022-02-19 (×2): qty 12.5

## 2022-02-19 MED ORDER — PROSOURCE TF PO LIQD
45.0000 mL | Freq: Three times a day (TID) | ORAL | Status: DC
  Administered 2022-02-19 – 2022-02-21 (×6): 45 mL
  Filled 2022-02-19 (×6): qty 45

## 2022-02-19 MED ORDER — FREE WATER
200.0000 mL | Status: DC
  Administered 2022-02-19 – 2022-02-20 (×5): 200 mL

## 2022-02-19 NOTE — Progress Notes (Signed)
Inpatient Diabetes Program Recommendations ? ?AACE/ADA: New Consensus Statement on Inpatient Glycemic Control (2015) ? ?Target Ranges:  Prepandial:   less than 140 mg/dL ?     Peak postprandial:   less than 180 mg/dL (1-2 hours) ?     Critically ill patients:  140 - 180 mg/dL  ? ?Lab Results  ?Component Value Date  ? GLUCAP 215 (H) 02/19/2022  ? HGBA1C 8.9 (H) 02/14/2022  ? ? ?Review of Glycemic Control ? ?Diabetes history: New- onset DM ?Outpatient Diabetes medications: None ?Current orders for Inpatient glycemic control: Novolog 0-15 Q4H ? ?On vent and sedated.  ?CBGs 229, 215 mg/dL ? ?Inpatient Diabetes Program Recommendations:   ? ?ICU Glycemic Control Order Set with Novolog 1-3 Q4H. ? ?Follow daily. ? ?Thank you. ?Lorenda Peck, RD, LDN, CDE ?Inpatient Diabetes Coordinator ?[phone]  ? ? ? ? ?

## 2022-02-19 NOTE — Procedures (Signed)
Patient Name: Gabrielle Vincent  ?MRN: [id number]  ?Epilepsy Attending: Lora Havens  ?Referring Physician/Provider: Marshell Garfinkel, MD ?Date: 02/19/2022  ?Duration: 23.25 mins ? ?Patient history: 56yo female with altered mental status.  EEG to evaluate for seizure. ? ?Level of alertness:  lethargic  ? ?AEDs during EEG study: Propofol ? ?Technical aspects: This EEG study was done with scalp electrodes positioned according to the 10-20 International system of electrode placement. Electrical activity was acquired at a sampling rate of '500Hz'$  and reviewed with a high frequency filter of '70Hz'$  and a low frequency filter of '1Hz'$ . EEG data were recorded continuously and digitally stored.  ? ?Description: EEG showed continuous generalized 3-'5Hz'$  theta-delta slowing.  Hyperventilation and photic stimulation were not performed.    ?  ?ABNORMALITY ?- Continuous slow, generalized ?  ?IMPRESSION: ?This study is suggestive of moderate to severe diffuse encephalopathy, nonspecific etiology. No seizures or epileptiform discharges were seen throughout the recording. ? ?Lora Havens  ?

## 2022-02-19 NOTE — Progress Notes (Signed)
? ?NAME:  Gabrielle Vincent, MRN:  [id number], DOB:  1966/08/25, LOS: 5 ?ADMISSION DATE:  02/14/2022, CONSULTATION DATE:  5/5 ?REFERRING MD:  Rai, CHIEF COMPLAINT:  acute encephalopathy and sepsis   ? ?History of Present Illness:  ?56 year old female who was admitted on 5/3 with chief complaint of severe low back pain radiating down her left leg.  Also had associated hematuria and dysuria.  She had no weakness although low did say pain impacted her ability to walk.  Also noted poor p.o. intake, decreased urination as well as urinary retention, hematuria, dysuria, and some nausea.  ? ?CT scan was negative for obstructive uropathy did show fatty liver infiltrates, there is cholelithiasis but no acute cholecystitis there is mild circumferential thickening of the distal esophagus global enlargement of the uterus with thickened appearance of the endometrial stripe with radiology recommending nonemergent pelvic ultrasound.  Diagnostic evaluation notable for new AKI with serum creatinine 3.64, acute transaminitis leukocytosis, and hyponatremia of 127 she was admitted for further evaluation, cultures were sent, and she was started on IV ceftriaxone. ? ?On 5/4 patient was noted to be more confused, speech was slurred , Working diagnosis was #1 urinary tract infection with resultant sepsis and also low back pain for which neurosurgery was consulted as MRI finding showed acute herniated L3-L4 disc with left lumbar radiculopathy.  It was felt that pain control via lumbar injection may help in that this could be treated conservatively. ?On 5/4 patient becoming intermittently agitated then lethargic, because of this a CT of head was obtained this was severely limited due to motion degraded meant but there was concern about hypodense area in the right basal ganglia section raising concern for acute infarct.  On 5/5 a rapid response was called the patient was severely agitated, pulled out IVs, pull out Foley catheter ?Later that  afternoon mental status continued to worsen.  Neurology consult was obtained in addition to this infectious disease was consulted with urine growing E. coli and narrowed antibiotics to cefazolin stopped vancomycin and recommended supportive care because her mental status continued to decline, she had severe metabolic derangements, and we were unable to provide medical care on the [facility] she was transferred to the ICU for higher level of care.  On initial arrival she was agitated requiring several nurses to hold her down, tachypneic tachycardic would not follow commands had marked increased work of breathing.  An intraosseous line was placed in the right lower extremity, she was intubated for airway protection to facilitate further MRI imaging, and a right IJ triple-lumen catheter was placed ? ?Pertinent  Medical History  ? ?Class II obesity, hepatic steatosis, seasonal allergies, varicose veins diverticulosis, HL  ? ?Significant Hospital Events: ?Including procedures, antibiotic start and stop dates in addition to other pertinent events   ?5/3 admitted with back pain and urinary tract infection , Further complicated by acute kidney injury hyponatremia and multiple metabolic derangements.  CT imaging for renal stones showed no acute abdominal pelvic findings there was no obstructive uropathy there was severe fatty liver disease there was cholelithiasis but no cholecystitis there is colonic diverticulosis but no diverticulitis, mild circumferential thickening of the esophagus globular enlargement of the uterus with radiology recommending nonemergent pelvic ultrasound found to have uterine fibroid abdominal Ultrasound showed fatty liver but was epididymides negative.  An MRI of the lumbar spine showed small left subarticular disc extrusion with inferior migration at L3-L4 correlating with radiculopathy.  She was started on ceftriaxone cultures were sent ?5/4 increased confusion CT  brain obtained raising concern  for possible acute infarct in the right basal ganglia ?5/5 progressive encephalopathy , Worsening leukocytosis, hypernatremia, hyperchloremia, slowly improving renal function, slowly improving procalcitonin, moved to ICU due to delirium, intubated for airway protection and to facilitate MRI imaging right IJ central line placed due to limited IV access.  Seen by neurology, also seen by infectious disease with ceftriaxone changed to cefazolin ?5/6 antibiotics changed to meningitis coverage given MRI findings of fluid in the ventricles.  Image guided LP ordered ?5/8 LP done with purulent CSF, studies consistent with bacterial meningitis, culture growing gm + cocci ? ?Interim History / Subjective:  ? ?Off pressors.  Remains on the ventilator.  No acute events ? ?Objective   ?Blood pressure (!) 115/47, pulse 93, temperature 97.7 ?F (36.5 ?C), resp. rate (!) 26, height '5\' 4"'$  (1.626 m), weight 99.5 kg, SpO2 99 %. ?CVP:  [9 mmHg] 9 mmHg  ?Vent Mode: PRVC ?FiO2 (%):  [40 %] 40 % ?Set Rate:  [24 bmp] 24 bmp ?Vt Set:  [440 mL] 440 mL ?PEEP:  [5 cmH20] 5 cmH20 ?Plateau Pressure:  [12 cmH20-17 cmH20] 14 cmH20  ? ?Intake/Output Summary (Last 24 hours) at 02/19/2022 0856 ?Last data filed at 02/19/2022 4970 ?Gross per 24 hour  ?Intake 4954.84 ml  ?Output 5400 ml  ?Net -445.16 ml  ? ? ?Filed Weights  ? 02/14/22 1001 02/18/22 0500 02/19/22 0500  ?Weight: 95.3 kg 98.5 kg 99.5 kg  ? ? ?General:  critically ill-appearing F, intubated and sedated, restless ?HEENT: MM pink/moist, ETT in place, sclera anicteric  ?Neuro: eyes open, but encephalopathic, restless, spontaneously moving all extremities, dyssynchronous with vent ?CV: s1s2 rrr, no m/r/g ?PULM:  mechanical breath sounds bilaterally without significant wheezing or rhonchi, increased peak pressures improved with sedation ?GI: soft, bsx4 active  ?Extremities: warm/dry, 2+ pitting edema  ?Skin: no rashes or lesions ? ? ? ?Resolved Hospital Problem list   ? ? ?Assessment & Plan:   ?Principal Problem: ?  Sepsis secondary to UTI [facility]) ?Active Problems: ?  Hyponatremia ?  Hypokalemia ?  AKI (acute kidney injury) ([place]) ?  Abnormal LFTs ?  Class II obesity ?  Hepatic steatosis ?  Cholelithiasis ?  Esophageal thickening ?  Acute low back pain ?  Encephalopathy acute ?  Acute respiratory failure ([place]) ?  Radiculopathy of lumbar region ?  E. coli UTI ?Bacterial Meningitis ? ?Acute metabolic encephalopathy ?Secondary to sepsis and bacterial meningitis ?No CVA on MRI ?S/p LP on 5/7 with purulent appearing CSF, 55k WBC's and growing gm + cocci ?Plan ?-continue Vancomycin, Cefepime, Ampicillin, Acyclovir and follow culture results ?-ID was consulted and signed off, will re-engage ?-mental status barrier to SBT/SAT today ?--Maintain full vent support with SAT/SBT as tolerated ?-titrate Vent setting to maintain SpO2 greater than or equal to 90%. ?-HOB elevated 30 degrees. ?-Plateau pressures less than 30 cm H20.  ?-Follow chest x-ray, ABG prn.   ?-Bronchial hygiene and RT/bronchodilator protocol. ? ? ?Severe sepsis secondary to E. coli UTI and bacterial meningitis ?-continue abx as above, not currently requiring pressors  ? ? ?Acute low back pain with radiculopathy secondary to acute herniated nucleus pulposus  ?Plan ?-Pain management ?-Conservative care per n-surg ? ?Acute respiratory failure.   ?Intubated for respiratory protection  ?Plan ?-LLL consolidation, on abx ?-Follow intermittent chest x-ray ? ?Difficult airway ?Airway was anterior and had significant swelling ?Plan ?-Would consider leak test before extubation  ? ?AKI 2/2 sepsis ?Creatinine stable at 1.9, made >4L urine yesterday ?  Plan ?-appears volume up, hold diuresis today given borderline low BP and high volume UOP yesterday  ? ?Fluid and electrolyte imbalance: hypernatremia, hyperchloremia, normal ag metabolic acidosis ?-free water def: 4.75 liters ?Plan ?-Continue D5 water.  Follow sodium ? ?Abnormal LFTs superimposed on known fatty liver  disease  ?Plan ?Trend  ? ?Hyperglycemia ?Plan ?SSI ? ? ? ?Best Practice (right click and "Reselect all SmartList Selections" daily)  ? ?Diet/type: tubefeeds ?DVT prophylaxis: prophylactic heparin  ?GI prophylaxis: PPI ?

## 2022-02-19 NOTE — Progress Notes (Signed)
Received call from [facility] microbiology lab stating that the patients CSF cultures were indeed just gram negative, and not gram positive as previously stated. Droplet precautions continued. Ventura Bruns, RN 02/19/22 1:00 PM  ?

## 2022-02-19 NOTE — Plan of Care (Signed)
?  Interdisciplinary Goals of Care Family Meeting ? ? ?Date carried out:: 02/19/2022 ? ?Location of the meeting: Bedside ? ?Member's involved: Bedside Registered Nurse, Family Member or next of kin, and Other: physician assistant ? ?Durable Power of Tour manager:    ? ?Discussion: We discussed goals of care for [name] .  Discussed patient's current diagnosis and prognosis, and that she is currently critically ill, but her septic shock component is improving. Her renal function is also improving, but her mental status remains altered today and we were unable to wean sedation.  It may be a long course to full recovery depending on how her mental status improves over the next few days.  ? ?Code status: Full Code ? ?Disposition: Continue current acute care ? ? ?Time spent for the meeting: 35 minutes ? ?Gabrielle Vincent ?02/19/2022, 3:21 PM ? ?

## 2022-02-19 NOTE — Progress Notes (Signed)
PT Cancellation Note ? ?Patient Details ?Name: Gabrielle Vincent ?MRN: [id number] ?DOB: August 18, 1966 ? ? ?Cancelled Treatment:    Reason Eval/Treat Not Completed: Medical issues which prohibited therapy, remains in ventilator. Aletha Halim follow  .  ?Tresa Endo PT ?Acute Rehabilitation Services ?Pager [phone] ?Office [phone] ? ? ? ? ?Claretha Cooper ?02/19/2022, 8:05 AM ?

## 2022-02-19 NOTE — Progress Notes (Signed)
EEG complete - results pending 

## 2022-02-19 NOTE — Progress Notes (Addendum)
Initial Nutrition Assessment ? ?DOCUMENTATION CODES:  ? ?Obesity unspecified ? ?INTERVENTION:  ?- once OGT placement confirmed, recommend Osmolite 1.2 @ 20 ml/hr with 45 ml Prosource TF TID. ? ?- recommended goal TF regimen: Osmolite 1.2 @ 60 ml/hr with 45 ml Prosource TF TID. ? ?- this regimen will provide 1848 kcal, 113 grams protein, and 1181 ml free water. ? ? ?NUTRITION DIAGNOSIS:  ? ?Inadequate oral intake related to inability to eat as evidenced by NPO status. ? ?GOAL:  ? ?Provide needs based on ASPEN/SCCM guidelines ? ?MONITOR:  ? ?Vent status, Labs, Weight trends, I & O's ? ?REASON FOR ASSESSMENT:  ? ?Ventilator ? ?ASSESSMENT:  ? ?56 y.o. female with medical history of seasonal allergies, obesity, BLE swelling, varicose veins, HLD, hepatic steatosis, atherosclerosis, and diverticulosis. She presented to the ED due to severe low back pain that radiated to LLE and was associated with hematuria and dysuria. In the ED, she was noted to have multiple electrolyte abnormalities. CT renal stone study showed no obstructive uropathy, severe fatty infiltration of the liver with mild hepatomegaly, cholelithiasis with no acute cholecystitis, colonic diverticulosis, and mild circumferential thickening of the distal esophagus may represent esophagitis. She was admitted for sepsis 2/2 UTI. ? ?Patient discussed in rounds this AM. She was intubated 5/5 for airway protection d/t progressive encephalopathy. OGT placed 5/5 evening and has been to LIS since that time. Pending abdominal x-ray to confirm placement and then CCM states ok to start trickle rate TF. ? ?Patient's brother, whom she lives with, at bedside. He shares that patient does not typically eat meals but rather grazes often throughout the day. No recent changes to eating habits. ? ?She does have BLE weakness and has been walking slower and reporting knee pain. She does not have a walker or cane at home. She had a fall several months to a year ago; no known falls  since that time. ? ?At an unknown point patient began unintentional and quickly gaining weight. Her brother reports this was before they knew she had diabetes.  ? ?Weight today is 219 lb, weight yesterday was 217 lb, and weight on 5/3 was documented as 210 lb and appears to be a stated weight. Prior to that, most recently documented weight was in 01/2016. ? ?She is s/p lumbar puncture 5/7. ? ? ?Patient is currently intubated on ventilator support ?MV: 10.5 L/min ?Temp (24hrs), Avg:98.2 ?F (36.8 ?C), Min:97.5 ?F (36.4 ?C), Max:99 ?F (37.2 ?C) ?Propofol: 14.3 ml/hr (377 kcal/24 hrs) ?BP: 113/52 and MAP: 71 ? ?Labs reviewed; CBGs: 229, 215, 226 mg/dl, Na: 152 mmol/l, K: 3.1 mmol/l, BUN: 38 mg/dl, creatinine: 1.9 mg/dl, Ca: 7.6 mg/dl, Alk Phos elevated, GFR: 31 ml/min. ? ?Medications reviewed; 100 mg colace BID, sliding scale novolog, 40 mg IV protonix/day, 17 g miralax/day, 40 mEq Klor-Con x1 dose 5/8, 10 mEq IV KCl x4 runs 5/8.  ? ?Drips; fentanyl @ 200 mcg/hr, propofol @ 25 mcg/kg/min. ? ?IVF; D5 @ 150 ml/hr (612 kcal/24 hrs). ?  ? ?NUTRITION - FOCUSED PHYSICAL EXAM: ? ?Flowsheet Row Most Recent Value  ?Orbital Region Unable to assess  [ETT holder]  ?Upper Arm Region No depletion  ?Thoracic and Lumbar Region No depletion  ?Buccal Region Unable to assess  [ETT holder]  ?Temple Region No depletion  ?Clavicle Bone Region No depletion  ?Clavicle and Acromion Bone Region No depletion  ?Scapular Bone Region Unable to assess  ?Dorsal Hand Unable to assess  [bilateral mitten]  ?Patellar Region No depletion  ?Anterior Thigh  Region No depletion  ?Posterior Calf Region No depletion  ?Edema (RD Assessment) Moderate  [BLE]  ?Hair Reviewed  ?Eyes Unable to assess  ?Mouth Unable to assess  ?Skin Reviewed  ?Nails Unable to assess  ? ?  ? ? ?Diet Order:   ?Diet Order   ? ?       ?  Diet NPO time specified  Diet effective now       ?  ? ?  ?  ? ?  ? ? ?EDUCATION NEEDS:  ? ?No education needs have been identified at this time ? ?Skin:   Skin Assessment: Reviewed RN Assessment ? ?Last BM:  PTA/unknown ? ?Height:  ? ?Ht Readings from Last 1 Encounters:  ?02/19/22 _0  (1.626 m)  ? ? ?Weight:  ? ?Wt Readings from Last 1 Encounters:  ?02/19/22 99.5 kg  ? ? ?BMI:  Body mass index is 37.65 kg/m?. ? ?Estimated Nutritional Needs:  ?Kcal:  1827 kcal ?Protein:  >/= 103 grams protein ?Fluid:  >/= 1.5 L/day ? ? ? ? ?Jarome Matin, MS, RD, LDN ?Registered Dietitian II ?Inpatient Clinical Nutrition ?RD pager # and on-call/weekend pager # available in [place]  ? ?

## 2022-02-20 DIAGNOSIS — G009 Bacterial meningitis, unspecified: Secondary | ICD-10-CM

## 2022-02-20 DIAGNOSIS — N390 Urinary tract infection, site not specified: Secondary | ICD-10-CM | POA: Diagnosis not present

## 2022-02-20 DIAGNOSIS — A419 Sepsis, unspecified organism: Secondary | ICD-10-CM | POA: Diagnosis not present

## 2022-02-20 LAB — CSF CULTURE W GRAM STAIN

## 2022-02-20 LAB — CULTURE, BLOOD (ROUTINE X 2)
Culture: NO GROWTH
Culture: NO GROWTH
Special Requests: ADEQUATE
Special Requests: ADEQUATE

## 2022-02-20 LAB — SODIUM
Sodium: 151 mmol/L — ABNORMAL HIGH (ref 135–145)
Sodium: 152 mmol/L — ABNORMAL HIGH (ref 135–145)
Sodium: 155 mmol/L — ABNORMAL HIGH (ref 135–145)

## 2022-02-20 LAB — TRIGLYCERIDES: Triglycerides: 438 mg/dL — ABNORMAL HIGH (ref <150)

## 2022-02-20 LAB — CBC
HCT: 34.4 % — ABNORMAL LOW (ref 36.0–46.0)
Hemoglobin: 11.6 g/dL — ABNORMAL LOW (ref 12.0–15.0)
MCH: 30.5 pg (ref 26.0–34.0)
MCHC: 33.7 g/dL (ref 30.0–36.0)
MCV: 90.5 fL (ref 80.0–100.0)
Platelets: 118 10*3/uL — ABNORMAL LOW (ref 150–400)
RBC: 3.8 MIL/uL — ABNORMAL LOW (ref 3.87–5.11)
RDW: 18.4 % — ABNORMAL HIGH (ref 11.5–15.5)
WBC: 18.7 10*3/uL — ABNORMAL HIGH (ref 4.0–10.5)
nRBC: 0 % (ref 0.0–0.2)

## 2022-02-20 LAB — MAGNESIUM
Magnesium: 2.2 mg/dL (ref 1.7–2.4)
Magnesium: 2.1 mg/dL (ref 1.7–2.4)

## 2022-02-20 LAB — PHOSPHORUS
Phosphorus: 2.7 mg/dL (ref 2.5–4.6)
Phosphorus: 2.9 mg/dL (ref 2.5–4.6)

## 2022-02-20 LAB — BASIC METABOLIC PANEL
Anion gap: 4 — ABNORMAL LOW (ref 5–15)
BUN: 33 mg/dL — ABNORMAL HIGH (ref 6–20)
CO2: 16 mmol/L — ABNORMAL LOW (ref 22–32)
Calcium: 7.4 mg/dL — ABNORMAL LOW (ref 8.9–10.3)
Chloride: 128 mmol/L — ABNORMAL HIGH (ref 98–111)
Creatinine, Ser: 1.79 mg/dL — ABNORMAL HIGH (ref 0.44–1.00)
GFR, Estimated: 33 mL/min — ABNORMAL LOW (ref >60)
Glucose, Bld: 263 mg/dL — ABNORMAL HIGH (ref 70–99)
Potassium: 3.5 mmol/L (ref 3.5–5.1)
Sodium: 148 mmol/L — ABNORMAL HIGH (ref 135–145)

## 2022-02-20 LAB — VANCOMYCIN, TROUGH: Vancomycin Tr: 22 ug/mL (ref 15–20)

## 2022-02-20 LAB — GLUCOSE, CAPILLARY
Glucose-Capillary: 248 mg/dL — ABNORMAL HIGH (ref 70–99)
Glucose-Capillary: 211 mg/dL — ABNORMAL HIGH (ref 70–99)
Glucose-Capillary: 239 mg/dL — ABNORMAL HIGH (ref 70–99)
Glucose-Capillary: 216 mg/dL — ABNORMAL HIGH (ref 70–99)
Glucose-Capillary: 207 mg/dL — ABNORMAL HIGH (ref 70–99)
Glucose-Capillary: 244 mg/dL — ABNORMAL HIGH (ref 70–99)

## 2022-02-20 LAB — CYTOLOGY - NON PAP

## 2022-02-20 MED ORDER — INSULIN ASPART 100 UNIT/ML IJ SOLN
2.0000 [IU] | INTRAMUSCULAR | Status: DC
  Administered 2022-02-20 – 2022-02-26 (×27): 2 [IU] via SUBCUTANEOUS

## 2022-02-20 MED ORDER — CHLORHEXIDINE GLUCONATE 0.12 % MT SOLN
OROMUCOSAL | Status: AC
  Filled 2022-02-20: qty 15

## 2022-02-20 MED ORDER — POTASSIUM CHLORIDE 20 MEQ PO PACK
40.0000 meq | PACK | Freq: Once | ORAL | Status: AC
  Administered 2022-02-20: 40 meq
  Filled 2022-02-20: qty 2

## 2022-02-20 MED ORDER — SODIUM CHLORIDE 0.9 % IV SOLN
2.0000 g | Freq: Two times a day (BID) | INTRAVENOUS | Status: DC
  Administered 2022-02-20 – 2022-03-21 (×58): 2 g via INTRAVENOUS
  Filled 2022-02-20 (×59): qty 20

## 2022-02-20 MED ORDER — MIDAZOLAM HCL 2 MG/2ML IJ SOLN
0.0000 mg | INTRAMUSCULAR | Status: DC | PRN
  Administered 2022-02-20 – 2022-02-23 (×7): 2 mg via INTRAVENOUS
  Filled 2022-02-20 (×8): qty 2

## 2022-02-20 MED ORDER — METOPROLOL TARTRATE 5 MG/5ML IV SOLN
10.0000 mg | Freq: Four times a day (QID) | INTRAVENOUS | Status: DC
Start: 2022-02-20 — End: 2022-02-24
  Administered 2022-02-20 – 2022-02-24 (×12): 10 mg via INTRAVENOUS
  Filled 2022-02-20 (×13): qty 10

## 2022-02-20 MED ORDER — DEXTROSE 5 % IV SOLN: INTRAVENOUS | Status: DC

## 2022-02-20 MED ORDER — HYDRALAZINE HCL 20 MG/ML IJ SOLN
10.0000 mg | Freq: Four times a day (QID) | INTRAMUSCULAR | Status: DC | PRN
  Administered 2022-02-21: 10 mg via INTRAVENOUS
  Filled 2022-02-20: qty 1

## 2022-02-20 MED ORDER — OSMOLITE 1.2 CAL PO LIQD
1000.0000 mL | ORAL | Status: DC
  Administered 2022-02-20 – 2022-02-21 (×3): 1000 mL

## 2022-02-20 MED ORDER — FREE WATER
300.0000 mL | Status: DC
  Administered 2022-02-20 – 2022-02-21 (×4): 300 mL

## 2022-02-20 NOTE — Progress Notes (Signed)
eLink Physician-Brief Progress Note ?Patient Name: Gabrielle Vincent ?DOB: 10/25/1965 ?MRN: [id number] ? ? ?Date of Service ? 02/20/2022  ?HPI/Events of Note ? Patient with hypernatremia on D 5 % Water at 150 ml / hour, serum Na+ now 148 meq .  ?eICU Interventions ? D 5 % water gtt reduced to 75 ml / hour.  ? ? ? ?  ? ?[name] ?02/20/2022, 6:09 AM ?

## 2022-02-20 NOTE — Progress Notes (Signed)
Inpatient Diabetes Program Recommendations ? ?AACE/ADA: New Consensus Statement on Inpatient Glycemic Control (2015) ? ?Target Ranges:  Prepandial:   less than 140 mg/dL ?     Peak postprandial:   less than 180 mg/dL (1-2 hours) ?     Critically ill patients:  140 - 180 mg/dL  ? ?Lab Results  ?Component Value Date  ? GLUCAP 211 (H) 02/20/2022  ? HGBA1C 8.9 (H) 02/14/2022  ? ? ?Review of Glycemic Control ? Latest Reference Range & Units 02/19/22 23:36 02/20/22 03:33 02/20/22 07:47  ?Glucose-Capillary 70 - 99 mg/dL 227 (H) 248 (H) 211 (H)  ?(H): Data is abnormally high ? ?Diabetes history: New onset DM ?Outpatient Diabetes medications: None ?Current orders for Inpatient glycemic control: Novolog 0-15 units Q4H, Osmolite 20 ml/hr ? ?Inpatient Diabetes Program Recommendations:   ? ?Might consider Novolog 2 units Q4H tube feed coverage.  Stop if feeds held or discontinued.  ? ?Will continue to follow while inpatient. ? ?Thank you, ?Reche [name], MSN, RN, CDCES ?Diabetes Coordinator ?Inpatient Diabetes Program ?[phone] (team pager from 8a-5p) ? ? ? ?

## 2022-02-20 NOTE — Progress Notes (Signed)
Pharmacy Antibiotic Note ? ?Gabrielle Vincent is a 56 y.o. female admitted on 02/14/2022 with UTI with sepsis.  Antibiotics were broadened to vancomycin, ampicillin, cefepime, acyclovir for meningitis concern, now narrowing.   ? ?Plan: ?Change to Ceftriaxone 2g IV q12h per MD.  ?Pharmacy will sign off.  Please re-consult if further dosing needs arise.   ? ? ?Height: '5\' 4"'$  (162.6 cm) ?Weight: 101.1 kg (222 lb 14.2 oz) ?IBW/kg (Calculated) : 54.7 ? ?Temp (24hrs), Avg:99.4 ?F (37.4 ?C), Min:98.4 ?F (36.9 ?C), Max:100 ?F (37.8 ?C) ? ?Recent Labs  ?Lab 02/14/22 ?1820 02/15/22 ?0425 02/16/22 ?3419 02/16/22 ?3790 02/17/22 ?0430 02/17/22 ?2409 02/17/22 ?1150 02/17/22 ?1600 02/18/22 ?0617 02/18/22 ?1829 02/19/22 ?7353 02/20/22 ?2992  ?WBC  --    < > 39.2*  --  34.3*  --   --   --  24.5*  --  20.2* 18.7*  ?CREATININE  --    < > 2.09*   < > 2.30*  --   --  2.24* 2.24* 2.05* 1.90* 1.79*  ?LATICACIDVEN 1.9  --   --   --   --  1.6 1.3  --  1.5  --   --   --   ? < > = values in this interval not displayed.  ? ?  ?Estimated Creatinine Clearance: 40.6 mL/min (A) (by C-G formula based on SCr of 1.79 mg/dL (H)).   ? ?Allergies  ?Allergen Reactions  ? Erythromycin Anaphylaxis  ? Penicillins Nausea Only  ? Prednisone Other (See Comments)  ?  Lymph node swelling  ? ? ?Antimicrobials this admission:  ?5/4 Rocephin >> 5/5 ?5/5 Vanc >> 5/9 ?5/6 Ampicillin >> 5/9 ?5/6 cefepime>> 5/9 ?5/6 acyclovir >> 5/9 ?5/9 Ceftriaxone >>  ? ?Dose adjustments this admission:  ?5/9 VT:  22 (vanc d/c) ? ?Microbiology results:  ?5/3 Ucx >100k E.coli (pan-sens except amp, amp/sul)  ?5/5 MRSA PCR: not detected ?5/4 Bcx: ngtd ?5/7 CSF: (initially reported GPC, now reporting GNR): moderate Ecoli (pan-sens except amp, amp/sul) ?5/7 HSV-1, HSV-2 DNA Negative  ? ?Thank you for allowing pharmacy to be a part of this patient?s care. ? ?Gretta Arab PharmD, BCPS ?Clinical Pharmacist ?[facility] [phone] ?02/20/2022 8:54 AM ? ? ? ?

## 2022-02-20 NOTE — Progress Notes (Signed)
Nutrition Follow-up ? ?DOCUMENTATION CODES:  ? ?Obesity unspecified ? ?INTERVENTION:  ?- will increase Osmolite 1.2 to 30 ml/hr at this time and by 10 ml every 8 hours to reach goal rate of 60 ml/hr with 45 ml Prosource TF TID. ? ?- at goal rate, this regimen will provide 1848 kcal, 113 grams protein, and 1181 ml free water. ? ?- free water flush to be per CCM.  ? ? ?NUTRITION DIAGNOSIS:  ? ?Inadequate oral intake related to inability to eat as evidenced by NPO status. -ongoing ? ?GOAL:  ? ?Provide needs based on ASPEN/SCCM guidelines -to be met with TF regimen at goal rate.  ? ?MONITOR:  ? ?Vent status, TF tolerance, Labs, Weight trends, Skin ? ? ?ASSESSMENT:  ? ?56 y.o. female with medical history of seasonal allergies, obesity, BLE swelling, varicose veins, HLD, hepatic steatosis, atherosclerosis, and diverticulosis. She presented to the ED due to severe low back pain that radiated to LLE and was associated with hematuria and dysuria. In the ED, she was noted to have multiple electrolyte abnormalities. CT renal stone study showed no obstructive uropathy, severe fatty infiltration of the liver with mild hepatomegaly, cholelithiasis with no acute cholecystitis, colonic diverticulosis, and mild circumferential thickening of the distal esophagus may represent esophagitis. She was admitted for sepsis 2/2 UTI. ? ?Discussed with CCM MD. Patient remains intubated with OGT in place. MD indicates ok to advance TF today. Communicated with RN. ? ?She is receiving Osmolite 1.2 @ 20 ml/hr with 45 ml Prosource TF TID; 300 ml free water every 4 hours started at noon today.  ? ?This regimen is providing 696 kcal, 60 grams protein, and 2194 ml free water.  ? ?Weight +4 lb from yesterday to today. Mild pitting edema to BUE and deep pitting edema to BLE documented in the edema section of flow sheet this AM. ? ? ? ?Patient is currently intubated on ventilator support ?MV: 12.8 L/min ?Temp (24hrs), Avg:99.5 ?F (37.5 ?C), Min:98.8 ?F  (37.1 ?C), Max:100 ?F (37.8 ?C) ?Propofol: 8.6 ml/hr (227 kcal/24 hrs) ?BP: 152/74 and MAP: 97 ? ?Labs reviewed; CBGs: 248, 211, 239 mg/dl, Na: 152 mmol/l, Cl: 128 mmol/l, BUN: 33 mg/dl, creatinine: 1.79 mg/dl, Ca: 7.4 mg/dl, GFR: 33 ml.min. ? ?Medications reviewed; 100 mg colace BID, sliding scale novolog, 2 units novolog every 4 hours, 40 mg IV protonix/day, 17 g miralax/day, 40 mEq Klor-Con x1 dose 5/8 and x1 dose 5/9, 10 mEq IV KCl x4 runs 5/8. ? ?Drips; propofol @ 15 mcg/kg/min, fentanyl @ 150 mcg/hr.  ? ?IVF; D5 @ 50 ml/hr (204 kcal/24 hrs). ? ? ?Diet Order:   ?Diet Order   ? ?       ?  Diet NPO time specified  Diet effective now       ?  ? ?  ?  ? ?  ? ? ?EDUCATION NEEDS:  ? ?No education needs have been identified at this time ? ?Skin:  Skin Assessment: Reviewed RN Assessment ? ?Last BM:  PTA/unknown ? ?Height:  ? ?Ht Readings from Last 1 Encounters:  ?02/19/22 5' 4"  (1.626 m)  ? ? ?Weight:  ? ?Wt Readings from Last 1 Encounters:  ?02/20/22 101.1 kg  ? ? ? ?BMI:  Body mass index is 38.26 kg/m?. ? ?Estimated Nutritional Needs:  ?Kcal:  1827 kcal ?Protein:  >/= 103 grams protein ?Fluid:  >/= 1.5 L/day ? ? ? ? ?Jarome Matin, MS, RD, LDN ?Registered Dietitian II ?Inpatient Clinical Nutrition ?RD pager # and on-call/weekend pager #  available in [place]  ? ?

## 2022-02-20 NOTE — Progress Notes (Signed)
PT Cancellation Note ? ?Patient Details ?Name: Gabrielle Vincent ?MRN: [id number] ?DOB: 04-07-1966 ? ? ?Cancelled Treatment:    Reason Eval/Treat Not Completed: Medical issues which prohibited therapy (per RN pt is intubated and not following commands. PT signing off, please re-order if her status improves.) ? ? ?Blondell Reveal Kistler PT 02/20/2022  ?Acute Rehabilitation Services ? ?Office [phone] ? ?

## 2022-02-20 NOTE — Progress Notes (Signed)
[facility] ADULT ICU REPLACEMENT PROTOCOL ? ? ?The patient does apply for the [facility] Adult ICU Electrolyte Replacment Protocol based on the criteria listed below:  ? ?1.Exclusion criteria: TCTS patients, ECMO patients, and Dialysis patients ?2. Is GFR >/= 30 ml/min? Yes.    ?Patient's GFR today is 33 ?3. Is SCr </= 2? Yes.   ?Patient's SCr is 1.79 mg/dL ?4. Did SCr increase >/= 0.5 in 24 hours? No. ?5.Pt's weight >40kg  Yes.   ?6. Abnormal electrolyte(s): K+ 3.5  ?7. Electrolytes replaced per protocol ?8.  Call MD STAT for K+ </= 2.5, Phos </= 1, or Mag </= 1 ?Physician:  Gabrielle Vincent ? ?Gabrielle Vincent 02/20/2022 5:00 AM  ?

## 2022-02-20 NOTE — Progress Notes (Addendum)
? ?NAME:  Gabrielle Vincent, MRN:  [id number], DOB:  [birth year], LOS: 6 ?ADMISSION DATE:  02/14/2022, CONSULTATION DATE:  5/5 ?REFERRING MD:  Rai, CHIEF COMPLAINT:  acute encephalopathy and sepsis   ? ?History of Present Illness:  ?56 year old female who was admitted on 5/3 with chief complaint of severe low back pain radiating down her left leg.  Also had associated hematuria and dysuria.  She had no weakness although low did say pain impacted her ability to walk.  Also noted poor p.o. intake, decreased urination as well as urinary retention, hematuria, dysuria, and some nausea.  ? ?CT scan was negative for obstructive uropathy did show fatty liver infiltrates, there is cholelithiasis but no acute cholecystitis there is mild circumferential thickening of the distal esophagus global enlargement of the uterus with thickened appearance of the endometrial stripe with radiology recommending nonemergent pelvic ultrasound.  Diagnostic evaluation notable for new AKI with serum creatinine 3.64, acute transaminitis leukocytosis, and hyponatremia of 127 she was admitted for further evaluation, cultures were sent, and she was started on IV ceftriaxone. ? ?On 5/4 patient was noted to be more confused, speech was slurred , Working diagnosis was #1 urinary tract infection with resultant sepsis and also low back pain for which neurosurgery was consulted as MRI finding showed acute herniated L3-L4 disc with left lumbar radiculopathy.  It was felt that pain control via lumbar injection may help in that this could be treated conservatively. ?On 5/4 patient becoming intermittently agitated then lethargic, because of this a CT of head was obtained this was severely limited due to motion degraded meant but there was concern about hypodense area in the right basal ganglia section raising concern for acute infarct.  On 5/5 a rapid response was called the patient was severely agitated, pulled out IVs, pull out Foley catheter ?Later that  afternoon mental status continued to worsen.  Neurology consult was obtained in addition to this infectious disease was consulted with urine growing E. coli and narrowed antibiotics to cefazolin stopped vancomycin and recommended supportive care because her mental status continued to decline, she had severe metabolic derangements, and we were unable to provide medical care on the [place] ward she was transferred to the ICU for higher level of care.  On initial arrival she was agitated requiring several nurses to hold her down, tachypneic tachycardic would not follow commands had marked increased work of breathing.  An intraosseous line was placed in the right lower extremity, she was intubated for airway protection to facilitate further MRI imaging, and a right IJ triple-lumen catheter was placed ? ?Pertinent  Medical History  ? ?Class II obesity, hepatic steatosis, seasonal allergies, varicose veins diverticulosis, HL  ? ?Significant Hospital Events: ?Including procedures, antibiotic start and stop dates in addition to other pertinent events   ?5/3 admitted with back pain and urinary tract infection , Further complicated by acute kidney injury hyponatremia and multiple metabolic derangements.  CT imaging for renal stones showed no acute abdominal pelvic findings there was no obstructive uropathy there was severe fatty liver disease there was cholelithiasis but no cholecystitis there is colonic diverticulosis but no diverticulitis, mild circumferential thickening of the esophagus globular enlargement of the uterus with radiology recommending nonemergent pelvic ultrasound found to have uterine fibroid abdominal Ultrasound showed fatty liver but was epididymides negative.  An MRI of the lumbar spine showed small left subarticular disc extrusion with inferior migration at L3-L4 correlating with radiculopathy.  She was started on ceftriaxone cultures were sent ?5/4 increased confusion CT  brain obtained raising concern  for possible acute infarct in the right basal ganglia ?5/5 progressive encephalopathy , Worsening leukocytosis, hypernatremia, hyperchloremia, slowly improving renal function, slowly improving procalcitonin, moved to ICU due to delirium, intubated for airway protection and to facilitate MRI imaging right IJ central line placed due to limited IV access.  Seen by neurology, also seen by infectious disease with ceftriaxone changed to cefazolin ?5/6 antibiotics changed to meningitis coverage given MRI findings of fluid in the ventricles.  Image guided LP ordered ?5/8 LP done with purulent CSF, studies consistent with bacterial meningitis, culture growing gm + cocci ? ?Interim History / Subjective:  ?NAEON. ?Remains encephalopathic on vent. ? ?Objective   ?Blood pressure (!) 150/66, pulse 95, temperature 99.7 ?F (37.6 ?C), resp. rate (!) 24, height '5\' 4"'$  (1.626 m), weight 101.1 kg, SpO2 97 %. ?CVP:  [8 mmHg-15 mmHg] 15 mmHg  ?Vent Mode: PSV ?FiO2 (%):  [30 %-40 %] 30 % ?Set Rate:  [24 bmp] 24 bmp ?Vt Set:  [440 mL] 440 mL ?PEEP:  [5 cmH20] 5 cmH20 ?Pressure Support:  [5 cmH20] 5 cmH20 ?Plateau Pressure:  [12 cmH20-19 cmH20] 12 cmH20  ? ?Intake/Output Summary (Last 24 hours) at 02/20/2022 0815 ?Last data filed at 02/20/2022 5102 ?Gross per 24 hour  ?Intake 7032.22 ml  ?Output 5550 ml  ?Net 1482.22 ml  ? ? ?Filed Weights  ? 02/18/22 0500 02/19/22 0500 02/20/22 0500  ?Weight: 98.5 kg 99.5 kg 101.1 kg  ? ? ?General:  critically ill-appearing F, in NAD ?HEENT: MM pink/moist, ETT in place, sclera anicteric  ?Neuro: Sedated due to dyssynchrony. Encephalopathic but moves all extremities ?CV: RRR, no M/R/G ?PULM:  CTAB, no W/R/R ?GI: soft, bsx4 active  ?Extremities: warm/dry, 1+ pitting edema  ?Skin: no rashes or lesions ? ?Assessment & Plan:  ?Principal Problem: ?  Sepsis secondary to UTI [facility]) ?Active Problems: ?  Hyponatremia ?  Hypokalemia ?  AKI (acute kidney injury) ([place]) ?  Abnormal LFTs ?  Class II obesity ?  Hepatic  steatosis ?  Cholelithiasis ?  Esophageal thickening ?  Acute low back pain ?  Encephalopathy acute ?  Acute respiratory failure ([place]) ?  Radiculopathy of lumbar region ?  E. coli UTI ?  Meningitis ?Bacterial Meningitis ? ? ?Acute metabolic encephalopathy - Secondary to E.coli UTI  and bacterial meningitis (/p LP on 5/7 with purulent appearing CSF, 55k WBC's and growing GNRs). MRI neg for CVA. ?- Continue Vancomycin, Cefepime, Ampicillin, Acyclovir and follow cultures through completion for susceptibilities ?- ID was consulted and signed off, asked to re-engage 5/8 ? ?Acute hypoxic respiratory failure - 2/2 above. ?- Continue full vent support ?- Mental status remains a barrier for WUA/SBT. This will likely take some time. ?- Follow chest x-ray, ABG prn.   ?- Bronchial hygiene and RT/bronchodilator protocol. ? ?Difficult airway - Airway was anterior and had significant swelling ?-Would consider leak test before extubation and clear ability to follow commands etc ? ?Severe sepsis secondary to E. coli UTI and bacterial meningitis ?- Continue abx as above, not currently requiring pressors  ?- Follow cultures through completion ? ?Acute low back pain with radiculopathy secondary to acute herniated nucleus pulposus  ?- Pain management ?- Conservative care per n-surg, can see in follow up after acute issues resolved if pain is ongoing ? ?AKI 2/2 sepsis - improving ?Fluid and electrolyte imbalance: hypernatremia (FWD 2.9 liters), hyperchloremia, normal ag metabolic acidosis  ?- Continue supportive care ?- Decrease D5 from 75 to 50/hr ?-  Continue FWF, increase from 200 to 300 q4hrs ? ?Abnormal LFTs superimposed on known fatty liver disease  ?- Trend LFTs' intermittently ? ?Hyperglycemia - exacerbated by D5W ?- Continue SSI ?- Add Novolog 2u q4hrs as TF coverage, hold if TF held ?- Decrease D5W from 75 to 50/hr ? ? ?Best Practice (right click and "Reselect all SmartList Selections" daily)  ? ?Diet/type: tubefeeds ?DVT  prophylaxis: prophylactic heparin  ?GI prophylaxis: PPI ?Lines: Central line ?Foley:  Yes, and it is still needed ?Code Status:  full code ?Last date of multidisciplinary goals of care discussion [pending, will try to reach

## 2022-02-21 ENCOUNTER — Inpatient Hospital Stay (HOSPITAL_COMMUNITY): Payer: BC Managed Care – PPO

## 2022-02-21 DIAGNOSIS — N390 Urinary tract infection, site not specified: Secondary | ICD-10-CM | POA: Diagnosis not present

## 2022-02-21 DIAGNOSIS — E232 Diabetes insipidus: Secondary | ICD-10-CM

## 2022-02-21 DIAGNOSIS — A419 Sepsis, unspecified organism: Secondary | ICD-10-CM | POA: Diagnosis not present

## 2022-02-21 LAB — SODIUM
Sodium: 158 mmol/L — ABNORMAL HIGH (ref 135–145)
Sodium: 159 mmol/L — ABNORMAL HIGH (ref 135–145)
Sodium: 159 mmol/L — ABNORMAL HIGH (ref 135–145)
Sodium: 159 mmol/L — ABNORMAL HIGH (ref 135–145)
Sodium: 159 mmol/L — ABNORMAL HIGH (ref 135–145)
Sodium: 161 mmol/L (ref 135–145)
Sodium: 161 mmol/L (ref 135–145)
Sodium: 164 mmol/L (ref 135–145)

## 2022-02-21 LAB — CBC
HCT: 39.3 % (ref 36.0–46.0)
Hemoglobin: 12.6 g/dL (ref 12.0–15.0)
MCH: 29.4 pg (ref 26.0–34.0)
MCHC: 32.1 g/dL (ref 30.0–36.0)
MCV: 91.8 fL (ref 80.0–100.0)
Platelets: 145 10*3/uL — ABNORMAL LOW (ref 150–400)
RBC: 4.28 MIL/uL (ref 3.87–5.11)
RDW: 18.4 % — ABNORMAL HIGH (ref 11.5–15.5)
WBC: 19.2 10*3/uL — ABNORMAL HIGH (ref 4.0–10.5)
nRBC: 0 % (ref 0.0–0.2)

## 2022-02-21 LAB — GLUCOSE, CAPILLARY
Glucose-Capillary: 150 mg/dL — ABNORMAL HIGH (ref 70–99)
Glucose-Capillary: 151 mg/dL — ABNORMAL HIGH (ref 70–99)
Glucose-Capillary: 184 mg/dL — ABNORMAL HIGH (ref 70–99)
Glucose-Capillary: 203 mg/dL — ABNORMAL HIGH (ref 70–99)
Glucose-Capillary: 250 mg/dL — ABNORMAL HIGH (ref 70–99)
Glucose-Capillary: 258 mg/dL — ABNORMAL HIGH (ref 70–99)

## 2022-02-21 LAB — BASIC METABOLIC PANEL
BUN: 35 mg/dL — ABNORMAL HIGH (ref 6–20)
CO2: 18 mmol/L — ABNORMAL LOW (ref 22–32)
Calcium: 8.3 mg/dL — ABNORMAL LOW (ref 8.9–10.3)
Chloride: <130 mmol/L — ABNORMAL HIGH (ref 98–111)
Creatinine, Ser: 1.54 mg/dL — ABNORMAL HIGH (ref 0.44–1.00)
GFR, Estimated: 39 mL/min — ABNORMAL LOW (ref >60)
Glucose, Bld: 287 mg/dL — ABNORMAL HIGH (ref 70–99)
Potassium: 3.5 mmol/L (ref 3.5–5.1)
Sodium: 161 mmol/L (ref 135–145)

## 2022-02-21 LAB — OSMOLALITY, URINE
Osmolality, Ur: 209 mOsm/kg — ABNORMAL LOW (ref 300–900)
Osmolality, Ur: 419 mOsm/kg (ref 300–900)
Osmolality, Ur: 401 mOsm/kg (ref 300–900)

## 2022-02-21 LAB — OSMOLALITY: Osmolality: 367 mOsm/kg (ref 275–295)

## 2022-02-21 LAB — PHOSPHORUS: Phosphorus: 3.1 mg/dL (ref 2.5–4.6)

## 2022-02-21 LAB — MAGNESIUM: Magnesium: 2.2 mg/dL (ref 1.7–2.4)

## 2022-02-21 MED ORDER — DEXTROSE 5 % IV SOLN: INTRAVENOUS | Status: DC

## 2022-02-21 MED ORDER — FREE WATER
400.0000 mL | Status: DC
  Administered 2022-02-21 – 2022-02-23 (×16): 400 mL

## 2022-02-21 MED ORDER — DEXMEDETOMIDINE HCL IN NACL 200 MCG/50ML IV SOLN
0.4000 ug/kg/h | INTRAVENOUS | Status: DC
  Administered 2022-02-21 – 2022-02-22 (×6): 0.4 ug/kg/h via INTRAVENOUS
  Administered 2022-02-22: 0.5 ug/kg/h via INTRAVENOUS
  Administered 2022-02-22: 0.4 ug/kg/h via INTRAVENOUS
  Administered 2022-02-22 – 2022-02-23 (×2): 0.5 ug/kg/h via INTRAVENOUS
  Filled 2022-02-21 (×11): qty 50

## 2022-02-21 MED ORDER — FENTANYL 2500MCG IN NS 250ML (10MCG/ML) PREMIX INFUSION
0.0000 ug/h | INTRAVENOUS | Status: DC
  Administered 2022-02-21: 225 ug/h via INTRAVENOUS
  Administered 2022-02-21 – 2022-02-22 (×2): 175 ug/h via INTRAVENOUS
  Administered 2022-02-22: 88 ug/h via INTRAVENOUS
  Administered 2022-02-22 – 2022-02-23 (×2): 175 ug/h via INTRAVENOUS
  Administered 2022-02-23: 375 ug/h via INTRAVENOUS
  Administered 2022-02-24: 250 ug/h via INTRAVENOUS
  Administered 2022-02-25: 150 ug/h via INTRAVENOUS
  Administered 2022-02-25 – 2022-02-26 (×3): 175 ug/h via INTRAVENOUS
  Administered 2022-02-27: 150 ug/h via INTRAVENOUS
  Administered 2022-02-27: 75 ug/h via INTRAVENOUS
  Administered 2022-02-28: 200 ug/h via INTRAVENOUS
  Administered 2022-03-01: 20 ug/h via INTRAVENOUS
  Filled 2022-02-21 (×14): qty 250

## 2022-02-21 MED ORDER — DESMOPRESSIN ACETATE 4 MCG/ML IJ SOLN
1.0000 ug | Freq: Two times a day (BID) | INTRAMUSCULAR | Status: DC
  Administered 2022-02-21: 1 ug via INTRAVENOUS
  Filled 2022-02-21: qty 1

## 2022-02-21 MED ORDER — POTASSIUM CHLORIDE 20 MEQ PO PACK
40.0000 meq | PACK | Freq: Once | ORAL | Status: AC
  Administered 2022-02-21: 40 meq
  Filled 2022-02-21: qty 2

## 2022-02-21 MED ORDER — DESMOPRESSIN ACETATE 4 MCG/ML IJ SOLN
1.0000 ug | Freq: Four times a day (QID) | INTRAMUSCULAR | Status: AC
  Administered 2022-02-21 (×2): 1 ug via INTRAVENOUS
  Filled 2022-02-21 (×2): qty 1

## 2022-02-21 NOTE — Progress Notes (Signed)
[facility] ADULT ICU REPLACEMENT PROTOCOL ? ? ?The patient does apply for the [facility] Adult ICU Electrolyte Replacment Protocol based on the criteria listed below:  ? ?1.Exclusion criteria: TCTS patients, ECMO patients, and Dialysis patients ?2. Is GFR >/= 30 ml/min? Yes.    ?Patient's GFR today is 39 ?3. Is SCr </= 2? Yes.   ?Patient's SCr is 1.54 mg/dL ?4. Did SCr increase >/= 0.5 in 24 hours? No. ?5.Pt's weight >40kg  Yes.   ?6. Abnormal electrolyte(s): K+ 3.5  ?7. Electrolytes replaced per protocol ?8.  Call MD STAT for K+ </= 2.5, Phos </= 1, or Mag </= 1 ?Physician:  Lucile Shutters ? ?Margaret Pyle 02/21/2022 5:00 AM  ?

## 2022-02-21 NOTE — Progress Notes (Signed)
Subjective: ?56 year old female who presented with signs and symptoms of UTI found to have gram-negative sepsis complicated by E. coli meningitis.  Patient was originally evaluated for seizure with no signs on EEG.  Patient's hospital course was complicated by acute hypoxic respiratory failure requiring intubation.  On evaluation today patient is intubated and sedated and unable to participate with interview. ? ?Exam: ?Vitals:  ? 02/21/22 1134 02/21/22 1200  ?BP:  (!) 111/55  ?Pulse:  81  ?Resp:  (!) 24  ?Temp:  99.9 ?F (37.7 ?C)  ?SpO2: 98% 97%  ? ?Gen: In bed, NAD ?Resp:Intubated ?Abd: soft, nt ? ?Neuro: ?MS: sedated ?SL:PNPYYFR intact, pinpoint pupils that are reactive to light. ?Motor: Unable to perform ?Sensory: Unable to perform ? ?Pertinent Labs: ? ?  Latest Ref Rng & Units 02/21/2022  ?  4:05 AM 02/20/2022  ?  4:18 AM 02/19/2022  ?  6:25 AM  ?CBC  ?WBC 4.0 - 10.5 K/uL 19.2   18.7   20.2    ?Hemoglobin 12.0 - 15.0 g/dL 12.6   11.6   11.8    ?Hematocrit 36.0 - 46.0 % 39.3   34.4   35.7    ?Platelets 150 - 400 K/uL 145   118   106    ? ? ?  Latest Ref Rng & Units 02/21/2022  ?  4:05 AM 02/20/2022  ?  4:18 AM 02/19/2022  ?  6:25 AM  ?CBC  ?WBC 4.0 - 10.5 K/uL 19.2   18.7   20.2    ?Hemoglobin 12.0 - 15.0 g/dL 12.6   11.6   11.8    ?Hematocrit 36.0 - 46.0 % 39.3   34.4   35.7    ?Platelets 150 - 400 K/uL 145   118   106    ? ?CSF culture: E coli  ? ?Impression:  ? ?E coli UTI complicated by GN bacteremia/Sepsis  ?Management per primary team. ? ?E. coli meningitis ?Patient has CSF fluid positive for E coli on ceftriaxone.  Patient developed acute hypoxic respiratory failure today requiring intubation.  There was some concern for possible seizure-like activity.  EEG was performed without evidence of seizures. ? ?Hypernatremia: ?Patient developed central diabetes insipidus resulting hyponatremia as high as 160s.  Patient was started on DDAVP per nephrology with a goal sodium of 154 tomorrow. ? ? ?Recommendations: ? ?1)  continue ceftriaxone ?2) Seizure precautions ?3) please page neurology for evaluation if patient develops seizure-like activity.  We will decide if patient needs subsequent EEG. ?4) No need for AED at this time.  ? ?Lawerance Cruel, D.O.  ?Internal Medicine Resident, PGY-3 ?[facility]  ?1:14 PM, 02/21/2022  ? ? ? ?

## 2022-02-21 NOTE — Progress Notes (Signed)
eLink Physician-Brief Progress Note ?Patient Name: Gabrielle Vincent ?DOB: [birth year] ?MRN: [id number] ? ? ?Date of Service ? 02/21/2022  ?HPI/Events of Note ? Patient with sub-optimal sedation on the ventilator, she's hypertensive (SBP 200's) and tachycardic, and PRN Versed and Fentanyl pushes are inadequate.  ?eICU Interventions ? Fentanyl gtt ceiling increased to 400 mcg, Precedex gtt ordered.  ? ? ? ?  ? ?Frederik Pear ?02/21/2022, 4:02 AM ?

## 2022-02-21 NOTE — Progress Notes (Signed)
PCCM Progress Note ? ?STAT CT head completed. Reviewed imaging with Dr. Saintclair Halsted, [place]. No significant hydrocephalus or edema warranting surgical intervention. Agree to medical manage DI. Appreciate discussion. ? ? ?

## 2022-02-21 NOTE — Progress Notes (Signed)
eLink Physician-Brief Progress Note ?Patient Name: Gabrielle Vincent ?DOB: 04-21-1966 ?MRN: [id number] ? ? ?Date of Service ? 02/21/2022  ?HPI/Events of Note ? Serum sodium 161.  ?eICU Interventions ? Free water increased to 400 ml  Q 4 hours.  ? ? ? ?  ? ?Frederik Pear ?02/21/2022, 1:13 AM ?

## 2022-02-21 NOTE — Plan of Care (Signed)
?  Problem: Nutrition: ?Goal: Adequate nutrition will be maintained ?Outcome: Progressing ?  ?Problem: Clinical Measurements: ?Goal: Cardiovascular complication will be avoided ?Outcome: Not Progressing ?  ?Problem: Skin Integrity: ?Goal: Risk for impaired skin integrity will decrease ?Outcome: Not Progressing ?  ?

## 2022-02-21 NOTE — Progress Notes (Signed)
eLink Physician-Brief Progress Note ?Patient Name: Gabrielle Vincent ?DOB: 16-Feb-1966 ?MRN: [id number] ? ? ?Date of Service ? 02/21/2022  ?HPI/Events of Note ? Notified of rash  ?eICU Interventions ? Noted ~5 skin colored papules on the index finger, dorsum of hand.  ??verruca  ?No intervention required at this time.  ?Will continue to monitor.   ? ? ? ?  ? ?[place] ?02/21/2022, 10:11 PM ?

## 2022-02-21 NOTE — Progress Notes (Signed)
? ?NAME:  Gabrielle Vincent, MRN:  [id number], DOB:  1965/12/15, LOS: 7 ?ADMISSION DATE:  02/14/2022, CONSULTATION DATE:  5/5 ?REFERRING MD:  Rai, CHIEF COMPLAINT:  acute encephalopathy and sepsis   ? ?History of Present Illness:  ?56 year old female who was admitted on 5/3 with chief complaint of severe low back pain radiating down her left leg.  Also had associated hematuria and dysuria.  She had no weakness although low did say pain impacted her ability to walk.  Also noted poor p.o. intake, decreased urination as well as urinary retention, hematuria, dysuria, and some nausea.  ? ?CT scan was negative for obstructive uropathy did show fatty liver infiltrates, there is cholelithiasis but no acute cholecystitis there is mild circumferential thickening of the distal esophagus global enlargement of the uterus with thickened appearance of the endometrial stripe with radiology recommending nonemergent pelvic ultrasound.  Diagnostic evaluation notable for new AKI with serum creatinine 3.64, acute transaminitis leukocytosis, and hyponatremia of 127 she was admitted for further evaluation, cultures were sent, and she was started on IV ceftriaxone. ? ?On 5/4 patient was noted to be more confused, speech was slurred , Working diagnosis was #1 urinary tract infection with resultant sepsis and also low back pain for which neurosurgery was consulted as MRI finding showed acute herniated L3-L4 disc with left lumbar radiculopathy.  It was felt that pain control via lumbar injection may help in that this could be treated conservatively. ?On 5/4 patient becoming intermittently agitated then lethargic, because of this a CT of head was obtained this was severely limited due to motion degraded meant but there was concern about hypodense area in the right basal ganglia section raising concern for acute infarct.  On 5/5 a rapid response was called the patient was severely agitated, pulled out IVs, pull out Foley catheter ?Later that  afternoon mental status continued to worsen.  Neurology consult was obtained in addition to this infectious disease was consulted with urine growing E. coli and narrowed antibiotics to cefazolin stopped vancomycin and recommended supportive care because her mental status continued to decline, she had severe metabolic derangements, and we were unable to provide medical care on the [place] ward she was transferred to the ICU for higher level of care.  On initial arrival she was agitated requiring several nurses to hold her down, tachypneic tachycardic would not follow commands had marked increased work of breathing.  An intraosseous line was placed in the right lower extremity, she was intubated for airway protection to facilitate further MRI imaging, and a right IJ triple-lumen catheter was placed ? ?Pertinent  Medical History  ? ?Class II obesity, hepatic steatosis, seasonal allergies, varicose veins diverticulosis, HL  ? ?Significant Hospital Events: ?Including procedures, antibiotic start and stop dates in addition to other pertinent events   ?5/3 admitted with back pain and urinary tract infection , Further complicated by acute kidney injury hyponatremia and multiple metabolic derangements.  CT imaging for renal stones showed no acute abdominal pelvic findings there was no obstructive uropathy there was severe fatty liver disease there was cholelithiasis but no cholecystitis there is colonic diverticulosis but no diverticulitis, mild circumferential thickening of the esophagus globular enlargement of the uterus with radiology recommending nonemergent pelvic ultrasound found to have uterine fibroid abdominal Ultrasound showed fatty liver but was epididymides negative.  An MRI of the lumbar spine showed small left subarticular disc extrusion with inferior migration at L3-L4 correlating with radiculopathy.  She was started on ceftriaxone cultures were sent ?5/4 increased confusion CT  brain obtained raising concern  for possible acute infarct in the right basal ganglia ?5/5 progressive encephalopathy , Worsening leukocytosis, hypernatremia, hyperchloremia, slowly improving renal function, slowly improving procalcitonin, moved to ICU due to delirium, intubated for airway protection and to facilitate MRI imaging right IJ central line placed due to limited IV access.  Seen by neurology, also seen by infectious disease with ceftriaxone changed to cefazolin ?5/6 antibiotics changed to meningitis coverage given MRI findings of fluid in the ventricles.  Image guided LP ordered ?5/8 LP done with purulent CSF, studies consistent with bacterial meningitis, culture growing gm + cocci ? ?Interim History / Subjective:  ?Sodium climbing still and UOP overnight 7.1L. 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 ?Agitation overnight requiring precedex in addition to fentanyl.  Question whether agitation is related to hypernatremia. ? ?Objective   ?Blood pressure (!) 105/55, pulse 77, temperature 99.1 ?F (37.3 ?C), resp. rate (!) 26, height '5\' 4"'$  (1.626 m), weight 99.8 kg, SpO2 100 %. ?   ?Vent Mode: PSV ?FiO2 (%):  [30 %] 30 % ?Set Rate:  [24 bmp] 24 bmp ?Vt Set:  [440 mL] 440 mL ?PEEP:  [5 cmH20] 5 cmH20 ?Pressure Support:  [8 cmH20-10 cmH20] 8 cmH20 ?Plateau Pressure:  [11 cmH20-23 cmH20] 16 cmH20  ? ?Intake/Output Summary (Last 24 hours) at 02/21/2022 0827 ?Last data filed at 02/21/2022 [phone] ?Gross per 24 hour  ?Intake 4574.62 ml  ?Output 7150 ml  ?Net -2575.38 ml  ? ? ?Filed Weights  ? 02/19/22 0500 02/20/22 0500 02/21/22 0500  ?Weight: 99.5 kg 101.1 kg 99.8 kg  ? ? ?General:  critically ill-appearing F, in NAD ?HEENT: MM pink/moist, ETT in place, sclera anicteric  ?Neuro: Sedated due to dyssynchrony. Encephalopathic but moves all extremities if stimulated ?CV: RRR, no M/R/G ?PULM:  CTAB, no W/R/R ?GI: soft, bsx4 active  ?Extremities: warm/dry, 1+ pitting edema  ?Skin: no rashes or lesions ? ?Assessment & Plan:  ?Principal Problem: ?   Sepsis secondary to UTI [facility]) ?Active Problems: ?  Hyponatremia ?  Hypokalemia ?  AKI (acute kidney injury) ([place]) ?  Abnormal LFTs ?  Class II obesity ?  Hepatic steatosis ?  Cholelithiasis ?  Esophageal thickening ?  Acute low back pain ?  Encephalopathy acute ?  Acute respiratory failure ([place]) ?  Radiculopathy of lumbar region ?  E. coli UTI ?  Meningitis ?  Bacterial meningitis ?Bacterial Meningitis ? ? ?Acute metabolic encephalopathy - Secondary to E.coli UTI  and E.coli  meningitis (s/p LP on 5/7 with purulent appearing CSF with cultures pos for E.coli pan S except for ampicillin/unasyn). MRI neg for CVA. CSF cultures with E.coli ?- Continue Ceftriaxone (narrowed 5/9). ?- ID was consulted and signed off.  Don't think need them back on board at this point ?- Continue fentanyl gtt and precedex gtt while on vent.  Propofol d/c'd 5/9 due to hypertriglyceridemia ? ?Acute hypoxic respiratory failure - 2/2 above. ?- Continue full vent support ?- Mental status remains a barrier for WUA/SBT. This will likely take some time. ?- Follow chest x-ray, ABG prn.   ?- Bronchial hygiene and RT/bronchodilator protocol. ? ?Difficult airway - Airway was anterior and had significant swelling ?-Would consider leak test before extubation and clear ability to follow commands etc ? ?Severe sepsis secondary to E. coli UTI and E.coli meningitis ?- Continue CTX ?- Continue supportive care ? ?Acute low back pain with radiculopathy secondary to acute herniated nucleus pulposus  ?- Pain management ?- Conservative care per n-surg, can  see in follow up after acute issues resolved if pain is ongoing ? 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  Has had climbing Na and overnight had 7.1L UOP. Clinically, this fits with a central DI ?- Send urine and serum osmoles for confirmation / completion ?- Start DDAVP at 66mg IV BID, titrate based on UOP and Na improvement.  Aim for gradual correction with goal 0.586ml/L per hour initially ?- Na checks q4hrs  with above goal in mind ?- Consider nephrology consult if no improvement by AM 5/11 ?- Continue D5W and aggressive free water flushes per tube ? ?AKI 2/2 sepsis - improving ?- Continue supportive care ?- Follow

## 2022-02-21 NOTE — IPAL (Signed)
?  Interdisciplinary Goals of Care Family Meeting ? ? ?Date carried out: 02/21/2022 ? ?Location of the meeting: Bedside ? ?Member's involved: Physician and Family Member or next of kin ? ?Durable Power of Attorney or acting medical decision maker: Baird Kay, sister   ? ?Discussion: We discussed goals of care for [facility] .  We discussed patients hospital course including worsening electrolyte abnormalities concerning for central DI. Her prognosis remains guarded pending her response to medical management and expect slow recovery overall. We discussed goals of care in the long term and at this time she is unsure if patient would want prolonged ventilation and will discuss with her siblings. For now family wishes to be remain optimistic and continue current aggressive care. ? ?Code status: Full Code ? ?Disposition: Continue current acute care ? ?Time spent for the meeting: 20 min ? ? ? ?Verne [place] Rodman Pickle, MD ? ?02/21/2022, 7:30 PM ? ? ?

## 2022-02-21 NOTE — Progress Notes (Signed)
eLink Physician-Brief Progress Note ?Patient Name: Gabrielle Vincent ?DOB: [birth year] ?MRN: [id number] ? ? ?Date of Service ? 02/21/2022  ?HPI/Events of Note ? Serum sodium 161  ?eICU Interventions ? Continue aggressive free water repletion.  ? ? ? ?  ? ?Frederik Pear ?02/21/2022, 5:44 AM ?

## 2022-02-21 NOTE — Consult Note (Addendum)
[facility]  INPATIENT CONSULTATION  Reason for Consultation: hypernatremia Requesting Provider: Dr. Everardo All  HPI: Gabrielle Vincent is an 56 y.o. female with obesity and allergies currently admitted for sepsis secondary to bacterial meningitis and nephrology is consulted for evaluation and management of hypernatremia.   Initially presented 5/3 with back pain; found to have AKI Cr 3.6, WBC 21k and admitted.  Clinical status deteriorated and she was moved to ICU.  Ultimately dx with E coli UTI and meningitis.  She's being treated with ceftriaxone.   AKI has improved with Cr trending down to 1.5 today.    Serum sodium has been in the 147-153 range but in the past 24hrs has rose 161 > 164 with concurrent increase in UOP to 7.1L yesterday.  Neurology is concerned for increased ICP and neurosurgery has recommended stat head CT.  She received DDAVP 9:40a and is currently on D5W 100/hr and FWF 400 q4h.  Urine osm ordered.    PMH: Past Medical History:  Diagnosis Date   Aortic atherosclerosis (HCC) 02/14/2022   Class II obesity 02/14/2022   Diverticulosis 02/14/2022   Hepatic steatosis 02/14/2022   Hyperlipidemia 02/07/2016   Seasonal allergies    Swelling of lower extremity    bilateral   Varicose veins    right leg   PSH: Past Surgical History:  Procedure Laterality Date   lymphnode drainage surgery       Past Medical History:  Diagnosis Date   Aortic atherosclerosis (HCC) 02/14/2022   Class II obesity 02/14/2022   Diverticulosis 02/14/2022   Hepatic steatosis 02/14/2022   Hyperlipidemia 02/07/2016   Seasonal allergies    Swelling of lower extremity    bilateral   Varicose veins    right leg    Medications:  I have reviewed the patient's current medications.  Medications Prior to Admission  Medication Sig Dispense Refill   ibuprofen (ADVIL) 200 MG tablet Take 200 mg by mouth every 6 (six) hours as needed.     diphenhydrAMINE (BENADRYL) 25 MG tablet Take 2 tablets (50 mg  total) by mouth every 6 (six) hours. Take 1-2 tablets every 6 hours x 2 days, then space out to an as needed basis (Patient not taking: Reported on 02/14/2022) 20 tablet 0   predniSONE (STERAPRED UNI-PAK 21 TAB) 10 MG (21) TBPK tablet Take 1 tablet (10 mg total) by mouth daily. Take 6 tabs by mouth daily  for 2 days, then 5 tabs for 2 days, then 4 tabs for 2 days, then 3 tabs for 2 days, 2 tabs for 2 days, then 1 tab by mouth daily for 2 days (Patient not taking: Reported on 02/14/2022) 42 tablet 0   ranitidine (ZANTAC) 150 MG tablet Take 1 tablet (150 mg total) by mouth 2 (two) times daily. (Patient not taking: Reported on 02/14/2022) 60 tablet 0    ALLERGIES:   Allergies  Allergen Reactions   Erythromycin Anaphylaxis   Penicillins Nausea Only   Prednisone Other (See Comments)    Lymph node swelling    FAM HX: History reviewed. No pertinent family history.  Social History:   reports that she has never smoked. She does not have any smokeless tobacco history on file. She reports that she does not drink alcohol and does not use drugs.  ROS: unable to obtain from intubated patient  Blood pressure (!) 102/52, pulse 96, temperature 100 F (37.8 C), resp. rate (!) 24, height 5\' 4"  (1.626 m), weight 99.8 kg, SpO2 98 %. PHYSICAL EXAM:  Gen: intubated, arouses to voice  Eyes:  pupils 2-85mm and equal ENT: ETT in place CV: RRR Abd:  soft Lungs: coarse BL on vent GU: foley with clear urine Extr:  no edema Neuro: arouses to voice, does not follow commands    Results for orders placed or performed during the hospital encounter of 02/14/22 (from the past 48 hour(s))  Glucose, capillary     Status: Abnormal   Collection Time: 02/19/22 11:48 AM  Result Value Ref Range   Glucose-Capillary 226 (H) 70 - 99 mg/dL    Comment: Glucose reference range applies only to samples taken after fasting for at least 8 hours.  Glucose, capillary     Status: Abnormal   Collection Time: 02/19/22  3:40 PM  Result  Value Ref Range   Glucose-Capillary 179 (H) 70 - 99 mg/dL    Comment: Glucose reference range applies only to samples taken after fasting for at least 8 hours.  Sodium     Status: Abnormal   Collection Time: 02/19/22  4:13 PM  Result Value Ref Range   Sodium 153 (H) 135 - 145 mmol/L    Comment: Performed at [facility], [address], [place], [place] [address]  Magnesium     Status: None   Collection Time: 02/19/22  4:13 PM  Result Value Ref Range   Magnesium 2.0 1.7 - 2.4 mg/dL    Comment: Performed at [facility], [address] [place], [place] [address]  Phosphorus     Status: None   Collection Time: 02/19/22  4:13 PM  Result Value Ref Range   Phosphorus 3.3 2.5 - 4.6 mg/dL    Comment: Performed at [facility], [address] [place], [place] [address]  Glucose, capillary     Status: Abnormal   Collection Time: 02/19/22  7:47 PM  Result Value Ref Range   Glucose-Capillary 193 (H) 70 - 99 mg/dL    Comment: Glucose reference range applies only to samples taken after fasting for at least 8 hours.  Glucose, capillary     Status: Abnormal   Collection Time: 02/19/22 11:36 PM  Result Value Ref Range   Glucose-Capillary 227 (H) 70 - 99 mg/dL    Comment: Glucose reference range applies only to samples taken after fasting for at least 8 hours.  Sodium     Status: Abnormal   Collection Time: 02/20/22 12:17 AM  Result Value Ref Range   Sodium 151 (H) 135 - 145 mmol/L    Comment: Performed at [facility], [address], [place], [place] [address]  Glucose, capillary     Status: Abnormal   Collection Time: 02/20/22  3:33 AM  Result Value Ref Range   Glucose-Capillary 248 (H) 70 - 99 mg/dL    Comment: Glucose reference range applies only to samples taken after fasting for at least 8 hours.  Triglycerides     Status: Abnormal   Collection Time: 02/20/22  4:18 AM  Result Value Ref Range   Triglycerides  438 (H) <150 mg/dL    Comment: Performed at [facility], [address] [place], [place] [address]  CBC     Status: Abnormal   Collection Time: 02/20/22  4:18 AM  Result Value Ref Range   WBC 18.7 (H) 4.0 - 10.5 K/uL   RBC 3.80 (L) 3.87 - 5.11 MIL/uL   Hemoglobin 11.6 (L) 12.0 - 15.0 g/dL   HCT 66.4 (L) 40.3 - 47.4 %  MCV 90.5 80.0 - 100.0 fL   MCH 30.5 26.0 - 34.0 pg   MCHC 33.7 30.0 - 36.0 g/dL   RDW 40.9 (H) 81.1 - 91.4 %   Platelets 118 (L) 150 - 400 K/uL    Comment: Immature Platelet Fraction may be clinically indicated, consider ordering this additional test [id number] CONSISTENT WITH PREVIOUS RESULT REPEATED TO VERIFY    nRBC 0.0 0.0 - 0.2 %    Comment: Performed at [facility], [address] [place], [place] [address]  Basic metabolic panel     Status: Abnormal   Collection Time: 02/20/22  4:18 AM  Result Value Ref Range   Sodium 148 (H) 135 - 145 mmol/L   Potassium 3.5 3.5 - 5.1 mmol/L   Chloride 128 (H) 98 - 111 mmol/L   CO2 16 (L) 22 - 32 mmol/L   Glucose, Bld 263 (H) 70 - 99 mg/dL    Comment: Glucose reference range applies only to samples taken after fasting for at least 8 hours.   BUN 33 (H) 6 - 20 mg/dL   Creatinine, Ser 7.84 (H) 0.44 - 1.00 mg/dL   Calcium 7.4 (L) 8.9 - 10.3 mg/dL   GFR, Estimated 33 (L) >60 mL/min    Comment: (NOTE) Calculated using the CKD-EPI Creatinine Equation (2021)    Anion gap 4 (L) 5 - 15    Comment: Performed at [facility], [address], [place], [place] [address]  Magnesium     Status: None   Collection Time: 02/20/22  4:18 AM  Result Value Ref Range   Magnesium 2.2 1.7 - 2.4 mg/dL    Comment: Performed at [facility], [address], [place], [place] [address]  Phosphorus     Status: None   Collection Time: 02/20/22  4:18 AM  Result Value Ref Range   Phosphorus 2.7 2.5 - 4.6 mg/dL    Comment: Performed at [facility],  [address] [place], [place] [address]  Glucose, capillary     Status: Abnormal   Collection Time: 02/20/22  7:47 AM  Result Value Ref Range   Glucose-Capillary 211 (H) 70 - 99 mg/dL    Comment: Glucose reference range applies only to samples taken after fasting for at least 8 hours.  Sodium     Status: Abnormal   Collection Time: 02/20/22  8:18 AM  Result Value Ref Range   Sodium 152 (H) 135 - 145 mmol/L    Comment: Performed at [facility], [address], [place], [place] [address]  Vancomycin, trough     Status: Abnormal   Collection Time: 02/20/22  8:18 AM  Result Value Ref Range   Vancomycin Tr 22 (HH) 15 - 20 ug/mL    Comment: CRITICAL RESULT CALLED TO, READ BACK BY AND VERIFIED WITH: [facility] RN @ [phone] ON [id number] BY MAHMOUD,S Performed at [facility], [address], [place], [place] [address]   Glucose, capillary     Status: Abnormal   Collection Time: 02/20/22 11:36 AM  Result Value Ref Range   Glucose-Capillary 239 (H) 70 - 99 mg/dL    Comment: Glucose reference range applies only to samples taken after fasting for at least 8 hours.  Sodium     Status: Abnormal   Collection Time: 02/20/22  3:17 PM  Result Value Ref Range   Sodium 155 (H) 135 - 145 mmol/L    Comment: Performed at [facility], [address] [place], [place] [address]  Glucose, capillary     Status: Abnormal   Collection Time: 02/20/22  3:46 PM  Result Value Ref Range   Glucose-Capillary 216 (H) 70 - 99 mg/dL    Comment: Glucose reference range applies only to samples taken after fasting for at least 8 hours.  Magnesium     Status: None   Collection Time: 02/20/22  4:02 PM  Result Value Ref Range   Magnesium 2.1 1.7 - 2.4 mg/dL    Comment: Performed at [facility], [address] [place], [place] [address]  Phosphorus     Status: None   Collection Time: 02/20/22  4:02 PM  Result Value Ref Range   Phosphorus 2.9  2.5 - 4.6 mg/dL    Comment: Performed at [facility], [address] [place] [address], [place], [place] [address]  Glucose, capillary     Status: Abnormal   Collection Time: 02/20/22  7:45 PM  Result Value Ref Range   Glucose-Capillary 207 (H) 70 - 99 mg/dL    Comment: Glucose reference range applies only to samples taken after fasting for at least 8 hours.  Glucose, capillary     Status: Abnormal   Collection Time: 02/20/22 11:16 PM  Result Value Ref Range   Glucose-Capillary 244 (H) 70 - 99 mg/dL    Comment: Glucose reference range applies only to samples taken after fasting for at least 8 hours.  Sodium     Status: Abnormal   Collection Time: 02/21/22 12:00 AM  Result Value Ref Range   Sodium 161 (HH) 135 - 145 mmol/L    Comment: CRITICAL RESULT CALLED TO, READ BACK BY AND VERIFIED WITH:  Annett Fabian RN 02/21/22 @ 0041 VS Performed at [facility], [address] [place], [place] [address]   Glucose, capillary     Status: Abnormal   Collection Time: 02/21/22  3:14 AM  Result Value Ref Range   Glucose-Capillary 258 (H) 70 - 99 mg/dL    Comment: Glucose reference range applies only to samples taken after fasting for at least 8 hours.  CBC     Status: Abnormal   Collection Time: 02/21/22  4:05 AM  Result Value Ref Range   WBC 19.2 (H) 4.0 - 10.5 K/uL   RBC 4.28 3.87 - 5.11 MIL/uL   Hemoglobin 12.6 12.0 - 15.0 g/dL   HCT 40.1 02.7 - 25.3 %   MCV 91.8 80.0 - 100.0 fL   MCH 29.4 26.0 - 34.0 pg   MCHC 32.1 30.0 - 36.0 g/dL   RDW 66.4 (H) 40.3 - 47.4 %   Platelets 145 (L) 150 - 400 K/uL   nRBC 0.0 0.0 - 0.2 %    Comment: Performed at [facility], [address] [place], [place] [address]  Basic metabolic panel     Status: Abnormal   Collection Time: 02/21/22  4:05 AM  Result Value Ref Range   Sodium 161 (HH) 135 - 145 mmol/L    Comment: CRITICAL RESULT CALLED TO, READ BACK BY AND VERIFIED WITH:  COURTNEYGRIFFIN RN 02/21/22 @ 0448  VS    Potassium 3.5 3.5 - 5.1 mmol/L   Chloride <130 (H) 98 - 111 mmol/L    Comment:  COURTNEYGRIFFIN RN 02/21/22 @ 0448 VS   CO2 18 (L) 22 - 32 mmol/L   Glucose, Bld 287 (H) 70 - 99 mg/dL    Comment: Glucose reference range applies only to samples taken after fasting  for at least 8 hours.   BUN 35 (H) 6 - 20 mg/dL   Creatinine, Ser 8.29 (H) 0.44 - 1.00 mg/dL   Calcium 8.3 (L) 8.9 - 10.3 mg/dL   GFR, Estimated 39 (L) >60 mL/min    Comment: (NOTE) Calculated using the CKD-EPI Creatinine Equation (2021) Performed at [facility], [address] [place], [place] [address]   Magnesium     Status: None   Collection Time: 02/21/22  4:05 AM  Result Value Ref Range   Magnesium 2.2 1.7 - 2.4 mg/dL    Comment: Performed at [facility], [address] [place], [place] [address]  Phosphorus     Status: None   Collection Time: 02/21/22  4:05 AM  Result Value Ref Range   Phosphorus 3.1 2.5 - 4.6 mg/dL    Comment: Performed at [facility], [address], [place], [place] [address]  Glucose, capillary     Status: Abnormal   Collection Time: 02/21/22  7:47 AM  Result Value Ref Range   Glucose-Capillary 203 (H) 70 - 99 mg/dL    Comment: Glucose reference range applies only to samples taken after fasting for at least 8 hours.   Comment 1 Notify RN    Comment 2 Document in Chart   Osmolality, urine     Status: Abnormal   Collection Time: 02/21/22  8:05 AM  Result Value Ref Range   Osmolality, Ur 209 (L) 300 - 900 mOsm/kg    Comment: Performed at [facility], [address], [place], [place] [address]  Sodium     Status: Abnormal   Collection Time: 02/21/22  8:09 AM  Result Value Ref Range   Sodium 164 (HH) 135 - 145 mmol/L    Comment: CRITICAL RESULT CALLED TO, READ BACK BY AND VERIFIED WITH: [facility] RN @ [phone] ON [id number] BY MAHMOUD,S Performed at [facility], [address], [place], [place] [address]    Sodium     Status: Abnormal   Collection Time: 02/21/22  9:59 AM  Result Value Ref Range   Sodium 161 (HH) 135 - 145 mmol/L    Comment: CRITICAL RESULT CALLED TO, READ BACK BY AND VERIFIED WITH: Coralie Keens. RN AT [phone] 02/21/2022 ON BY MECIAL J. Performed at [facility], [address] [place], [place] [address]     DG Abd 1 View  Result Date: 02/19/2022 CLINICAL DATA:  Orogastric tube placement EXAM: ABDOMEN - 1 VIEW COMPARISON:  02/18/2022 FINDINGS: Limited radiograph of the lower chest and upper abdomen was obtained for the purposes of enteric tube localization. Enteric tube is seen coursing below the diaphragm with distal tip and side port terminating within the expected location of the gastric body. IMPRESSION: Enteric tube tip and side port positioned within the gastric body. Electronically Signed   By: Duanne Guess D.O.   On: 02/19/2022 14:32   EEG adult  Result Date: 02/19/2022 Charlsie Quest, MD     02/20/2022  8:00 AM Patient Name: Gabrielle Vincent MRN: [id number] Epilepsy Attending: Charlsie Quest Referring Physician/Provider: Chilton Greathouse, MD Date: 02/19/2022 Duration: 23.25 mins Patient history: 56yo female with altered mental status.  EEG to evaluate for seizure. Level of alertness:  lethargic AEDs during EEG study: Propofol Technical aspects: This EEG study was done with scalp electrodes positioned according to the 10-20 International system of electrode placement. Electrical activity was acquired at a sampling rate of 500Hz  and reviewed with a  high frequency filter of 70Hz  and a low frequency filter of 1Hz . EEG data were recorded continuously and digitally stored. Description: EEG showed continuous generalized 3-5Hz  theta-delta slowing.  Hyperventilation and photic stimulation were not performed.    ABNORMALITY - Continuous slow, generalized  IMPRESSION: This study is suggestive of moderate to severe diffuse encephalopathy, nonspecific etiology. No seizures  or epileptiform discharges were seen throughout the recording. Gabrielle Vincent    Assessment/Plan **Hypernatremia:  with polyuria markedly increased yesterday concern for central DI related to meningitis.  CT head now.  DDAVP BID changed to QID.  Continue same free water rates for now and follow q2h sodiums - after DDAVP I expect Uosm will increase and hypernatremia will start to improve.  Recheck urine Osm 4pm and if not ^d would increase dose of DDAVP.  Goal will be serum sodium 154 tomorrow AM (10-23mEq/24h).    **E coli sepsis with UTI and meningitis:  on ceftriaxone.    **AKI:  Cr 3.3 on admission; CT renal stone neg obstruction 5/3.  UA consistent with UTI on admission.  Cr improving to 1.5 today.  Expect was ATN in setting of sepsis.    **VDRF per PCCM  Will follow - call with concerns.   Tyler Pita 02/21/2022, 11:41 AM

## 2022-02-22 DIAGNOSIS — A419 Sepsis, unspecified organism: Secondary | ICD-10-CM | POA: Diagnosis not present

## 2022-02-22 DIAGNOSIS — N390 Urinary tract infection, site not specified: Secondary | ICD-10-CM | POA: Diagnosis not present

## 2022-02-22 LAB — CBC
HCT: 36.9 % (ref 36.0–46.0)
Hemoglobin: 11.3 g/dL — ABNORMAL LOW (ref 12.0–15.0)
MCH: 28.8 pg (ref 26.0–34.0)
MCHC: 30.6 g/dL (ref 30.0–36.0)
MCV: 93.9 fL (ref 80.0–100.0)
Platelets: 174 10*3/uL (ref 150–400)
RBC: 3.93 MIL/uL (ref 3.87–5.11)
RDW: 18.2 % — ABNORMAL HIGH (ref 11.5–15.5)
WBC: 15.9 10*3/uL — ABNORMAL HIGH (ref 4.0–10.5)
nRBC: 0 % (ref 0.0–0.2)

## 2022-02-22 LAB — SODIUM
Sodium: 159 mmol/L — ABNORMAL HIGH (ref 135–145)
Sodium: 160 mmol/L — ABNORMAL HIGH (ref 135–145)
Sodium: 159 mmol/L — ABNORMAL HIGH (ref 135–145)
Sodium: 159 mmol/L — ABNORMAL HIGH (ref 135–145)
Sodium: 154 mmol/L — ABNORMAL HIGH (ref 135–145)
Sodium: 153 mmol/L — ABNORMAL HIGH (ref 135–145)
Sodium: 152 mmol/L — ABNORMAL HIGH (ref 135–145)

## 2022-02-22 LAB — BASIC METABOLIC PANEL
BUN: 39 mg/dL — ABNORMAL HIGH (ref 6–20)
CO2: 19 mmol/L — ABNORMAL LOW (ref 22–32)
Calcium: 8.2 mg/dL — ABNORMAL LOW (ref 8.9–10.3)
Chloride: >130 mmol/L (ref 98–111)
Creatinine, Ser: 1.46 mg/dL — ABNORMAL HIGH (ref 0.44–1.00)
GFR, Estimated: 42 mL/min — ABNORMAL LOW (ref >60)
Glucose, Bld: 151 mg/dL — ABNORMAL HIGH (ref 70–99)
Potassium: 3.7 mmol/L (ref 3.5–5.1)
Sodium: 158 mmol/L — ABNORMAL HIGH (ref 135–145)

## 2022-02-22 LAB — OSMOLALITY, URINE
Osmolality, Ur: 399 mOsm/kg (ref 300–900)
Osmolality, Ur: 408 mOsm/kg (ref 300–900)
Osmolality, Ur: 418 mOsm/kg (ref 300–900)
Osmolality, Ur: 419 mOsm/kg (ref 300–900)

## 2022-02-22 LAB — GLUCOSE, CAPILLARY
Glucose-Capillary: 137 mg/dL — ABNORMAL HIGH (ref 70–99)
Glucose-Capillary: 138 mg/dL — ABNORMAL HIGH (ref 70–99)
Glucose-Capillary: 181 mg/dL — ABNORMAL HIGH (ref 70–99)
Glucose-Capillary: 187 mg/dL — ABNORMAL HIGH (ref 70–99)
Glucose-Capillary: 142 mg/dL — ABNORMAL HIGH (ref 70–99)

## 2022-02-22 LAB — HEPATIC FUNCTION PANEL
ALT: 22 U/L (ref 0–44)
AST: 19 U/L (ref 15–41)
Albumin: 1.8 g/dL — ABNORMAL LOW (ref 3.5–5.0)
Alkaline Phosphatase: 105 U/L (ref 38–126)
Bilirubin, Direct: 0.1 mg/dL (ref 0.0–0.2)
Indirect Bilirubin: 0.3 mg/dL (ref 0.3–0.9)
Total Bilirubin: 0.4 mg/dL (ref 0.3–1.2)
Total Protein: 6.6 g/dL (ref 6.5–8.1)

## 2022-02-22 LAB — PHOSPHORUS: Phosphorus: 4.2 mg/dL (ref 2.5–4.6)

## 2022-02-22 LAB — MAGNESIUM: Magnesium: 2.3 mg/dL (ref 1.7–2.4)

## 2022-02-22 MED ORDER — DESMOPRESSIN ACETATE 4 MCG/ML IJ SOLN
2.0000 ug | Freq: Four times a day (QID) | INTRAMUSCULAR | Status: DC
Start: 2022-02-22 — End: 2022-02-23
  Administered 2022-02-22 (×5): 2 ug via INTRAVENOUS
  Filled 2022-02-22 (×5): qty 1

## 2022-02-22 MED ORDER — LIP MEDEX EX OINT: TOPICAL_OINTMENT | CUTANEOUS | Status: DC | PRN

## 2022-02-22 NOTE — Progress Notes (Signed)
?   ? ?[facility] ? ?Date of Admission:  02/14/2022    ?       ?Reason for visit: Follow up on UTI/Meningitis ? ?Current antibiotics: ?Ceftriaxone ? ?ASSESSMENT:   ? ?56 y.o. female admitted with: ? ?E coli UTI and meningitis: Patient presented to the hospital 5/3 with severe back pain, dysuria, and urinary retention.  MRI lumbar spine was obtained without contrast in the setting of AKI which was notable for diffuse thecal sac narrowing throughout most of the lumbar spine.  This was felt to be secondary to epidural lipomatosis.  Her antibiotics were adjusted based on urine cultures growing a sensitive E coli.  Her blood cultures were negative.  She then became more encephalopathic and agitated.  MRI was obtained due to concern for acute stroke noted on motion degraded CT head.  This raised the concern for meningitis.  LP was performed which was floridly consistent with bacterial meningitis and cultures grew the same E coli.  She remains intubated and sedated in the ICU. ?Acute kidney injury:  Creatinine was 3.3 at admission and improved down to 1.5 today.  UA on admission was c/w UTI in the setting of symptoms that were consistent as well.  CT renal stone study was negative for obstruction. ?Diabetes insipidus and hypernatremia: Being managed by nephrology and CCM. ? Acute low back pain: She was given steroids and pain medication on admission.  She was evaluated by NSGY whom recommended supportive care.  Epidural steroid injections were planned to alleviate her symptoms but never completed due to her deterioration clinically.  ?Acute hypoxemic respiratory failure: Requiring mechanical ventilation. ? ?RECOMMENDATIONS:   ? ?I have concern that she had a UTI that led to seeding of the lumbar spine and the epidural lipomatosis on MRI w/o contrast may have been an epidural abscess given initial presentation ?Recommend MRI lumbar spine with contrast to further assess as this may require surgical  management ?Continue ceftriaxone 2gm q12h ?Will follow ? ? ?Principal Problem: ?  Sepsis secondary to UTI [facility]) ?Active Problems: ?  Hyponatremia ?  Hypokalemia ?  AKI (acute kidney injury) ([place]) ?  Abnormal LFTs ?  Class II obesity ?  Hepatic steatosis ?  Cholelithiasis ?  Esophageal thickening ?  Acute low back pain ?  Encephalopathy acute ?  Acute respiratory failure ([place]) ?  Radiculopathy of lumbar region ?  E. coli UTI ?  Meningitis ?  Bacterial meningitis ?  Diabetes insipidus ([place]) ? ? ? ?MEDICATIONS:   ? ?Scheduled Meds: ? chlorhexidine gluconate (MEDLINE KIT)  15 mL Mouth Rinse BID  ? Chlorhexidine Gluconate Cloth  6 each Topical Daily  ? desmopressin  2 mcg Intravenous QID  ? docusate  100 mg Per Tube BID  ? enoxaparin (LOVENOX) injection  40 mg Subcutaneous Daily  ? free water  400 mL Per Tube Q4H  ? insulin aspart  0-15 Units Subcutaneous Q4H  ? insulin aspart  2 Units Subcutaneous Q4H  ? mouth rinse  15 mL Mouth Rinse 10 times per day  ? metoprolol tartrate  10 mg Intravenous Q6H  ? pantoprazole (PROTONIX) IV  40 mg Intravenous Q24H  ? polyethylene glycol  17 g Per Tube Daily  ? ?Continuous Infusions: ? cefTRIAXone (ROCEPHIN)  IV Stopped (02/22/22 1103)  ? dexmedetomidine (PRECEDEX) IV infusion 0.5 mcg/kg/hr (02/22/22 1418)  ? dextrose 125 mL/hr at 02/22/22 1520  ? fentaNYL infusion INTRAVENOUS 125 mcg/hr (02/22/22 1215)  ? ?PRN Meds:.acetaminophen (TYLENOL) oral liquid  160 mg/5 mL, fentaNYL, fentaNYL (SUBLIMAZE) injection, hydrALAZINE, lip balm, midazolam, ondansetron **OR** ondansetron (ZOFRAN) IV, oxyCODONE ? ? ? ?[place] [place] ?[place] [facility] ?[facility] ?02/22/2022, 5:15 PM ? ? ? ?

## 2022-02-22 NOTE — Progress Notes (Addendum)
Spoke with patient's sister, Crystal, this evening. Crystal wanted to know the patient's GFR and was unsure whether it would be beneficial for the patient to have an MRI. This nurse informed Crystal of Dr. Alcario Drought recommendation (per his note from 02/22/22) to have the MRI with contrast. Crystal requested the patient not have the MRI this evening until after she spoke with a provider in the morning for clarification of risks/benefits. Will inform the oncoming nursing staff of this request.  ?

## 2022-02-22 NOTE — Progress Notes (Signed)
?[facility] ?Progress Note  ? ?Subjective:   Remains intubated, sedated.  UOP down from 7L + to 3.4L after 2 doses DDAVP yesterday.  Serum sodium down from 164 to 159 in past 24h.  ? ?Objective ?Vitals:  ? 02/22/22 0600 02/22/22 0700 02/22/22 0800 02/22/22 0900  ?BP: (!) 106/47 (!) 106/42 (!) 106/51 120/64  ?Pulse: 68 69 69 76  ?Resp: (!) 24 (!) 24 (!) 24 18  ?Temp: 99.1 ?F (37.3 ?C) 99.1 ?F (37.3 ?C) 99.3 ?F (37.4 ?C) 99 ?F (37.2 ?C)  ?TempSrc: Esophageal  Esophageal   ?SpO2: 97% 98% 98% 98%  ?Weight:      ?Height:      ? ?Physical Exam ?General: intubated, sedate ?Heart: RRR ?Lungs: coarse ant ?Abdomen: soft ?Extremities: trace edema ?GU: foley with yellow urine - bag just emptied ? ?Additional Objective ?Labs: ?Basic Metabolic Panel: ?Recent Labs  ?Lab 02/20/22 ?6568 02/20/22 ?0818 02/20/22 ?1602 02/21/22 ?0000 02/21/22 ?0405 02/21/22 ?1275 02/22/22 ?1700 02/22/22 ?1749 02/22/22 ?0819  ?NA 148*   < >  --    < > 161*   < > 158* 159* 159*  ?K 3.5  --   --   --  3.5  --  3.7  --   --   ?CL 128*  --   --   --  <130*  --  >130*  --   --   ?CO2 16*  --   --   --  18*  --  19*  --   --   ?GLUCOSE 263*  --   --   --  287*  --  151*  --   --   ?BUN 33*  --   --   --  35*  --  39*  --   --   ?CREATININE 1.79*  --   --   --  1.54*  --  1.46*  --   --   ?CALCIUM 7.4*  --   --   --  8.3*  --  8.2*  --   --   ?PHOS 2.7  --  2.9  --  3.1  --  4.2  --   --   ? < > = values in this interval not displayed.  ? ?Liver Function Tests: ?Recent Labs  ?Lab 02/18/22 ?1829 02/19/22 ?4496 02/22/22 ?0429  ?AST _0 ?ALT 42 35 22  ?ALKPHOS 147* 138* 105  ?BILITOT 0.9 1.0 0.4  ?PROT 5.9* 5.7* 6.6  ?ALBUMIN 1.8* 1.7* 1.8*  ? ?No results for input(s): LIPASE, AMYLASE in the last 168 hours. ?CBC: ?Recent Labs  ?Lab 02/16/22 ?7591 02/17/22 ?0430 02/18/22 ?6384 02/19/22 ?6659 02/20/22 ?9357 02/21/22 ?0405 02/22/22 ?0177  ?WBC 39.2* 34.3* 24.5* 20.2* 18.7* 19.2* 15.9*  ?NEUTROABS 31.5* 25.3* 18.6*  --   --   --   --   ?HGB  15.0 14.0 11.9* 11.8* 11.6* 12.6 11.3*  ?HCT 45.4 41.4 35.2* 35.7* 34.4* 39.3 36.9  ?MCV 87.5 88.3 88.9 89.5 90.5 91.8 93.9  ?PLT 109* 107* 96* 106* 118* 145* 174  ? ?Blood Culture ?   ?Component Value Date/Time  ? SDES  02/18/2022 1345  ?  CSF ?Performed at [facility], [place] [address], [place], [place] [address] ?  ? SPECREQUEST  02/18/2022 1345  ?  NONE ?Performed at [facility], [place] [address] [place], [place] [address] ?  ? CULT  02/18/2022 1345  ?  MODERATE ESCHERICHIA COLI ?CRITICAL RESULT CALLED  TO, READ BACK BY AND VERIFIED WITH: RN M.SPENS AT 1254 ON 02/19/2022 BY T.SAAD. ?Performed at [facility], [address] [place], [place] [address] ?  ? REPTSTATUS 02/20/2022 FINAL 02/18/2022 1345  ? ? ?Cardiac Enzymes: ?No results for input(s): CKTOTAL, CKMB, CKMBINDEX, TROPONINI in the last 168 hours. ?CBG: ?Recent Labs  ?Lab 02/21/22 ?1557 02/21/22 ?2038 02/21/22 ?2343 02/22/22 ?0407 02/22/22 ?0758  ?GLUCAP 184* 150* 151* 137* 138*  ? ?Iron Studies: No results for input(s): IRON, TIBC, TRANSFERRIN, FERRITIN in the last 72 hours. ?@[email or web address]@ ?Studies/Results: ?CT HEAD WO CONTRAST (5MM) ? ?Result Date: 02/21/2022 ?CLINICAL DATA:  Hydrocephalus, shunted, follow-up. Rule out hydrocephalus. EXAM: CT HEAD WITHOUT CONTRAST TECHNIQUE: Contiguous axial images were obtained from the base of the skull through the vertex without intravenous contrast. RADIATION DOSE REDUCTION: This exam was performed according to the departmental dose-optimization program which includes automated exposure control, adjustment of the mA and/or kV according to patient size and/or use of iterative reconstruction technique. COMPARISON:  Brain MRI 02/16/2022. Head CT 02/15/2022. FINDINGS: Motion degraded exam. Brain: Mild generalized parenchymal atrophy. 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 There is no acute intracranial hemorrhage. No  acute demarcated cortical infarct. No extra-axial fluid collection. No evidence of an intracranial mass. No evidence of hydrocephalus. No midline shift. Vascular: No hyperdense vessel.  Atherosclerotic calcifications. Skull: Normal. Negative for fracture or focal lesion. Sinuses/Orbits: Within the limitations of motion degradation, there is no appreciable acute orbital abnormality. Fluid level and frothy secretions within the left sphenoid sinus. Frothy secretions within the right sphenoid sinus. Mucosal thickening and/or fluid within the left ethmoid air cells. IMPRESSION: Motion degraded exam. No evidence of acute intracranial abnormality. Specifically, there is no hydrocephalus. Please refer to the recent prior brain MRI 02/16/2022 for a description of small-volume debris layering within the lateral ventricle occipital horns at that time. 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 Mild generalized parenchymal atrophy. Paranasal sinus disease at the imaged levels, as described. Electronically Signed   By: Kyle  Golden D.O.   On: 02/21/2022 11:48   ?Medications: ? cefTRIAXone (ROCEPHIN)  IV Stopped (02/21/22 2331)  ? dexmedetomidine (PRECEDEX) IV infusion 0.4 mcg/kg/hr (02/22/22 1016)  ? dextrose 125 mL/hr at 02/22/22 0900  ? fentaNYL infusion INTRAVENOUS 88 mcg/hr (02/22/22 1018)  ? ? chlorhexidine gluconate (MEDLINE KIT)  15 mL Mouth Rinse BID  ? Chlorhexidine Gluconate Cloth  6 each Topical Daily  ? desmopressin  2 mcg Intravenous QID  ? docusate  100 mg Per Tube BID  ? enoxaparin (LOVENOX) injection  40 mg Subcutaneous Daily  ? free water  400 mL Per Tube Q4H  ? insulin aspart  0-15 Units Subcutaneous Q4H  ? insulin aspart  2 Units Subcutaneous Q4H  ? mouth rinse  15 mL Mouth Rinse 10 times per day  ? metoprolol tartrate  10 mg Intravenous Q6H  ? pantoprazole (PROTONIX) IV  40 mg Intravenous Q24H  ? polyethylene glycol  17 g Per Tube Daily   ? ? ?Assessment/Plan: Gabrielle Vincent is an 56 y.o. female with obesity and allergies currently admitted for sepsis secondary to bacterial meningitis and nephrology is consulted for evaluation and management of hypernatremia.  ? 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  U osm 209 and increased to 419 with DDAVP 1mcg.  Further increased DDAVP to 2mcg q6h for now.  On FWF 400 q4h + D5W at 75/hr now.  Continue serial   sodiums.  Down from 164 to 159 over past 24hrs, reasonable to correct ~47mq/24h.  Continue q4h sodium checks and titrations of free water.  When she clinically improves would start to titrate down the DDAVP daily 2 QID to 1 QID to 0.5 QID to BID then off so she doesn't have a huge free water diuresis unexpectedly in case still with central DI.  ?  ?**E coli sepsis with UTI and meningitis:  on ceftriaxone.   ?  ?**AKI:  Cr 3.3 on admission; CT renal stone neg obstruction 5/3.  UA consistent with UTI on admission.  Cr improving and stable at 1.5 today.  Expect was ATN in setting of sepsis.   ?  ?**VDRF per PCCM ?  ?I don't have much else to add -- will defer to PCCM to titrate water based on serial sodiums.   Call back anytime.  ? ?LJannifer HickMD ?02/22/2022, 10:26 AM  ?[facility] ?Pager: [phone] ? ?

## 2022-02-22 NOTE — Progress Notes (Addendum)
? ?NAME:  Gabrielle Vincent, MRN:  [id number], DOB:  1966/07/06, LOS: 8 ?ADMISSION DATE:  02/14/2022, CONSULTATION DATE:  5/5 ?REFERRING MD:  Rai, CHIEF COMPLAINT:  acute encephalopathy and sepsis   ? ?History of Present Illness:  ?56 year old female who was admitted on 5/3 with chief complaint of severe low back pain radiating down her left leg.  Also had associated hematuria and dysuria.  She had no weakness although low did say pain impacted her ability to walk.  Also noted poor p.o. intake, decreased urination as well as urinary retention, hematuria, dysuria, and some nausea.  ? ?CT scan was negative for obstructive uropathy did show fatty liver infiltrates, there is cholelithiasis but no acute cholecystitis there is mild circumferential thickening of the distal esophagus global enlargement of the uterus with thickened appearance of the endometrial stripe with radiology recommending nonemergent pelvic ultrasound.  Diagnostic evaluation notable for new AKI with serum creatinine 3.64, acute transaminitis leukocytosis, and hyponatremia of 127 she was admitted for further evaluation, cultures were sent, and she was started on IV ceftriaxone. ? ?On 5/3 patient was noted to be more confused, speech was slurred , Working diagnosis was #1 urinary tract infection with resultant sepsis and also low back pain for which neurosurgery was consulted as MRI finding showed acute herniated L3-L4 disc with left lumbar radiculopathy.  It was felt that pain control via lumbar injection may help in that this could be treated conservatively. ? ?On 5/4 patient becoming intermittently agitated then lethargic, because of this a CT of head was obtained this was severely limited due to motion degraded meant but there was concern about hypodense area in the right basal ganglia section raising concern for acute infarct.  On 5/5 a rapid response was called the patient was severely agitated, pulled out IVs, pull out Foley catheter ? ?Later that  afternoon mental status continued to worsen.  Neurology consult was obtained in addition to this infectious disease was consulted with urine growing E. coli and narrowed antibiotics to cefazolin stopped vancomycin and recommended supportive care because her mental status continued to decline, she had severe metabolic derangements, and we were unable to provide medical care on the [place] ward she was transferred to the ICU for higher level of care.  On initial arrival she was agitated requiring several nurses to hold her down, tachypneic tachycardic would not follow commands had marked increased work of breathing.  An intraosseous line was placed in the right lower extremity, she was intubated for airway protection to facilitate further MRI imaging, and a right IJ triple-lumen catheter was placed ? ?Pertinent  Medical History  ?Class II obesity, hepatic steatosis, seasonal allergies, varicose veins diverticulosis, HL  ? ?Significant Hospital Events: ?Including procedures, antibiotic start and stop dates in addition to other pertinent events   ?5/3 admitted with back pain and urinary tract infection , Further complicated by AKI hyponatremia and multiple metabolic derangements.   ?CT imaging for renal stones showed no acute abdominal pelvic findings there was no obstructive uropathy there was severe fatty liver disease there was cholelithiasis but no cholecystitis there is colonic diverticulosis but no diverticulitis, mild circumferential thickening of the esophagus globular enlargement of the uterus with radiology recommending nonemergent pelvic ultrasound found to have uterine fibroid abdominal Ultrasound showed fatty liver but was epididymides negative.   ?MRI of the lumbar spine showed small left subarticular disc extrusion with inferior migration at L3-L4 correlating with radiculopathy.  She was started on ceftriaxone cultures were sent ?5/4 increased confusion CT  brain obtained raising concern for possible acute  infarct in the right basal ganglia ?5/5 progressive encephalopathy , Worsening leukocytosis, hypernatremia, hyperchloremia, slowly improving renal function, slowly improving procalcitonin, moved to ICU due to delirium, intubated for airway protection and to facilitate MRI imaging right IJ central line placed due to limited IV access.  Seen by neurology, also seen by infectious disease with ceftriaxone changed to cefazolin ?5/6 antibiotics changed to meningitis coverage given MRI findings of fluid in the ventricles.  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  DDAVP and volume resuscitation started ?5/11 hypernatremia slowly downtrending, continue DDAVP and IV resuscitation per nephrology ? ?Interim History / Subjective:  ?Sedated on ventilator, urine output decreased after initiation of DDAVP ?Appears comfortable on ventilator this a.m. ? ?Objective   ?Blood pressure (!) 106/42, pulse 69, temperature 99.1 ?F (37.3 ?C), resp. rate (!) 24, height '5\' 4"'$  (1.626 m), weight 106.8 kg, SpO2 98 %. ?   ?Vent Mode: PRVC ?FiO2 (%):  [30 %] 30 % ?Set Rate:  [24 bmp] 24 bmp ?Vt Set:  [440 mL] 440 mL ?PEEP:  [5 cmH20] 5 cmH20 ?Pressure Support:  [8 cmH20] 8 cmH20 ?Plateau Pressure:  [15 cmH20-16 cmH20] 15 cmH20  ? ?Intake/Output Summary (Last 24 hours) at 02/22/2022 0827 ?Last data filed at 02/22/2022 0747 ?Gross per 24 hour  ?Intake 2441.05 ml  ?Output 3415 ml  ?Net -973.95 ml  ? ? ?Filed Weights  ? 02/20/22 0500 02/21/22 0500 02/22/22 0455  ?Weight: 101.1 kg 99.8 kg 106.8 kg  ? ?Physical exam ?General: Acute on chronic ill-appearing middle-aged female lying in bed in on mechanical ventilation no acute distress ?HEENT: ETT, MM pink/moist, PERRL,  ?Neuro: Sedated on ventilator ?CV: s1s2 regular rate and rhythm, no murmur,  rubs, or gallops,  ?PULM: Clear to auscultation bilaterally, no increased work of breathing, no added breath sounds ?GI: soft, bowel sounds active in all 4 quadrants, non-tender, non-distended, tolerating TF ?Extremities: warm/dry, no edema  ?Skin: no rashes or lesions ? ? ?Assessment & Plan:  ?Principal Problem: ?  Sepsis secondary to UTI [facility]) ?Active Problems: ?  Hyponatremia ?  Hypokalemia ?  AKI (acute kidney injury) ([place]) ?  Abnormal LFTs ?  Class II obesity ?  Hepatic steatosis ?  Cholelithiasis ?  Esophageal thickening ?  Acute low back pain ?  Encephalopathy acute ?  Acute respiratory failure ([place]) ?  Radiculopathy of lumbar region ?  E. coli UTI ?  Meningitis ?  Bacterial meningitis ?  Diabetes insipidus ([place]) ?Bacterial Meningitis ? ?Acute metabolic encephalopathy  ?Bacterial meningitis ?- Secondary to E.coli UTI  and E.coli  meningitis (s/p LP on 5/7 with purulent appearing CSF with cultures pos for E.coli pan S except for ampicillin/unasyn). MRI neg for CVA. CSF cultures with E.coli ?P: ?Neurology following, appreciate assistance ?Neurosurgery also evaluated during admission with no indicated need of ICP monitoring 5/10 ?Initiation of antiepileptics per neurology ?Maintain neuro protective measures; goal for eurothermia, euglycemia, eunatermia, normoxia, and PCO2 goal of 35-40 ?Nutrition and bowel regiment  ?Seizure precautions  ?Aspirations precautions  ?Continue IV ceftriaxone ?Minimize sedation as able to allow for trending neuro exams ?Daily wake-up assessment ?Low threshold to repeat CT with any neurological change ? 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 2/2 meningitis.  Has had climbing Na and overnight  had 7.1L UOP. Clinically, this fits with a central DI ?-Urine osmolarity dropped as low as 209 on 5/10, after initiation of DDAVP and IV resuscitation urine osmolarity has risen to 418 by morning of 5/11 ?P: ?Nephrology following, appreciate assistance ?Continue DDAVP 4 times daily, given persistent  hypernatremia consider up titration ?Strict intake and output ?Follow sodium levels every 4 hours ?Continue D5W (increased to rate 125/hr) and free water flushes ? ?Acute hypoxic respiratory failure  ?-Secondary

## 2022-02-23 ENCOUNTER — Inpatient Hospital Stay (HOSPITAL_COMMUNITY): Payer: BC Managed Care – PPO

## 2022-02-23 ENCOUNTER — Encounter (HOSPITAL_COMMUNITY): Payer: Self-pay | Admitting: Internal Medicine

## 2022-02-23 DIAGNOSIS — N390 Urinary tract infection, site not specified: Secondary | ICD-10-CM | POA: Diagnosis not present

## 2022-02-23 DIAGNOSIS — N179 Acute kidney failure, unspecified: Secondary | ICD-10-CM | POA: Diagnosis not present

## 2022-02-23 DIAGNOSIS — E232 Diabetes insipidus: Secondary | ICD-10-CM

## 2022-02-23 DIAGNOSIS — G009 Bacterial meningitis, unspecified: Secondary | ICD-10-CM

## 2022-02-23 DIAGNOSIS — B9620 Unspecified Escherichia coli [E. coli] as the cause of diseases classified elsewhere: Secondary | ICD-10-CM

## 2022-02-23 DIAGNOSIS — A419 Sepsis, unspecified organism: Secondary | ICD-10-CM | POA: Diagnosis not present

## 2022-02-23 DIAGNOSIS — G9340 Encephalopathy, unspecified: Secondary | ICD-10-CM | POA: Diagnosis not present

## 2022-02-23 LAB — BASIC METABOLIC PANEL
Anion gap: 6 (ref 5–15)
BUN: 34 mg/dL — ABNORMAL HIGH (ref 6–20)
CO2: 18 mmol/L — ABNORMAL LOW (ref 22–32)
Calcium: 7.9 mg/dL — ABNORMAL LOW (ref 8.9–10.3)
Chloride: 123 mmol/L — ABNORMAL HIGH (ref 98–111)
Creatinine, Ser: 1.26 mg/dL — ABNORMAL HIGH (ref 0.44–1.00)
GFR, Estimated: 50 mL/min — ABNORMAL LOW (ref >60)
Glucose, Bld: 191 mg/dL — ABNORMAL HIGH (ref 70–99)
Potassium: 3.3 mmol/L — ABNORMAL LOW (ref 3.5–5.1)
Sodium: 147 mmol/L — ABNORMAL HIGH (ref 135–145)

## 2022-02-23 LAB — SODIUM
Sodium: 143 mmol/L (ref 135–145)
Sodium: 145 mmol/L (ref 135–145)
Sodium: 145 mmol/L (ref 135–145)
Sodium: 145 mmol/L (ref 135–145)
Sodium: 147 mmol/L — ABNORMAL HIGH (ref 135–145)

## 2022-02-23 LAB — OSMOLALITY, URINE: Osmolality, Ur: 420 mOsm/kg (ref 300–900)

## 2022-02-23 LAB — GLUCOSE, CAPILLARY
Glucose-Capillary: 123 mg/dL — ABNORMAL HIGH (ref 70–99)
Glucose-Capillary: 129 mg/dL — ABNORMAL HIGH (ref 70–99)
Glucose-Capillary: 140 mg/dL — ABNORMAL HIGH (ref 70–99)
Glucose-Capillary: 148 mg/dL — ABNORMAL HIGH (ref 70–99)
Glucose-Capillary: 154 mg/dL — ABNORMAL HIGH (ref 70–99)
Glucose-Capillary: 154 mg/dL — ABNORMAL HIGH (ref 70–99)

## 2022-02-23 LAB — CBC
HCT: 35.6 % — ABNORMAL LOW (ref 36.0–46.0)
Hemoglobin: 11 g/dL — ABNORMAL LOW (ref 12.0–15.0)
MCH: 28.8 pg (ref 26.0–34.0)
MCHC: 30.9 g/dL (ref 30.0–36.0)
MCV: 93.2 fL (ref 80.0–100.0)
Platelets: 181 10*3/uL (ref 150–400)
RBC: 3.82 MIL/uL — ABNORMAL LOW (ref 3.87–5.11)
RDW: 17.6 % — ABNORMAL HIGH (ref 11.5–15.5)
WBC: 15.5 10*3/uL — ABNORMAL HIGH (ref 4.0–10.5)
nRBC: 0 % (ref 0.0–0.2)

## 2022-02-23 LAB — LACTIC ACID, PLASMA: Lactic Acid, Venous: 1 mmol/L (ref 0.5–1.9)

## 2022-02-23 IMAGING — MR MR CERVICAL SPINE WO/W CM
6 of 8 series · 32 of 48 positions shown · IV contrast (gadavist)
Comparison: Comparison made with MRIs of the thoracic and lumbar
spine performed on the same day.

CLINICAL DATA: Initial evaluation for epidural abscess.

EXAM:
MRI CERVICAL SPINE WITHOUT AND WITH CONTRAST
TECHNIQUE: Multiplanar and multiecho pulse sequences of the cervical spine, to
include the craniocervical junction and cervicothoracic junction,
were obtained without and with intravenous contrast.
CONTRAST:  10mL GADAVIST GADOBUTROL 1 MMOL/ML IV SOLN

[Series 9: T1 · sagittal · 3.0mm · 0.69mm/px · 4 of 15 slices shown (1 of 2)]
[im 1/15]
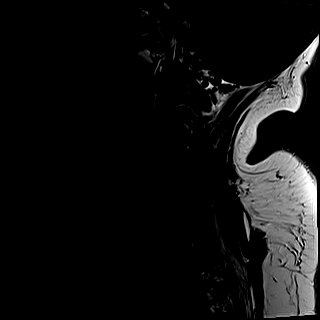
[im 5/15]
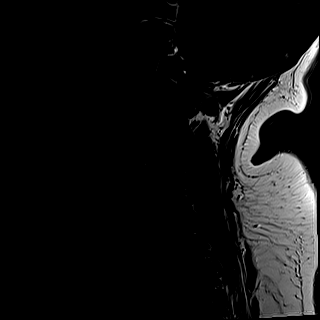
[im 10/15]
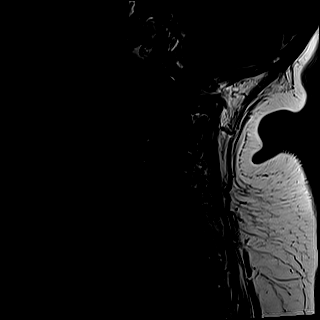
[im 15/15]
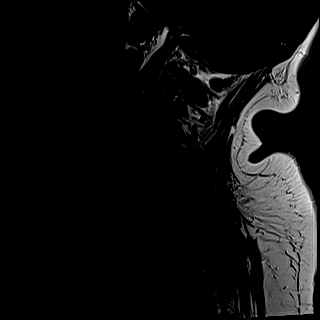

[Series 10: STIR · sagittal · 3.0mm · 0.86mm/px · 4 of 15 slices shown]
[im 1/15]
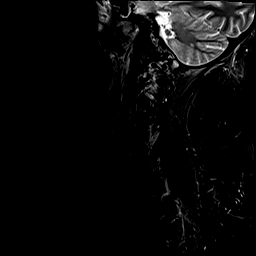
[im 5/15]
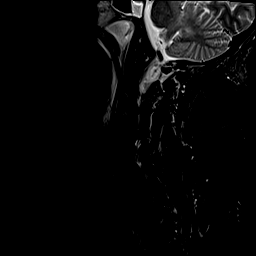
[im 10/15]
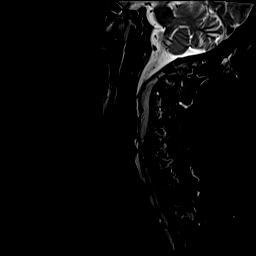
[im 15/15]
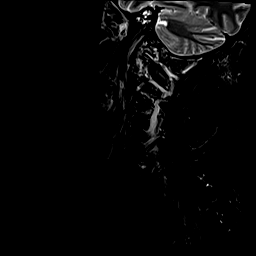

[Series 11: T2 · axial · 3.0mm · 0.70mm/px · z∈[-90,-0]mm · 8 of 27 slices shown (1 of 2)]
[im 1/27]
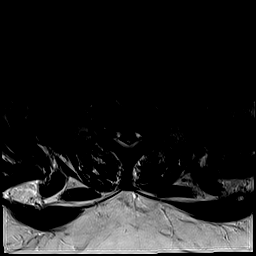
[im 4/27]
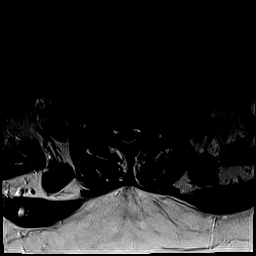
[im 8/27]
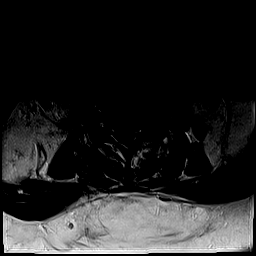
[im 12/27]
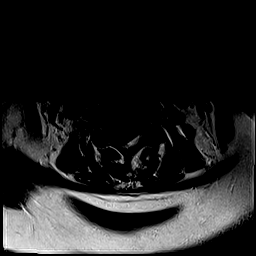
[im 15/27]
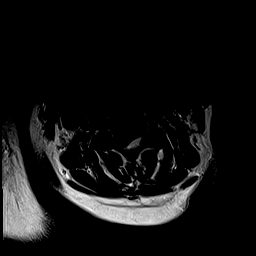
[im 19/27]
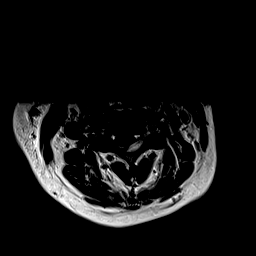
[im 23/27]
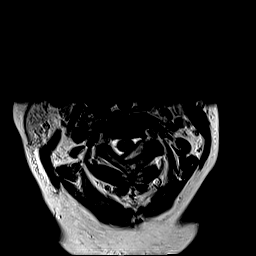
[im 27/27]
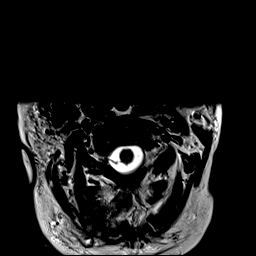

[Series 13: T1 · axial · 3.0mm · 0.35mm/px · z∈[-90,-0]mm · 8 of 27 slices shown (2 of 2)]
[im 1/27]
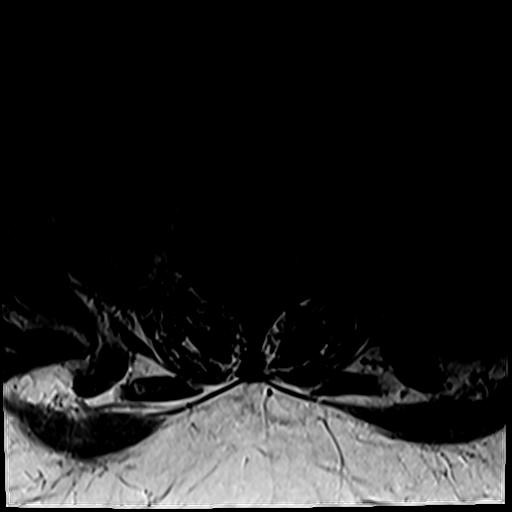
[im 4/27]
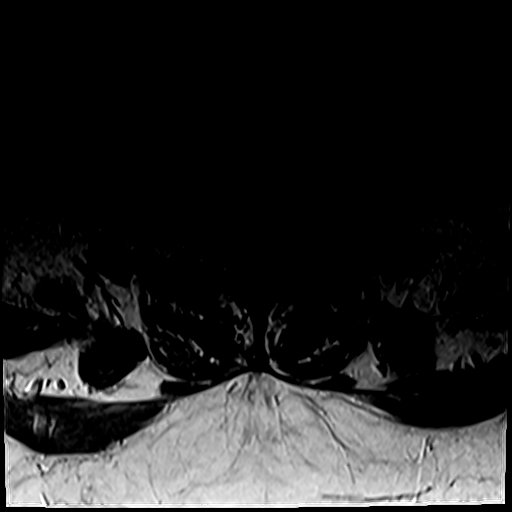
[im 8/27]
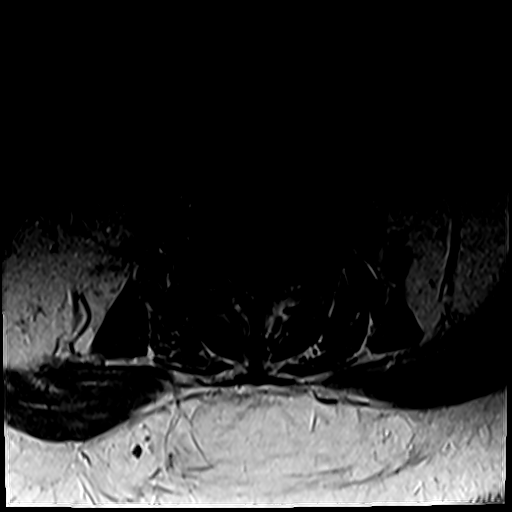
[im 12/27]
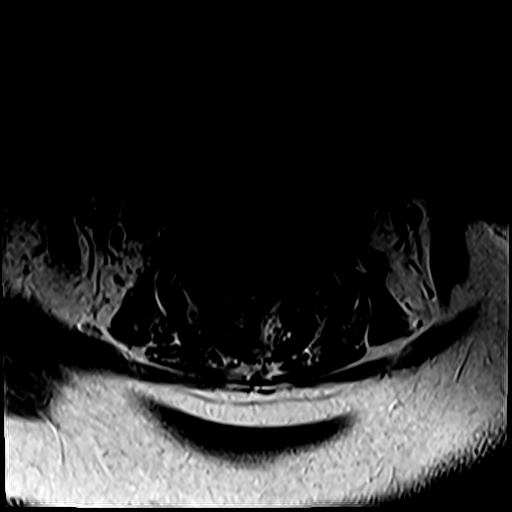
[im 15/27]
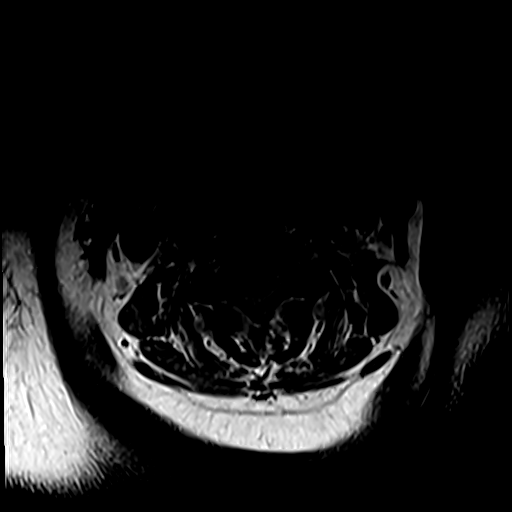
[im 19/27]
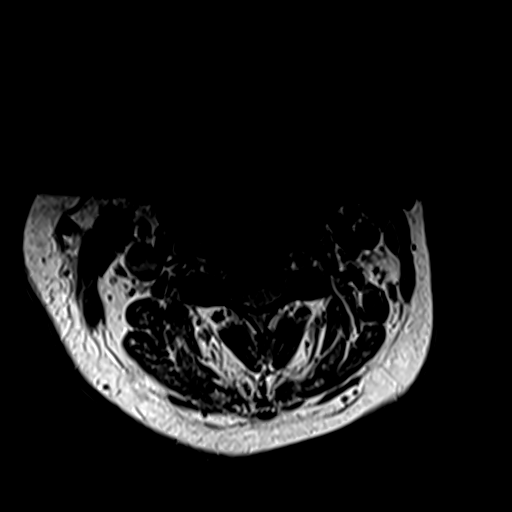
[im 23/27]
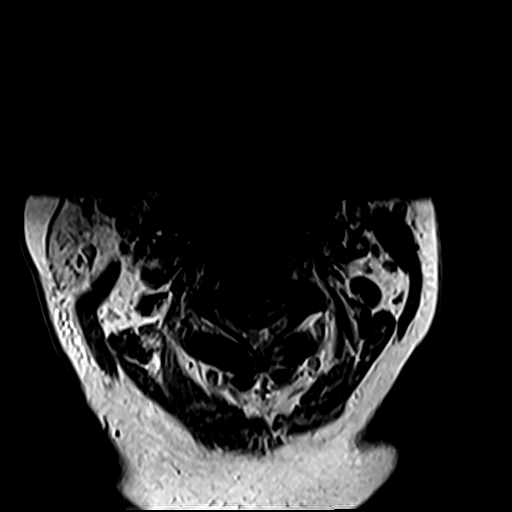
[im 27/27]
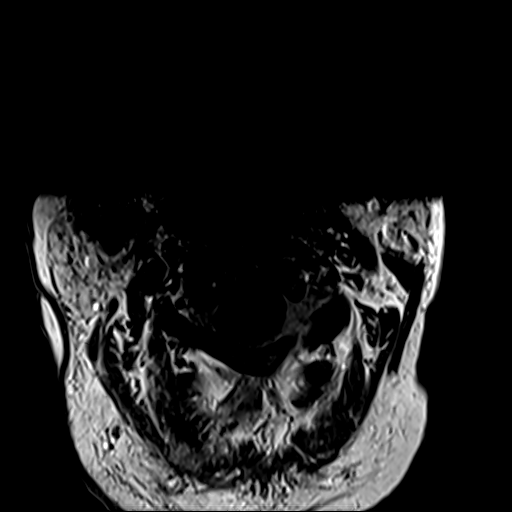

[Series 14: T2 · sagittal · 3.0mm · 0.69mm/px · 4 of 15 slices shown (2 of 2)]
[im 1/15]
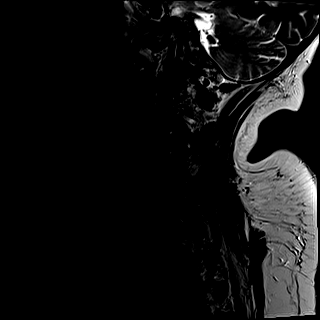
[im 5/15]
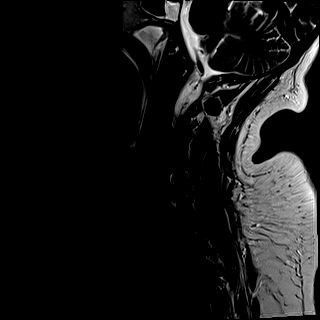
[im 10/15]
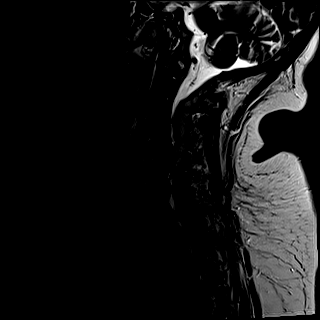
[im 15/15]
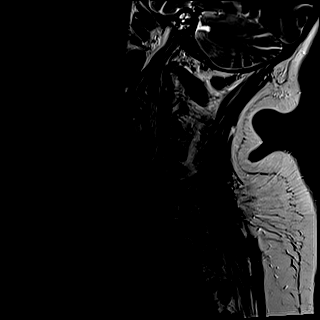

[Series 15: T1 fat-sat post-contrast · sagittal · 3.0mm · 0.69mm/px · 4 of 15 slices shown]
[im 1/15]
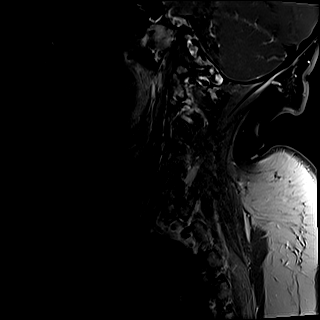
[im 5/15]
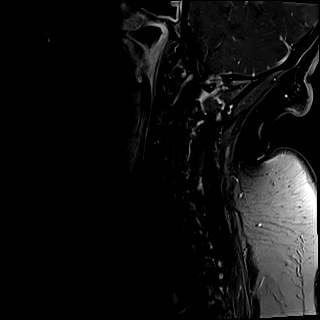
[im 10/15]
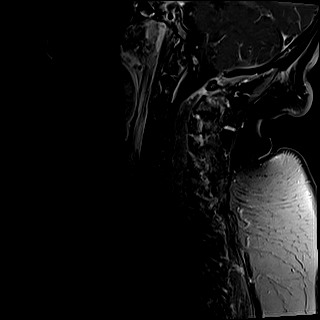
[im 15/15]
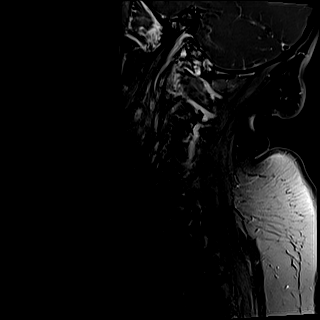

[32 of 48 positions shown; findings below may reference images not displayed]

FINDINGS: Alignment: Straightening of the normal cervical lordosis. No
significant listhesis.

Vertebrae: Vertebral body height maintained without acute or chronic
fracture. Bone marrow signal intensity diffusely decreased on T1
weighted sequence, nonspecific, but most commonly related to anemia,
smoking or obesity. No discrete or worrisome osseous lesions. No
findings to suggest osteomyelitis discitis or septic arthritis
within the cervical spine.

Cord: Extensive long segment epidural abscess extends from the
thoracic spine into the cervical spine to the level of the
cervicomedullary junction. Abscess primarily localized within the
dorsal epidural space and is greater on the left. Collection is
maximal in nature at approximately the level of C4-5, measuring up
to 7 mm in maximal AP diameter. Secondary moderate to severe diffuse
spinal stenosis with mild cord flattening, but no cord signal
changes.

Posterior Fossa, vertebral arteries, paraspinal tissues: Scattered
dural and leptomeningeal enhancement seen about the visualized base
of the brain and posterior fossa, consistent with associated
meningitis. No visible intracranial collections. Craniocervical
junction remains patent. Paraspinous soft tissues demonstrate no
acute finding. No soft tissue collections. Absent flow void within
the left vertebral artery, likely occluded. Endotracheal tube in
place.

Disc levels:

C2-C3: Negative interspace. Left dorsal epidural abscess with
moderate spinal stenosis. Foramina remain patent.

C3-C4: Mild disc bulge with uncovertebral spurring. Left dorsal
epidural abscess. Moderate to severe spinal stenosis. Superimposed
right-sided facet hypertrophy. No significant foraminal
encroachment.

C4-C5: Intervertebral disc space narrowing with diffuse disc
osteophyte complex, with left greater than right uncovertebral and
facet hypertrophy. Dorsal epidural abscess on the left. Moderate to
severe spinal stenosis with moderate left C5 foraminal narrowing.

C5-C6: Degenerative intervertebral disc space narrowing with diffuse
disc osteophyte complex. Left dorsal epidural abscess. Severe spinal
stenosis with cord flattening but no definite cord signal changes.
Severe bilateral C6 foraminal narrowing.

C6-C7: Degenerative intervertebral disc space narrowing with diffuse
disc osteophyte. Thin left dorsal epidural abscess. Moderate to
severe spinal stenosis. No visible cord signal changes. Moderate
left worse than right C7 foraminal narrowing.

C7-T1: Small right paracentral disc protrusion. Dorsal epidural
abscess. Moderate spinal stenosis. Left-sided facet arthrosis.
Foramina remain patent.

Delete that
IMPRESSION: 1. Extensive epidural abscess extending from the thoracic spine into
the cervical spine to the level of the cervicomedullary junction.
Secondary moderate to severe diffuse spinal stenosis throughout the
cervical spine, most pronounced at C5-6. No visible cord signal
changes.
2. Dural and leptomeningeal enhancement about the visualized base of
brain and posterior fossa, consistent with associated meningitis. No
visible intracranial collections.
3. Underlying moderate degenerative spondylosis and facet arthrosis,
most pronounced at C4-5 through C6-7. Associated moderate left C5
and bilateral C7 foraminal stenosis, with severe bilateral C6
foraminal narrowing.
4. Absent flow void within the left vertebral artery, likely
occluded.

## 2022-02-23 IMAGING — MR MR LUMBAR SPINE WO/W CM
11 of 16 series · 29 of 48 positions shown · IV contrast (gadavist)
Comparison: Noncontrast lumbar spine MRI [DATE]. Chest
radiograph [DATE].

CLINICAL DATA: Low back pain. Meningitis. Concern for epidural
abscess.

EXAM:
MRI THORACIC AND LUMBAR SPINE WITHOUT AND WITH CONTRAST
TECHNIQUE: Multiplanar and multiecho pulse sequences of the thoracic and lumbar
spine were obtained without and with intravenous contrast.
CONTRAST:  10mL GADAVIST GADOBUTROL 1 MMOL/ML IV SOLN

[Series 20: T1 · sagittal · 4.0mm · 1.72mm/px · 1 of 5 slices shown (1 of 5)]
[im 1/5]
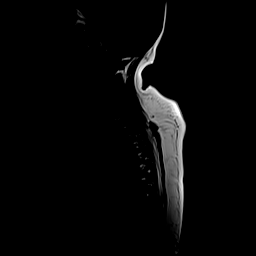

[Series 22: T1 · sagittal · 3.0mm · 1.00mm/px · 1 of 15 slices shown (2 of 5)]
[im 1/15]
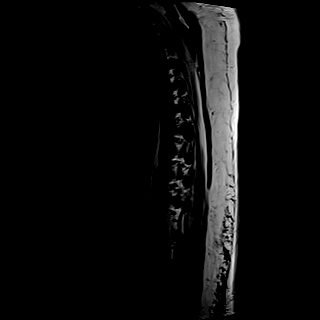

[Series 23: T2 · sagittal · 3.0mm · 0.83mm/px · 1 of 13 slices shown (1 of 3)]
[im 1/13]
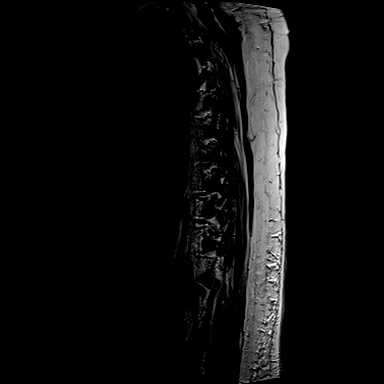

[Series 24: T2 · axial · 4.0mm · 0.78mm/px · z∈[-350,-83]mm · 4 of 37 slices shown (2 of 3)]
[im 1/37]
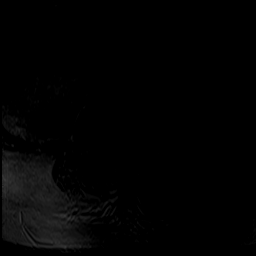
[im 13/37]
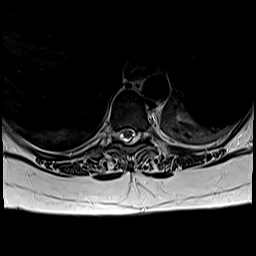
[im 25/37]
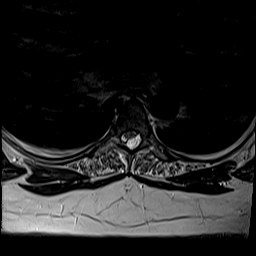
[im 37/37]
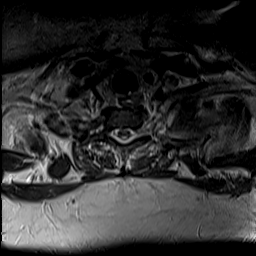

[Series 26: T1 · axial · 4.0mm · 0.39mm/px · z∈[-288,-83]mm · 5 of 39 slices shown (3 of 5)]
[im 1/39]
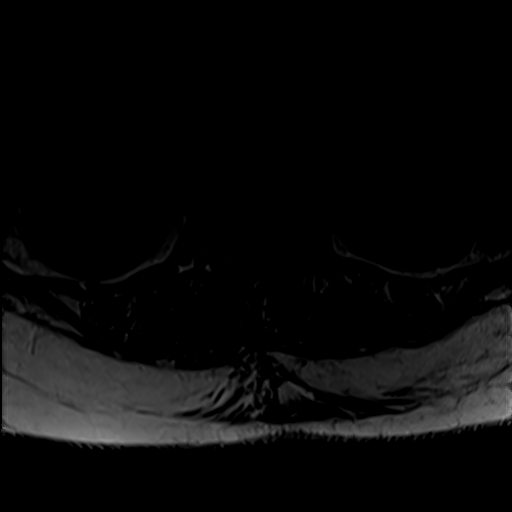
[im 10/39]
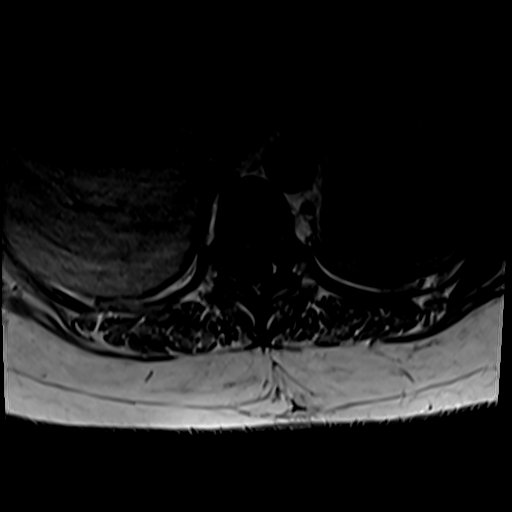
[im 20/39]
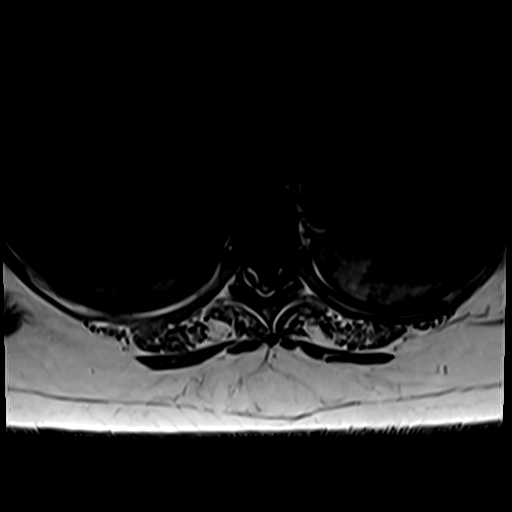
[im 29/39]
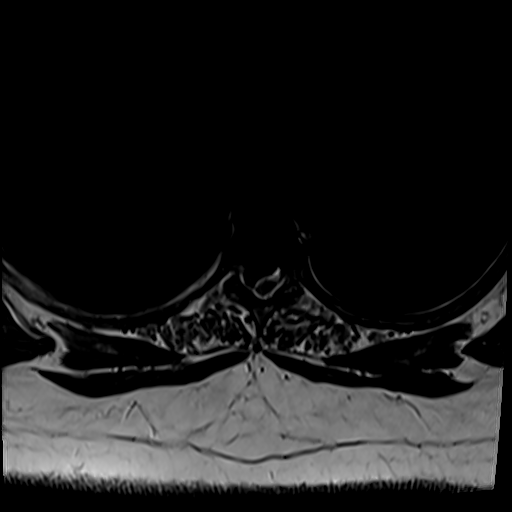
[im 39/39]
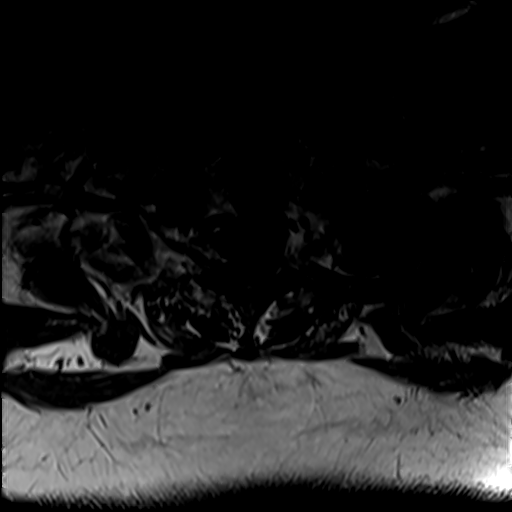

[Series 31: T1 · sagittal · 4.0mm · 0.81mm/px · 2 of 15 slices shown (4 of 5)]
[im 1/15]
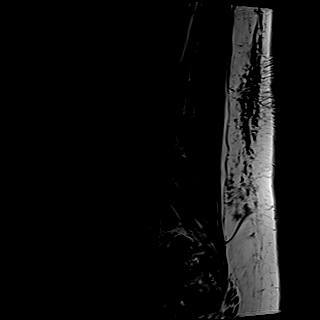
[im 15/15]
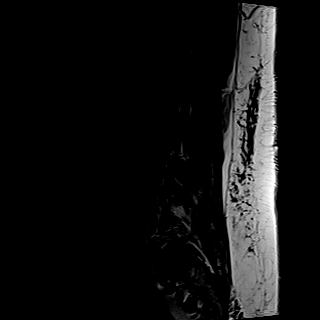

[Series 33: T2 · axial · 4.0mm · 0.62mm/px · z∈[-496,-294]mm · 5 of 39 slices shown (3 of 3)]
[im 1/39]
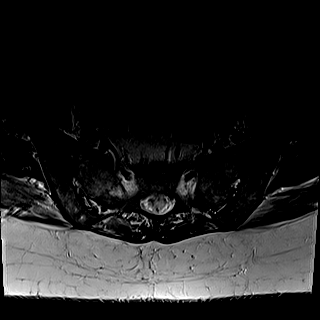
[im 10/39]
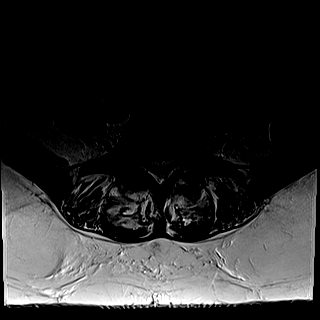
[im 20/39]
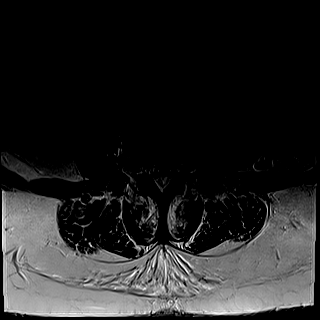
[im 29/39]
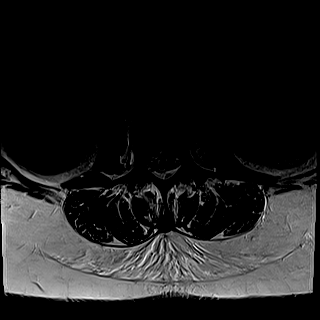
[im 39/39]
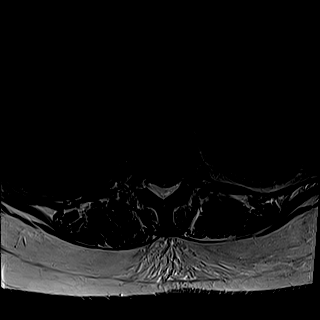

[Series 34: T1 · axial · 4.0mm · 0.39mm/px · z∈[-496,-294]mm · 5 of 39 slices shown (5 of 5)]
[im 1/39]
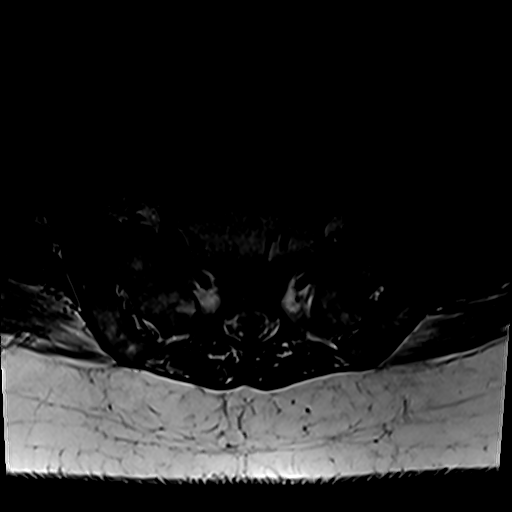
[im 10/39]
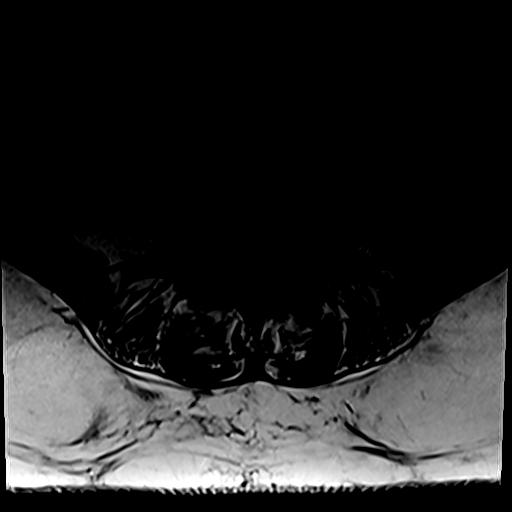
[im 20/39]
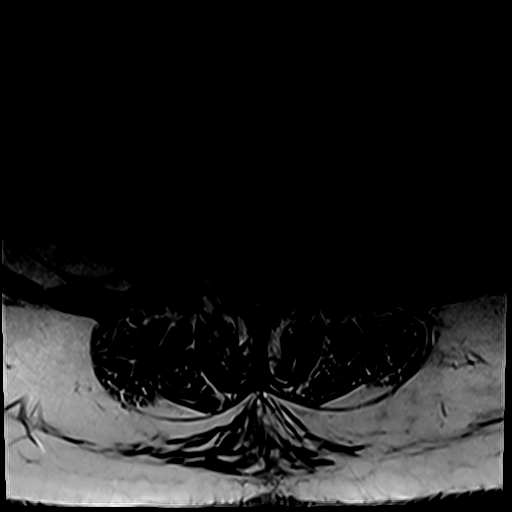
[im 29/39]
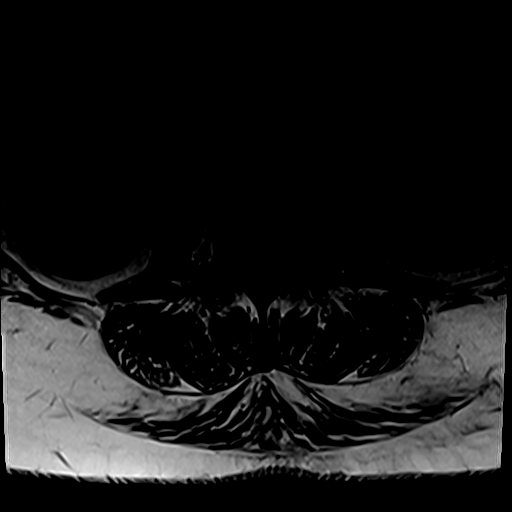
[im 39/39]
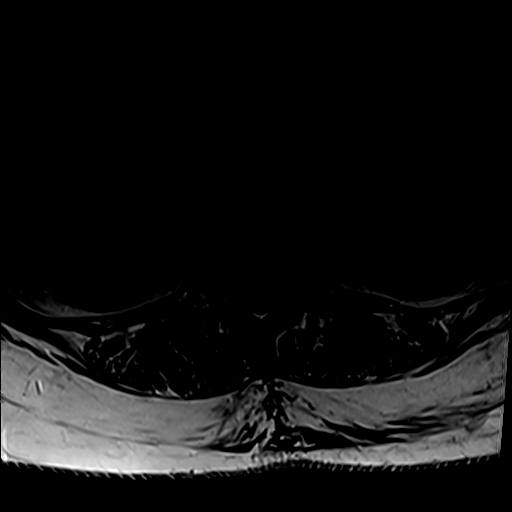

[Series 35: T2 post-contrast · sagittal · 4.0mm · 0.81mm/px · 2 of 15 slices shown]
[im 1/15]
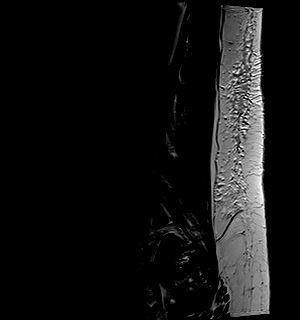
[im 15/15]
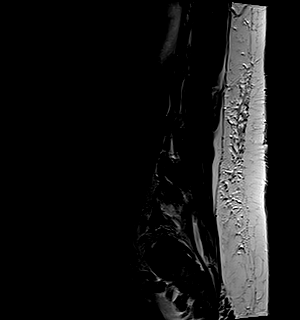

[Series 36: T1 fat-sat post-contrast · sagittal · 4.0mm · 0.81mm/px · 2 of 15 slices shown (1 of 2)]
[im 1/15]
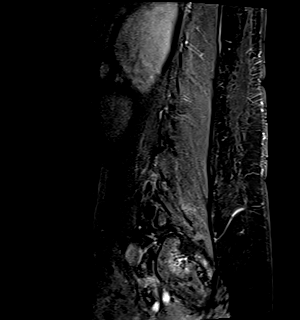
[im 15/15]
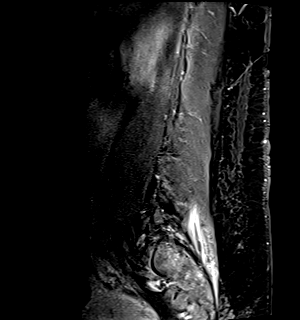

[Series 38: T1 fat-sat post-contrast · sagittal · 3.0mm · 1.00mm/px · 1 of 13 slices shown (2 of 2)]
[im 1/13]
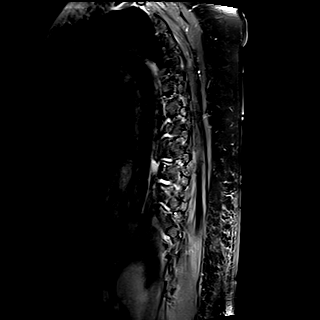

[29 of 48 positions shown; findings below may reference images not displayed]

FINDINGS: MRI THORACIC SPINE FINDINGS

Alignment:  No listhesis.

Vertebrae: Diffusely decreased bone marrow T1 signal intensity,
nonspecific though can be seen with anemia, smoking, and obesity. No
fracture, suspicious marrow lesion, or evidence of discitis.
Complex, peripherally enhancing dorsal epidural fluid collection
throughout the entirety of the thoracic spine with some internal
septations. The collection measures up to 9 mm in AP thickness.

Cord: Mild-to-moderate mass effect on the spinal cord by the
epidural fluid collection. No cord signal abnormality.

Paraspinal and other soft tissues: Likely dependent atelectasis in
both lungs. 2.5 cm focal signal abnormality in the right lung apex
which appears to extend to the pleura with a corresponding opacity
on the prior chest radiograph.

Disc levels:

Severe spinal stenosis throughout the thoracic spine due to the
epidural collection. Small right paracentral disc protrusion at T7-8
and small left paracentral disc protrusion at T9-10.

MRI LUMBAR SPINE FINDINGS

Segmentation: Counting down from the craniocervical junction, the
lowest lumbar type vertebrae appears to reflect a completely
lumbarized S1.

Alignment:  Normal.

Vertebrae: Diffusely decreased bone marrow T1 signal intensity as
noted above. No fracture, suspicious marrow lesion, or evidence of
discitis. Edema and enhancement surrounding the right L4-5 and
bilateral L5-S1 facet joints with associated joint effusions.
Peripherally enhancing dorsal epidural fluid collection (with less
pronounced ventral component as well) throughout the entirety of the
lumbar spine measuring up to 8 mm in AP thickness.

Conus medullaris: Extends to the L1-2 level. Diffuse dural
enhancement and suspected enhancement of the cauda equina nerve
roots.

Paraspinal and other soft tissues: Inflammation in the soft tissues
posterior to the L4-S1 facet joints bilaterally.

Disc levels:

Moderate to severe spinal stenosis throughout the lumbar spine due
to the epidural fluid collection. Mild disc bulging from L3-4 to
L5-S1 with a superimposed small right subarticular to right
foraminal disc protrusion at L3-4. No compressive neural foraminal
stenosis.
IMPRESSION: 1. Epidural abscess throughout the entirety of the thoracic and
lumbar spine, likely extending into the cervical spine. Associated
severe diffuse spinal stenosis without evidence of cord edema.
2. Suspected enhancement along the cauda equina likely related to
known meningitis.
3. Inflammation about the L4-5 and L5-S1 facet joints, suspicious
for septic arthritis in this setting.
4. 2.5 cm focal signal abnormality in the right lung apex.
Radiographic follow-up is recommended as infectious and neoplastic
causes are possible.

These results will be called to the ordering clinician or
representative by the Radiologist Assistant, and communication
documented in the PACS or [REDACTED].

## 2022-02-23 MED ORDER — CHLORHEXIDINE GLUCONATE 0.12% ORAL RINSE (MEDLINE KIT)
15.0000 mL | Freq: Two times a day (BID) | OROMUCOSAL | Status: DC
  Administered 2022-02-24 – 2022-03-21 (×51): 15 mL via OROMUCOSAL

## 2022-02-23 MED ORDER — MIDAZOLAM HCL 2 MG/2ML IJ SOLN
2.0000 mg | Freq: Once | INTRAMUSCULAR | Status: AC | PRN
  Administered 2022-02-23: 2 mg via INTRAVENOUS
  Filled 2022-02-23: qty 2

## 2022-02-23 MED ORDER — DEXMEDETOMIDINE HCL IN NACL 400 MCG/100ML IV SOLN
0.4000 ug/kg/h | INTRAVENOUS | Status: DC
  Administered 2022-02-23: 0.4 ug/kg/h via INTRAVENOUS
  Administered 2022-02-23: 0.5 ug/kg/h via INTRAVENOUS
  Administered 2022-02-23 – 2022-02-24 (×2): 0.4 ug/kg/h via INTRAVENOUS
  Administered 2022-02-25: 0.5 ug/kg/h via INTRAVENOUS
  Administered 2022-02-25: 0.4 ug/kg/h via INTRAVENOUS
  Administered 2022-02-26: 0.8 ug/kg/h via INTRAVENOUS
  Administered 2022-02-26: 0.6 ug/kg/h via INTRAVENOUS
  Administered 2022-02-26: 0.7 ug/kg/h via INTRAVENOUS
  Administered 2022-02-26: 0.8 ug/kg/h via INTRAVENOUS
  Administered 2022-02-27: 0.7 ug/kg/h via INTRAVENOUS
  Administered 2022-02-27: 0.5 ug/kg/h via INTRAVENOUS
  Administered 2022-02-27 (×2): 0.6 ug/kg/h via INTRAVENOUS
  Administered 2022-02-27 – 2022-02-28 (×2): 0.7 ug/kg/h via INTRAVENOUS
  Administered 2022-02-28: 0.601 ug/kg/h via INTRAVENOUS
  Administered 2022-02-28 – 2022-03-01 (×5): 0.7 ug/kg/h via INTRAVENOUS
  Administered 2022-03-02: 0.5 ug/kg/h via INTRAVENOUS
  Administered 2022-03-02: 0.7 ug/kg/h via INTRAVENOUS
  Filled 2022-02-23 (×27): qty 100

## 2022-02-23 MED ORDER — FREE WATER
200.0000 mL | Status: DC
  Administered 2022-02-23 – 2022-02-25 (×7): 200 mL

## 2022-02-23 MED ORDER — POTASSIUM CHLORIDE 20 MEQ PO PACK
20.0000 meq | PACK | ORAL | Status: AC
  Administered 2022-02-23 (×2): 20 meq
  Filled 2022-02-23 (×2): qty 1

## 2022-02-23 MED ORDER — ORAL CARE MOUTH RINSE
15.0000 mL | OROMUCOSAL | Status: DC
  Administered 2022-02-24 – 2022-03-19 (×229): 15 mL via OROMUCOSAL

## 2022-02-23 MED ORDER — PHENYLEPHRINE 80 MCG/ML (10ML) SYRINGE FOR IV PUSH (FOR BLOOD PRESSURE SUPPORT)
PREFILLED_SYRINGE | INTRAVENOUS | Status: AC
  Filled 2022-02-23: qty 10

## 2022-02-23 MED ORDER — DESMOPRESSIN ACETATE 4 MCG/ML IJ SOLN
1.0000 ug | Freq: Four times a day (QID) | INTRAMUSCULAR | Status: AC
  Administered 2022-02-23 – 2022-02-28 (×20): 1 ug via INTRAVENOUS
  Filled 2022-02-23 (×24): qty 1

## 2022-02-23 MED ORDER — GADOBUTROL 1 MMOL/ML IV SOLN
10.0000 mL | Freq: Once | INTRAVENOUS | Status: AC | PRN
  Administered 2022-02-23: 10 mL via INTRAVENOUS

## 2022-02-23 MED ORDER — VITAL HIGH PROTEIN PO LIQD
1000.0000 mL | ORAL | Status: DC
  Administered 2022-02-23: 1000 mL

## 2022-02-23 MED ORDER — POTASSIUM CHLORIDE 10 MEQ/50ML IV SOLN
10.0000 meq | INTRAVENOUS | Status: AC
  Administered 2022-02-23 (×4): 10 meq via INTRAVENOUS
  Filled 2022-02-23 (×4): qty 50

## 2022-02-23 NOTE — Progress Notes (Signed)
Subjective: ?Reportedly was following commands earlier today.  She was given a bolus of sedation in preparation for MRI just prior to my seeing her ? ?Exam: ?Vitals:  ? 02/23/22 1200 02/23/22 1300  ?BP: (!) 187/69 (!) 155/57  ?Pulse: 95 85  ?Resp: (!) 21 20  ?Temp: (!) 100.4 ?F (38 ?C) 100 ?F (37.8 ?C)  ?SpO2: 100% 100%  ? ?Gen: In bed, intubated ? ?Neuro: ?MS: Opens eyes to noxious stimulation, does not clearly follow commands but does nod head when asked the question ?CN: She fixates and tracks briefly, but blinks to frequently to check blink to threat, eyes are midline ?Motor: She moves both upper extremities spontaneously, but I see absolutely no movement including to noxious stimulation of the lower extremities ?Sensory: She nods yes when I ask her if she feels me pinching her toes, but unclear how reliable this is ?DTR: Depressed in bilateral lower extremities ? ?Pertinent Labs: ?Creatinine 1.26 ? ?Impression: 56 year old female with E. coli meningitis.  The fact that her mental status appears to be improving is very reassuring.  I am concerned that she does not appear to be moving her lower extremities at this time, though given that she just had sedation bolus prior to my exam, unclear of the significance.  She is going down for an L-spine MRI, and I will add a T-spine MRI to this. ? ?Recommendations: ?1) agree with L-spine imaging, I have also added T-spine. ?2) appreciate ID assistance. ? ?Roland Rack, MD ?[facility] ?[phone] ? ?If 7pm- 7am, please page neurology on call as listed in [place]. ? ?

## 2022-02-23 NOTE — Progress Notes (Signed)
PCCM Progress Note ? ?Updated brother, Ginny Forth, at bedside and sister, Crystal, on speakerphone on patient's progress with improved Na. Continues to have fever and persistent leukocytosis. ID was consulted yesterday and recommended MRI to rule out epidural abscess. Family requested to hold MRI overnight due to concern for her renal function. Updated family that renal function has been improving daily with GFR today at 50. Unfortunately today, MRI scheduled is full and currently has one tech. Will work in patient when able today. ?

## 2022-02-23 NOTE — Progress Notes (Signed)
eLink Physician-Brief Progress Note ?Patient Name: Gabrielle Vincent ?DOB: July 17, 1966 ?MRN: [id number] ? ? ?Date of Service ? 02/23/2022  ?HPI/Events of Note ? Transferred from [facility] because of plans for spinal decompression in the morning.  ?eICU Interventions ? Chart reviewed  ? ? ? ?Intervention Category ?Minor Interventions: Other: ? ?Mauri Brooklyn, P ?02/23/2022, 11:43 PM ?

## 2022-02-23 NOTE — Progress Notes (Signed)
Family agreeable with MRI this morning after speaking with MD; notified MRI, unable to give an estimate on time for scan at this time, MRI will return call to RN when available to take patient down.  ?

## 2022-02-23 NOTE — Progress Notes (Signed)
eLink Physician-Brief Progress Note ?Patient Name: Gabrielle Vincent ?DOB: 09/19/1966 ?MRN: [id number] ? ? ?Date of Service ? 02/23/2022  ?HPI/Events of Note ? Sodium 147  ?eICU Interventions ? DDAVP dose decreased  ? ? ? ?Intervention Category ?Major Interventions: Electrolyte abnormality - evaluation and management ? ?Mauri Brooklyn, P ?02/23/2022, 1:21 AM ?

## 2022-02-23 NOTE — Progress Notes (Signed)
? ?NAME:  Gabrielle Vincent, MRN:  [id number], DOB:  20-Nov-1965, LOS: 9 ?ADMISSION DATE:  02/14/2022, CONSULTATION DATE:  5/5 ?REFERRING MD:  Rai, CHIEF COMPLAINT:  acute encephalopathy and sepsis   ? ?History of Present Illness:  ?56 year old female who was admitted on 5/3 with chief complaint of severe low back pain radiating down her left leg.  Also had associated hematuria and dysuria.  She had no weakness although low did say pain impacted her ability to walk.  Also noted poor p.o. intake, decreased urination as well as urinary retention, hematuria, dysuria, and some nausea.  ? ?CT scan was negative for obstructive uropathy did show fatty liver infiltrates, there is cholelithiasis but no acute cholecystitis there is mild circumferential thickening of the distal esophagus global enlargement of the uterus with thickened appearance of the endometrial stripe with radiology recommending nonemergent pelvic ultrasound.  Diagnostic evaluation notable for new AKI with serum creatinine 3.64, acute transaminitis leukocytosis, and hyponatremia of 127 she was admitted for further evaluation, cultures were sent, and she was started on IV ceftriaxone. ? ?On 5/3 patient was noted to be more confused, speech was slurred , Working diagnosis was #1 urinary tract infection with resultant sepsis and also low back pain for which neurosurgery was consulted as MRI finding showed acute herniated L3-L4 disc with left lumbar radiculopathy.  It was felt that pain control via lumbar injection may help in that this could be treated conservatively. ? ?On 5/4 patient becoming intermittently agitated then lethargic, because of this a CT of head was obtained this was severely limited due to motion degraded meant but there was concern about hypodense area in the right basal ganglia section raising concern for acute infarct.  On 5/5 a rapid response was called the patient was severely agitated, pulled out IVs, pull out Foley catheter ? ?Later that  afternoon mental status continued to worsen.  Neurology consult was obtained in addition to this infectious disease was consulted with urine growing E. coli and narrowed antibiotics to cefazolin stopped vancomycin and recommended supportive care because her mental status continued to decline, she had severe metabolic derangements, and we were unable to provide medical care on the [place] ward she was transferred to the ICU for higher level of care.  On initial arrival she was agitated requiring several nurses to hold her down, tachypneic tachycardic would not follow commands had marked increased work of breathing.  An intraosseous line was placed in the right lower extremity, she was intubated for airway protection to facilitate further MRI imaging, and a right IJ triple-lumen catheter was placed ? ?Pertinent  Medical History  ?Class II obesity, hepatic steatosis, seasonal allergies, varicose veins diverticulosis, HL  ? ?Significant Hospital Events: ?Including procedures, antibiotic start and stop dates in addition to other pertinent events   ?5/3 admitted with back pain and urinary tract infection , Further complicated by AKI hyponatremia and multiple metabolic derangements.   ?CT imaging for renal stones showed no acute abdominal pelvic findings there was no obstructive uropathy there was severe fatty liver disease there was cholelithiasis but no cholecystitis there is colonic diverticulosis but no diverticulitis, mild circumferential thickening of the esophagus globular enlargement of the uterus with radiology recommending nonemergent pelvic ultrasound found to have uterine fibroid abdominal Ultrasound showed fatty liver but was epididymides negative.   ?MRI of the lumbar spine showed small left subarticular disc extrusion with inferior migration at L3-L4 correlating with radiculopathy.  She was started on ceftriaxone cultures were sent ?5/4 increased confusion CT  brain obtained raising concern for possible acute  infarct in the right basal ganglia ?5/5 progressive encephalopathy , Worsening leukocytosis, hypernatremia, hyperchloremia, slowly improving renal function, slowly improving procalcitonin, moved to ICU due to delirium, intubated for airway protection and to facilitate MRI imaging right IJ central line placed due to limited IV access.  Seen by neurology, also seen by infectious disease with ceftriaxone changed to cefazolin ?5/6 antibiotics changed to meningitis coverage given MRI findings of fluid in the ventricles.  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  DDAVP and volume resuscitation started ?5/11 hypernatremia slowly downtrending, continue DDAVP and IV resuscitation per nephrology ?5/12 Sodium downtrended to 147 this am ? ?Interim History / Subjective:  ?Sedated on vent  ?3L urine output in the last 24hrs  ? ?Objective   ?Blood pressure (!) 124/55, pulse 70, temperature (!) 100.4 ?F (38 ?C), resp. rate (!) 24, height '5\' 4"'$  (1.626 m), weight 111.1 kg, SpO2 97 %. ?   ?Vent Mode: PRVC ?FiO2 (%):  [30 %] 30 % ?Set Rate:  [24 bmp] 24 bmp ?Vt Set:  [440 mL] 440 mL ?PEEP:  [5 cmH20] 5 cmH20 ?Pressure Support:  [8 cmH20-10 cmH20] 10 cmH20 ?Plateau Pressure:  [15 cmH20-17 cmH20] 17 cmH20  ? ?Intake/Output Summary (Last 24 hours) at 02/23/2022 0701 ?Last data filed at 02/23/2022 0600 ?Gross per 24 hour  ?Intake 6938.34 ml  ?Output 3075 ml  ?Net 3863.34 ml  ? ? ?Filed Weights  ? 02/21/22 0500 02/22/22 0455 02/23/22 0500  ?Weight: 99.8 kg 106.8 kg 111.1 kg  ? ?Physical exam ?General: Acute on chronically ill appearing elderly middle aged female lying in bed on mechanical ventilation, in NAD ?HEENT: ETT, MM pink/moist, PERRL,  ?Neuro: Sedated on vent  ?CV: s1s2 regular rate and rhythm, no murmur, rubs, or  gallops,  ?PULM:  Clear to ascultation, no increased work of breathing, tolerating vent  ?GI: soft, bowel sounds active in all 4 quadrants, non-tender, non-distended ?Extremities: warm/dry, no edema  ?Skin: no rashes or lesions ? ?Assessment & Plan:  ?Principal Problem: ?  Sepsis secondary to UTI [facility]) ?Active Problems: ?  Hyponatremia ?  Hypokalemia ?  AKI (acute kidney injury) ([place]) ?  Abnormal LFTs ?  Class II obesity ?  Hepatic steatosis ?  Cholelithiasis ?  Esophageal thickening ?  Acute low back pain ?  Encephalopathy acute ?  Acute respiratory failure ([place]) ?  Radiculopathy of lumbar region ?  E. coli UTI ?  Meningitis ?  Bacterial meningitis ?  Diabetes insipidus ([place]) ?Bacterial Meningitis ? ?Acute metabolic encephalopathy  ?Bacterial meningitis ?- Secondary to E.coli UTI  and E.coli  meningitis (s/p LP on 5/7 with purulent appearing CSF with cultures pos for E.coli pan S except for ampicillin/unasyn). MRI neg for CVA. CSF cultures with E.coli ?P: ?Neurology following appreciate assistance  ?NSGY evaluated during admit with no surgical interventions identified  ?Maintain neuro protective measures; goal for eurothermia, euglycemia, eunatermia, normoxia, and PCO2 goal of 35-40 ?Nutrition and bowel regiment  ?Seizure precautions  ?Aspirations precautions  ?Continue IV Ceftriaxone  ?Minimize sedation as able to allow for trending neuro exams  ?Daily wake up assessment  ?ID recommends obtaining MRI spine with contrast this was ordered 5/11 but family requested test not be preformed until they spoke with a provider again.  Will attempts MRI today but schedule is currently full and image may not be obtained today  ? ?Central DI  ?- ? 2/2 meningitis.  Has had climbing Na and overnight had 7.1L UOP. Clinically, this fits with a central DI ?-Urine osmolarity dropped as low as 209 on 5/10, after initiation of DDAVP and IV resuscitation urine osmolarity has risen to 418 by morning of 5/11 ?P: ?Nephrology following,  appreciate assistance  ?Continue DDAVP reduced back to 17mg overnight due to 11 point drop in sodium levels  ?Strict intake and output  ?Follow sodium levels every 4 hrs ?Continue D5W at 750mhr ? ?Acute hypoxic re

## 2022-02-23 NOTE — Progress Notes (Signed)
Called [facility] and gave the report about pt. ?

## 2022-02-23 NOTE — Progress Notes (Signed)
Called [facility] to have patient transferred from [facility] to [facility]; will await follow up call to give report. ?

## 2022-02-23 NOTE — Progress Notes (Signed)
Transported pt on the vent from MRI to 1236 without any complications.  ?

## 2022-02-23 NOTE — Progress Notes (Signed)
?   ? ?[facility] ? ?Date of Admission:  02/14/2022    ?       ?Reason for visit: Follow up on UTI/Meningitis ?  ?Current antibiotics: ?Ceftriaxone ? ? ? ?ASSESSMENT:   ? ?56 y.o. female admitted with: ? ?E coli UTI and Meningitis:  She presented to the hospital 5/3 with back pain, dysuria, and urinary retention concerning for sepsis due to UTI.  MRI lumbar spine w/o contrast was obtained in the setting of AKI.  This was notable for diffuse thecal sac narrowing throughout most of the lumbar spine.  This was attributed to epidural lipomatosis.  Based on urine cultures growing a sensitive E. Coli and blood cultures being negative, her antibiotics were adjusted as such.  She then became more encephalopathic and agitated.  MRI brain was obtained due to concern for acute stroke on a motion degraded CT head.  This raised the concern for meningitis leading to an LP.  This was consistent with bacterial infection and her CSF cultures also grew E. Coli.  She remains intubated, sedated in the ICU.  She has also had ongoing fever and leukocytosis. ?Acute kidney injury: Creatinine was 3.3 on admission.  It was improved greatly since admission.  Her UA on admission was c/w UTI in the setting of consistent symptoms as well.  A CT renal stone study was negative for obstruction. ?Diabetes insipidus and hypernatremia: Being managed by CCM and nephrology. ?Acute low back pain: She was given steroids and pain medication on admission.  NSGY saw her earlier and recommended ESI, however, this was never completed due to her clinical worsening. ?Acute hypoxemic respiratory failure: Requiring mechanical ventilation for airway protection.  ? ? ?RECOMMENDATIONS:   ? ?Her overall presentation raises concern that she had severe UTI leading to epidural spine disease as it would be very unusual to have E coli meningitis with no alternate explanation.  I wonder if the epidural lipomatosis noted on MRI w/o contrast may  actually be an epidural abscess leading to the development of meningitis.  Her ongoing fevers is suspicious for unidentified source control as well ?MRI lumbar spine with contrast pending to further assess ?Currently on adequate CNS coverage with high dose ceftriaxone and will continue  ?Will follow ? ? ?Principal Problem: ?  Sepsis secondary to UTI [facility]) ?Active Problems: ?  Hyponatremia ?  Hypokalemia ?  AKI (acute kidney injury) ([place]) ?  Abnormal LFTs ?  Class II obesity ?  Hepatic steatosis ?  Cholelithiasis ?  Esophageal thickening ?  Acute low back pain ?  Encephalopathy acute ?  Acute respiratory failure ([place]) ?  Radiculopathy of lumbar region ?  E. coli UTI ?  Meningitis ?  Bacterial meningitis ?  Diabetes insipidus ([place]) ? ? ? ?MEDICATIONS:   ? ?Scheduled Meds: ?? chlorhexidine gluconate (MEDLINE KIT)  15 mL Mouth Rinse BID  ?? Chlorhexidine Gluconate Cloth  6 each Topical Daily  ?? desmopressin  1 mcg Intravenous QID  ?? docusate  100 mg Per Tube BID  ?? enoxaparin (LOVENOX) injection  40 mg Subcutaneous Daily  ?? feeding supplement (VITAL HIGH PROTEIN)  1,000 mL Per Tube Q24H  ?? free water  400 mL Per Tube Q4H  ?? insulin aspart  0-15 Units Subcutaneous Q4H  ?? insulin aspart  2 Units Subcutaneous Q4H  ?? mouth rinse  15 mL Mouth Rinse 10 times per day  ?? metoprolol tartrate  10 mg Intravenous Q6H  ?? pantoprazole (PROTONIX) IV  40 mg Intravenous Q24H  ?? polyethylene glycol  17 g Per Tube Daily  ?? potassium chloride  20 mEq Per Tube Q4H  ? ?Continuous Infusions: ?? cefTRIAXone (ROCEPHIN)  IV Stopped (02/22/22 2232)  ?? dexmedetomidine (PRECEDEX) IV infusion Stopped (02/23/22 0618)  ?? dextrose 75 mL/hr at 02/23/22 0700  ?? fentaNYL infusion INTRAVENOUS 175 mcg/hr (02/23/22 0700)  ?? potassium chloride 50 mL/hr at 02/23/22 0700  ? ?PRN Meds:.acetaminophen (TYLENOL) oral liquid 160 mg/5 mL, fentaNYL, fentaNYL (SUBLIMAZE) injection, hydrALAZINE, lip balm, midazolam, ondansetron **OR** ondansetron  (ZOFRAN) IV, oxyCODONE ? ?SUBJECTIVE:  ? ?24 hour events:  ?MRI not done last PM due to family concerns regarding GFR.  They are more agreeable today and imaging pending ?Febrile,  TMax 101.7 ?Has Foley and CVC ?WBC stable ?Creatinine 1.26 ?No other new imaging ?No new micro ?Vent settings minimal ?CBGs controlled ?No pressors, BP/MAP adequate ? ?Discussed with her brother Winferd Humphrey.  She is still intubated.  They are doing a WUA this morning.  Her vent settings are minimal.  She is more alert.  ? ?Review of Systems  ?Unable to perform ROS: Intubated  ? ?  ?OBJECTIVE:  ? ?Blood pressure (!) 122/59, pulse 71, temperature (!) 100.4 ?F (38 ?C), resp. rate (!) 25, height 5' 4"  (1.626 m), weight 111.1 kg, SpO2 99 %. ?Body mass index is 42.04 kg/m?. ? ?Physical Exam ?Constitutional:   ?   Comments: Ill appearing, intubated, sedated.   ?HENT:  ?   Head: Normocephalic and atraumatic.  ?Eyes:  ?   Extraocular Movements: Extraocular movements intact.  ?   Conjunctiva/sclera: Conjunctivae normal.  ?Neck:  ?   Comments: CVC in place.  ?Pulmonary:  ?   Comments: Ventilated breath sounds, symmetric chest rise and fall.  ?Abdominal:  ?   General: There is no distension.  ?   Palpations: Abdomen is soft.  ?   Tenderness: There is no abdominal tenderness.  ?Genitourinary: ?   Comments: Foley in place.  ?Musculoskeletal:     ?   General: Normal range of motion.  ?   Cervical back: Normal range of motion and neck supple.  ?   Right lower leg: No edema.  ?   Left lower leg: No edema.  ?Skin: ?   General: Skin is warm and dry.  ?   Findings: No rash.  ?Neurological:  ?   General: No focal deficit present.  ?   Comments: Sedated.   ? ? ? ?Lab Results: ?Lab Results  ?Component Value Date  ? WBC 15.5 (H) 02/23/2022  ? HGB 11.0 (L) 02/23/2022  ? HCT 35.6 (L) 02/23/2022  ? MCV 93.2 02/23/2022  ? PLT 181 02/23/2022  ?  ?Lab Results  ?Component Value Date  ? NA 147 (H) 02/23/2022  ? K 3.3 (L) 02/23/2022  ? CO2 18 (L) 02/23/2022  ? GLUCOSE 191  (H) 02/23/2022  ? BUN 34 (H) 02/23/2022  ? CREATININE 1.26 (H) 02/23/2022  ? CALCIUM 7.9 (L) 02/23/2022  ? GFRNONAA 50 (L) 02/23/2022  ?  ?Lab Results  ?Component Value Date  ? ALT 22 02/22/2022  ? AST 19 02/22/2022  ? ALKPHOS 105 02/22/2022  ? BILITOT 0.4 02/22/2022  ? ? ?No results found for: CRP ? ?No results found for: ESRSEDRATE ?  ?I have reviewed the micro and lab results in Epic. ? ?Imaging: ?CT HEAD WO CONTRAST (5MM) ? ?Result Date: 02/21/2022 ?CLINICAL DATA:  Hydrocephalus, shunted, follow-up. Rule out hydrocephalus. EXAM: CT HEAD WITHOUT CONTRAST TECHNIQUE: Contiguous  axial images were obtained from the base of the skull through the vertex without intravenous contrast. RADIATION DOSE REDUCTION: This exam was performed according to the departmental dose-optimization program which includes automated exposure control, adjustment of the mA and/or kV according to patient size and/or use of iterative reconstruction technique. COMPARISON:  Brain MRI 02/16/2022. Head CT 02/15/2022. FINDINGS: Motion degraded exam. Brain: Mild generalized parenchymal atrophy. 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 There is no acute intracranial hemorrhage. No acute demarcated cortical infarct. No extra-axial fluid collection. No evidence of an intracranial mass. No evidence of hydrocephalus. No midline shift. Vascular: No hyperdense vessel.  Atherosclerotic calcifications. Skull: Normal. Negative for fracture or focal lesion. Sinuses/Orbits: Within the limitations of motion degradation, there is no appreciable acute orbital abnormality. Fluid level and frothy secretions within the left sphenoid sinus. Frothy secretions within the right sphenoid sinus. Mucosal thickening and/or fluid within the left ethmoid air cells. IMPRESSION: Motion degraded exam. No evidence of acute intracranial abnormality. Specifically, there is no hydrocephalus. Please refer to the recent prior brain  MRI 02/16/2022 for a description of small-volume debris layering within the lateral ventricle occipital horns at that time. Redemonstrated small chronic cortical/subcortical infarct within the anterior left fro

## 2022-02-23 NOTE — Progress Notes (Signed)
[facility] ADULT ICU REPLACEMENT PROTOCOL ? ? ?The patient does apply for the [facility] Adult ICU Electrolyte Replacment Protocol based on the criteria listed below:  ? ?1.Exclusion criteria: TCTS patients, ECMO patients, and Dialysis patients ?2. Is GFR >/= 30 ml/min? Yes.    ?Patient's GFR today is 50 ?3. Is SCr </= 2? Yes.   ?Patient's SCr is 1.26 mg/dL ?4. Did SCr increase >/= 0.5 in 24 hours? No. ?5.Pt's weight >40kg  Yes.   ?6. Abnormal electrolyte(s):   K 3.3  ?7. Electrolytes replaced per protocol ?8.  Call MD STAT for K+ </= 2.5, Phos </= 1, or Mag </= 1 ?Physician:  Bethann Humble ? Sedonia Small 02/23/2022 5:39 AM ? ?

## 2022-02-23 NOTE — Progress Notes (Signed)
We have reviewed the MRI of the cervical spine.  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  I do not see obvious signal change in the spinal cord in the cervical spine.  There is significant spinal stenosis in the mid cervical region. ? ?Given the fact that she has a flicker of movement in her hands and some sensation in her arms we feel that there is a chance she could regain some function of the upper extremities (I do not believe there is a high chance or a significant chance, but we if there may be a small chance of improvement in neurologic function of the upper extremities).  Therefore we have recommended decompressive laminectomy with instrumented fusion C2-C7.  We have spoken with the family at length.  I think the prognosis of the upper extremities is better than the lower extremities where she is complete and I do not believe that decompressive surgery in the thoracic or lumbar region will lead to improvement in neurologic function, therefore this will not be included in the surgery tomorrow.  ? ?They understand the risks of surgery include but are not limited to bleeding, infection, CSF leak, nerve root injury, spinal cord injury, numbness, weakness, paralysis, instrumentation failure, pseudoarthrosis, need for further surgery, evaluation of symptoms, worsening symptoms, misplaced instrumentation, need for further surgery, anesthesia risk including DVT pneumonia MI and death. ? ?I have talked to [facility] arrange transfer to [place] for planned surgery in the morning. spoken to the family at length and answered all of their questions to the best of my ability. ?

## 2022-02-23 NOTE — Progress Notes (Signed)
Patient transported to MRI and back without incident. Maintained vitals and saturation. RT will continue to monitor ?

## 2022-02-23 NOTE — Progress Notes (Deleted)
PCCM Progress Note ? ?MRI results reviewed and Neurosurgery, Dr. Ronnald Ramp was consulted. Family was updated regarding MRI results and pending consult to Neurosurgery consult.  ? ?Sodium levels continue to decreased for which D5W has been stopped and free water flushes have been decreased. For now will continue current dose of Desmopressin.  ? ?Gabrielle Prust D. Harris, NP-C ?[facility] ?Personal contact information can be found on Amion  ?02/23/2022, 5:03 PM ? ? ?

## 2022-02-23 NOTE — Consult Note (Signed)
Reason for Consult: Extensive epidural abscess ?Referring Physician: CCM ? ?Gabrielle Vincent is an 56 y.o. female.  ? ?HPI:  ?Unfortunate 57 year old female who was admitted with severe back pain and difficulty walking about a week ago.  She had an MRI done without contrast which was read as epidural lipomatosis.  Dr. Ellene Route was consulted and he recommended an epidural steroid, but she did not receive this because she became very sick and was intubated.  She became septic with E. coli.  She had meningitis and did a spinal tap which grew E. coli.  She had acute kidney injury and hypernatremia from diabetes insipidus.  Neurology was consulted.  Today she turned the corner from a mental status standpoint and an MRI of the thoracic and lumbar spine with and without contrast was ordered and this showed diffuse epidural abscess with cord compression and likely signal change in the cord and neurosurgical evaluation was once again requested.  Dr. Ellene Route asked [place] to manage this this evening, and I have discussed this case with him extensively.  She is on Rocephin.  Sepsis has improved.  Diabetes insipidus has improved.  She did spike her white count once again to 19.  The nurses taking care of her since Monday state that she has not moved her extremities since that time. ? ?Past Medical History:  ?Diagnosis Date  ? Aortic atherosclerosis ([place]) 02/14/2022  ? Class II obesity 02/14/2022  ? Diverticulosis 02/14/2022  ? Hepatic steatosis 02/14/2022  ? Hyperlipidemia 02/07/2016  ? Seasonal allergies   ? Swelling of lower extremity   ? bilateral  ? Varicose veins   ? right leg  ?  ?Past Surgical History:  ?Procedure Laterality Date  ? lymphnode drainage surgery    ?  ?Allergies  ?Allergen Reactions  ? Erythromycin Anaphylaxis  ? Penicillins Nausea Only  ? Prednisone Other (See Comments)  ?  Lymph node swelling  ?  ?Social History  ? ?Tobacco Use  ? Smoking status: Never  ? Smokeless tobacco: Not on file  ?Substance Use Topics  ? Alcohol use:  No  ?  ?History reviewed. No pertinent family history. ?  ?Review of Systems ? ?Positive ROS: Unable to obtain ? ?All other systems have been reviewed and were otherwise negative with the exception of those mentioned in the HPI and as above. ? ?Objective: ?Vital signs in last 24 hours: ?Temp:  [99.9 ?F (37.7 ?C)-101.7 ?F (38.7 ?C)] 100 ?F (37.8 ?C) (05/12 1625) ?Pulse Rate:  [70-99] 97 (05/12 1700) ?Resp:  [16-27] 24 (05/12 1700) ?BP: (117-199)/(47-82) 140/57 (05/12 1700) ?SpO2:  [95 %-100 %] 100 % (05/12 1700) ?FiO2 (%):  [30 %] 30 % (05/12 1600) ?Weight:  [111.1 kg] 111.1 kg (05/12 0500) ? ?General Appearance: Awakens to verbal stimulation and answer simple yes/no questions with head nodding, follows simple commands such as shoulder shrug or looking left and right ?Head: Normocephalic, without obvious abnormality, atraumatic ?Eyes: PERRL, conjunctiva/corneas clear, EOM's intact   ?Throat: Intubated ?Neck: Slightly tender to palpation as she winces to palpation ?Lungs:  respirations unlabored ?Heart: Regular rate and rhythm ?Abdomen: Soft ? ?NEUROLOGIC:  ? ?Mental status: Unable to test speech which she does understand things,, but her test fund of knowledge or memory ?Motor Exam -0 out of 5 in all muscle groups of the upper and lower extremities except for 1 out of 5 finger and grip right greater than left ?Sensory Exam -difficult to assess though she does grimace to pinching her feet ?Reflexes: Areflexic ?Coordination -unable  to test ?Gait -able to test ?Balance -unable to test ?Cranial Nerves: ?I: smell Not tested  ?II: visual acuity  OS: na    OD: na  ?II: visual fields   ?II: pupils   ?III,VII: ptosis   ?III,IV,VI: extraocular muscles    ?V: mastication   ?V: facial light touch sensation    ?V,VII: corneal reflex    ?VII: facial muscle function - upper    ?VII: facial muscle function - lower   ?VIII: hearing   ?IX: soft palate elevation    ?IX,X: gag reflex   ?XI: trapezius strength  Intact  ?XI:  sternocleidomastoid strength   ?XI: neck flexion strength    ?XII: tongue strength    ? ? ?Data Review ?Lab Results  ?Component Value Date  ? WBC 15.5 (H) 02/23/2022  ? HGB 11.0 (L) 02/23/2022  ? HCT 35.6 (L) 02/23/2022  ? MCV 93.2 02/23/2022  ? PLT 181 02/23/2022  ? ?Lab Results  ?Component Value Date  ? NA 143 02/23/2022  ? K 3.3 (L) 02/23/2022  ? CL 123 (H) 02/23/2022  ? CO2 18 (L) 02/23/2022  ? BUN 34 (H) 02/23/2022  ? CREATININE 1.26 (H) 02/23/2022  ? GLUCOSE 191 (H) 02/23/2022  ? ?Lab Results  ?Component Value Date  ? INR 1.3 (H) 02/15/2022  ? ? ?Radiology: DG Abd 1 View ? ?Result Date: 02/19/2022 ?CLINICAL DATA:  Orogastric tube placement EXAM: ABDOMEN - 1 VIEW COMPARISON:  02/18/2022 FINDINGS: Limited radiograph of the lower chest and upper abdomen was obtained for the purposes of enteric tube localization. Enteric tube is seen coursing below the diaphragm with distal tip and side port terminating within the expected location of the gastric body. IMPRESSION: Enteric tube tip and side port positioned within the gastric body. Electronically Signed   By: Davina Poke D.O.   On: 02/19/2022 14:32  ? ?EEG adult ? ?Result Date: 02/19/2022 ?Lora Havens, MD     02/20/2022  8:00 AM Patient Name: Gabrielle Vincent MRN: [id number] Epilepsy Attending: Lora Havens Referring Physician/Provider: Marshell Garfinkel, MD Date: 02/19/2022 Duration: 23.25 mins Patient history: 56yo female with altered mental status.  EEG to evaluate for seizure. Level of alertness:  lethargic AEDs during EEG study: Propofol Technical aspects: This EEG study was done with scalp electrodes positioned according to the 10-20 International system of electrode placement. Electrical activity was acquired at a sampling rate of '500Hz'$  and reviewed with a high frequency filter of '70Hz'$  and a low frequency filter of '1Hz'$ . EEG data were recorded continuously and digitally stored. Description: EEG showed continuous generalized 3-'5Hz'$  theta-delta slowing.   Hyperventilation and photic stimulation were not performed.    ABNORMALITY - Continuous slow, generalized  IMPRESSION: This study is suggestive of moderate to severe diffuse encephalopathy, nonspecific etiology. No seizures or epileptiform discharges were seen throughout the recording. Priyanka Barbra Sarks  ? ? ? ?Assessment/Plan: ?Estimated body mass index is 42.04 kg/m? as calculated from the following: ?  Height as of this encounter: '5\' 4"'$  (1.626 m). ?  Weight as of this encounter: 111.1 kg. ? ? ?Very difficult complex situation and we have spent the last hour and a half with the patient and with the family talking about this situation.  Discussed the case with Dr. Ellene Route who did her consult as well as with Dr. Marcello Moores who is my backup. ? ?Unfortunately she has had no movement noted in the arms or the legs since Monday according to the nurses that have been taking care  of her all week.  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  Again, she has been this way for at least 48 hours and likely for up to 5 days.  She has massive epidural abscess burden from T1 down into the sacrum and likely extends into the cervical spine.  Cervical spine MRI has been ordered and she is going down for this imaging soon. Unfortunately they cannot give her more contrast because of her kidney function. ? ?At this point I am not sure there is a role for surgery, in that she has a known bacteria so it will not help with diagnosis, and she has a nearly complete spinal cord injury for up to 5 days and therefore is extremely unlikely to regain any spinal cord function despite decompression.  Surgery could potentially reduce the burden of pus, but the critical care medicine physician told me that she is responding to the antibiotics from a mental status standpoint and notes the fact that she is no longer septic.  She appears to be  turning the corner therefore, I am not sure that reducing bacterial burden with decompressive surgery offers more benefit than risk. ? ?I have spent considerable time with the family discussing the situation and the ty

## 2022-02-23 NOTE — Progress Notes (Addendum)
Notified e-link that the pt's last sodium level was 147. Would the provider like the current tx changed (D5% running at 62 currently)?  ? ?See new orders for decreased desmopressin to 1 mcg q4 times daily.  ?

## 2022-02-23 NOTE — Care Plan (Signed)
Unable to get pt down for MRI first thing this am. Pt family has questions regarding Contrast administration and current GFR levels. Once this has been cleared up with family MRI will attempt to get pt down for scan.  ?

## 2022-02-23 NOTE — Progress Notes (Signed)
PCCM Progress Note ? ?MRI results reviewed and Neurosurgery, Dr. Ronnald Ramp was consulted. Family was updated regarding MRI results and pending consult to Neurosurgery consult.  ? ?Repeat physical exam  ?General: Acute on chronically ill appearing middle aged female lying in bed on mechanical ventilation, in NAD ?HEENT: ETT, MM pink/moist, PERRL,  ?Neuro: Patient more alert and interactive on sedation but is able to track movement in room, she nods head to questions of sensation to lower extremities but response seemed inconsistent, no purposeful or spontaneous movement seen to lower extremities  ?CV: s1s2 regular rate and rhythm, no murmur, rubs, or gallops,  ?PULM:  Clear to ascultation, no increased work of breathing, tolerating vent well ?GI: soft, bowel sounds active in all 4 quadrants, non-tender, non-distended, tolerating TF ?Extremities: warm/dry, no edema  ?Skin: no rashes or lesions ? ?Sodium levels continue to decreased for which D5W has been stopped and free water flushes have been decreased. For now will continue current dose of Desmopressin.  ? ?Gabrielle Toro D. Harris, NP-C ?[facility] ?Personal contact information can be found on Amion  ?02/23/2022, 5:20 PM ? ? ?

## 2022-02-23 NOTE — Progress Notes (Addendum)
Called e-link to notify of hypotension (rechecked x2 current 95/50). Request doc evaluate and possible bolus? Will continue with current plan of care.  ? ?Per e-link, continue to monitor and notify of any further changes/hypotension.  ?

## 2022-02-24 ENCOUNTER — Inpatient Hospital Stay (HOSPITAL_COMMUNITY): Payer: BC Managed Care – PPO

## 2022-02-24 ENCOUNTER — Inpatient Hospital Stay (HOSPITAL_COMMUNITY): Payer: BC Managed Care – PPO | Admitting: Certified Registered"

## 2022-02-24 ENCOUNTER — Encounter (HOSPITAL_COMMUNITY)
Admission: EM | Disposition: A | Discharge status: Other Acute Inpatient Hospital | Payer: Self-pay | Source: Home / Self Care | Attending: Internal Medicine

## 2022-02-24 DIAGNOSIS — N10 Acute tubulo-interstitial nephritis: Secondary | ICD-10-CM | POA: Diagnosis not present

## 2022-02-24 DIAGNOSIS — G061 Intraspinal abscess and granuloma: Secondary | ICD-10-CM

## 2022-02-24 DIAGNOSIS — N390 Urinary tract infection, site not specified: Secondary | ICD-10-CM | POA: Diagnosis not present

## 2022-02-24 DIAGNOSIS — B9689 Other specified bacterial agents as the cause of diseases classified elsewhere: Secondary | ICD-10-CM

## 2022-02-24 DIAGNOSIS — N179 Acute kidney failure, unspecified: Secondary | ICD-10-CM | POA: Diagnosis not present

## 2022-02-24 DIAGNOSIS — A419 Sepsis, unspecified organism: Secondary | ICD-10-CM | POA: Diagnosis not present

## 2022-02-24 DIAGNOSIS — Z9889 Other specified postprocedural states: Secondary | ICD-10-CM

## 2022-02-24 DIAGNOSIS — G9340 Encephalopathy, unspecified: Secondary | ICD-10-CM | POA: Diagnosis not present

## 2022-02-24 HISTORY — PX: POSTERIOR CERVICAL FUSION/FORAMINOTOMY: SHX5038

## 2022-02-24 LAB — BASIC METABOLIC PANEL
Anion gap: 6 (ref 5–15)
BUN: 31 mg/dL — ABNORMAL HIGH (ref 6–20)
CO2: 17 mmol/L — ABNORMAL LOW (ref 22–32)
Calcium: 7.8 mg/dL — ABNORMAL LOW (ref 8.9–10.3)
Chloride: 121 mmol/L — ABNORMAL HIGH (ref 98–111)
Creatinine, Ser: 1.15 mg/dL — ABNORMAL HIGH (ref 0.44–1.00)
GFR, Estimated: 56 mL/min — ABNORMAL LOW (ref >60)
Glucose, Bld: 132 mg/dL — ABNORMAL HIGH (ref 70–99)
Potassium: 3.8 mmol/L (ref 3.5–5.1)
Sodium: 144 mmol/L (ref 135–145)

## 2022-02-24 LAB — GLUCOSE, CAPILLARY
Glucose-Capillary: 117 mg/dL — ABNORMAL HIGH (ref 70–99)
Glucose-Capillary: 118 mg/dL — ABNORMAL HIGH (ref 70–99)
Glucose-Capillary: 121 mg/dL — ABNORMAL HIGH (ref 70–99)
Glucose-Capillary: 126 mg/dL — ABNORMAL HIGH (ref 70–99)
Glucose-Capillary: 129 mg/dL — ABNORMAL HIGH (ref 70–99)
Glucose-Capillary: 130 mg/dL — ABNORMAL HIGH (ref 70–99)
Glucose-Capillary: 134 mg/dL — ABNORMAL HIGH (ref 70–99)
Glucose-Capillary: 137 mg/dL — ABNORMAL HIGH (ref 70–99)
Glucose-Capillary: 137 mg/dL — ABNORMAL HIGH (ref 70–99)

## 2022-02-24 LAB — CBC
HCT: 33.5 % — ABNORMAL LOW (ref 36.0–46.0)
Hemoglobin: 10.6 g/dL — ABNORMAL LOW (ref 12.0–15.0)
MCH: 29 pg (ref 26.0–34.0)
MCHC: 31.6 g/dL (ref 30.0–36.0)
MCV: 91.5 fL (ref 80.0–100.0)
Platelets: 179 10*3/uL (ref 150–400)
RBC: 3.66 MIL/uL — ABNORMAL LOW (ref 3.87–5.11)
RDW: 16.8 % — ABNORMAL HIGH (ref 11.5–15.5)
WBC: 14.1 10*3/uL — ABNORMAL HIGH (ref 4.0–10.5)
nRBC: 0 % (ref 0.0–0.2)

## 2022-02-24 LAB — SODIUM
Sodium: 144 mmol/L (ref 135–145)
Sodium: 146 mmol/L — ABNORMAL HIGH (ref 135–145)
Sodium: 147 mmol/L — ABNORMAL HIGH (ref 135–145)

## 2022-02-24 LAB — PREPARE RBC (CROSSMATCH)

## 2022-02-24 LAB — MRSA NEXT GEN BY PCR, NASAL: MRSA by PCR Next Gen: NOT DETECTED

## 2022-02-24 LAB — ABO/RH: ABO/RH(D): O POS

## 2022-02-24 IMAGING — RF DG CERVICAL SPINE 2 OR 3 VIEWS
1 series · 2 of 2 positions shown · non-contrast
Comparison: None Available.

FLUOROSCOPY:
Air kerma 2.55 mGy

CLINICAL DATA: Posterior cervical laminectomy

EXAM:
CERVICAL SPINE - 2-3 VIEW

[Series 1: run · 2 of 2 slices shown]
[im 1/2]
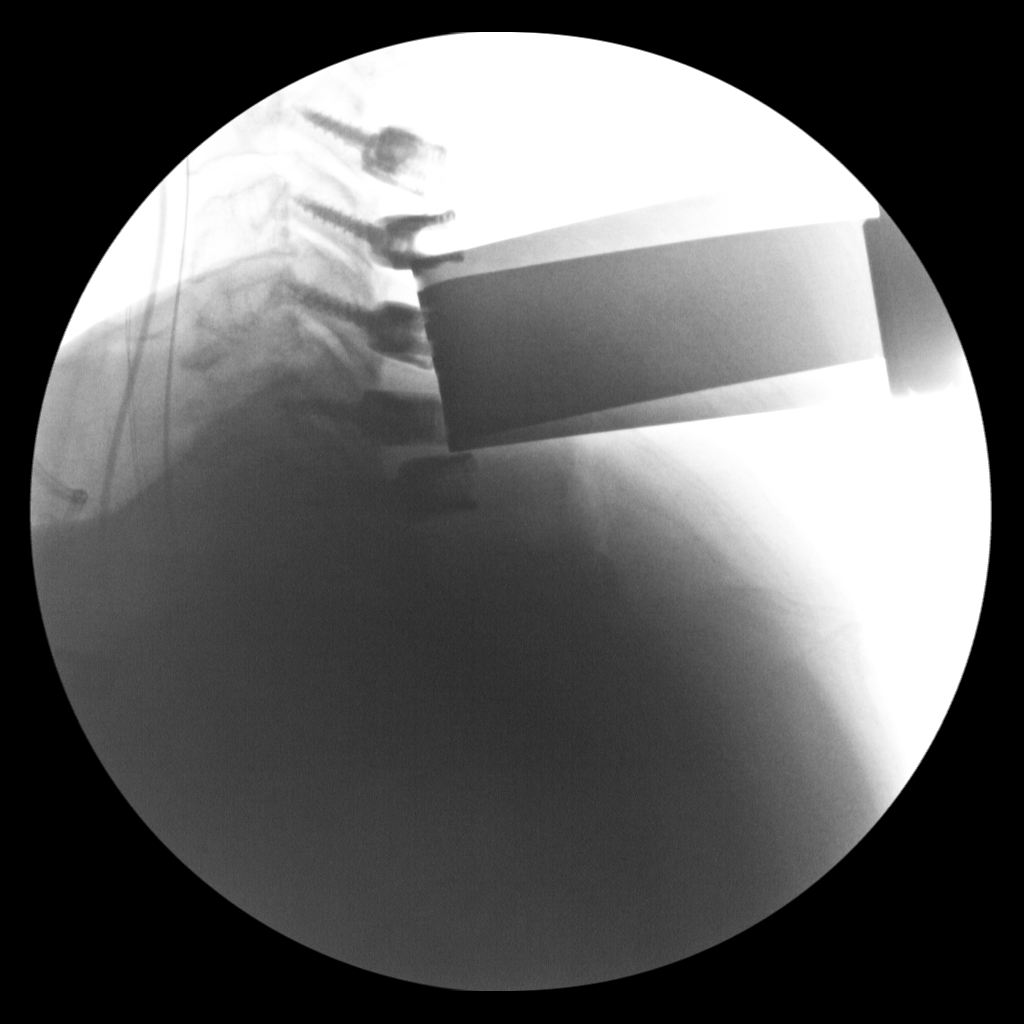
[im 2/2]
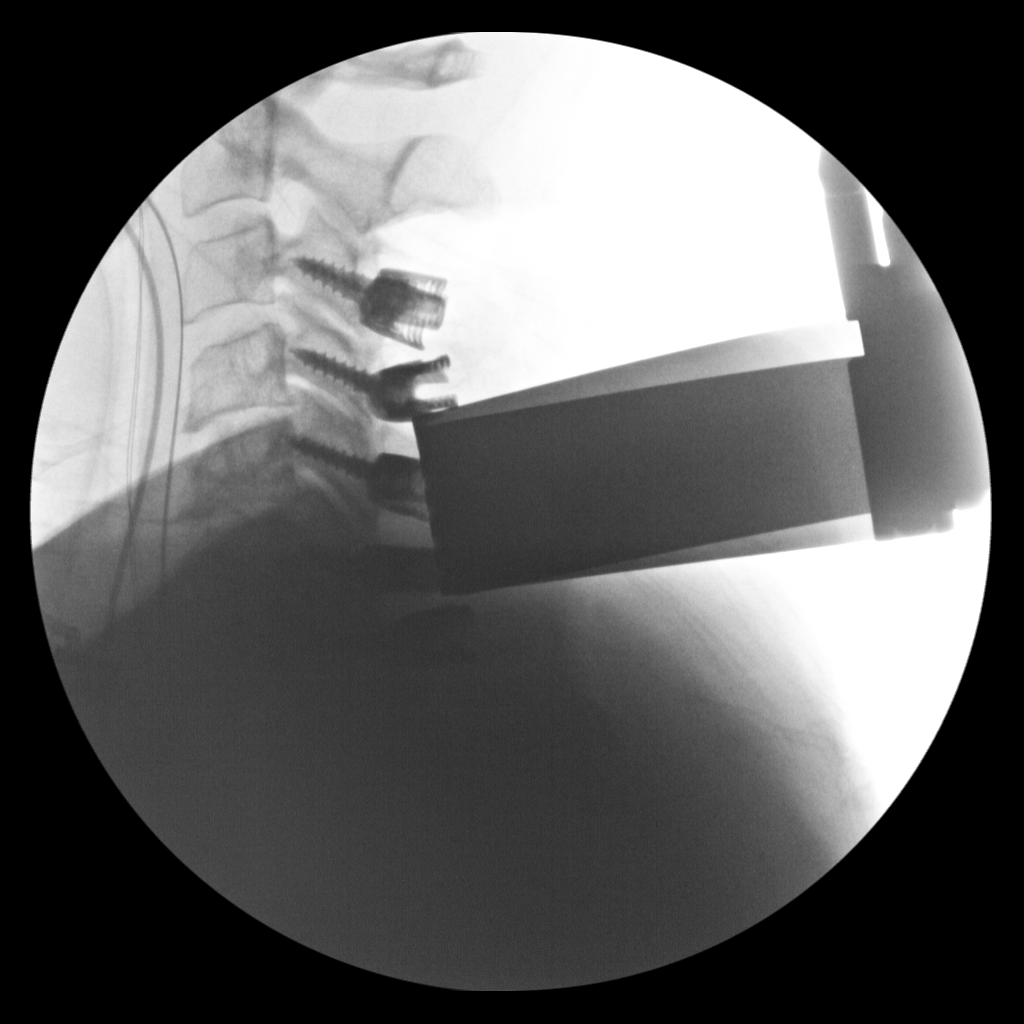

[2 of 2 positions shown; findings below may reference images not displayed]

FINDINGS: Intraoperative lateral fluoroscopic images of the cervical spine
demonstrate posterior laminectomy of C3 through C7 with pedicle
screws in position and overlying retractors. No obvious perihardware
fracture or component malpositioning.
IMPRESSION: Intraoperative lateral fluoroscopic images demonstrate posterior
laminectomy of C3 through C7 with pedicle screws in position and
overlying retractors.

## 2022-02-24 SURGERY — POSTERIOR CERVICAL FUSION/FORAMINOTOMY LEVEL 4
Anesthesia: General | Site: Neck

## 2022-02-24 MED ORDER — THROMBIN 20000 UNITS EX SOLR
CUTANEOUS | Status: DC | PRN
  Administered 2022-02-24: 20 mL via TOPICAL

## 2022-02-24 MED ORDER — THROMBIN 20000 UNITS EX SOLR
CUTANEOUS | Status: AC
Start: 2022-02-24 — End: ?
  Filled 2022-02-24: qty 20000

## 2022-02-24 MED ORDER — PROPOFOL 10 MG/ML IV BOLUS
INTRAVENOUS | Status: AC
  Filled 2022-02-24: qty 20

## 2022-02-24 MED ORDER — POTASSIUM CHLORIDE IN NACL 20-0.9 MEQ/L-% IV SOLN
INTRAVENOUS | Status: DC
  Filled 2022-02-24 (×2): qty 1000

## 2022-02-24 MED ORDER — MORPHINE SULFATE (PF) 2 MG/ML IV SOLN: 2.0000 mg | INTRAVENOUS | Status: DC | PRN

## 2022-02-24 MED ORDER — SUFENTANIL CITRATE 50 MCG/ML IV SOLN
INTRAVENOUS | Status: AC
  Filled 2022-02-24: qty 1

## 2022-02-24 MED ORDER — PANTOPRAZOLE 2 MG/ML SUSPENSION
40.0000 mg | Freq: Every day | ORAL | Status: DC
Start: 2022-02-24 — End: 2022-03-21
  Administered 2022-02-24 – 2022-03-21 (×25): 40 mg
  Filled 2022-02-24 (×25): qty 20

## 2022-02-24 MED ORDER — THROMBIN 5000 UNITS EX SOLR
CUTANEOUS | Status: AC
  Filled 2022-02-24: qty 5000

## 2022-02-24 MED ORDER — PROSOURCE TF PO LIQD
45.0000 mL | Freq: Three times a day (TID) | ORAL | Status: DC
  Administered 2022-02-24 – 2022-02-28 (×14): 45 mL
  Filled 2022-02-24 (×14): qty 45

## 2022-02-24 MED ORDER — FENTANYL 2500MCG IN NS 250ML (10MCG/ML) PREMIX INFUSION
INTRAVENOUS | Status: AC
  Filled 2022-02-24: qty 250

## 2022-02-24 MED ORDER — BUPIVACAINE HCL (PF) 0.25 % IJ SOLN
INTRAMUSCULAR | Status: AC
  Filled 2022-02-24: qty 30

## 2022-02-24 MED ORDER — VANCOMYCIN HCL 1000 MG IV SOLR
INTRAVENOUS | Status: AC
Start: 2022-02-24 — End: ?
  Filled 2022-02-24: qty 20

## 2022-02-24 MED ORDER — OSMOLITE 1.2 CAL PO LIQD
1000.0000 mL | ORAL | Status: DC
  Administered 2022-02-24 – 2022-02-28 (×4): 1000 mL

## 2022-02-24 MED ORDER — THROMBIN 5000 UNITS EX SOLR
OROMUCOSAL | Status: DC | PRN
  Administered 2022-02-24: 5 mL via TOPICAL

## 2022-02-24 MED ORDER — SODIUM CHLORIDE 0.9% FLUSH
3.0000 mL | Freq: Two times a day (BID) | INTRAVENOUS | Status: DC
  Administered 2022-02-24 – 2022-03-02 (×9): 3 mL via INTRAVENOUS

## 2022-02-24 MED ORDER — PHENYLEPHRINE HCL-NACL 20-0.9 MG/250ML-% IV SOLN
INTRAVENOUS | Status: DC | PRN
  Administered 2022-02-24: 25 ug/min via INTRAVENOUS

## 2022-02-24 MED ORDER — LACTATED RINGERS IV SOLN
INTRAVENOUS | Status: DC | PRN
Start: 2022-02-24 — End: 2022-02-24

## 2022-02-24 MED ORDER — 0.9 % SODIUM CHLORIDE (POUR BTL) OPTIME
TOPICAL | Status: DC | PRN
  Administered 2022-02-24: 1000 mL

## 2022-02-24 MED ORDER — SODIUM CHLORIDE 0.9 % IV SOLN
10.0000 mL/h | Freq: Once | INTRAVENOUS | Status: AC
  Administered 2022-02-26: 10 mL/h via INTRAVENOUS

## 2022-02-24 MED ORDER — ROCURONIUM 10MG/ML (10ML) SYRINGE FOR MEDFUSION PUMP - OPTIME
INTRAVENOUS | Status: DC | PRN
  Administered 2022-02-24 (×2): 100 mg via INTRAVENOUS

## 2022-02-24 MED ORDER — INSULIN ASPART 100 UNIT/ML IJ SOLN
INTRAMUSCULAR | Status: DC | PRN
  Administered 2022-02-24: 2 [IU] via SUBCUTANEOUS

## 2022-02-24 MED ORDER — BUPIVACAINE HCL (PF) 0.25 % IJ SOLN
INTRAMUSCULAR | Status: DC | PRN
  Administered 2022-02-24: 8 mL

## 2022-02-24 MED ORDER — BACITRACIN ZINC 500 UNIT/GM EX OINT
TOPICAL_OINTMENT | CUTANEOUS | Status: AC
  Filled 2022-02-24: qty 28.35

## 2022-02-24 MED ORDER — MIDAZOLAM HCL 2 MG/2ML IJ SOLN
INTRAMUSCULAR | Status: AC
  Filled 2022-02-24: qty 2

## 2022-02-24 MED ORDER — SODIUM CHLORIDE 0.9% FLUSH: 3.0000 mL | INTRAVENOUS | Status: DC | PRN

## 2022-02-24 MED ORDER — PHENYLEPHRINE HCL (PRESSORS) 10 MG/ML IV SOLN
INTRAVENOUS | Status: DC | PRN
  Administered 2022-02-24: 80 ug via INTRAVENOUS

## 2022-02-24 MED ORDER — ACETAMINOPHEN 325 MG PO TABS
650.0000 mg | ORAL_TABLET | ORAL | Status: DC | PRN
  Filled 2022-02-24: qty 2

## 2022-02-24 MED ORDER — SODIUM CHLORIDE 0.9 % IV SOLN
250.0000 mL | INTRAVENOUS | Status: DC
  Administered 2022-02-27: 250 mL via INTRAVENOUS

## 2022-02-24 MED ORDER — ACETAMINOPHEN 650 MG RE SUPP: 650.0000 mg | RECTAL | Status: DC | PRN

## 2022-02-24 MED ORDER — INSULIN REGULAR(HUMAN) IN NACL 100-0.9 UT/100ML-% IV SOLN
INTRAVENOUS | Status: DC
  Filled 2022-02-24: qty 100

## 2022-02-24 SURGICAL SUPPLY — 66 items
ADH SKN CLS APL DERMABOND .7 (GAUZE/BANDAGES/DRESSINGS) ×1
APL SKNCLS STERI-STRIP NONHPOA (GAUZE/BANDAGES/DRESSINGS) ×1
BAG COUNTER SPONGE SURGICOUNT (BAG) ×3 IMPLANT
BAG SPNG CNTER NS LX DISP (BAG) ×1
BASKET BONE COLLECTION (BASKET) ×1 IMPLANT
BENZOIN TINCTURE PRP APPL 2/3 (GAUZE/BANDAGES/DRESSINGS) ×3 IMPLANT
BLADE BONE MILL FINE (MISCELLANEOUS) ×2
BLADE CLIPPER SURG (BLADE) IMPLANT
BLADE MILL BN FN STRL DISP (MISCELLANEOUS) IMPLANT
BUR CARBIDE MATCH 3.0 (BURR) ×3 IMPLANT
CANISTER SUCT 3000ML PPV (MISCELLANEOUS) ×3 IMPLANT
DERMABOND ADVANCED (GAUZE/BANDAGES/DRESSINGS) ×1
DERMABOND ADVANCED .7 DNX12 (GAUZE/BANDAGES/DRESSINGS) IMPLANT
DRAPE C-ARM 42X72 X-RAY (DRAPES) ×6 IMPLANT
DRAPE LAPAROTOMY 100X72 PEDS (DRAPES) ×3 IMPLANT
DRSG OPSITE POSTOP 4X6 (GAUZE/BANDAGES/DRESSINGS) ×1 IMPLANT
DURAPREP 6ML APPLICATOR 50/CS (WOUND CARE) ×3 IMPLANT
ELECT REM PT RETURN 9FT ADLT (ELECTROSURGICAL) ×2
ELECTRODE REM PT RTRN 9FT ADLT (ELECTROSURGICAL) ×2 IMPLANT
EVACUATOR 1/8 PVC DRAIN (DRAIN) IMPLANT
GAUZE 4X4 16PLY ~~LOC~~+RFID DBL (SPONGE) IMPLANT
GAUZE SPONGE 4X4 12PLY STRL (GAUZE/BANDAGES/DRESSINGS) ×3 IMPLANT
GLOVE BIO SURGEON STRL SZ7 (GLOVE) ×6 IMPLANT
GLOVE BIO SURGEON STRL SZ8 (GLOVE) ×4 IMPLANT
GLOVE BIOGEL PI IND STRL 7.0 (GLOVE) IMPLANT
GLOVE BIOGEL PI IND STRL 7.5 (GLOVE) IMPLANT
GLOVE BIOGEL PI INDICATOR 7.0 (GLOVE) ×1
GLOVE BIOGEL PI INDICATOR 7.5 (GLOVE) ×2
GLOVE EXAM NITRILE XL STR (GLOVE) IMPLANT
GOWN STRL REUS W/ TWL LRG LVL3 (GOWN DISPOSABLE) IMPLANT
GOWN STRL REUS W/ TWL XL LVL3 (GOWN DISPOSABLE) ×2 IMPLANT
GOWN STRL REUS W/TWL 2XL LVL3 (GOWN DISPOSABLE) IMPLANT
GOWN STRL REUS W/TWL LRG LVL3 (GOWN DISPOSABLE) ×4
GOWN STRL REUS W/TWL XL LVL3 (GOWN DISPOSABLE) ×2
GRAFT BONE PROTEIOS LRG 5CC (Orthopedic Implant) ×1 IMPLANT
HEMOSTAT POWDER KIT SURGIFOAM (HEMOSTASIS) IMPLANT
KIT BASIN OR (CUSTOM PROCEDURE TRAY) ×3 IMPLANT
KIT TURNOVER KIT B (KITS) ×3 IMPLANT
MARKER SKIN DUAL TIP RULER LAB (MISCELLANEOUS) ×3 IMPLANT
MATRIX SPINE STRIP NEOCORE 5CC (Putty) IMPLANT
NDL HYPO 18GX1.5 BLUNT FILL (NEEDLE) IMPLANT
NDL HYPO 25X1 1.5 SAFETY (NEEDLE) ×2 IMPLANT
NDL SPNL 20GX3.5 QUINCKE YW (NEEDLE) ×2 IMPLANT
NEEDLE HYPO 18GX1.5 BLUNT FILL (NEEDLE) IMPLANT
NEEDLE HYPO 25X1 1.5 SAFETY (NEEDLE) ×2 IMPLANT
NEEDLE SPNL 20GX3.5 QUINCKE YW (NEEDLE) ×2 IMPLANT
NS IRRIG 1000ML POUR BTL (IV SOLUTION) ×3 IMPLANT
PACK LAMINECTOMY NEURO (CUSTOM PROCEDURE TRAY) ×3 IMPLANT
PIN MAYFIELD SKULL DISP (PIN) ×3 IMPLANT
ROD LORD TI 3.5X65 (Rod) ×2 IMPLANT
SCREW PA 3.5X16 (Screw) ×6 IMPLANT
SCREW POLYAXIAL 3.5 X 14 (Screw) ×4 IMPLANT
SCREW SET ATEC (Screw) ×10 IMPLANT
SPONGE SURGIFOAM ABS GEL 100 (HEMOSTASIS) ×3 IMPLANT
STRIP CLOSURE SKIN 1/2X4 (GAUZE/BANDAGES/DRESSINGS) ×3 IMPLANT
STRIP MATRIX NEOCORE 5CC (Putty) ×1 IMPLANT
SUT VIC AB 0 CT1 18XCR BRD8 (SUTURE) ×2 IMPLANT
SUT VIC AB 0 CT1 8-18 (SUTURE) ×4
SUT VIC AB 2-0 CP2 18 (SUTURE) ×4 IMPLANT
SUT VIC AB 3-0 SH 8-18 (SUTURE) ×2 IMPLANT
TOWEL GREEN STERILE (TOWEL DISPOSABLE) ×3 IMPLANT
TOWEL GREEN STERILE FF (TOWEL DISPOSABLE) ×3 IMPLANT
TRAY FOLEY MTR SLVR 16FR STAT (SET/KITS/TRAYS/PACK) IMPLANT
TUBE FEEDING ENTERAL 5FR 16IN (TUBING) ×1 IMPLANT
UNDERPAD 30X36 HEAVY ABSORB (UNDERPADS AND DIAPERS) ×3 IMPLANT
WATER STERILE IRR 1000ML POUR (IV SOLUTION) ×3 IMPLANT

## 2022-02-24 NOTE — Transfer of Care (Signed)
Immediate Anesthesia Transfer of Care Note ? ?Patient: Gabrielle Vincent ? ?Procedure(s) Performed: POSTERIOR CERVICAL LAMINECTOMY CERVICAL THREE-CERVICAL SEVEN WITH LATERAL MASS FUSION / FIXATION (Neck) ? ?Patient Location: ICU ? ?Anesthesia Type:General ? ?Level of Consciousness: unresponsive and Patient remains intubated per anesthesia plan ? ?Airway & Oxygen Therapy: Patient remains intubated per anesthesia plan and Patient placed on Ventilator (see vital sign flow sheet for setting) ? ?Post-op Assessment: Report given to RN and Post -op Vital signs reviewed and stable ? ?Post vital signs: Reviewed and stable ? ?Last Vitals:  ?Vitals Value Taken Time  ?BP 87/70 02/24/22 1243  ?Temp    ?Pulse 72 02/24/22 1252  ?Resp 23 02/24/22 1252  ?SpO2 98 % 02/24/22 1252  ?Vitals shown include unvalidated device data. ? ?Last Pain:  ?Vitals:  ? 02/24/22 0800  ?TempSrc: Axillary  ?PainSc:   ?   ? ?Patients Stated Pain Goal: 7 (02/16/22 0415) ? ?Complications: No notable events documented. ?

## 2022-02-24 NOTE — Anesthesia Procedure Notes (Signed)
Arterial Line Insertion ?Start/End5/13/2023 8:45 AM, 02/24/2022 8:46 AM ?Performed by: Leonor Liv, CRNA, CRNA ? Patient location: OR. ?Patient sedated ?Left, radial was placed ?Catheter size: 20 G ?Hand hygiene performed  and maximum sterile barriers used  ? ?Attempts: 1 ?Procedure performed without using ultrasound guided technique. ?Following insertion, dressing applied and Biopatch. ?Post procedure assessment: normal ? ?Patient tolerated the procedure well with no immediate complications. ? ? ?

## 2022-02-24 NOTE — Anesthesia Postprocedure Evaluation (Signed)
Anesthesia Post Note ? ?Patient: Shelbe Haglund ? ?Procedure(s) Performed: POSTERIOR CERVICAL LAMINECTOMY CERVICAL THREE-CERVICAL SEVEN WITH LATERAL MASS FUSION / FIXATION (Neck) ? ?  ? ?Patient location during evaluation: ICU ?Anesthesia Type: General ?Level of consciousness: sedated and patient remains intubated per anesthesia plan ?Pain management: pain level controlled ?Vital Signs Assessment: post-procedure vital signs reviewed and stable ?Respiratory status: patient remains intubated per anesthesia plan and patient on ventilator - see flowsheet for VS ?Cardiovascular status: stable ?Postop Assessment: no apparent nausea or vomiting ?Anesthetic complications: no ? ? ?No notable events documented. ? ?Last Vitals:  ?Vitals:  ? 02/24/22 0700 02/24/22 0800  ?BP: (!) 120/59 128/68  ?Pulse: 71 78  ?Resp: (!) 25 (!) 24  ?Temp:  37.1 ?C  ?SpO2: 96% 96%  ?  ?Last Pain:  ?Vitals:  ? 02/24/22 0800  ?TempSrc: Axillary  ?PainSc:   ? ? ?  ?  ?  ?  ?  ?  ? ?Kaylie Ritter,E. Sokhna Christoph ? ? ? ? ?

## 2022-02-24 NOTE — Progress Notes (Signed)
Pt remains off the floor at this time. RT will obtain routine vent check when pt arrives to unit.  ?

## 2022-02-24 NOTE — Progress Notes (Signed)
Brief Nutrition Note ? ?Consult received for enteral/tube feeding initiation and management as well as assessment of nutrition requirements/status. ? ?Pt last seen by RD for follow-up on 02/20/22 at [facility]. At that time, tube feeding order was for Osmolite 1.2 to advance by 10 ml q 8 hours to goal rate of 60 ml/hr with ProSource TF 45 ml TID. Noted pt reached rate of 50 ml/hr on 02/21/22 at 0559. Osmolite 1.2 tube feeding order was later discontinued that same day at 1007. ? ?Pt did not receive tube feeds on 02/22/22 per review of Meds History. Noted Vital High Protein was ordered at rate of 20 ml/hr on 02/23/22. This order was d/c at 1938 that same day. ? ?Pt has not received tube feeds since order for Vital High Protein @ 20 ml/hr was discontinued on 02/23/22. ? ?RD will reorder tube feeds from last RD note on 02/20/22 with orders to advance to goal: ?- Start Osmolite 1.2 @ 20 ml/hr and advance by 10 ml q 8 hours to goal rate of 60 ml/hr (1440 ml/day) ?- ProSource TF 45 ml TID ? ?Tube feeding regimen at goal rate provides 1848 kcal, 113 grams of protein, and 1181 ml of H2O.  ? ?Pt currently with free water flushes of 200 ml q 4 hours ordered for hypernatremia. ? ?Full follow-up assessment to follow. ? ?OG tube in place with tip located in gastric body per x-ray imaging on 5/08. More recent x-ray imaging is unavailable. ? ?Admitting Dx: Sepsis secondary to UTI ([place]) [A41.9, N39.0] ?H/O excision of lamina of cervical vertebra for decompression of spinal cord [Z98.890] ? ?Labs: ? ?Recent Labs  ?Lab 02/20/22 ?1602 02/21/22 ?0000 02/21/22 ?0405 02/21/22 ?0809 02/22/22 ?0354 02/22/22 ?6568 02/23/22 ?1275 02/23/22 ?0820 02/24/22 ?0303 02/24/22 ?1700 02/24/22 ?1317  ?NA  --    < > 161*   < > 158*   < > 147*   < > 144 144 146*  ?K  --   --  3.5  --  3.7  --  3.3*  --   --  3.8  --   ?CL  --   --  <130*  --  >130*  --  123*  --   --  121*  --   ?CO2  --   --  18*  --  19*  --  18*  --   --  17*  --   ?BUN  --   --  35*  --   39*  --  34*  --   --  31*  --   ?CREATININE  --   --  1.54*  --  1.46*  --  1.26*  --   --  1.15*  --   ?CALCIUM  --   --  8.3*  --  8.2*  --  7.9*  --   --  7.8*  --   ?MG 2.1  --  2.2  --  2.3  --   --   --   --   --   --   ?PHOS 2.9  --  3.1  --  4.2  --   --   --   --   --   --   ?GLUCOSE  --   --  287*  --  151*  --  191*  --   --  132*  --   ? < > = values in this interval not displayed.  ? ? ? ?Gabrielle Bryant, MS, RD, LDN ?  Inpatient Clinical Dietitian ?Please see AMiON for contact information. ?  ?

## 2022-02-24 NOTE — Progress Notes (Signed)
Subjective: ?MRI c/t/l spine wwo contrast overnight showed extensive epidural abscess cervicomedullary junction extending to entirety of cervical, thoracic, and lumbar spine (personal review). Patient was taken to OR by NSU for cervical decompression and instrumentation. She was seen at bedside after the procedure. On fentanyl and precedex she opens her eyes to voice but is unable to move any extremity to command. ? ?Exam: ?Vitals:  ? 02/24/22 1630 02/24/22 1700  ?BP: 139/65 132/61  ?Pulse: 80 80  ?Resp: (!) 24 (!) 24  ?Temp:    ?SpO2: 100% 100%  ? ?Gen: In bed, intubated ? ?Exam not performed off sedation 2/2 cervical decompression and instrumentation this AM - currently on fentanyl and precedex ? ?MS: opens eyes to voice, does not clearly follow commands on sedation ?CN: She fixates and tracks briefly, but blinks to frequently to check blink to threat, eyes are midline ?Motor: No movement in any extremity to command or noxious stimuli ?Sensory: UTA ?DTR: Depressed throughout ? ? ?Impression and recommendations: 56 year old female with E. coli meningitis, now with extensive epidural abscess throughout cervical, thoracic, and lumbar spine. S/p cervical decompression and instrumentation 5/13. Overall prognosis regarding recovery of upper extremity function is guarded. Will continue to follow. Appreciate ID assistance with abx mgmt.  ? ?This patient is critically ill and at significant risk of neurological worsening, death and care requires constant monitoring of vital signs, hemodynamics,respiratory and cardiac monitoring, neurological assessment, discussion with family, other specialists and medical decision making of high complexity. I spent 55 minutes of neurocritical care time  in the care of  this patient. This was time spent independent of any time provided by nurse practitioner or PA. ? ?Su Monks, MD ?[facility] ?[phone] ? ?If 7pm- 7am, please page neurology on call as listed in  [facility]. ? ?

## 2022-02-24 NOTE — Progress Notes (Signed)
? ?NAME:  Gabrielle Vincent, MRN:  [id number], DOB:  [birth year], LOS: 10 ?ADMISSION DATE:  02/14/2022, CONSULTATION DATE:  5/5 ?REFERRING MD:  Rai, CHIEF COMPLAINT:  acute encephalopathy and sepsis   ? ?History of Present Illness:  ?56 year old female who was admitted on 5/3 with chief complaint of severe low back pain radiating down her left leg.  Also had associated hematuria and dysuria.  She had no weakness although low did say pain impacted her ability to walk.  Also noted poor p.o. intake, decreased urination as well as urinary retention, hematuria, dysuria, and some nausea.  ? ?CT scan was negative for obstructive uropathy did show fatty liver infiltrates, there is cholelithiasis but no acute cholecystitis there is mild circumferential thickening of the distal esophagus global enlargement of the uterus with thickened appearance of the endometrial stripe with radiology recommending nonemergent pelvic ultrasound.  Diagnostic evaluation notable for new AKI with serum creatinine 3.64, acute transaminitis leukocytosis, and hyponatremia of 127 she was admitted for further evaluation, cultures were sent, and she was started on IV ceftriaxone. ? ?On 5/3 patient was noted to be more confused, speech was slurred , Working diagnosis was #1 urinary tract infection with resultant sepsis and also low back pain for which neurosurgery was consulted as MRI finding showed acute herniated L3-L4 disc with left lumbar radiculopathy.  It was felt that pain control via lumbar injection may help in that this could be treated conservatively. ? ?On 5/4 patient becoming intermittently agitated then lethargic, because of this a CT of head was obtained this was severely limited due to motion degraded meant but there was concern about hypodense area in the right basal ganglia section raising concern for acute infarct.  On 5/5 a rapid response was called the patient was severely agitated, pulled out IVs, pull out Foley catheter ? ?Later that  afternoon mental status continued to worsen.  Neurology consult was obtained in addition to this infectious disease was consulted with urine growing E. coli and narrowed antibiotics to cefazolin stopped vancomycin and recommended supportive care because her mental status continued to decline, she had severe metabolic derangements, and we were unable to provide medical care on the [facility] ward she was transferred to the ICU for higher level of care.  On initial arrival she was agitated requiring several nurses to hold her down, tachypneic tachycardic would not follow commands had marked increased work of breathing.  An intraosseous line was placed in the right lower extremity, she was intubated for airway protection to facilitate further MRI imaging, and a right IJ triple-lumen catheter was placed ? ?Pertinent  Medical History  ?Class II obesity, hepatic steatosis, seasonal allergies, varicose veins diverticulosis, HL  ? ?Significant Hospital Events: ?Including procedures, antibiotic start and stop dates in addition to other pertinent events   ?5/3 admitted with back pain and urinary tract infection , Further complicated by AKI hyponatremia and multiple metabolic derangements.   ?CT imaging for renal stones showed no acute abdominal pelvic findings there was no obstructive uropathy there was severe fatty liver disease there was cholelithiasis but no cholecystitis there is colonic diverticulosis but no diverticulitis, mild circumferential thickening of the esophagus globular enlargement of the uterus with radiology recommending nonemergent pelvic ultrasound found to have uterine fibroid abdominal Ultrasound showed fatty liver but was epididymides negative.   ?MRI of the lumbar spine showed small left subarticular disc extrusion with inferior migration at L3-L4 correlating with radiculopathy.  She was started on ceftriaxone cultures were sent ?5/4 increased confusion CT  brain obtained raising concern for possible acute  infarct in the right basal ganglia ?5/5 progressive encephalopathy , Worsening leukocytosis, hypernatremia, hyperchloremia, slowly improving renal function, slowly improving procalcitonin, moved to ICU due to delirium, intubated for airway protection and to facilitate MRI imaging right IJ central line placed due to limited IV access.  Seen by neurology, also seen by infectious disease with ceftriaxone changed to cefazolin ?5/6 antibiotics changed to meningitis coverage given MRI findings of fluid in the ventricles.  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  DDAVP and volume resuscitation started ?5/11 hypernatremia slowly downtrending, continue DDAVP and IV resuscitation per nephrology ?5/12 Sodium downtrended to 147 this am ? ?Interim History / Subjective:  ?Patient had MRI spine, which showed extensive epidural abscess extending from foraminal magnum to the sacral spine ?Remains off vasopressors and afebrile ? ?Objective   ?Blood pressure (!) 120/59, pulse 71, temperature 99.2 ?F (37.3 ?C), temperature source Oral, resp. rate (!) 25, height '5\' 4"'$  (1.626 m), weight 102.2 kg, SpO2 96 %. ?   ?Vent Mode: PRVC ?FiO2 (%):  [30 %-40 %] 40 % ?Set Rate:  [24 bmp] 24 bmp ?Vt Set:  [440 mL] 440 mL ?PEEP:  [5 cmH20] 5 cmH20 ?Pressure Support:  [15 cmH20] 15 cmH20 ?Plateau Pressure:  [11 cmH20-17 cmH20] 11 cmH20  ? ?Intake/Output Summary (Last 24 hours) at 02/24/2022 0802 ?Last data filed at 02/24/2022 0700 ?Gross per 24 hour  ?Intake 1989.69 ml  ?Output 2825 ml  ?Net -835.31 ml  ? ?Filed Weights  ? 02/22/22 0455 02/23/22 0500 02/23/22 2300  ?Weight: 106.8 kg 111.1 kg 102.2 kg  ? ?Physical exam ?General: Crtitically ill-appearing female, orally intubated ?HEENT: [place]/AT, eyes anicteric.  ETT and OGT in place ?Neuro:  Opens eyes with vocal stimuli, not following commands, no movement to painful stimuli in any extremities.  Sensation is intact ?Chest: Coarse breath sounds, no wheezes or rhonchi ?Heart: Regular rate and rhythm, no murmurs or gallops ?Abdomen: Soft, nontender, nondistended, bowel sounds present ?Skin: No rash  ? ?Assessment & Plan:  ?Principal Problem: ?  Sepsis secondary to UTI [facility]) ?Active Problems: ?  Hyponatremia ?  Hypokalemia ?  AKI (acute kidney injury) ([place]) ?  Abnormal LFTs ?  Class II obesity ?  Hepatic steatosis ?  Cholelithiasis ?  Esophageal thickening ?  Acute low back pain ?  Encephalopathy acute ?  Acute respiratory failure ([place]) ?  Radiculopathy of lumbar region ?  E. coli UTI ?  Meningitis ?  Bacterial meningitis ?  Diabetes insipidus ([place]) ? ? ?Acute metabolic/septic encephalopathy  ?Acute bacterial meningitis with E. Coli due to direct extension from epidural abscess ?Severe sepsis secondary to E. coli UTI and E.coli meningitis ?Extensive epidural abscess extending from foramen magnum to the sacral spine ?Patient mental status is slightly better ?Septic shock has resolved ?CSF culture is growing E. coli ?MRI spine yesterday showed extensive abscesses extending from foreman magnum down to sacral spine ?Appreciate neurosurgery input ?Patient is going for cervical spine washout and evacuation of abscess ?Continue IV Ceftriaxone  ?Appreciate infectious disease input  ? ?Central DI  ?Hypernatremia ?Probably secondary to meningitis ?D5W was stopped as serum sodium is trending down now it is 144 ?Continue DDAVP as needed ?Continue free water flushes ?Monitor serum sodium ? ?Acute hypoxic respiratory failure  ?  Difficult airway ?Continue ventilator support with lung protective strategies  ?Wean PEEP and FiO2 for sats greater than 90%. ?Head of bed elevated 30 degrees. ?Peak, plateau and driving pressures are at goal.  ?SAT/SBT as tolerated, mentation preclude extubation  ?Ensure adequate pulmonary  hygiene  ?Follow cultures  ?VAP bundle in place  ?PAD protocol ? ?AKI due to septic ATN ?Serum creatinine continue to improve ?Monitor intake and output  ?Avoid nephrotoxins ?Ensure adequate renal perfusion  ?

## 2022-02-24 NOTE — Progress Notes (Signed)
Pt being taken to the OR. Pt bagged by OR staff 100% FiO2 and peep valve. RT to perform routine vent check when pt arrives back to unit.  ?

## 2022-02-24 NOTE — Anesthesia Preprocedure Evaluation (Addendum)
Anesthesia Evaluation  ?Patient identified by MRN, date of birth, ID band ?Patient unresponsive ? ?General Assessment Comment:Pt sedated, ventilated ? ?Reviewed: ?Allergy & Precautions, NPO status , Patient's Chart, lab work & pertinent test results, Unable to perform ROS - Chart review only ? ?History of Anesthesia Complications ?Negative for: history of anesthetic complications ? ?Airway ?Mallampati: Intubated ? ? ? ? ? ? Dental ?  ?Pulmonary ? ?Ventilated, quadriparetic  ?  ?breath sounds clear to auscultation ? ? ? ? ? ? Cardiovascular ?negative cardio ROS ?Normal cardiovascular exam ?Rhythm:Regular Rate:Normal ? ? ?  ?Neuro/Psych ?epidural abscess of the foramen magnum down to the upper thoracic spine and there is signal change in the spinal cord starting about T1 ? ?quadriparetic ?  ? GI/Hepatic ?Neg liver ROS, GERD  Medicated,  ?Endo/Other  ?Morbid obesity ? Renal/GU ?Renal InsufficiencyRenal disease  ? ?  ?Musculoskeletal ? ?(+) Arthritis ,  ? Abdominal ?(+) + obese,   ?Peds ? Hematology ? ?(+) Blood dyscrasia (Hb 10.6, plt 179k), anemia ,   ?Anesthesia Other Findings ? ? Reproductive/Obstetrics ? ?  ? ? ? ? ? ? ? ? ? ? ? ? ? ?  ?  ? ? ? ? ? ? ? ?Anesthesia Physical ?Anesthesia Plan ? ?ASA: 4 ? ?Anesthesia Plan: General  ? ?Post-op Pain Management:   ? ?Induction: Intravenous ? ?PONV Risk Score and Plan: 3 and Ondansetron and Treatment may vary due to age or medical condition ? ?Airway Management Planned: Oral ETT ? ?Additional Equipment: Arterial line ? ?Intra-op Plan:  ? ?Post-operative Plan: Post-operative intubation/ventilation ? ?Informed Consent: I have reviewed the patients History and Physical, chart, labs and discussed the procedure including the risks, benefits and alternatives for the proposed anesthesia with the patient or authorized representative who has indicated his/her understanding and acceptance.  ? ? ? ?Consent reviewed with POA and History available from  chart only ? ?Plan Discussed with: CRNA and Surgeon ? ?Anesthesia Plan Comments:   ? ? ? ? ? ?Anesthesia Quick Evaluation ? ?

## 2022-02-24 NOTE — Progress Notes (Signed)
Patient seen and examined this morning I have spoken with 4 family members in the room.  She has pain to touch in her hands and arms suggestive of neuropathic pain.  She remains intubated but will follow commands.  There is some movement in the right upper extremity to noxious stimuli, may be very slight movement in the left upper extremity but it is difficult to tell.  No movement in the lower extremities. ? ?Plan is for posterior cervical decompression and instrumented fusion today in the hopes of improvement of her upper extremity function.  I think the prognosis is poor but I do believe surgery on the cervical spine is indicated.  Family understands the risks and benefits and the possibility of improvement.  They wish to proceed.  They hope to remain aggressive with her. ?

## 2022-02-24 NOTE — Op Note (Signed)
02/24/2022 ? ?12:34 PM ? ?PATIENT:  Gabrielle Vincent  56 y.o. female ? ?PRE-OPERATIVE DIAGNOSIS: Extensive epidural abscess with quadriplegia ? ?POST-OPERATIVE DIAGNOSIS:  same ? ?PROCEDURE:  1.  Posterior cervical decompressive laminectomy and medial facetectomy C3-T1 for evacuation of epidural abscess, 2.  Posterior cervical arthrodesis C3-C7 utilizing locally harvested morselized autologous bone graft mixed with morselized allograft, 3.  Segmental fixation C3-C7 inclusive utilizing Alphatec lateral mass screws ? ?SURGEON:  Sherley Bounds, MD ? ?ASSISTANTS: Glenford Peers FNP ? ?ANESTHESIA:   General ? ?EBL: 150 ml ? ?Total I/O ?In: 2000 [I.V.:2000] ?Out: 325 [Urine:325] ? ?BLOOD ADMINISTERED: none ? ?DRAINS: Medium Hemovac ? ?SPECIMEN: Intraoperative cultures ? ?INDICATION FOR PROCEDURE: This patient presented with back pain and difficulty walking.  She quickly decompensated with sepsis and was intubated.  He has been on antibiotics and been intubated in the unit for the last week.  She has not had movement of her extremities for several days.. Imaging showed stents of epidural abscess C1-S2 with cord compression with signal change in the cord in the thoracic spine. Recommended for cervical decompression and instrumented fusion in hopes of improvement of her upper extremity weakness.. Patient's family understood the risks, benefits, and alternatives and potential outcomes and wished to proceed. ? ?PROCEDURE DETAILS: The patient was brought to the operating room. Generalized endotracheal anesthesia was induced. The patient was affixed a 3 point [place] headrest and rolled into the prone position on chest rolls. All pressure points were padded. The posterior cervical region was cleaned with alcohol and prepped with DuraPrep and then draped in the usual sterile fashion. 7 cc of local anesthesia was injected and a dorsal midline incision made in the posterior cervical region and carried down to the cervical fascia. The  fascia was opened and the paraspinous musculature was taken down to expose C2-T1. Intraoperative fluoroscopy confirmed my level and then the dissection was carried out over the lateral facets. I localized the midpoint of each lateral mass and marked a region 1 mm medial to the midpoint of the lateral mass, and then drilled in an upward and outward direction into the safe zone of each lateral mass from C3-C7. I drilled to a depth of 16 mm and then checked my drill hole with a ball probe. I then placed a 16 mm lateral mass screws into the safe zone of each lateral mass C3-C7 inclusive until they were 2 fingers tight.  We then remove the spinous processes of C3 C4-C5-C6 and C7 and drilled the lamina down to an eggshell the entire way.  I then gently decompressed the central canal with the 1 and 2 mm Kerrison punch from C3 to halfway down the lamina of T1. Medial facetectomies were performed.  There was immediate release of pus under pressure at C7 and we started the decompression.  We continued to release large amounts of pus as we continue to march superiorly on the left-hand side.  We then even undercut the C2 lamina and reached up under but there was more phlegmon here than pus.  At the top of T1 we drilled the top of the lamina and released large amounts of pus from the left-hand side.  We therefore used a pediatric feeding tube and passed this up to even 15 cm distally along the spine and suck pus through the pediatric feeding tube.  Once the decompression was complete the dura was full and capacious. I then decorticated the lateral masses and the facet joints and packed them with local autograft and  morcellized allograft to perform arthrodesis from C3-C7 bilaterally. I then placed rods into the multiaxial screw heads of the screws and locked these into position with the locking caps and anti-torque device. I then checked the final construct with AP/Lat fluoroscopy. I irrigated with saline solution containing  bacitracin. I placed a medium Hemovac drain through separate stab incision, and lined the dura with Gelfoam. After hemostasis was achieved I closed the muscle and the fascia with 0 Vicryl, subcutaneous tissue with 2-0 Vicryl, and the subcuticular tissue with 3-0 Vicryl. The skin was closed with benzoin and Steri-Strips. A sterile dressing was applied, the patient was turned to the supine position and taken out of the headrest, awakened from general anesthesia and transferred to the recovery room in stable condition. At the end of the procedure all sponge, needle and instrument counts were correct.  I nurse practitioner was present and scrubbed for the entire case and helped with the exposure, the placement of the screws, the decompression, the fusion, and the closure. ? ? ? ?PLAN OF CARE: Admit to inpatient  ? ?PATIENT DISPOSITION:  PACU - hemodynamically stable.  But intubated and critically ill ?  ?Delay start of Pharmacological VTE agent (>24hrs) due to surgical blood loss or risk of bleeding:  yes ? ? ? ? ? ? ? ? ? ? ? ? ? ? ?

## 2022-02-25 DIAGNOSIS — N10 Acute tubulo-interstitial nephritis: Secondary | ICD-10-CM | POA: Diagnosis not present

## 2022-02-25 DIAGNOSIS — N390 Urinary tract infection, site not specified: Secondary | ICD-10-CM | POA: Diagnosis not present

## 2022-02-25 DIAGNOSIS — N179 Acute kidney failure, unspecified: Secondary | ICD-10-CM | POA: Diagnosis not present

## 2022-02-25 DIAGNOSIS — Z9889 Other specified postprocedural states: Secondary | ICD-10-CM

## 2022-02-25 DIAGNOSIS — A419 Sepsis, unspecified organism: Secondary | ICD-10-CM | POA: Diagnosis not present

## 2022-02-25 DIAGNOSIS — G062 Extradural and subdural abscess, unspecified: Secondary | ICD-10-CM

## 2022-02-25 DIAGNOSIS — J9600 Acute respiratory failure, unspecified whether with hypoxia or hypercapnia: Secondary | ICD-10-CM | POA: Diagnosis not present

## 2022-02-25 DIAGNOSIS — G009 Bacterial meningitis, unspecified: Secondary | ICD-10-CM | POA: Diagnosis not present

## 2022-02-25 LAB — BASIC METABOLIC PANEL
Anion gap: 7 (ref 5–15)
BUN: 29 mg/dL — ABNORMAL HIGH (ref 6–20)
CO2: 17 mmol/L — ABNORMAL LOW (ref 22–32)
Calcium: 7.3 mg/dL — ABNORMAL LOW (ref 8.9–10.3)
Chloride: 121 mmol/L — ABNORMAL HIGH (ref 98–111)
Creatinine, Ser: 1.24 mg/dL — ABNORMAL HIGH (ref 0.44–1.00)
GFR, Estimated: 51 mL/min — ABNORMAL LOW (ref >60)
Glucose, Bld: 194 mg/dL — ABNORMAL HIGH (ref 70–99)
Potassium: 3.4 mmol/L — ABNORMAL LOW (ref 3.5–5.1)
Sodium: 145 mmol/L (ref 135–145)

## 2022-02-25 LAB — CBC
HCT: 33.7 % — ABNORMAL LOW (ref 36.0–46.0)
Hemoglobin: 10.9 g/dL — ABNORMAL LOW (ref 12.0–15.0)
MCH: 29.5 pg (ref 26.0–34.0)
MCHC: 32.3 g/dL (ref 30.0–36.0)
MCV: 91.3 fL (ref 80.0–100.0)
Platelets: 174 10*3/uL (ref 150–400)
RBC: 3.69 MIL/uL — ABNORMAL LOW (ref 3.87–5.11)
RDW: 16.7 % — ABNORMAL HIGH (ref 11.5–15.5)
WBC: 11.3 10*3/uL — ABNORMAL HIGH (ref 4.0–10.5)
nRBC: 0 % (ref 0.0–0.2)

## 2022-02-25 LAB — POCT I-STAT 7, (LYTES, BLD GAS, ICA,H+H)
Acid-base deficit: 6 mmol/L — ABNORMAL HIGH (ref 0.0–2.0)
Bicarbonate: 16.8 mmol/L — ABNORMAL LOW (ref 20.0–28.0)
Calcium, Ion: 1.16 mmol/L (ref 1.15–1.40)
HCT: 27 % — ABNORMAL LOW (ref 36.0–46.0)
Hemoglobin: 9.2 g/dL — ABNORMAL LOW (ref 12.0–15.0)
O2 Saturation: 99 %
Patient temperature: 99
Potassium: 3.4 mmol/L — ABNORMAL LOW (ref 3.5–5.1)
Sodium: 148 mmol/L — ABNORMAL HIGH (ref 135–145)
TCO2: 18 mmol/L — ABNORMAL LOW (ref 22–32)
pCO2 arterial: 25.5 mmHg — ABNORMAL LOW (ref 32–48)
pH, Arterial: 7.428 (ref 7.35–7.45)
pO2, Arterial: 130 mmHg — ABNORMAL HIGH (ref 83–108)

## 2022-02-25 LAB — GLUCOSE, CAPILLARY
Glucose-Capillary: 176 mg/dL — ABNORMAL HIGH (ref 70–99)
Glucose-Capillary: 166 mg/dL — ABNORMAL HIGH (ref 70–99)
Glucose-Capillary: 190 mg/dL — ABNORMAL HIGH (ref 70–99)
Glucose-Capillary: 167 mg/dL — ABNORMAL HIGH (ref 70–99)
Glucose-Capillary: 204 mg/dL — ABNORMAL HIGH (ref 70–99)
Glucose-Capillary: 222 mg/dL — ABNORMAL HIGH (ref 70–99)

## 2022-02-25 LAB — SODIUM
Sodium: 149 mmol/L — ABNORMAL HIGH (ref 135–145)
Sodium: 148 mmol/L — ABNORMAL HIGH (ref 135–145)

## 2022-02-25 MED ORDER — ZINC OXIDE 11.3 % EX CREA
TOPICAL_CREAM | Freq: Two times a day (BID) | CUTANEOUS | Status: DC
  Administered 2022-03-07 – 2022-03-10 (×4): 1 via TOPICAL
  Filled 2022-02-25 (×2): qty 56

## 2022-02-25 MED ORDER — VANCOMYCIN HCL 1500 MG/300ML IV SOLN
1500.0000 mg | Freq: Once | INTRAVENOUS | Status: AC
  Administered 2022-02-25: 1500 mg via INTRAVENOUS
  Filled 2022-02-25: qty 300

## 2022-02-25 MED ORDER — POTASSIUM CHLORIDE 20 MEQ PO PACK
40.0000 meq | PACK | Freq: Once | ORAL | Status: AC
  Administered 2022-02-25: 40 meq
  Filled 2022-02-25: qty 2

## 2022-02-25 MED ORDER — FREE WATER
200.0000 mL | Freq: Four times a day (QID) | Status: DC
  Administered 2022-02-25 – 2022-02-27 (×8): 200 mL

## 2022-02-25 MED ORDER — VANCOMYCIN HCL IN DEXTROSE 1-5 GM/200ML-% IV SOLN
1000.0000 mg | INTRAVENOUS | Status: DC
  Administered 2022-02-26 – 2022-02-28 (×3): 1000 mg via INTRAVENOUS
  Filled 2022-02-25 (×3): qty 200

## 2022-02-25 MED ORDER — NOREPINEPHRINE 16 MG/250ML-% IV SOLN
0.0000 ug/min | INTRAVENOUS | Status: DC
  Administered 2022-02-25: 2 ug/min via INTRAVENOUS
  Filled 2022-02-25: qty 250

## 2022-02-25 NOTE — Progress Notes (Addendum)
[facility]  Date of Admission:  02/14/2022           Reason for visit: Follow up on E. coli urinary tract infection, epidural abscess, meningitis  Current antibiotics: Ceftriaxone  ASSESSMENT:    56 y.o. female admitted with:  E. coli UTI, epidural abscess, meningitis: Patient initially presented 5/3 with back pain, dysuria, urinary retention, and newly diagnosed diabetes.  She had an MRI lumbar spine without contrast (due to AKI) that was read as epidural lipomatosis.  Epidural steroid injection was recommended for her back pain.  She was also started on IV steroids and given pain control.  Her urine cultures grew a sensitive E. coli and blood cultures were negative.  She was treated for UTI.  She subsequently became encephalopathic.  CT of the head was obtained.  This was motion degraded but raised the concern for acute stroke.  This subsequently led to an MRI which raised further concern for meningitis.  An LP was performed which was consistent with bacterial infection.  CSF cultures also grew E. coli.  This ultimately led to concern that the initial MRI findings may have actually been epidural abscess.  MRI with contrast was obtained on 5/12.  This unfortunately confirmed extensive epidural abscess extending the entire length of the spine.  She was taken to the OR with neurosurgery on 5/13 for posterior cervical decompression laminectomy and medial facetectomy C3-T1 with instrumented fusion from C3-C7. Ongoing fevers: Tmax over the last 24 hours is 102.4.  WBC has slowly improved over the course of her admission.  She does have a Foley catheter that remains necessary given her spinal cord injury as well as a right IJ CVC placed approximately 8 days ago.  She also has areas of epidural abscess in the lumbar spine and thoracic spine that were unable to undergo decompression. Acute kidney injury: Creatinine on admission was 3.3.  Today her creatinine is down to  1.2. Severe sepsis: Due to #1. Acute hypoxemic respiratory failure: Requiring mechanical ventilation for airway protection and remains intubated. Newly diagnosed DM: A1c is 8.9.  RECOMMENDATIONS:    Continue ceftriaxone 2 g every 12 hours for CNS coverage Follow cultures from OR 5/13 We will repeat blood cultures given her ongoing fevers and central line in place We will add vancomycin in the interim after blood cultures were obtained Greatly appreciate neurosurgery's assistance Will continue to follow   Principal Problem:   Sepsis secondary to UTI [facility]) Active Problems:   Hyponatremia   Hypokalemia   AKI (acute kidney injury) (HCC)   Abnormal LFTs   Class II obesity   Hepatic steatosis   Cholelithiasis   Esophageal thickening   Acute low back pain   Encephalopathy acute   Acute respiratory failure (HCC)   Radiculopathy of lumbar region   E. coli UTI   Meningitis   Bacterial meningitis   Diabetes insipidus (HCC)   H/O excision of lamina of cervical vertebra for decompression of spinal cord    MEDICATIONS:    Scheduled Meds: . chlorhexidine gluconate (MEDLINE KIT)  15 mL Mouth Rinse BID  . Chlorhexidine Gluconate Cloth  6 each Topical Daily  . desmopressin  1 mcg Intravenous QID  . docusate  100 mg Per Tube BID  . feeding supplement (PROSource TF)  45 mL Per Tube TID  . free water  200 mL Per Tube Q6H  . insulin aspart  0-15 Units Subcutaneous Q4H  . insulin aspart  2  Units Subcutaneous Q4H  . mouth rinse  15 mL Mouth Rinse 10 times per day  . pantoprazole sodium  40 mg Per Tube Q1400  . polyethylene glycol  17 g Per Tube Daily  . potassium chloride  40 mEq Per Tube Once  . sodium chloride flush  3 mL Intravenous Q12H   Continuous Infusions: . sodium chloride Stopped (02/24/22 1313)  . sodium chloride Stopped (02/24/22 1304)  . cefTRIAXone (ROCEPHIN)  IV Stopped (02/24/22 2339)  . dexmedetomidine (PRECEDEX) IV infusion 0.4 mcg/kg/hr (02/25/22 0600)  .  feeding supplement (OSMOLITE 1.2 CAL) 1,000 mL (02/24/22 1500)  . fentaNYL infusion INTRAVENOUS 175 mcg/hr (02/25/22 0600)  . norepinephrine (LEVOPHED) Adult infusion Stopped (02/25/22 0315)   PRN Meds:.acetaminophen (TYLENOL) oral liquid 160 mg/5 mL, acetaminophen **OR** acetaminophen, fentaNYL, fentaNYL (SUBLIMAZE) injection, hydrALAZINE, lip balm, midazolam, morphine injection, ondansetron **OR** ondansetron (ZOFRAN) IV, oxyCODONE, sodium chloride flush  SUBJECTIVE:   24 hour events:  Status post cervical decompression and instrumentation with neurosurgery Remains febrile WBC improved Creatinine stable  She remains intubated and sedated in the ICU.  There is no family members present at this time.  Review of Systems  Unable to perform ROS: Intubated     OBJECTIVE:   Blood pressure (!) 117/56, pulse 87, temperature (!) 101.1 F (38.4 C), temperature source Axillary, resp. rate (!) 24, height 5\' 4"  (1.626 m), weight 102.2 kg, SpO2 99 %. Body mass index is 38.67 kg/m.  Physical Exam Constitutional:      Comments: Ill-appearing woman, mechanically ventilated, awake with eyes open and tracking  HENT:     Head: Normocephalic and atraumatic.  Eyes:     Extraocular Movements: Extraocular movements intact.     Conjunctiva/sclera: Conjunctivae normal.  Neck:     Comments: Hemovac in place Right IJ CVC Cardiovascular:     Rate and Rhythm: Normal rate and regular rhythm.  Pulmonary:     Comments: Ventilated breath sounds, symmetric chest rise and fall Abdominal:     General: There is no distension.     Palpations: Abdomen is soft.     Tenderness: There is no abdominal tenderness.  Skin:    General: Skin is warm and dry.     Findings: No rash.  Neurological:     Cranial Nerves: No cranial nerve deficit.     Comments: No movement of her upper or lower extremities Remains on sedation with the ventilator     Lab Results: Lab Results  Component Value Date   WBC 11.3 (H)  02/25/2022   HGB 10.9 (L) 02/25/2022   HCT 33.7 (L) 02/25/2022   MCV 91.3 02/25/2022   PLT 174 02/25/2022    Lab Results  Component Value Date   NA 145 02/25/2022   K 3.4 (L) 02/25/2022   CO2 17 (L) 02/25/2022   GLUCOSE 194 (H) 02/25/2022   BUN 29 (H) 02/25/2022   CREATININE 1.24 (H) 02/25/2022   CALCIUM 7.3 (L) 02/25/2022   GFRNONAA 51 (L) 02/25/2022    Lab Results  Component Value Date   ALT 22 02/22/2022   AST 19 02/22/2022   ALKPHOS 105 02/22/2022   BILITOT 0.4 02/22/2022    No results found for: CRP  No results found for: ESRSEDRATE   I have reviewed the micro and lab results in Epic.  Imaging: DG Cervical Spine 2 or 3 views  Result Date: 02/24/2022 CLINICAL DATA:  Posterior cervical laminectomy EXAM: CERVICAL SPINE - 2-3 VIEW COMPARISON:  None Available. FLUOROSCOPY: Air kerma 2.55 mGy  FINDINGS: Intraoperative lateral fluoroscopic images of the cervical spine demonstrate posterior laminectomy of C3 through C7 with pedicle screws in position and overlying retractors. No obvious perihardware fracture or component malpositioning. IMPRESSION: Intraoperative lateral fluoroscopic images demonstrate posterior laminectomy of C3 through C7 with pedicle screws in position and overlying retractors. Electronically Signed   By: Jearld Lesch M.D.   On: 02/24/2022 13:44   MR CERVICAL SPINE W WO CONTRAST  Result Date: 02/23/2022 CLINICAL DATA:  Initial evaluation for epidural abscess. EXAM: MRI CERVICAL SPINE WITHOUT AND WITH CONTRAST TECHNIQUE: Multiplanar and multiecho pulse sequences of the cervical spine, to include the craniocervical junction and cervicothoracic junction, were obtained without and with intravenous contrast. CONTRAST:  10mL GADAVIST GADOBUTROL 1 MMOL/ML IV SOLN COMPARISON:  Comparison made with MRIs of the thoracic and lumbar spine performed on the same day. FINDINGS: Alignment: Straightening of the normal cervical lordosis. No significant listhesis. Vertebrae:  Vertebral body height maintained without acute or chronic fracture. Bone marrow signal intensity diffusely decreased on T1 weighted sequence, nonspecific, but most commonly related to anemia, smoking or obesity. No discrete or worrisome osseous lesions. No findings to suggest osteomyelitis discitis or septic arthritis within the cervical spine. Cord: Extensive long segment epidural abscess extends from the thoracic spine into the cervical spine to the level of the cervicomedullary junction. Abscess primarily localized within the dorsal epidural space and is greater on the left. Collection is maximal in nature at approximately the level of C4-5, measuring up to 7 mm in maximal AP diameter. Secondary moderate to severe diffuse spinal stenosis with mild cord flattening, but no cord signal changes. Posterior Fossa, vertebral arteries, paraspinal tissues: Scattered dural and leptomeningeal enhancement seen about the visualized base of the brain and posterior fossa, consistent with associated meningitis. No visible intracranial collections. Craniocervical junction remains patent. Paraspinous soft tissues demonstrate no acute finding. No soft tissue collections. Absent flow void within the left vertebral artery, likely occluded. Endotracheal tube in place. Disc levels: C2-C3: Negative interspace. Left dorsal epidural abscess with moderate spinal stenosis. Foramina remain patent. C3-C4: Mild disc bulge with uncovertebral spurring. Left dorsal epidural abscess. Moderate to severe spinal stenosis. Superimposed right-sided facet hypertrophy. No significant foraminal encroachment. C4-C5: Intervertebral disc space narrowing with diffuse disc osteophyte complex, with left greater than right uncovertebral and facet hypertrophy. Dorsal epidural abscess on the left. Moderate to severe spinal stenosis with moderate left C5 foraminal narrowing. C5-C6: Degenerative intervertebral disc space narrowing with diffuse disc osteophyte  complex. Left dorsal epidural abscess. Severe spinal stenosis with cord flattening but no definite cord signal changes. Severe bilateral C6 foraminal narrowing. C6-C7: Degenerative intervertebral disc space narrowing with diffuse disc osteophyte. Thin left dorsal epidural abscess. Moderate to severe spinal stenosis. No visible cord signal changes. Moderate left worse than right C7 foraminal narrowing. C7-T1: Small right paracentral disc protrusion. Dorsal epidural abscess. Moderate spinal stenosis. Left-sided facet arthrosis. Foramina remain patent. Delete that IMPRESSION: 1. Extensive epidural abscess extending from the thoracic spine into the cervical spine to the level of the cervicomedullary junction. Secondary moderate to severe diffuse spinal stenosis throughout the cervical spine, most pronounced at C5-6. No visible cord signal changes. 2. Dural and leptomeningeal enhancement about the visualized base of brain and posterior fossa, consistent with associated meningitis. No visible intracranial collections. 3. Underlying moderate degenerative spondylosis and facet arthrosis, most pronounced at C4-5 through C6-7. Associated moderate left C5 and bilateral C7 foraminal stenosis, with severe bilateral C6 foraminal narrowing. 4. Absent flow void within the left vertebral artery, likely  occluded. Electronically Signed   By: Rise Mu M.D.   On: 02/23/2022 19:31   MR THORACIC SPINE W WO CONTRAST  Result Date: 02/23/2022 CLINICAL DATA:  Low back pain. Meningitis. Concern for epidural abscess. EXAM: MRI THORACIC AND LUMBAR SPINE WITHOUT AND WITH CONTRAST TECHNIQUE: Multiplanar and multiecho pulse sequences of the thoracic and lumbar spine were obtained without and with intravenous contrast. CONTRAST:  10mL GADAVIST GADOBUTROL 1 MMOL/ML IV SOLN COMPARISON:  Noncontrast lumbar spine MRI 02/14/2022. Chest radiograph 02/18/2022. FINDINGS: MRI THORACIC SPINE FINDINGS Alignment:  No listhesis. Vertebrae:  Diffusely decreased bone marrow T1 signal intensity, nonspecific though can be seen with anemia, smoking, and obesity. No fracture, suspicious marrow lesion, or evidence of discitis. Complex, peripherally enhancing dorsal epidural fluid collection throughout the entirety of the thoracic spine with some internal septations. The collection measures up to 9 mm in AP thickness. Cord: Mild-to-moderate mass effect on the spinal cord by the epidural fluid collection. No cord signal abnormality. Paraspinal and other soft tissues: Likely dependent atelectasis in both lungs. 2.5 cm focal signal abnormality in the right lung apex which appears to extend to the pleura with a corresponding opacity on the prior chest radiograph. Disc levels: Severe spinal stenosis throughout the thoracic spine due to the epidural collection. Small right paracentral disc protrusion at T7-8 and small left paracentral disc protrusion at T9-10. MRI LUMBAR SPINE FINDINGS Segmentation: Counting down from the craniocervical junction, the lowest lumbar type vertebrae appears to reflect a completely lumbarized S1. Alignment:  Normal. Vertebrae: Diffusely decreased bone marrow T1 signal intensity as noted above. No fracture, suspicious marrow lesion, or evidence of discitis. Edema and enhancement surrounding the right L4-5 and bilateral L5-S1 facet joints with associated joint effusions. Peripherally enhancing dorsal epidural fluid collection (with less pronounced ventral component as well) throughout the entirety of the lumbar spine measuring up to 8 mm in AP thickness. Conus medullaris: Extends to the L1-2 level. Diffuse dural enhancement and suspected enhancement of the cauda equina nerve roots. Paraspinal and other soft tissues: Inflammation in the soft tissues posterior to the L4-S1 facet joints bilaterally. Disc levels: Moderate to severe spinal stenosis throughout the lumbar spine due to the epidural fluid collection. Mild disc bulging from L3-4 to  L5-S1 with a superimposed small right subarticular to right foraminal disc protrusion at L3-4. No compressive neural foraminal stenosis. IMPRESSION: 1. 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 Associated severe diffuse spinal stenosis without evidence of cord edema. 2. Suspected enhancement along the cauda equina likely related to known meningitis. 3. Inflammation about the L4-5 and L5-S1 facet joints, suspicious for septic arthritis in this setting. 4. 2.5 cm focal signal abnormality in the right lung apex. Radiographic follow-up is recommended as infectious and neoplastic causes are possible. These results will be called to the ordering clinician or representative by the Radiologist Assistant, and communication documented in the PACS or [employer]. Electronically Signed   By: Sebastian Ache M.D.   On: 02/23/2022 16:13   MR Lumbar Spine W Wo Contrast  Result Date: 02/23/2022 CLINICAL DATA:  Low back pain. Meningitis. Concern for epidural abscess. EXAM: MRI THORACIC AND LUMBAR SPINE WITHOUT AND WITH CONTRAST TECHNIQUE: Multiplanar and multiecho pulse sequences of the thoracic and lumbar spine were obtained without and with intravenous contrast. CONTRAST:  10mL GADAVIST GADOBUTROL 1 MMOL/ML IV SOLN COMPARISON:  Noncontrast lumbar spine MRI 02/14/2022. Chest radiograph 02/18/2022. FINDINGS: MRI THORACIC SPINE FINDINGS Alignment:  No listhesis.  Vertebrae: Diffusely decreased bone marrow T1 signal intensity, nonspecific though can be seen with anemia, smoking, and obesity. No fracture, suspicious marrow lesion, or evidence of discitis. Complex, peripherally enhancing dorsal epidural fluid collection throughout the entirety of the thoracic spine with some internal septations. The collection measures up to 9 mm in AP thickness. Cord: Mild-to-moderate mass effect on the spinal cord by the epidural fluid collection. No cord signal  abnormality. Paraspinal and other soft tissues: Likely dependent atelectasis in both lungs. 2.5 cm focal signal abnormality in the right lung apex which appears to extend to the pleura with a corresponding opacity on the prior chest radiograph. Disc levels: Severe spinal stenosis throughout the thoracic spine due to the epidural collection. Small right paracentral disc protrusion at T7-8 and small left paracentral disc protrusion at T9-10. MRI LUMBAR SPINE FINDINGS Segmentation: Counting down from the craniocervical junction, the lowest lumbar type vertebrae appears to reflect a completely lumbarized S1. Alignment:  Normal. Vertebrae: Diffusely decreased bone marrow T1 signal intensity as noted above. No fracture, suspicious marrow lesion, or evidence of discitis. Edema and enhancement surrounding the right L4-5 and bilateral L5-S1 facet joints with associated joint effusions. Peripherally enhancing dorsal epidural fluid collection (with less pronounced ventral component as well) throughout the entirety of the lumbar spine measuring up to 8 mm in AP thickness. Conus medullaris: Extends to the L1-2 level. Diffuse dural enhancement and suspected enhancement of the cauda equina nerve roots. Paraspinal and other soft tissues: Inflammation in the soft tissues posterior to the L4-S1 facet joints bilaterally. Disc levels: Moderate to severe spinal stenosis throughout the lumbar spine due to the epidural fluid collection. Mild disc bulging from L3-4 to L5-S1 with a superimposed small right subarticular to right foraminal disc protrusion at L3-4. No compressive neural foraminal stenosis. IMPRESSION: 1. 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 Associated severe diffuse spinal stenosis without evidence of cord edema. 2. Suspected enhancement along the cauda equina likely related to known meningitis. 3. Inflammation about the L4-5 and L5-S1 facet  joints, suspicious for septic arthritis in this setting. 4. 2.5 cm focal signal abnormality in the right lung apex. Radiographic follow-up is recommended as infectious and neoplastic causes are possible. These results will be called to the ordering clinician or representative by the Radiologist Assistant, and communication documented in the PACS or [employer]. Electronically Signed   By: Sebastian Ache M.D.   On: 02/23/2022 16:13   DG C-Arm 1-60 Min-No Report  Result Date: 02/24/2022 Fluoroscopy was utilized by the requesting physician.  No radiographic interpretation.     Imaging independently reviewed in Epic.    Vedia Coffer for Infectious Disease [facility] [phone] pager 02/25/2022, 8:36 AM

## 2022-02-25 NOTE — Progress Notes (Signed)
Subjective: ?Underwent C3-T1 laminectomy and evacuation of abscess with neurosurgery.  ?This AM, only flicker movement R > L UE for examiner ?Alert, follows motor commands face ? ?Exam: ?Vitals:  ? 02/25/22 0800 02/25/22 0805  ?BP:    ?Pulse:    ?Resp:    ?Temp: (!) 101.1 ?F (38.4 ?C)   ?SpO2:  99%  ? ?Gen: In bed, intubated ? ?Exam not performed off sedation 2/2 cervical decompression and instrumentation this AM - still on fentanyl and precedex but quite alert  ? ?MS: eyes open, follows commands to move EOMs, close eyes ?CN: tracks with eyes,blinks to threat, eyes are midline ?Motor: No movement in any extremity to command and only flicker 1/5 on noxious stimuli ?Sensory: UTA ?DTR: Depressed throughout ? ?Impression and recommendations: 56 year old female with E. coli meningitis, now with extensive epidural abscess throughout cervical, thoracic, and lumbar spine. S/p cervical decompression and instrumentation with neurosurgery 5/13. Overall prognosis regarding recovery of upper extremity function is guarded. Appreciate ID assistance with abx mgmt.  ? ?Recommendations: ?-continued care per ICU team, infectious disease ?-continued surgical follow up with neurosurgery  ?-will need extensive rehabilitation and prognosis guarded ?-neurology will sign off, as nothing further to add to patient's workup, but will be available as needed to re-evaluate the patient or answer any questions  ? ?Lennie Hummer, PA-C ?Neurology ? ? ?

## 2022-02-25 NOTE — Progress Notes (Signed)
She is awake and follows simple commands with her face, unable to move arms or legs though with noxious stimuli she will flex both upper extremities at the elbow.  Appears to have less neuropathic pain when I touch her hands.  Drain with about 50 cc of output over the last shift.  Continue current management.  Continue antibiotics.  Discussed case with infectious disease.  Cultures reviewed.  Prognosis is poor for functional recovery, though I am encouraged by the increased movement with flexion at the elbows to noxious stimuli. ?

## 2022-02-25 NOTE — Progress Notes (Signed)
Pharmacy Antibiotic Note ? ?Gabrielle Vincent is a 56 y.o. female admitted on 02/14/2022 with E coli meningitis and UTI.  Also found to have extensive epidural abscess s/p posterior cervical decompression laminectomy and medial facetectomy C3-T1 with instrumented fusion from C3-C7. Pharmacy has been consulted for vancomycin dosing due to concern for bacteremia; also on ceftriaxone. New blood cultures taken given ongoing fevers. ID following. Tm 102.4, WBC down 11.3, and SCr 1.24 with good UOP.  ? ?Plan: ?Vancomycin 1500 mg IV load ?Vancomycin 1000 mg IV q24h. Goal AUC 400-550. ?Expected AUC: 480.7 ?SCr used: 1.24 ?Continue ceftriaxone 2 g IV q12h ?Monitor renal function, clinical progress, cultures/sensitivities ?F/U LOT and de-escalate as able ?Vancomycin levels as clinically indicated ? ? ?Height: '5\' 4"'$  (162.6 cm) ?Weight: 102.2 kg (225 lb 5 oz) ?IBW/kg (Calculated) : 54.7 ? ?Temp (24hrs), Avg:100.1 ?F (37.8 ?C), Min:97.9 ?F (36.6 ?C), Max:102.4 ?F (39.1 ?C) ? ?Recent Labs  ?Lab 02/20/22 ?0818 02/21/22 ?0405 02/22/22 ?5093 02/23/22 ?2671 02/23/22 ?2123 02/24/22 ?2458 02/25/22 ?0998  ?WBC  --  19.2* 15.9* 15.5*  --  14.1* 11.3*  ?CREATININE  --  1.54* 1.46* 1.26*  --  1.15* 1.24*  ?LATICACIDVEN  --   --   --   --  1.0  --   --   ?VANCOTROUGH 22*  --   --   --   --   --   --   ?  ?Estimated Creatinine Clearance: 58.9 mL/min (A) (by C-G formula based on SCr of 1.24 mg/dL (H)).   ? ?Allergies  ?Allergen Reactions  ? Erythromycin Anaphylaxis  ? Penicillins Nausea Only  ? Prednisone Other (See Comments)  ?  Lymph node swelling  ? ? ?Antimicrobials this admission: ?5/4 Rocephin >> 5/5 ?5/5 Vanc >> 5/9 ?5/6 Ampicillin >> 5/9 ?5/6 cefepime>> 5/9 ?5/6 acyclovir >> 5/9 ?5/9 Ceftriaxone >>  ?5/14 Vanc >> ? ?Dose adjustments this admission: ?5/9 VT (drawn early):  22 on 1250 mg q24h ? ?Microbiology results: ?5/3 Ucx >100k E.coli (pan-sens except amp, amp/sul)  ?5/5 MRSA PCR: not detected ?5/4 Bcx: ngF ?5/7 CSF: moderate Ecoli  (pan-sens except amp, amp/sul) ?5/7 HSV-1, HSV-2 DNA Negative  ?5/13 Abscess: rare E coli ?5/13 MRSA PCR neg ?5/14 BCx: pending ? ?Thank you for involving pharmacy in this patient's care. ? ?Renold Genta, PharmD, BCPS ?Clinical Pharmacist ?Clinical phone for 02/25/2022 until 3p is [phone] ?02/25/2022 10:53 AM ? ?**Pharmacist phone directory can be found on [email or web address] listed under [place]** ? ? ?

## 2022-02-25 NOTE — Progress Notes (Signed)
eLink Physician-Brief Progress Note ?Patient Name: Nathalee Smarr ?DOB: [birth year] ?MRN: [id number] ? ? ?Date of Service ? 02/25/2022  ?HPI/Events of Note ? Notified for MAP 52, with current sedation ?Already 8L positive since admission  ?eICU Interventions ? Placed order to start levophed. Titrate to target MAP >65  ? ? ? ?  ? ?[place] ?02/25/2022, 12:11 AM ?

## 2022-02-25 NOTE — Progress Notes (Signed)
? ?NAME:  Gabrielle Vincent, MRN:  [id number], DOB:  [birth year], LOS: 64 ?ADMISSION DATE:  02/14/2022, CONSULTATION DATE:  5/5 ?REFERRING MD:  Rai, CHIEF COMPLAINT:  acute encephalopathy and sepsis   ? ?History of Present Illness:  ?56 year old female who was admitted on 5/3 with chief complaint of severe low back pain radiating down her left leg.  Also had associated hematuria and dysuria.  She had no weakness although low did say pain impacted her ability to walk.  Also noted poor p.o. intake, decreased urination as well as urinary retention, hematuria, dysuria, and some nausea.  ? ?CT scan was negative for obstructive uropathy did show fatty liver infiltrates, there is cholelithiasis but no acute cholecystitis there is mild circumferential thickening of the distal esophagus global enlargement of the uterus with thickened appearance of the endometrial stripe with radiology recommending nonemergent pelvic ultrasound.  Diagnostic evaluation notable for new AKI with serum creatinine 3.64, acute transaminitis leukocytosis, and hyponatremia of 127 she was admitted for further evaluation, cultures were sent, and she was started on IV ceftriaxone. ? ?On 5/3 patient was noted to be more confused, speech was slurred , Working diagnosis was #1 urinary tract infection with resultant sepsis and also low back pain for which neurosurgery was consulted as MRI finding showed acute herniated L3-L4 disc with left lumbar radiculopathy.  It was felt that pain control via lumbar injection may help in that this could be treated conservatively. ? ?On 5/4 patient becoming intermittently agitated then lethargic, because of this a CT of head was obtained this was severely limited due to motion degraded meant but there was concern about hypodense area in the right basal ganglia section raising concern for acute infarct.  On 5/5 a rapid response was called the patient was severely agitated, pulled out IVs, pull out Foley catheter ? ?Later that  afternoon mental status continued to worsen.  Neurology consult was obtained in addition to this infectious disease was consulted with urine growing E. coli and narrowed antibiotics to cefazolin stopped vancomycin and recommended supportive care because her mental status continued to decline, she had severe metabolic derangements, and we were unable to provide medical care on the [facility] she was transferred to the ICU for higher level of care.  On initial arrival she was agitated requiring several nurses to hold her down, tachypneic tachycardic would not follow commands had marked increased work of breathing.  An intraosseous line was placed in the right lower extremity, she was intubated for airway protection to facilitate further MRI imaging, and a right IJ triple-lumen catheter was placed ? ?Pertinent  Medical History  ?Class II obesity, hepatic steatosis, seasonal allergies, varicose veins diverticulosis, HL  ? ?Significant Hospital Events: ?Including procedures, antibiotic start and stop dates in addition to other pertinent events   ?5/3 admitted with back pain and urinary tract infection , Further complicated by AKI hyponatremia and multiple metabolic derangements.   ?CT imaging for renal stones showed no acute abdominal pelvic findings there was no obstructive uropathy there was severe fatty liver disease there was cholelithiasis but no cholecystitis there is colonic diverticulosis but no diverticulitis, mild circumferential thickening of the esophagus globular enlargement of the uterus with radiology recommending nonemergent pelvic ultrasound found to have uterine fibroid abdominal Ultrasound showed fatty liver but was epididymides negative.   ?MRI of the lumbar spine showed small left subarticular disc extrusion with inferior migration at L3-L4 correlating with radiculopathy.  She was started on ceftriaxone cultures were sent ?5/4 increased confusion CT  brain obtained raising concern for possible acute  infarct in the right basal ganglia ?5/5 progressive encephalopathy , Worsening leukocytosis, hypernatremia, hyperchloremia, slowly improving renal function, slowly improving procalcitonin, moved to ICU due to delirium, intubated for airway protection and to facilitate MRI imaging right IJ central line placed due to limited IV access.  Seen by neurology, also seen by infectious disease with ceftriaxone changed to cefazolin ?5/6 antibiotics changed to meningitis coverage given MRI findings of fluid in the ventricles.  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  DDAVP and volume resuscitation started ?5/11 hypernatremia slowly downtrending, continue DDAVP and IV resuscitation per nephrology ?5/12 Sodium downtrended to 147 this am ? ?Interim History / Subjective:  ?Patient underwent posterior cervical decompressive laminectomy of C3-T1 and evacuation of abscess ?Continued to spike fever with Tmax 101.1 ? ?Objective   ?Blood pressure (!) 117/56, pulse 87, temperature (!) 101.1 ?F (38.4 ?C), temperature source Axillary, resp. rate (!) 24, height '5\' 4"'$  (1.626 m), weight 102.2 kg, SpO2 99 %. ?CVP:  [5 mmHg-7 mmHg] 5 mmHg  ?Vent Mode: PSV;CPAP ?FiO2 (%):  [40 %] 40 % ?Set Rate:  [22 bmp-24 bmp] 22 bmp ?Vt Set:  [440 mL] 440 mL ?PEEP:  [5 cmH20] 5 cmH20 ?Pressure Support:  [10 cmH20] 10 cmH20 ?Plateau Pressure:  [10 KDT26-71 cmH20] 10 cmH20  ? ?Intake/Output Summary (Last 24 hours) at 02/25/2022 1005 ?Last data filed at 02/25/2022 0600 ?Gross per 24 hour  ?Intake 3701.93 ml  ?Output 3135 ml  ?Net 566.93 ml  ? ?Filed Weights  ? 02/22/22 0455 02/23/22 0500 02/23/22 2300  ?Weight: 106.8 kg 111.1 kg 102.2 kg  ? ?Physical exam ?General: Crtitically ill-appearing female, orally intubated ?HEENT: [place]/AT, eyes anicteric.  ETT  and OGT in place ?Neuro: Opens eyes with vocal stimuli, intermittently following few commands, she was able to squeeze with b both hands today, no response to painful stimuli in lower leg.  Sensation is intact ?Chest: Coarse breath sounds, no wheezes or rhonchi ?Heart: Regular rate and rhythm, no murmurs or gallops ?Abdomen: Soft, nontender, nondistended, bowel sounds present ?Skin: No rash  ? ?Assessment & Plan:  ?Principal Problem: ?  Sepsis secondary to UTI [facility]) ?Active Problems: ?  Hyponatremia ?  Hypokalemia ?  AKI (acute kidney injury) ([place]) ?  Abnormal LFTs ?  Class II obesity ?  Hepatic steatosis ?  Cholelithiasis ?  Esophageal thickening ?  Acute low back pain ?  Encephalopathy acute ?  Acute respiratory failure ([place]) ?  Radiculopathy of lumbar region ?  E. coli UTI ?  Meningitis ?  Bacterial meningitis ?  Diabetes insipidus ([place]) ?  H/O excision of lamina of cervical vertebra for decompression of spinal cord ? ? ?Acute metabolic/septic encephalopathy  ?Acute bacterial meningitis with E. Coli due to direct extension from epidural abscess ?Severe sepsis secondary to E. 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 coli ?MRI spine yesterday showed extensive abscesses extending from foreman magnum down to sacral spine ?Appreciate neurosurgery input, she underwent C3-T1 laminectomy and evacuation of abscess ?Continue IV Ceftriaxone, vancomycin was added per ID recommendations ?  ID would like to repeat blood cultures considering persistent fever ? ?Central DI  ?Hypernatremia ?Probably secondary to meningitis ?Serum sodium is stable now ?Patient did not require DDAVP in last 24 hours ?Continue free water flushes ?Monitor serum sodium ? ?Acute hypoxic respiratory failure   ?Difficult airway ?Continue ventilator support with lung protective strategies  ?Patient is tolerating spontaneous breathing trial but she had extensive spinal cord involvement with epidural abscess, won

## 2022-02-26 ENCOUNTER — Inpatient Hospital Stay: Payer: Self-pay

## 2022-02-26 ENCOUNTER — Encounter (HOSPITAL_COMMUNITY): Payer: Self-pay | Admitting: Neurological Surgery

## 2022-02-26 ENCOUNTER — Inpatient Hospital Stay (HOSPITAL_COMMUNITY): Payer: BC Managed Care – PPO

## 2022-02-26 DIAGNOSIS — A419 Sepsis, unspecified organism: Secondary | ICD-10-CM | POA: Diagnosis not present

## 2022-02-26 DIAGNOSIS — G061 Intraspinal abscess and granuloma: Secondary | ICD-10-CM | POA: Diagnosis present

## 2022-02-26 DIAGNOSIS — N390 Urinary tract infection, site not specified: Secondary | ICD-10-CM | POA: Diagnosis not present

## 2022-02-26 DIAGNOSIS — G039 Meningitis, unspecified: Secondary | ICD-10-CM | POA: Diagnosis not present

## 2022-02-26 DIAGNOSIS — N179 Acute kidney failure, unspecified: Secondary | ICD-10-CM | POA: Diagnosis not present

## 2022-02-26 DIAGNOSIS — M5440 Lumbago with sciatica, unspecified side: Secondary | ICD-10-CM | POA: Diagnosis not present

## 2022-02-26 LAB — GLUCOSE, CAPILLARY
Glucose-Capillary: 133 mg/dL — ABNORMAL HIGH (ref 70–99)
Glucose-Capillary: 146 mg/dL — ABNORMAL HIGH (ref 70–99)
Glucose-Capillary: 160 mg/dL — ABNORMAL HIGH (ref 70–99)
Glucose-Capillary: 215 mg/dL — ABNORMAL HIGH (ref 70–99)
Glucose-Capillary: 226 mg/dL — ABNORMAL HIGH (ref 70–99)
Glucose-Capillary: 229 mg/dL — ABNORMAL HIGH (ref 70–99)

## 2022-02-26 LAB — SODIUM
Sodium: 149 mmol/L — ABNORMAL HIGH (ref 135–145)
Sodium: 147 mmol/L — ABNORMAL HIGH (ref 135–145)
Sodium: 149 mmol/L — ABNORMAL HIGH (ref 135–145)
Sodium: 149 mmol/L — ABNORMAL HIGH (ref 135–145)
Sodium: 149 mmol/L — ABNORMAL HIGH (ref 135–145)
Sodium: 148 mmol/L — ABNORMAL HIGH (ref 135–145)

## 2022-02-26 LAB — BASIC METABOLIC PANEL
Anion gap: 8 (ref 5–15)
BUN: 29 mg/dL — ABNORMAL HIGH (ref 6–20)
CO2: 17 mmol/L — ABNORMAL LOW (ref 22–32)
Calcium: 7.6 mg/dL — ABNORMAL LOW (ref 8.9–10.3)
Chloride: 126 mmol/L — ABNORMAL HIGH (ref 98–111)
Creatinine, Ser: 1.2 mg/dL — ABNORMAL HIGH (ref 0.44–1.00)
GFR, Estimated: 53 mL/min — ABNORMAL LOW (ref >60)
Glucose, Bld: 231 mg/dL — ABNORMAL HIGH (ref 70–99)
Potassium: 3.6 mmol/L (ref 3.5–5.1)
Sodium: 151 mmol/L — ABNORMAL HIGH (ref 135–145)

## 2022-02-26 LAB — LACTIC ACID, PLASMA
Lactic Acid, Venous: 2.4 mmol/L (ref 0.5–1.9)
Lactic Acid, Venous: 1.8 mmol/L (ref 0.5–1.9)

## 2022-02-26 IMAGING — DX DG CHEST 1V PORT
1 series · 1 of 1 positions shown · non-contrast
Comparison: [DATE].

CLINICAL DATA: Sepsis.

EXAM:
PORTABLE CHEST 1 VIEW

[chest ap]
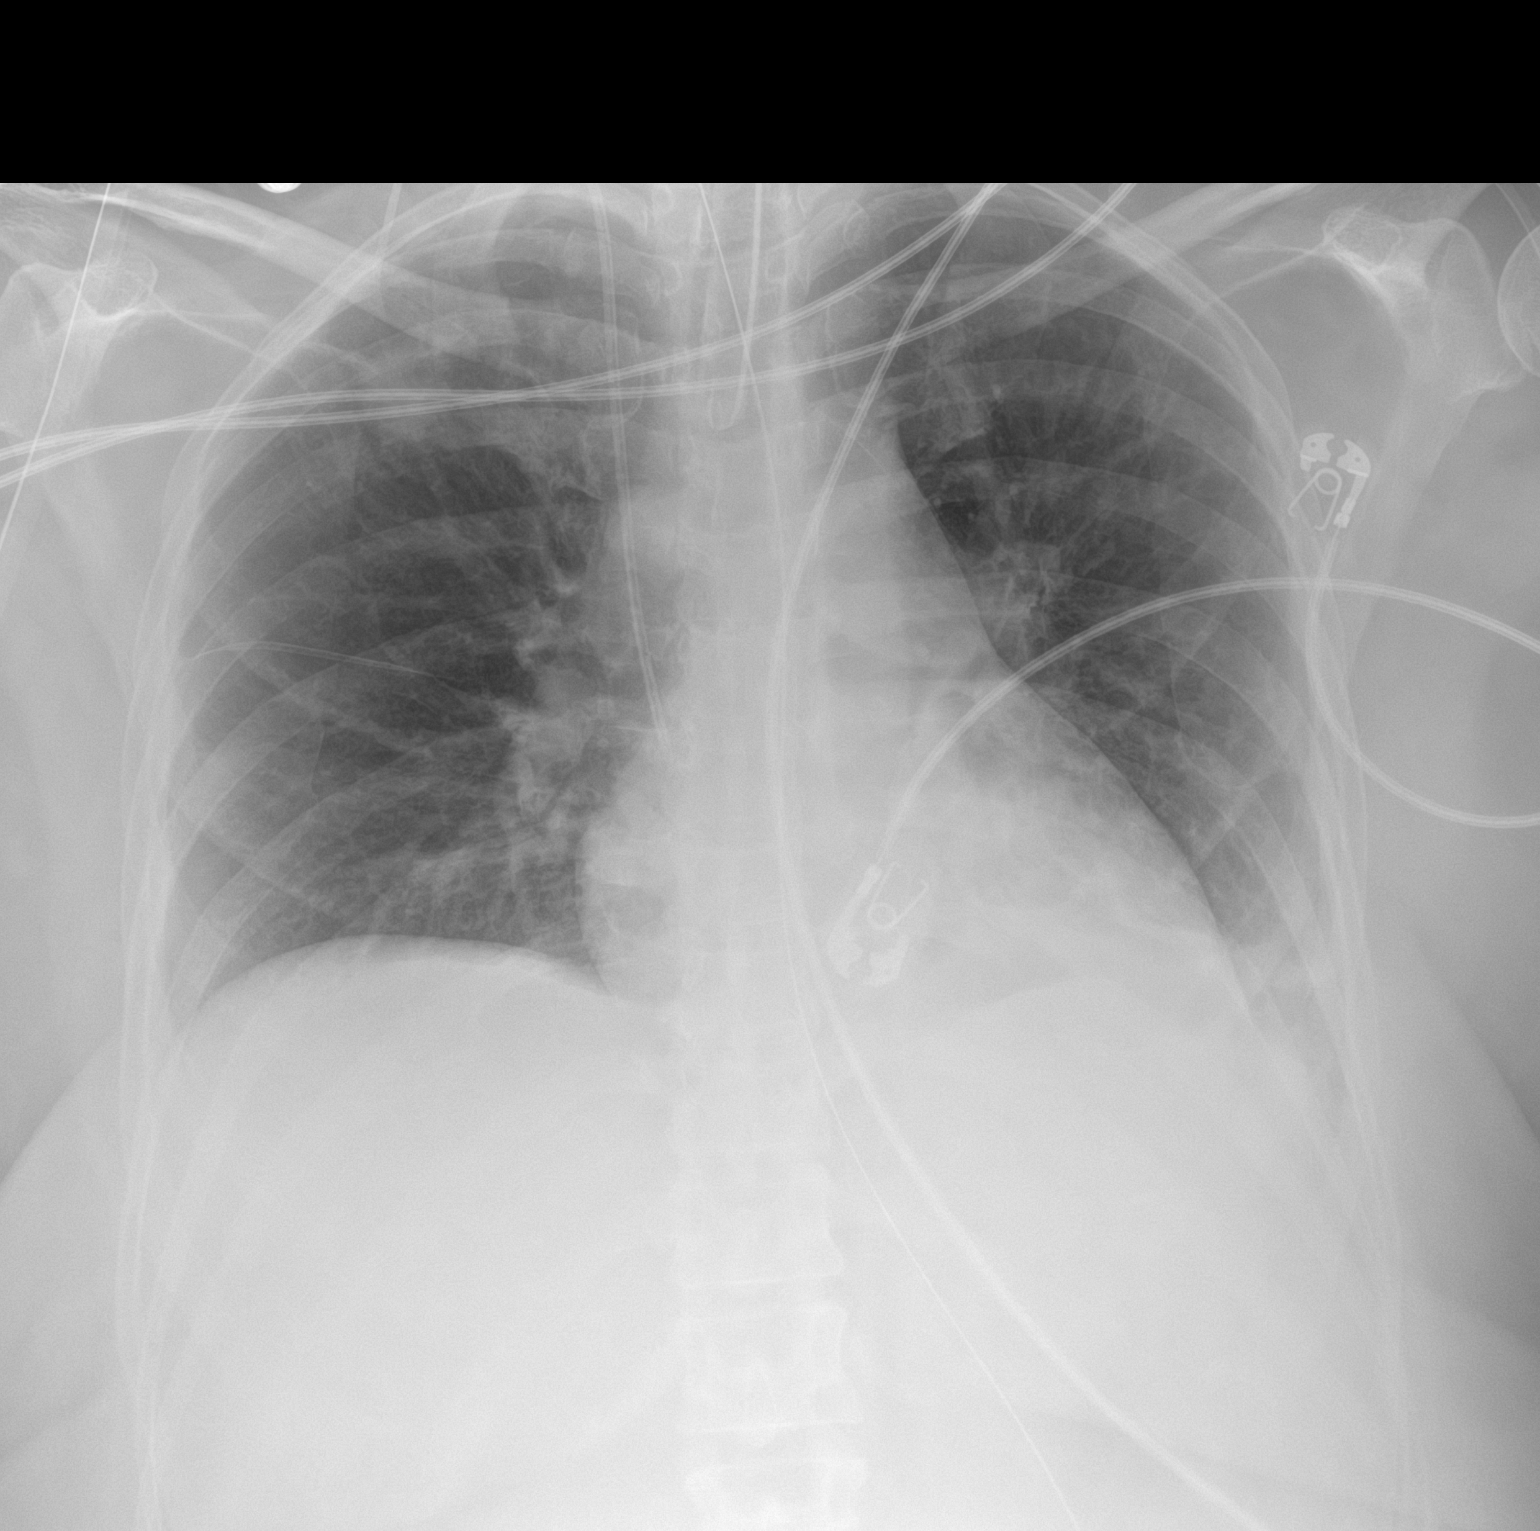

[1 of 1 positions shown; findings below may reference images not displayed]

FINDINGS: The heart size and mediastinal contours are within normal limits.
Endotracheal and nasogastric tubes are in grossly good position.
Right internal jugular catheter is noted with tip in expected
position of the SVC. Right lung is clear. Mild left basilar
subsegmental atelectasis is noted. The visualized skeletal
structures are unremarkable.
IMPRESSION: Stable support apparatus. Mild left basilar subsegmental
atelectasis.

## 2022-02-26 MED ORDER — SODIUM CHLORIDE 0.9% FLUSH
10.0000 mL | Freq: Two times a day (BID) | INTRAVENOUS | Status: DC
  Administered 2022-02-26 – 2022-03-06 (×13): 10 mL
  Administered 2022-03-07: 20 mL

## 2022-02-26 MED ORDER — HEPARIN SODIUM (PORCINE) 5000 UNIT/ML IJ SOLN
5000.0000 [IU] | Freq: Three times a day (TID) | INTRAMUSCULAR | Status: DC
  Administered 2022-02-26 – 2022-03-21 (×70): 5000 [IU] via SUBCUTANEOUS
  Filled 2022-02-26 (×69): qty 1

## 2022-02-26 MED ORDER — INSULIN ASPART 100 UNIT/ML IJ SOLN
4.0000 [IU] | INTRAMUSCULAR | Status: DC
  Administered 2022-02-26 – 2022-02-27 (×4): 4 [IU] via SUBCUTANEOUS

## 2022-02-26 MED ORDER — SODIUM CHLORIDE 0.9% FLUSH: 10.0000 mL | INTRAVENOUS | Status: DC | PRN

## 2022-02-26 NOTE — Progress Notes (Signed)
Nutrition Follow-up ? ?DOCUMENTATION CODES:  ? ?Obesity unspecified ? ?INTERVENTION:  ? ?Tube feeding via OG tube: ?Osmolite 1.2 at 60 ml/h (1440 ml per day) ?Prosource TF 45 ml TID ? ?Provides 1848 kcal, 113 gm protein, 1181 ml free water daily ? ?200 ml free water every 6 hours  ?Total free water: 1981 ml  ? ?NUTRITION DIAGNOSIS:  ? ?Inadequate oral intake related to inability to eat as evidenced by NPO status. ?Ongoing.  ? ?GOAL:  ? ?Provide needs based on ASPEN/SCCM guidelines ?Progressing.  ? ?MONITOR:  ? ?Vent status, TF tolerance, Labs, Weight trends, Skin ? ?REASON FOR ASSESSMENT:  ? ?Consult ?Enteral/tube feeding initiation and management ? ?ASSESSMENT:  ? ?56 y.o. female with medical history of seasonal allergies, obesity, BLE swelling, varicose veins, HLD, hepatic steatosis, atherosclerosis, and diverticulosis. She presented to the ED due to severe low back pain that radiated to LLE and was associated with hematuria and dysuria. In the ED, she was noted to have multiple electrolyte abnormalities. CT renal stone study showed no obstructive uropathy, severe fatty infiltration of the liver with mild hepatomegaly, cholelithiasis with no acute cholecystitis, colonic diverticulosis, and mild circumferential thickening of the distal esophagus may represent esophagitis. She was admitted for sepsis 2/2 UTI. ? ?Pt discussed during ICU rounds and with RN and MD. Per CCM may need trach.  ?  ?Admission weight: 95.3 kg ?Current weight: 102.2 kg  ?Moderate edema noted  ? ?5/3 admitted with back pain and UTI ?5/5 intubated with progressive encephalopathy ?5/8 s/p LP consistent with bacterial meningitis ?5/10 developed DI due to meningitis  ?5/13 s/p OR for decompressive laminectomy and evacuation of epidural abscess ? ? ?Medications reviewed and include: colace, SSI, novolog, protonix, miralax  ?Precedex  ?Fentanyl  ? ?Labs reviewed: Na 149, TG: 438 ?Vitamin B12: 4495 (5/5)  ?CBG's: 226-229 ? ?OG tube in place since 5/5;  tip in gastric body per xray  ? ?Diet Order:   ?Diet Order   ? ?       ?  Diet NPO time specified  Diet effective midnight       ?  ? ?  ?  ? ?  ? ? ?EDUCATION NEEDS:  ? ?No education needs have been identified at this time ? ?Skin:  Skin Assessment: Reviewed RN Assessment ? ?Last BM:  5/14 ? ?Height:  ? ?Ht Readings from Last 1 Encounters:  ?02/21/22 '5\' 4"'$  (1.626 m)  ? ? ?Weight:  ? ?Wt Readings from Last 1 Encounters:  ?02/23/22 102.2 kg  ? ? ?Ideal Body Weight:  54.54 kg ? ?BMI:  Body mass index is 38.67 kg/m?. ? ?Estimated Nutritional Needs:  ? ?Kcal:  1827 kcal ? ?Protein:  >/= 103 grams protein ? ?Fluid:  >/= 1.5 L/day ? ?Lockie Pares., RD, LDN, CNSC ?See AMiON for contact information  ? ? ?

## 2022-02-26 NOTE — Progress Notes (Signed)
Placed a phone call to MRI and spoke with one of the MRI techs to plan time for ordered MRI. Per MRI, no availability for scanner is eminently open. MRI tech to return call to this RN when availability for scan is open. ?

## 2022-02-26 NOTE — Plan of Care (Signed)
Patient remains intubated, sedated. Plan for MRI overnight tonight. ? ? ?Problem: Education: ?Goal: Knowledge of General Education information will improve ?Description: Including pain rating scale, medication(s)/side effects and non-pharmacologic comfort measures ?Outcome: Progressing ?  ?Problem: Health Behavior/Discharge Planning: ?Goal: Ability to manage health-related needs will improve ?Outcome: Progressing ?  ?Problem: Clinical Measurements: ?Goal: Ability to maintain clinical measurements within normal limits will improve ?Outcome: Progressing ?Goal: Will remain free from infection ?Outcome: Progressing ?Goal: Diagnostic test results will improve ?Outcome: Progressing ?Goal: Respiratory complications will improve ?Outcome: Progressing ?Goal: Cardiovascular complication will be avoided ?Outcome: Progressing ?  ?Problem: Activity: ?Goal: Risk for activity intolerance will decrease ?Outcome: Progressing ?  ?Problem: Nutrition: ?Goal: Adequate nutrition will be maintained ?Outcome: Progressing ?  ?Problem: Coping: ?Goal: Level of anxiety will decrease ?Outcome: Progressing ?  ?Problem: Elimination: ?Goal: Will not experience complications related to bowel motility ?Outcome: Progressing ?Goal: Will not experience complications related to urinary retention ?Outcome: Progressing ?  ?Problem: Pain Managment: ?Goal: General experience of comfort will improve ?Outcome: Progressing ?  ?Problem: Safety: ?Goal: Ability to remain free from injury will improve ?Outcome: Progressing ?  ?Problem: Skin Integrity: ?Goal: Risk for impaired skin integrity will decrease ?Outcome: Progressing ?  ?Problem: Urinary Elimination: ?Goal: Signs and symptoms of infection will decrease ?Outcome: Progressing ?  ?

## 2022-02-26 NOTE — Progress Notes (Signed)
Peripherally Inserted Central Catheter Placement ? ?The IV Nurse has discussed with the patient and/or persons authorized to consent for the patient, the purpose of this procedure and the potential benefits and risks involved with this procedure.  The benefits include less needle sticks, lab draws from the catheter, and the patient may be discharged home with the catheter. Risks include, but not limited to, infection, bleeding, blood clot (thrombus formation), and puncture of an artery; nerve damage and irregular heartbeat and possibility to perform a PICC exchange if needed/ordered by physician.  Alternatives to this procedure were also discussed.  Bard Power PICC patient education guide, fact sheet on infection prevention and patient information card has been provided to patient /or left at bedside.   ? ?PICC Placement Documentation  ?PICC Triple Lumen 78/93/81 Right Basilic 41 cm 0 cm (Active)  ?Indication for Insertion or Continuance of Line Prolonged intravenous therapies 02/26/22 1500  ?Exposed Catheter (cm) 0 cm 02/26/22 1500  ?Site Assessment Clean, Dry, Intact 02/26/22 1500  ?Lumen #1 Status Flushed;Blood return noted;Saline locked 02/26/22 1500  ?Lumen #2 Status Flushed;Blood return noted;Saline locked 02/26/22 1500  ?Lumen #3 Status Flushed;Blood return noted;Saline locked 02/26/22 1500  ?Dressing Type Securing device;Transparent 02/26/22 1500  ?Dressing Status Clean, Dry, Intact;Antimicrobial disc in place 02/26/22 1500  ?Dressing Change Due 03/05/22 02/26/22 1500  ? ? ? ? ? ?Jule Economy Horton ?02/26/2022, 3:18 PM ? ?

## 2022-02-26 NOTE — Progress Notes (Signed)
? ?[facility] Infectious Diseases Follow Up Note ? ?Patient Identification: ?Patient Name: Gabrielle Vincent MRN: [id number] [name] Date: 02/14/2022  9:53 AM ?Age: 56 y.o.Today's Date: 02/26/2022 ? ?Reason for Visit: E. coli meningitis and epidural abscess ? ?Principal Problem: ?  Sepsis secondary to UTI [facility]) ?Active Problems: ?  Hyponatremia ?  Hypokalemia ?  AKI (acute kidney injury) ([place]) ?  Abnormal LFTs ?  Class II obesity ?  Hepatic steatosis ?  Cholelithiasis ?  Esophageal thickening ?  Acute low back pain ?  Encephalopathy acute ?  Acute respiratory failure ([place]) ?  Radiculopathy of lumbar region ?  E. coli UTI ?  Meningitis ?  Bacterial meningitis ?  Diabetes insipidus ([place]) ?  H/O excision of lamina of cervical vertebra for decompression of spinal cord ?  Abscess in epidural space of cervical spine ? ? ?Antibiotics:  ?Ceftriaxone 5/3-5/5, cefepime 5/6-5/9, ceftriaxone 5/9-c ?Vancomycin 5/5-5/8, 5/14  ?Ampicillin 5/6-5/8 ?Acyclovir 5/6-5/8 ? ?Lines/Hardwares: Right IJ CVC ? ?Interval Events: Tmax 101.4 in the last 24 hours, WBC downtrending ? ? ?Assessment ?# E. coli UTI complicated with epidural abscess throughout T L with possible extension to C spine and meningitis/associated severe diffuse spinal stenosis ? ?# L4-L5 and L5-s1 septic arthritis ?- 5/7 CSF Cx with E coli  ?-5/13 for posterior cervical decompression, laminectomy and medial facetectomy C3-T1 with instrumented fusion from C3-C7. OR cx with E coli  ? ?#Acute hypoxemic respiratory failure: Status post mechanical ventilation for airway protection ?#Newly diagnosed DM: a1c 8.9 ?# Fevers ?# AKI: Cr appears stable/improving ? ?Recommendations ?Ceftriaxone 2 g IV every 12 ?Continue vancomycin, pharmacy to dose empirically pending blood cultures  ?Fu pending cultures  ?Monitor fever curve and WBC. Concerned  if ongoing fevers related to source control issue in the spine.  ?Fu MRI C spine and Neurosurgery  plans  ?Will order TTE  ?Following ? ?Rest of the management as per the primary team. ?Thank you for the consult. Please page with pertinent questions or concerns. ? ?______________________________________________________________________ ?Subjective ?patient seen and examined at the bedside.  ?Brother at bedside  ?Per RN, moves UE occasionally ? ?Vitals ?BP (!) 122/59   Pulse 86   Temp 98.4 ?F (36.9 ?C) (Axillary)   Resp (!) 23   Ht '5\' 4"'$  (1.626 m)   Wt 102.2 kg   LMP  (LMP Unknown)   SpO2 99%   BMI 38.67 kg/m?  ? ?  ?Physical Exam ?Constitutional: Orotracheally intubated, on Precedex and fentanyl ?   Comments:  ? ?Cardiovascular:  ?   Rate and Rhythm: Normal rate and regular rhythm.  ?   Heart sounds:  ? ?Pulmonary:  ?   Effort: Pulmonary effort is normal on mechanical ventilation ?   Comments:  ? ?Abdominal:  ?   Palpations: Abdomen is soft.  ?   Tenderness: Nondistended ? ?Musculoskeletal:     ?   General: No swelling or erythema noted in peripheral joints, bilateral pedal edema  ? ?Skin: ?   Comments: No obvious rashes. RT arm PICC+ ? ?Neurological:  ?   General: Unable to assess, did not follow commands for me but resisted opening eyes ? ?Pertinent Microbiology ?Results for orders placed or performed during the hospital encounter of 02/14/22  ?Urine Culture     Status: Abnormal  ? Collection Time: 02/14/22 10:40 AM  ? Specimen: Urine, Clean Catch  ?Result Value Ref Range Status  ? Specimen Description   Final  ?  URINE, CLEAN CATCH ?Performed at [facility], [address], [place], [place] [address] ?  ? Special Requests   Final  ?  NONE ?Performed at [facility], [address] [place], [place] [address] ?  ? Culture >=100,000 COLONIES/mL ESCHERICHIA COLI (A)  Final  ? Report Status 02/16/2022 FINAL  Final  ? Organism ID, Bacteria ESCHERICHIA COLI (A)  Final  ?    Susceptibility  ? Escherichia coli - MIC*  ?  AMPICILLIN >=32 RESISTANT Resistant   ?  CEFAZOLIN  <=4 SENSITIVE Sensitive   ?  CEFEPIME <=0.12 SENSITIVE Sensitive   ?  CEFTRIAXONE <=0.25 SENSITIVE Sensitive   ?  CIPROFLOXACIN <=0.25 SENSITIVE Sensitive   ?  GENTAMICIN <=1 SENSITIVE Sensitive   ?  IMIPENEM <=0.25 SENSITIVE Sensitive   ?  NITROFURANTOIN <=16 SENSITIVE Sensitive   ?  TRIMETH/SULFA <=20 SENSITIVE Sensitive   ?  AMPICILLIN/SULBACTAM >=32 RESISTANT Resistant   ?  PIP/TAZO <=4 SENSITIVE Sensitive   ?  * >=100,000 COLONIES/mL ESCHERICHIA COLI  ?Culture, blood (Routine X 2) w Reflex to ID Panel     Status: None  ? Collection Time: 02/15/22 11:11 AM  ? Specimen: BLOOD  ?Result Value Ref Range Status  ? Specimen Description   Final  ?  BLOOD BLOOD RIGHT HAND ?Performed at [facility], [place] [address] [place], [place] [address] ?  ? Special Requests   Final  ?  BOTTLES DRAWN AEROBIC AND ANAEROBIC Blood Culture adequate volume ?Performed at [facility], [place] [address] [place], [place] [address] ?  ? Culture   Final  ?  NO GROWTH 5 DAYS ?Performed at [facility], [place] [address], [place], [place] [address] ?  ? Report Status 02/20/2022 FINAL  Final  ?Culture, blood (Routine X 2) w Reflex to ID Panel     Status: None  ? Collection Time: 02/15/22 11:21 AM  ? Specimen: BLOOD  ?Result Value Ref Range Status  ? Specimen Description   Final  ?  BLOOD BLOOD LEFT HAND ?Performed at [facility], [place] [address], [place], [place] [address] ?  ? Special Requests   Final  ?  BOTTLES DRAWN AEROBIC AND ANAEROBIC Blood Culture adequate volume ?Performed at [facility], [place] [address] [place], [place] [address] ?  ? Culture   Final  ?  NO GROWTH 5 DAYS ?Performed at [facility], [place] [address] [place], [place] [address] ?  ? Report Status 02/20/2022 FINAL  Final  ?MRSA Next Gen by PCR, Nasal     Status: None  ? Collection Time: 02/16/22  3:06 PM  ? Specimen: Nasal Mucosa; Nasal Swab  ?Result Value Ref Range Status  ? MRSA by PCR  Next Gen NOT DETECTED NOT DETECTED Final  ?  Comment: (NOTE) ?The GeneXpert MRSA Assay (FDA approved for NASAL specimens only), ?is one component of a comprehensive MRSA colonization surveillance ?program. It is not intended to diagnose MRSA infection nor to guide ?or monitor treatment for MRSA infections. ?Test performance is not FDA approved in patients less than 2 years ?old. ?Performed at [facility], [place] [address] ?[place],  [address] ?  ?CSF culture w Gram Stain     Status: None  ? Collection Time: 02/18/22  1:45 PM  ? Specimen: PATH Cytology CSF; Cerebrospinal Fluid  ?Result Value Ref Range Status  ? Specimen Description   Final  ?  CSF ?Performed at [facility], [place] [address]  [place], [place] [address] ?  ? Special Requests   Final  ?  NONE ?Performed at [facility], [place] [address] [place], [place] [address] ?  ? Gram Stain   Final  ?  WBC PRESENT,BOTH PMN AND MONONUCLEAR ?CYTOSPIN SMEAR ?RESULT CALLED TO, READ BACK BY AND VERIFIED WITH: Tyson Babinski RN AT 5462 02/18/22 BY TIBBITTS,K ?CORRECTED RESULTS GRAM NEGATIVE RODS ?PREVIOUSLY REPORTED AS: GRAM POSITIVE COCCI ?CORRECTED RESULTS CALLED TO: PHARMD M.TUCKER AT 1133 ON 02/20/2022 BY T.SAAD. ?  ? Culture   Final  ?  MODERATE ESCHERICHIA COLI ?CRITICAL RESULT CALLED TO, READ BACK BY AND VERIFIED WITH: RN M.SPENS AT 1254 ON 02/19/2022 BY T.SAAD. ?Performed at [facility], [place] [address] [place], [place] [address] ?  ? Report Status 02/20/2022 FINAL  Final  ? Organism ID, Bacteria ESCHERICHIA COLI  Final  ?    Susceptibility  ? Escherichia coli - MIC*  ?  AMPICILLIN >=32 RESISTANT Resistant   ?  CEFAZOLIN <=4 SENSITIVE Sensitive   ?  CEFEPIME <=0.12 SENSITIVE Sensitive   ?  CEFTAZIDIME <=1 SENSITIVE Sensitive   ?  CEFTRIAXONE <=0.25 SENSITIVE Sensitive   ?  CIPROFLOXACIN <=0.25 SENSITIVE Sensitive   ?  GENTAMICIN <=1 SENSITIVE Sensitive   ?  IMIPENEM <=0.25 SENSITIVE Sensitive   ?   TRIMETH/SULFA <=20 SENSITIVE Sensitive   ?  AMPICILLIN/SULBACTAM 16 INTERMEDIATE Intermediate   ?  PIP/TAZO <=4 SENSITIVE Sensitive   ?  * MODERATE ESCHERICHIA COLI  ?MRSA Next Gen by PCR, Nasal     Status: None

## 2022-02-26 NOTE — Progress Notes (Signed)
? ?NAME:  Gabrielle Vincent, MRN:  [id number], DOB:  24-Feb-1966, LOS: 12 ?ADMISSION DATE:  02/14/2022, CONSULTATION DATE:  5/5 ?REFERRING MD:  Rai, CHIEF COMPLAINT:  acute encephalopathy and sepsis   ? ?History of Present Illness:  ?56 year old female who was admitted on 5/3 with chief complaint of severe low back pain radiating down her left leg.  Also had associated hematuria and dysuria.  She had no weakness although low did say pain impacted her ability to walk.  Also noted poor p.o. intake, decreased urination as well as urinary retention, hematuria, dysuria, and some nausea.  ? ?CT scan was negative for obstructive uropathy did show fatty liver infiltrates, there is cholelithiasis but no acute cholecystitis there is mild circumferential thickening of the distal esophagus global enlargement of the uterus with thickened appearance of the endometrial stripe with radiology recommending nonemergent pelvic ultrasound.  Diagnostic evaluation notable for new AKI with serum creatinine 3.64, acute transaminitis leukocytosis, and hyponatremia of 127 she was admitted for further evaluation, cultures were sent, and she was started on IV ceftriaxone. ? ?On 5/3 patient was noted to be more confused, speech was slurred , Working diagnosis was #1 urinary tract infection with resultant sepsis and also low back pain for which neurosurgery was consulted as MRI finding showed acute herniated L3-L4 disc with left lumbar radiculopathy.  It was felt that pain control via lumbar injection may help in that this could be treated conservatively. ? ?On 5/4 patient becoming intermittently agitated then lethargic, because of this a CT of head was obtained this was severely limited due to motion degraded meant but there was concern about hypodense area in the right basal ganglia section raising concern for acute infarct.  On 5/5 a rapid response was called the patient was severely agitated, pulled out IVs, pull out Foley catheter ? ?Later that  afternoon mental status continued to worsen.  Neurology consult was obtained in addition to this infectious disease was consulted with urine growing E. coli and narrowed antibiotics to cefazolin stopped vancomycin and recommended supportive care because her mental status continued to decline, she had severe metabolic derangements, and we were unable to provide medical care on the [place] ward she was transferred to the ICU for higher level of care.  On initial arrival she was agitated requiring several nurses to hold her down, tachypneic tachycardic would not follow commands had marked increased work of breathing.  An intraosseous line was placed in the right lower extremity, she was intubated for airway protection to facilitate further MRI imaging, and a right IJ triple-lumen catheter was placed ? ?Pertinent  Medical History  ?Class II obesity, hepatic steatosis, seasonal allergies, varicose veins diverticulosis, HL  ? ?Significant Hospital Events: ?Including procedures, antibiotic start and stop dates in addition to other pertinent events   ?5/3 admitted with back pain and urinary tract infection , Further complicated by AKI hyponatremia and multiple metabolic derangements.   ?CT imaging for renal stones showed no acute abdominal pelvic findings there was no obstructive uropathy there was severe fatty liver disease there was cholelithiasis but no cholecystitis there is colonic diverticulosis but no diverticulitis, mild circumferential thickening of the esophagus globular enlargement of the uterus with radiology recommending nonemergent pelvic ultrasound found to have uterine fibroid abdominal Ultrasound showed fatty liver but was epididymides negative.   ?MRI of the lumbar spine showed small left subarticular disc extrusion with inferior migration at L3-L4 correlating with radiculopathy.  She was started on ceftriaxone cultures were sent ?5/4 increased confusion CT  brain obtained raising concern for possible acute  infarct in the right basal ganglia ?5/5 progressive encephalopathy , Worsening leukocytosis, hypernatremia, hyperchloremia, slowly improving renal function, slowly improving procalcitonin, moved to ICU due to delirium, intubated for airway protection and to facilitate MRI imaging right IJ central line placed due to limited IV access.  Seen by neurology, also seen by infectious disease with ceftriaxone changed to cefazolin ?5/6 antibiotics changed to meningitis coverage given MRI findings of fluid in the ventricles.  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  DDAVP and volume resuscitation started ?5/11 hypernatremia slowly downtrending, continue DDAVP and IV resuscitation per nephrology ?5/12 Sodium downtrended to 147 this am ?5/13 to OR for posterior cervical decompressive laminectomy and medial facetectomy C3-T1 for evacuation of epirudal abscess, posterior cervical arthrodesis C3-C7, segmental fixation C3-7 ?Interim History / Subjective:  ?NAEON. ? ?Objective   ?Blood pressure 114/61, pulse 78, temperature 99.9 ?F (37.7 ?C), temperature source Axillary, resp. rate (!) 22, height '5\' 4"'$  (1.626 m), weight 102.2 kg, SpO2 98 %. ?   ?Vent Mode: PSV;CPAP ?FiO2 (%):  [40 %] 40 % ?Set Rate:  [24 bmp] 24 bmp ?Vt Set:  [440 mL] 440 mL ?PEEP:  [5 cmH20] 5 cmH20 ?Pressure Support:  [5 cmH20-8 cmH20] 5 cmH20 ?Plateau Pressure:  [15 cmH20] 15 cmH20  ? ?Intake/Output Summary (Last 24 hours) at 02/26/2022 0919 ?Last data filed at 02/26/2022 0600 ?Gross per 24 hour  ?Intake 2761.73 ml  ?Output 3450 ml  ?Net -688.27 ml  ? ? ?Filed Weights  ? 02/22/22 0455 02/23/22 0500 02/23/22 2300  ?Weight: 106.8 kg 111.1 kg 102.2 kg  ? ?Physical exam ?General: Crtitically ill-appearing female, orally intubated ?HEENT: [place]/AT, eyes  anicteric.  ETT in place ?Neuro: Opens eyes with noxious stimuli.  Does not follow or withdraw  ?Chest: Coarse breath sounds, no wheezes or rhonchi ?Heart: RRR, no M/R/G ?Abdomen: Soft, nontender, nondistended, bowel sounds present ?Skin: No rash  ? ?Assessment & Plan:  ?Principal Problem: ?  Sepsis secondary to UTI [facility]) ?Active Problems: ?  Hyponatremia ?  Hypokalemia ?  AKI (acute kidney injury) ([place]) ?  Abnormal LFTs ?  Class II obesity ?  Hepatic steatosis ?  Cholelithiasis ?  Esophageal thickening ?  Acute low back pain ?  Encephalopathy acute ?  Acute respiratory failure ([place]) ?  Radiculopathy of lumbar region ?  E. coli UTI ?  Meningitis ?  Bacterial meningitis ?  Diabetes insipidus ([place]) ?  H/O excision of lamina of cervical vertebra for decompression of spinal cord ?  Abscess in epidural space of cervical spine ? ? ?Acute metabolic/septic encephalopathy  ?Acute bacterial meningitis with E. Coli due to direct extension from epidural abscess ?Severe sepsis secondary to E. coli UTI and E.coli meningitis ?Extensive epidural abscess extending from foramen magnum to the sacral spine s/p C3-T1 laminectomy and evacuation of abscess ?Continue supportive care  ?Continue abx (ceftriaxone, vancomycin) ?Neurosurgery and ID following ?Continue IV Ceftriaxone, vancomycin was added per ID recommendations ?F/u repeat blood cultures 5/14 ?Get CXR ? 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 ?Acute hypoxic respiratory failure  ?Difficult airway ?Continue ventilator support with lung protective strategies  ?Patient is tolerating spontaneous breathing trial but  she had extensive spinal cord involvement with epidural abscess, wonder if her diaphragmatic nerves are involved ?Repeat CXR ?She may end up requiring tracheostomy ? ?Shock liver superimposed on known fatty liver disease - improved ?Follow LFT's intermittently ? ?Anemia of  critical illness ?Monitor H&H and transfuse if less than 7 ? ?Best Practice (right click and "Reselect all SmartList Selections" daily)  ? ?Diet/type: NPO, tube feeds ?DVT prophylaxis: prophylactic heparin  ?GI

## 2022-02-26 NOTE — Progress Notes (Signed)
Patient ID: Gabrielle Vincent, female   DOB: [birth year], 56 y.o.   MRN: [id number] ?Patient appears awake and follows commands with eyes.  Trace minimal movement in the fingertips barely.  Nurses note that movements seem better yesterday.  Drain with very modest output.  Patient still appears to have significant pain especially with any movement. ? ?I reviewed and discussed the situation with Dr. Ronnald Ramp at some length.  Concern is for further trapped pus further down her spinal canal.  I have advised at this point that we should obtain a new MRI of the thoracic spine.  This will give [place] a gauge as to how well her cervical spine is decompressed and whether any of the thoracic epidural has relieved itself into the cervical space.  Further surgery may be contemplated based on those findings. ?

## 2022-02-27 ENCOUNTER — Inpatient Hospital Stay (HOSPITAL_COMMUNITY): Payer: BC Managed Care – PPO

## 2022-02-27 DIAGNOSIS — Z9911 Dependence on respirator [ventilator] status: Secondary | ICD-10-CM | POA: Diagnosis not present

## 2022-02-27 DIAGNOSIS — R509 Fever, unspecified: Secondary | ICD-10-CM | POA: Diagnosis not present

## 2022-02-27 DIAGNOSIS — A419 Sepsis, unspecified organism: Secondary | ICD-10-CM | POA: Diagnosis not present

## 2022-02-27 DIAGNOSIS — J9601 Acute respiratory failure with hypoxia: Secondary | ICD-10-CM | POA: Diagnosis not present

## 2022-02-27 DIAGNOSIS — N390 Urinary tract infection, site not specified: Secondary | ICD-10-CM | POA: Diagnosis not present

## 2022-02-27 DIAGNOSIS — G061 Intraspinal abscess and granuloma: Secondary | ICD-10-CM | POA: Diagnosis not present

## 2022-02-27 LAB — CBC
HCT: 29.2 % — ABNORMAL LOW (ref 36.0–46.0)
Hemoglobin: 9.1 g/dL — ABNORMAL LOW (ref 12.0–15.0)
MCH: 30.1 pg (ref 26.0–34.0)
MCHC: 31.2 g/dL (ref 30.0–36.0)
MCV: 96.7 fL (ref 80.0–100.0)
Platelets: 171 10*3/uL (ref 150–400)
RBC: 3.02 MIL/uL — ABNORMAL LOW (ref 3.87–5.11)
RDW: 17.4 % — ABNORMAL HIGH (ref 11.5–15.5)
WBC: 7.3 10*3/uL (ref 4.0–10.5)
nRBC: 0 % (ref 0.0–0.2)

## 2022-02-27 LAB — BASIC METABOLIC PANEL
Anion gap: 5 (ref 5–15)
BUN: 26 mg/dL — ABNORMAL HIGH (ref 6–20)
CO2: 18 mmol/L — ABNORMAL LOW (ref 22–32)
Calcium: 7.3 mg/dL — ABNORMAL LOW (ref 8.9–10.3)
Chloride: 124 mmol/L — ABNORMAL HIGH (ref 98–111)
Creatinine, Ser: 1.05 mg/dL — ABNORMAL HIGH (ref 0.44–1.00)
GFR, Estimated: >60 mL/min (ref >60)
Glucose, Bld: 216 mg/dL — ABNORMAL HIGH (ref 70–99)
Potassium: 3.4 mmol/L — ABNORMAL LOW (ref 3.5–5.1)
Sodium: 147 mmol/L — ABNORMAL HIGH (ref 135–145)

## 2022-02-27 LAB — ECHOCARDIOGRAM COMPLETE
Area-P 1/2: 3.85 cm2
Calc EF: 52.2 %
Height: 64 in
S' Lateral: 2.8 cm
Single Plane A2C EF: 52.5 %
Single Plane A4C EF: 52.9 %
Weight: 3643.76 oz

## 2022-02-27 LAB — GLUCOSE, CAPILLARY
Glucose-Capillary: 154 mg/dL — ABNORMAL HIGH (ref 70–99)
Glucose-Capillary: 193 mg/dL — ABNORMAL HIGH (ref 70–99)
Glucose-Capillary: 198 mg/dL — ABNORMAL HIGH (ref 70–99)
Glucose-Capillary: 225 mg/dL — ABNORMAL HIGH (ref 70–99)
Glucose-Capillary: 227 mg/dL — ABNORMAL HIGH (ref 70–99)
Glucose-Capillary: 239 mg/dL — ABNORMAL HIGH (ref 70–99)

## 2022-02-27 LAB — PHOSPHORUS: Phosphorus: 2.1 mg/dL — ABNORMAL LOW (ref 2.5–4.6)

## 2022-02-27 LAB — MAGNESIUM: Magnesium: 1.9 mg/dL (ref 1.7–2.4)

## 2022-02-27 LAB — SODIUM
Sodium: 132 mmol/L — ABNORMAL LOW (ref 135–145)
Sodium: 146 mmol/L — ABNORMAL HIGH (ref 135–145)
Sodium: 148 mmol/L — ABNORMAL HIGH (ref 135–145)
Sodium: 149 mmol/L — ABNORMAL HIGH (ref 135–145)
Sodium: 149 mmol/L — ABNORMAL HIGH (ref 135–145)

## 2022-02-27 IMAGING — MR MR THORACIC SPINE WO/W CM
4 of 9 series · 19 of 48 positions shown · IV contrast (gadavist)
Comparison: MRI thoracic spine [DATE]

CLINICAL DATA: Acute myelopathy. Follow-up spinal epidural abscess.
Status post cervical decompression.

EXAM:
MRI THORACIC WITHOUT AND WITH CONTRAST
TECHNIQUE: Multiplanar and multiecho pulse sequences of the thoracic spine were
obtained without and with intravenous contrast.
CONTRAST:  10mL GADAVIST GADOBUTROL 1 MMOL/ML IV SOLN

[Series 4: T2 · sagittal · 3.0mm · 0.62mm/px · 2 of 14 slices shown (1 of 2)]
[im 1/14]
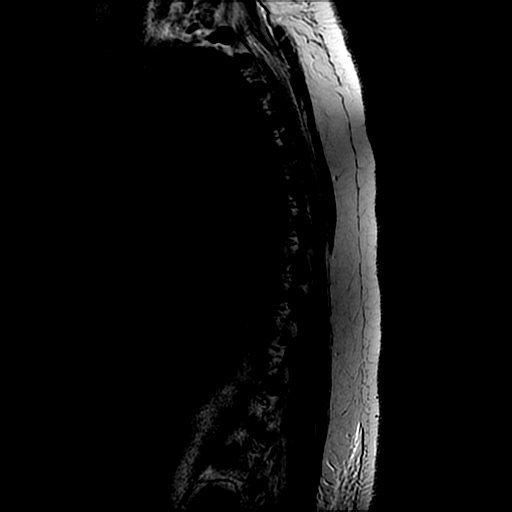
[im 14/14]
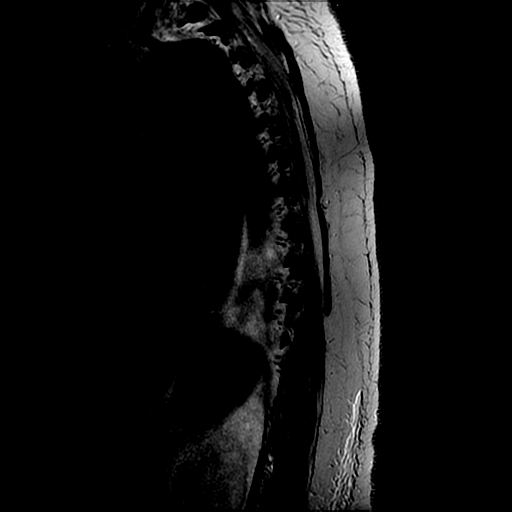

[Series 5: T1 · sagittal · 3.0mm · 0.62mm/px · 3 of 14 slices shown (1 of 2)]
[im 1/14]
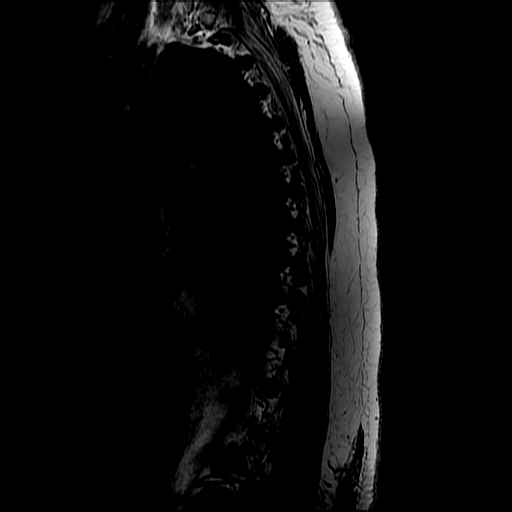
[im 7/14]
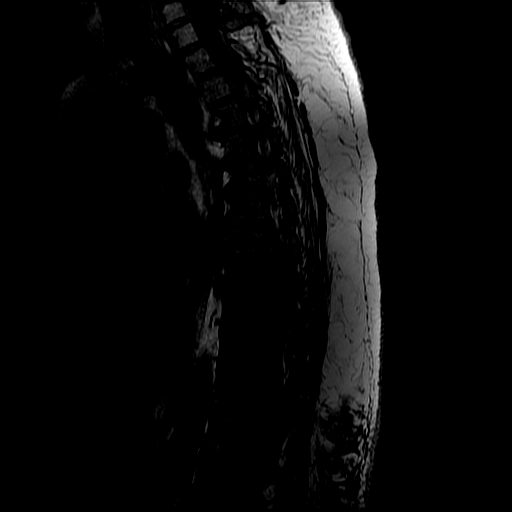
[im 14/14]
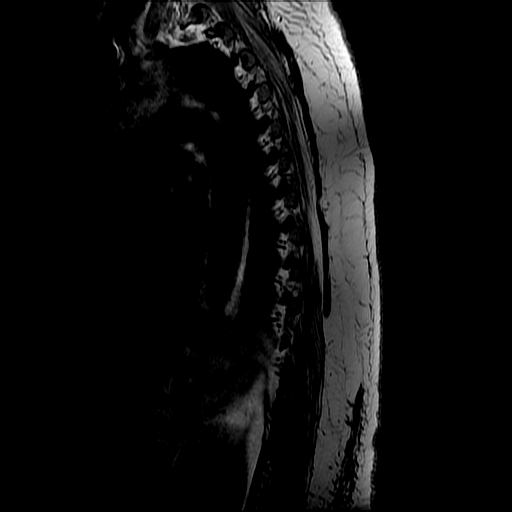

[Series 7: T2 · axial · 4.0mm · 0.43mm/px · z∈[-249,-27]mm · 9 of 42 slices shown (2 of 2)]
[im 1/42]
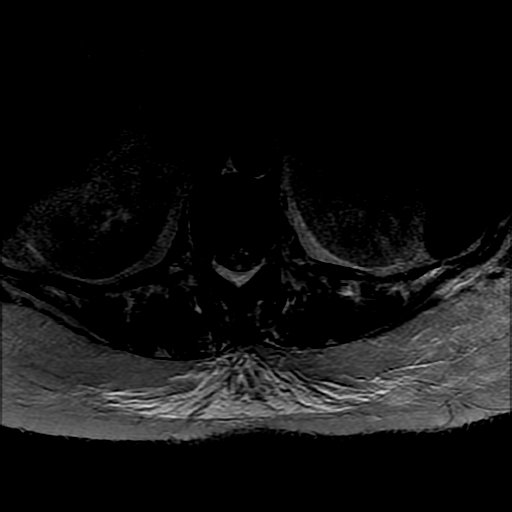
[im 6/42]
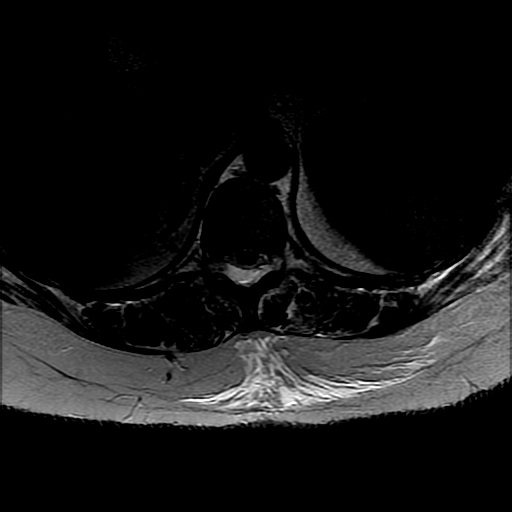
[im 11/42]
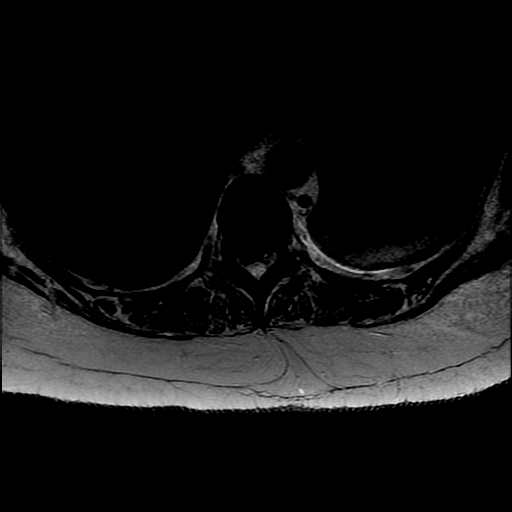
[im 16/42]
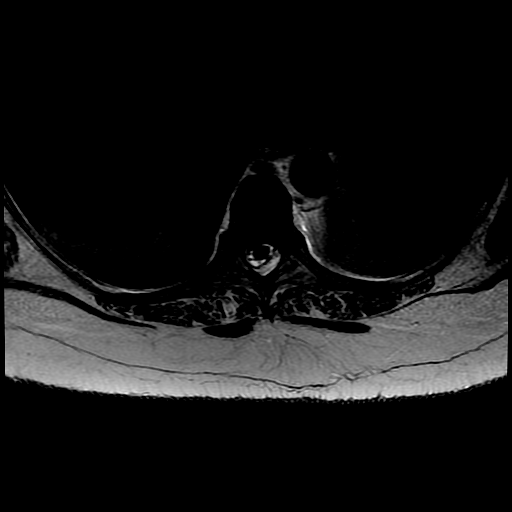
[im 21/42]
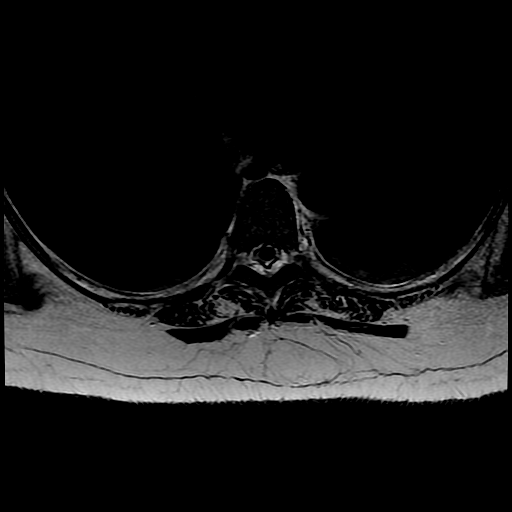
[im 26/42]
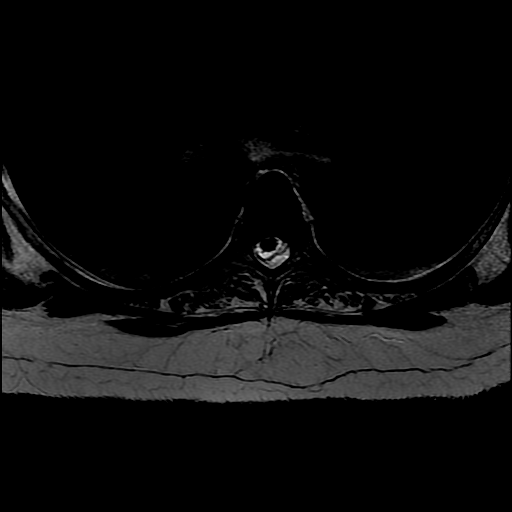
[im 31/42]
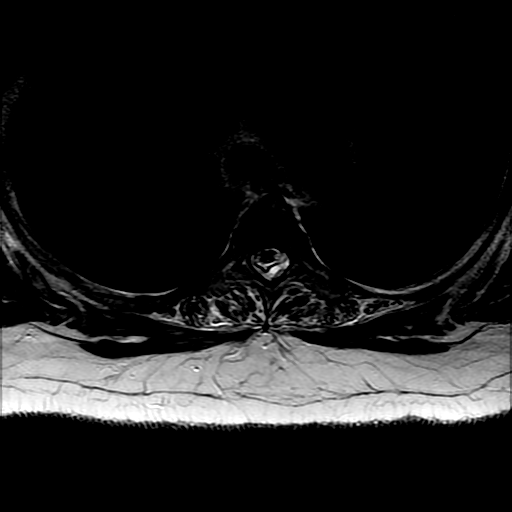
[im 36/42]
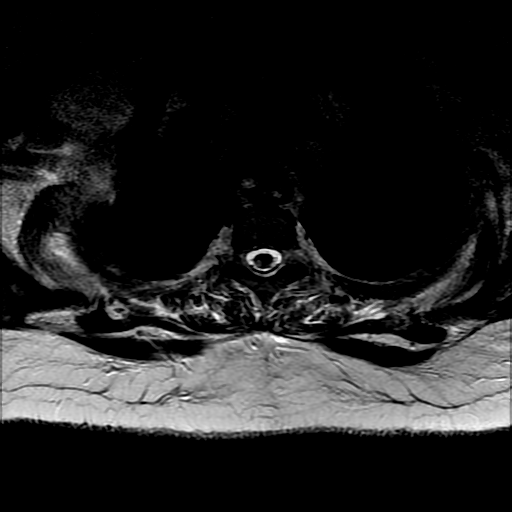
[im 42/42]
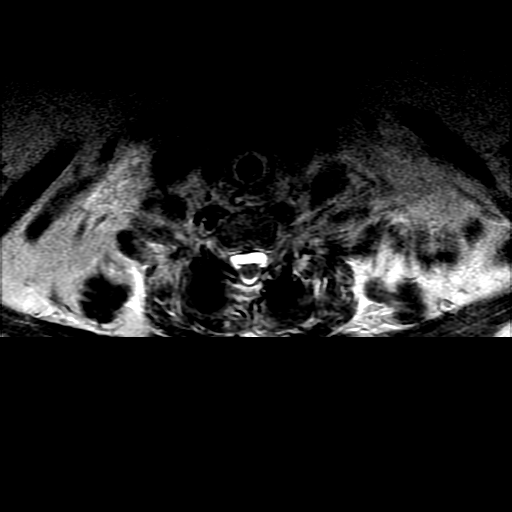

[Series 9: T1 · axial · non-contrast · 4.0mm · 0.39mm/px · z∈[-251,-52]mm · 5 of 42 slices shown (2 of 2)]
[im 1/42]
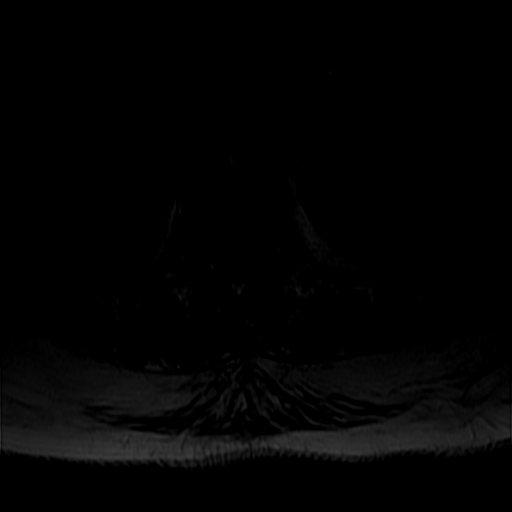
[im 6/42]
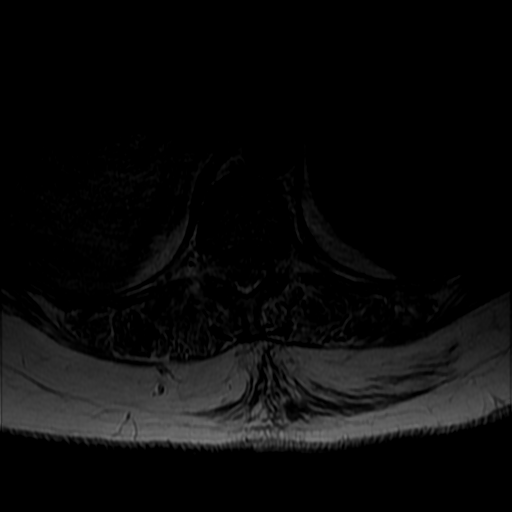
[im 11/42]
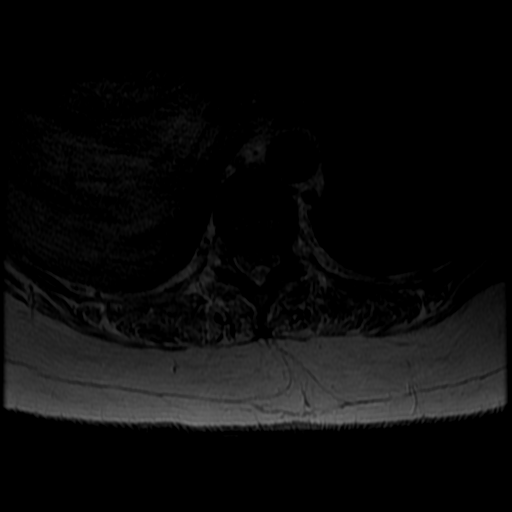
[im 21/42]
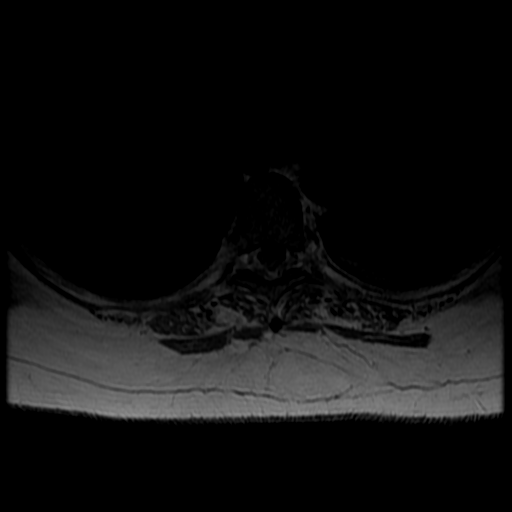
[im 36/42]
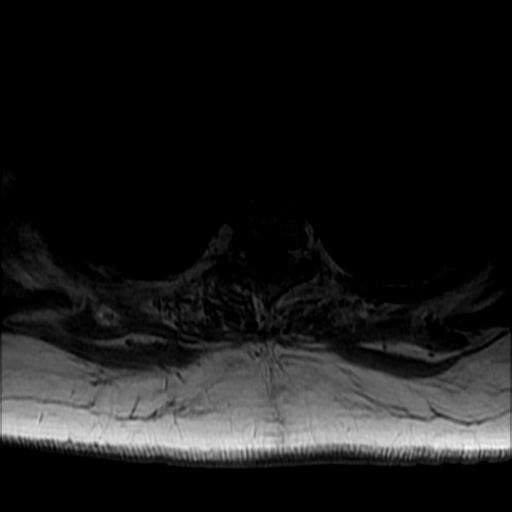

[19 of 48 positions shown; findings below may reference images not displayed]

FINDINGS: Alignment:  Normal

Vertebrae: Negative for fracture or mass. No evidence of discitis in
the thoracic spine

Cord: Mild cord hyperintensity bilaterally at T2-3. This is not seen
on the prior study but is a subtle finding. Otherwise normal spinal
cord signal.

Extensive posterior epidural abscess throughout the thoracic spine
is similar in size. This is predominately posteriorly located and
extends to the left of midline in the upper thoracic spine. Mild
spinal stenosis T7 through T12 due to abscess.

Paraspinal and other soft tissues: No abscess is seen outside of the
spinal canal. No significant pleural effusion.

2.2 cm spiculated enhancing mass right upper lobe, concerning for
carcinoma.

Disc levels:

Shallow central disc protrusion at T7-8. Small left-sided disc
protrusion T9-10. These are unchanged from the prior MRI.
IMPRESSION: Extensive thoracic epidural abscess is similar in size to the recent
MRI of [DATE]. There is mild spinal stenosis T7 through T12 due
to abscess, unchanged. No evidence of discitis osteomyelitis.

Mild cord hyperintensity at T2-3, not seen on the prior study
however this is a subtle finding. No significant spinal stenosis
level. Possible ischemic myelopathy.

2.2 cm spiculated enhancing mass right upper lobe concerning for
carcinoma. Recommend CT chest with contrast for further evaluation.

## 2022-02-27 MED ORDER — POTASSIUM CHLORIDE 20 MEQ PO PACK
40.0000 meq | PACK | Freq: Once | ORAL | Status: AC
Start: 2022-02-27 — End: 2022-02-27
  Administered 2022-02-27: 40 meq
  Filled 2022-02-27: qty 2

## 2022-02-27 MED ORDER — INSULIN ASPART 100 UNIT/ML IJ SOLN
6.0000 [IU] | INTRAMUSCULAR | Status: DC
  Administered 2022-02-27 – 2022-02-28 (×6): 6 [IU] via SUBCUTANEOUS

## 2022-02-27 MED ORDER — POTASSIUM PHOSPHATES 15 MMOLE/5ML IV SOLN
30.0000 mmol | Freq: Once | INTRAVENOUS | Status: AC
  Administered 2022-02-27: 30 mmol via INTRAVENOUS
  Filled 2022-02-27: qty 10

## 2022-02-27 MED ORDER — NOREPINEPHRINE 4 MG/250ML-% IV SOLN
0.0000 ug/min | INTRAVENOUS | Status: DC
  Administered 2022-02-27 – 2022-02-28 (×2): 2 ug/min via INTRAVENOUS
  Administered 2022-03-02 – 2022-03-03 (×2): 4 ug/min via INTRAVENOUS
  Administered 2022-03-03: 6 ug/min via INTRAVENOUS
  Administered 2022-03-04: 2 ug/min via INTRAVENOUS
  Filled 2022-02-27 (×7): qty 250

## 2022-02-27 MED ORDER — IBUPROFEN 100 MG/5ML PO SUSP
600.0000 mg | Freq: Four times a day (QID) | ORAL | Status: DC | PRN
  Administered 2022-02-27 – 2022-02-28 (×2): 600 mg
  Filled 2022-02-27 (×3): qty 30

## 2022-02-27 MED ORDER — FREE WATER
200.0000 mL | Status: DC
  Administered 2022-02-27 – 2022-03-02 (×15): 200 mL

## 2022-02-27 MED ORDER — FUROSEMIDE 10 MG/ML IJ SOLN
40.0000 mg | Freq: Once | INTRAMUSCULAR | Status: AC
  Administered 2022-02-27: 40 mg via INTRAVENOUS
  Filled 2022-02-27: qty 4

## 2022-02-27 MED ORDER — GADOBUTROL 1 MMOL/ML IV SOLN
10.0000 mL | Freq: Once | INTRAVENOUS | Status: AC | PRN
  Administered 2022-02-27: 10 mL via INTRAVENOUS

## 2022-02-27 NOTE — Progress Notes (Signed)
Spoke with MRI Tech, John, who states there is still no availability for this patient for MRI at this time.  ?

## 2022-02-27 NOTE — Progress Notes (Addendum)
eLink Physician-Brief Progress Note ?Patient Name: Gabrielle Vincent ?DOB: Jul 06, 1966 ?MRN: [id number] ? ? ?Date of Service ? 02/27/2022  ?HPI/Events of Note ? Notified of fever at 102.27F.  Pt with on and off fever.  Pt on vancomycin and ceftriaxone.  Most recent blood culture from 02/25/22 are negative to date.  K 3.4, phos 2.1, crea 1.05.   ?eICU Interventions ? Alternate ibuprofen with tylenol.  ?Kphos ordered.   ? ? ? ?Intervention Category ?Intermediate Interventions: Other: ? ?Elsie Lincoln ?02/27/2022, 5:34 AM ?

## 2022-02-27 NOTE — Progress Notes (Signed)
? ?NAME:  Cullen Vanallen, MRN:  [id number], DOB:  [birth year], LOS: 21 ?ADMISSION DATE:  02/14/2022, CONSULTATION DATE:  5/5 ?REFERRING MD:  Rai, CHIEF COMPLAINT:  acute encephalopathy and sepsis   ? ?History of Present Illness:  ?56 year old female who was admitted on 5/3 with chief complaint of severe low back pain radiating down her left leg.  Also had associated hematuria and dysuria.  She had no weakness although low did say pain impacted her ability to walk.  Also noted poor p.o. intake, decreased urination as well as urinary retention, hematuria, dysuria, and some nausea.  ? ?CT scan was negative for obstructive uropathy did show fatty liver infiltrates, there is cholelithiasis but no acute cholecystitis there is mild circumferential thickening of the distal esophagus global enlargement of the uterus with thickened appearance of the endometrial stripe with radiology recommending nonemergent pelvic ultrasound.  Diagnostic evaluation notable for new AKI with serum creatinine 3.64, acute transaminitis leukocytosis, and hyponatremia of 127 she was admitted for further evaluation, cultures were sent, and she was started on IV ceftriaxone. ? ?On 5/3 patient was noted to be more confused, speech was slurred , Working diagnosis was #1 urinary tract infection with resultant sepsis and also low back pain for which neurosurgery was consulted as MRI finding showed acute herniated L3-L4 disc with left lumbar radiculopathy.  It was felt that pain control via lumbar injection may help in that this could be treated conservatively. ? ?On 5/4 patient becoming intermittently agitated then lethargic, because of this a CT of head was obtained this was severely limited due to motion degraded meant but there was concern about hypodense area in the right basal ganglia section raising concern for acute infarct.  On 5/5 a rapid response was called the patient was severely agitated, pulled out IVs, pull out Foley catheter ? ?Later that  afternoon mental status continued to worsen.  Neurology consult was obtained in addition to this infectious disease was consulted with urine growing E. coli and narrowed antibiotics to cefazolin stopped vancomycin and recommended supportive care because her mental status continued to decline, she had severe metabolic derangements, and we were unable to provide medical care on the [place] ward she was transferred to the ICU for higher level of care.  On initial arrival she was agitated requiring several nurses to hold her down, tachypneic tachycardic would not follow commands had marked increased work of breathing.  An intraosseous line was placed in the right lower extremity, she was intubated for airway protection to facilitate further MRI imaging, and a right IJ triple-lumen catheter was placed ? ?Pertinent  Medical History  ?Class II obesity, hepatic steatosis, seasonal allergies, varicose veins diverticulosis, HL  ? ?Significant Hospital Events: ?Including procedures, antibiotic start and stop dates in addition to other pertinent events   ?5/3 admitted with back pain and urinary tract infection , Further complicated by AKI hyponatremia and multiple metabolic derangements.   ?CT imaging for renal stones showed no acute abdominal pelvic findings there was no obstructive uropathy there was severe fatty liver disease there was cholelithiasis but no cholecystitis there is colonic diverticulosis but no diverticulitis, mild circumferential thickening of the esophagus globular enlargement of the uterus with radiology recommending nonemergent pelvic ultrasound found to have uterine fibroid abdominal Ultrasound showed fatty liver but was epididymides negative.   ?MRI of the lumbar spine showed small left subarticular disc extrusion with inferior migration at L3-L4 correlating with radiculopathy.  She was started on ceftriaxone cultures were sent ?5/4 increased confusion CT  brain obtained raising concern for possible acute  infarct in the right basal ganglia ?5/5 progressive encephalopathy , Worsening leukocytosis, hypernatremia, hyperchloremia, slowly improving renal function, slowly improving procalcitonin, moved to ICU due to delirium, intubated for airway protection and to facilitate MRI imaging right IJ central line placed due to limited IV access.  Seen by neurology, also seen by infectious disease with ceftriaxone changed to cefazolin ?5/6 antibiotics changed to meningitis coverage given MRI findings of fluid in the ventricles.  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  DDAVP and volume resuscitation started ?5/11 hypernatremia slowly downtrending, continue DDAVP and IV resuscitation per nephrology ?5/12 Sodium downtrended to 147 this am ?5/13 to OR for posterior cervical decompressive laminectomy and medial facetectomy C3-T1 for evacuation of epirudal abscess, posterior cervical arthrodesis C3-C7, segmental fixation C3-7 ? ?Interim History / Subjective:  ?Fever to 102.1, started on Ibuprofen and Tylenol. ? ?Objective   ?Blood pressure (!) 103/46, pulse 79, temperature (!) 102 ?F (38.9 ?C), temperature source Axillary, resp. rate (!) 24, height '5\' 4"'$  (1.626 m), weight 103.3 kg, SpO2 98 %. ?   ?Vent Mode: PRVC ?FiO2 (%):  [30 %-40 %] 30 % ?Set Rate:  [24 bmp] 24 bmp ?Vt Set:  [440 mL] 440 mL ?PEEP:  [5 cmH20] 5 cmH20 ?Pressure Support:  [5 cmH20] 5 cmH20 ?Plateau Pressure:  [15 cmH20-18 cmH20] 18 cmH20  ? ?Intake/Output Summary (Last 24 hours) at 02/27/2022 0733 ?Last data filed at 02/27/2022 0600 ?Gross per 24 hour  ?Intake 3668.57 ml  ?Output 3225 ml  ?Net 443.57 ml  ? ? ?Filed Weights  ? 02/23/22 0500 02/23/22 2300 02/27/22 0453  ?Weight: 111.1 kg 102.2 kg 103.3 kg  ? ?Physical exam:  ?General: Adult female, resting  in bed, in NAD. ?Neuro: Sedated on vent, not following any commands, not moving LE's. ?HEENT: [place]/AT. Sclerae anicteric. ETT in place. ?Cardiovascular: RRR, no M/R/G.  ?Lungs: Respirations even and unlabored.  CTA bilaterally, No W/R/R. ?Abdomen: BS x 4, soft, NT/ND.  ?Musculoskeletal: No gross deformities, no edema.  ?Skin: Intact, warm, no rashes. ? ?Assessment & Plan:  ?Principal Problem: ?  Sepsis secondary to UTI [facility]) ?Active Problems: ?  Hyponatremia ?  Hypokalemia ?  AKI (acute kidney injury) ([place]) ?  Abnormal LFTs ?  Class II obesity ?  Hepatic steatosis ?  Cholelithiasis ?  Esophageal thickening ?  Acute low back pain ?  Encephalopathy acute ?  Acute respiratory failure ([place]) ?  Radiculopathy of lumbar region ?  E. coli UTI ?  Meningitis ?  Bacterial meningitis ?  Diabetes insipidus ([place]) ?  H/O excision of lamina of cervical vertebra for decompression of spinal cord ?  Abscess in epidural space of cervical spine ? ? ?Acute metabolic/septic encephalopathy  ?Acute bacterial meningitis with E. Coli due to direct extension from epidural abscess ?Severe sepsis secondary to E. coli UTI and E.coli meningitis ?Extensive epidural abscess extending from foramen magnum to the sacral spine s/p C3-T1 laminectomy and evacuation of abscess ?Possible quadriparesis - 2/2 above ?Continue supportive care  ?Continue abx (ceftriaxone, vancomycin) ?Neurosurgery and ID following, neurosurgery repeating MRI T spine ?F/u repeat blood cultures 5/14 ?CXR intermittently ? 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 ?Hypotension - 2/2 sepsis but also exacerbated by sedation. ?-  Lower sedation as able. ?- Might need low dose vasopressor support to facilitate gentle diuresis given volume overload. ? ?Acute hypoxic respiratory failure  ?Difficult airway ?Continue ventilator suppo

## 2022-02-27 NOTE — Progress Notes (Signed)
?  Echocardiogram ?2D Echocardiogram has been performed. ? ?Fidel Levy ?02/27/2022, 2:22 PM ?

## 2022-02-27 NOTE — Progress Notes (Signed)
Pt transported to MRI and back to 4N 24 on full vent support. No complications noted. ?

## 2022-02-27 NOTE — Progress Notes (Signed)
MRI completed, Dr. Ellene Route notified.  ?

## 2022-02-27 NOTE — Progress Notes (Signed)
PCCM Interval Note ? ?BP remains soft despite lowering sedation. ?Needs some lasix for fluid removal. ? ?Norepinephrine ordered to help facilitate diuresis and support BP hopefully temporarily. ? ? ?Montey Hora, PA - C ?[facility] ?For pager details, please see AMION or use Epic chat  ?After 1900, please call [facility] for cross coverage needs ?02/27/2022, 8:58 AM ? ?

## 2022-02-28 ENCOUNTER — Other Ambulatory Visit: Payer: Self-pay | Admitting: Neurological Surgery

## 2022-02-28 DIAGNOSIS — G061 Intraspinal abscess and granuloma: Secondary | ICD-10-CM | POA: Diagnosis not present

## 2022-02-28 DIAGNOSIS — N390 Urinary tract infection, site not specified: Secondary | ICD-10-CM | POA: Diagnosis not present

## 2022-02-28 DIAGNOSIS — Z9911 Dependence on respirator [ventilator] status: Secondary | ICD-10-CM | POA: Diagnosis not present

## 2022-02-28 DIAGNOSIS — A4151 Sepsis due to Escherichia coli [E. coli]: Secondary | ICD-10-CM

## 2022-02-28 DIAGNOSIS — J9601 Acute respiratory failure with hypoxia: Secondary | ICD-10-CM | POA: Diagnosis not present

## 2022-02-28 DIAGNOSIS — A419 Sepsis, unspecified organism: Secondary | ICD-10-CM | POA: Diagnosis not present

## 2022-02-28 DIAGNOSIS — R6521 Severe sepsis with septic shock: Secondary | ICD-10-CM

## 2022-02-28 LAB — BPAM RBC
Blood Product Expiration Date: 202306072359
Blood Product Expiration Date: 202306072359
Unit Type and Rh: 5100
Unit Type and Rh: 5100

## 2022-02-28 LAB — TYPE AND SCREEN
ABO/RH(D): O POS
Antibody Screen: NEGATIVE
Unit division: 0
Unit division: 0

## 2022-02-28 LAB — CBC
HCT: 29.2 % — ABNORMAL LOW (ref 36.0–46.0)
Hemoglobin: 9 g/dL — ABNORMAL LOW (ref 12.0–15.0)
MCH: 29.1 pg (ref 26.0–34.0)
MCHC: 30.8 g/dL (ref 30.0–36.0)
MCV: 94.5 fL (ref 80.0–100.0)
Platelets: 204 10*3/uL (ref 150–400)
RBC: 3.09 MIL/uL — ABNORMAL LOW (ref 3.87–5.11)
RDW: 17 % — ABNORMAL HIGH (ref 11.5–15.5)
WBC: 8.3 10*3/uL (ref 4.0–10.5)
nRBC: 0 % (ref 0.0–0.2)

## 2022-02-28 LAB — GLUCOSE, CAPILLARY
Glucose-Capillary: 145 mg/dL — ABNORMAL HIGH (ref 70–99)
Glucose-Capillary: 184 mg/dL — ABNORMAL HIGH (ref 70–99)
Glucose-Capillary: 192 mg/dL — ABNORMAL HIGH (ref 70–99)
Glucose-Capillary: 203 mg/dL — ABNORMAL HIGH (ref 70–99)
Glucose-Capillary: 214 mg/dL — ABNORMAL HIGH (ref 70–99)
Glucose-Capillary: 257 mg/dL — ABNORMAL HIGH (ref 70–99)

## 2022-02-28 LAB — BASIC METABOLIC PANEL
Anion gap: 10 (ref 5–15)
BUN: 26 mg/dL — ABNORMAL HIGH (ref 6–20)
CO2: 19 mmol/L — ABNORMAL LOW (ref 22–32)
Calcium: 7.3 mg/dL — ABNORMAL LOW (ref 8.9–10.3)
Chloride: 115 mmol/L — ABNORMAL HIGH (ref 98–111)
Creatinine, Ser: 1.11 mg/dL — ABNORMAL HIGH (ref 0.44–1.00)
GFR, Estimated: 58 mL/min — ABNORMAL LOW (ref >60)
Glucose, Bld: 245 mg/dL — ABNORMAL HIGH (ref 70–99)
Potassium: 3.5 mmol/L (ref 3.5–5.1)
Sodium: 144 mmol/L (ref 135–145)

## 2022-02-28 LAB — SODIUM
Sodium: 146 mmol/L — ABNORMAL HIGH (ref 135–145)
Sodium: 144 mmol/L (ref 135–145)
Sodium: 144 mmol/L (ref 135–145)
Sodium: 142 mmol/L (ref 135–145)
Sodium: 137 mmol/L (ref 135–145)

## 2022-02-28 LAB — PHOSPHORUS: Phosphorus: 2.9 mg/dL (ref 2.5–4.6)

## 2022-02-28 LAB — MAGNESIUM: Magnesium: 1.9 mg/dL (ref 1.7–2.4)

## 2022-02-28 MED ORDER — POTASSIUM CHLORIDE 20 MEQ PO PACK
40.0000 meq | PACK | Freq: Two times a day (BID) | ORAL | Status: AC
  Administered 2022-02-28 (×2): 40 meq
  Filled 2022-02-28 (×2): qty 2

## 2022-02-28 MED ORDER — POTASSIUM & SODIUM PHOSPHATES 280-160-250 MG PO PACK
2.0000 | PACK | ORAL | Status: AC
  Administered 2022-02-28 (×3): 2
  Filled 2022-02-28 (×2): qty 2
  Filled 2022-02-28: qty 1

## 2022-02-28 MED ORDER — INSULIN ASPART 100 UNIT/ML IJ SOLN
6.0000 [IU] | INTRAMUSCULAR | Status: DC
  Administered 2022-02-28: 6 [IU] via SUBCUTANEOUS

## 2022-02-28 MED ORDER — FUROSEMIDE 10 MG/ML IJ SOLN
40.0000 mg | Freq: Two times a day (BID) | INTRAMUSCULAR | Status: AC
  Administered 2022-02-28 (×2): 40 mg via INTRAVENOUS
  Filled 2022-02-28 (×2): qty 4

## 2022-02-28 MED ORDER — DESMOPRESSIN ACETATE 0.1 MG PO TABS
0.0500 mg | ORAL_TABLET | Freq: Four times a day (QID) | ORAL | Status: AC
  Administered 2022-02-28 – 2022-03-01 (×7): 0.05 mg via ORAL
  Filled 2022-02-28 (×8): qty 1

## 2022-02-28 MED ORDER — INSULIN ASPART 100 UNIT/ML IJ SOLN: 8.0000 [IU] | INTRAMUSCULAR | Status: DC

## 2022-02-28 MED ORDER — MAGNESIUM SULFATE 2 GM/50ML IV SOLN
2.0000 g | Freq: Once | INTRAVENOUS | Status: AC
  Administered 2022-02-28: 2 g via INTRAVENOUS
  Filled 2022-02-28: qty 50

## 2022-02-28 MED ORDER — INSULIN GLARGINE-YFGN 100 UNIT/ML ~~LOC~~ SOLN
10.0000 [IU] | Freq: Every day | SUBCUTANEOUS | Status: DC
  Administered 2022-02-28: 10 [IU] via SUBCUTANEOUS
  Filled 2022-02-28 (×2): qty 0.1

## 2022-02-28 MED ORDER — INSULIN ASPART 100 UNIT/ML IJ SOLN
8.0000 [IU] | INTRAMUSCULAR | Status: DC
  Administered 2022-02-28 – 2022-03-21 (×103): 8 [IU] via SUBCUTANEOUS

## 2022-02-28 MED ORDER — DESMOPRESSIN ACETATE 0.1 MG PO TABS
0.0500 mg | ORAL_TABLET | Freq: Two times a day (BID) | ORAL | Status: DC
  Administered 2022-03-02 – 2022-03-03 (×3): 0.05 mg via ORAL
  Filled 2022-02-28 (×3): qty 1

## 2022-02-28 NOTE — Plan of Care (Signed)
?  Problem: Education: ?Goal: Knowledge of General Education information will improve ?Description: Including pain rating scale, medication(s)/side effects and non-pharmacologic comfort measures ?Outcome: Progressing ?  ?Problem: Health Behavior/Discharge Planning: ?Goal: Ability to manage health-related needs will improve ?Outcome: Progressing ?  ?Problem: Clinical Measurements: ?Goal: Respiratory complications will improve ?Outcome: Progressing ?  ?Problem: Nutrition: ?Goal: Adequate nutrition will be maintained ?Outcome: Progressing ?  ?Problem: Coping: ?Goal: Level of anxiety will decrease ?Outcome: Progressing ?  ?Problem: Pain Managment: ?Goal: General experience of comfort will improve ?Outcome: Progressing ?  ?Problem: Skin Integrity: ?Goal: Risk for impaired skin integrity will decrease ?Outcome: Progressing ?  ?Problem: Urinary Elimination: ?Goal: Signs and symptoms of infection will decrease ?Outcome: Progressing ?  ?

## 2022-02-28 NOTE — Progress Notes (Incomplete Revision)
?   ?I have seen and examined the patient. I have personally reviewed the clinical findings, laboratory findings, microbiological data and imaging studies. The assessment and treatment plan was discussed with the Nurse Practitioner Janene Madeira. I agree with her/his recommendations except following additions/corrections. ? ? ? ?Rosiland Oz, MD ?Infectious Disease Physician ?[facility] ?[place] [address] ?[place], [place] [address] ?Phone: [phone]  Fax: [phone] ? ? ? ? ?[place] [facility] Infect[facility] ? ?Date of Admission:  02/14/2022    ? ? ?Total days of antibiotics 14 ?  ?        ? ?ASSESSMENT: ?Gabrielle Vincent is a 56 y.o. 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 She has had C-spine decompression C3-T1 on 5/13 and further plans noted to take her back for T-spine decompression tomorrow.  ? ?I think we can stop her Vancomycin soon. Fevers most likely due to the significant burden of known e coli infection, given she is still fevering through the vanc. BCx negative, preliminarily. 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 Further work up pending outcome of her current problem. Un-diagnosed cancer may explain why she has such an aggressive infection due to E coli.  ? ? ?PLAN: ?Continue vanc today  ?Continue ceftriaxone BID ?Follow operative findings  ? ?Principal Problem: ?  Abscess in epidural space of cervical spine ?Active Problems: ?  Bacterial meningitis ?  Sepsis secondary to UTI [facility]) ?  Hyponatremia ?  Hypokalemia ?  AKI (acute kidney injury) ([place]) ?  Abnormal LFTs ?  Class II obesity ?  Hepatic steatosis ?  Cholelithiasis ?  Esophageal thickening ?  Acute low back pain ?  Encephalopathy acute ?  Acute respiratory failure ([place]) ?  Radiculopathy of lumbar region ?  E. coli UTI ?  Meningitis ?  Diabetes insipidus ([place] ?  H/O excision of  lamina of cervical vertebra for decompression of spinal cord ? ? ? chlorhexidine gluconate (MEDLINE KIT)  15 mL Mouth Rinse BID  ? Chlorhexidine Gluconate Cloth  6 each Topical Daily  ? desmopressin  1 mcg Intravenous QID  ? desmopressin  0.05 mg Oral QID  ? Followed by  ? [START ON 03/02/2022] desmopressin  0.05 mg Oral BID  ? docusate  100 mg Per Tube BID  ? feeding supplement (PROSource TF)  45 mL Per Tube TID  ? free water  200 mL Per Tube Q4H  ? furosemide  40 mg Intravenous Q12H  ? heparin injection (subcutaneous)  5,000 Units Subcutaneous Q8H  ? insulin aspart  0-15 Units Subcutaneous Q4H  ? insulin aspart  6 Units Subcutaneous Q4H  ? insulin glargine-yfgn  10 Units Subcutaneous Daily  ? mouth rinse  15 mL Mouth Rinse 10 times per day  ? pantoprazole sodium  40 mg Per Tube Q1400  ? polyethylene glycol  17 g Per Tube Daily  ? potassium & sodium phosphates  2 packet Per Tube Q4H  ? potassium chloride  40 mEq Per Tube BID  ? sodium chloride flush  10-40 mL Intracatheter Q12H  ? sodium chloride flush  3 mL Intravenous Q12H  ? zinc oxide   Topical BID  ? ? ?SUBJECTIVE: ?Intubated.  ?More responsive today. Still with feeling in LE's --> going back to OR for further spine surgery.  ? ?Review of Systems: ?ROS - unable to assess d/t intubation  ? ? ?  Allergies  ?Allergen Reactions  ? Erythromycin Anaphylaxis  ? Penicillins Nausea Only  ? Prednisone Other (See Comments)  ?  Lymph node swelling  ? ? ?OBJECTIVE: ?Vitals:  ? 02/28/22 0800 02/28/22 0824 02/28/22 0900 02/28/22 1000  ?BP: (!) 120/52  (!) 109/55 (!) 98/54  ?Pulse: 78 74 72 73  ?Resp: (!) 30 (!) 23 (!) 24 (!) 24  ?Temp: 98.4 ?F (36.9 ?C)     ?TempSrc: Axillary     ?SpO2: 98% 99% 98% 98%  ?Weight:      ?Height:      ? ?Body mass index is 38.83 kg/m?. ? ?Physical Exam ?Constitutional:   ?   Appearance: She is ill-appearing.  ?HENT:  ?   Mouth/Throat:  ?   Comments: OETT in place  ?Cardiovascular:  ?   Rate and Rhythm: Normal rate.  ?Pulmonary:  ?   Effort:  Pulmonary effort is normal.  ?   Breath sounds: Normal breath sounds.  ?   Comments: Vent supported ?Neurological:  ?   Mental Status: She is alert.  ?   Sensory: No sensory deficit.  ?   Motor: Weakness present.  ? ? ?Lab Results ?Lab Results  ?Component Value Date  ? WBC 8.3 02/28/2022  ? HGB 9.0 (L) 02/28/2022  ? HCT 29.2 (L) 02/28/2022  ? MCV 94.5 02/28/2022  ? PLT 204 02/28/2022  ?  ?Lab Results  ?Component Value Date  ? CREATININE 1.11 (H) 02/28/2022  ? BUN 26 (H) 02/28/2022  ? NA 144 02/28/2022  ? K 3.5 02/28/2022  ? CL 115 (H) 02/28/2022  ? CO2 19 (L) 02/28/2022  ?  ?Lab Results  ?Component Value Date  ? ALT 22 02/22/2022  ? AST 19 02/22/2022  ? ALKPHOS 105 02/22/2022  ? BILITOT 0.4 02/22/2022  ?  ? ?Microbiology: ?Recent Results (from the past 240 hour(s))  ?CSF culture w Gram Stain     Status: None  ? Collection Time: 02/18/22  1:45 PM  ? Specimen: PATH Cytology CSF; Cerebrospinal Fluid  ?Result Value Ref Range Status  ? Specimen Description   Final  ?  CSF ?Performed at [facility], [place] [address], [place], [place] [address] ?  ? Special Requests   Final  ?  NONE ?Performed at [facility], [place] [address] [place], [place] [address] ?  ? Gram Stain   Final  ?  WBC PRESENT,BOTH PMN AND MONONUCLEAR ?CYTOSPIN SMEAR ?RESULT CALLED TO, READ BACK BY AND VERIFIED WITH: Tyson Babinski RN AT 1601 02/18/22 BY TIBBITTS,K ?CORRECTED RESULTS GRAM NEGATIVE RODS ?PREVIOUSLY REPORTED AS: GRAM POSITIVE COCCI ?CORRECTED RESULTS CALLED TO: PHARMD M.TUCKER AT 1133 ON 02/20/2022 BY T.SAAD. ?  ? Culture   Final  ?  MODERATE ESCHERICHIA COLI ?CRITICAL RESULT CALLED TO, READ BACK BY AND VERIFIED WITH: RN M.SPENS AT 1254 ON 02/19/2022 BY T.SAAD. ?Performed at [facility], [place] [address], [place], [place] [address] ?  ? Report Status 02/20/2022 FINAL  Final  ? Organism ID, Bacteria ESCHERICHIA COLI  Final  ?    Susceptibility  ? Escherichia coli - MIC*  ?  AMPICILLIN >=32 RESISTANT  Resistant   ?  CEFAZOLIN <=4 SENSITIVE Sensitive   ?  CEFEPIME <=0.12 SENSITIVE Sensitive   ?  CEFTAZIDIME <=1 SENSITIVE Sensitive   ?  CEFTRIAXONE <=0.25 SENSITIVE Sensitive   ?  CIPROFLOXACIN <=0.25 SENSITIVE Sensitive   ?  GENTAMICIN <=1 SENSITIVE Sensitive   ?  IMIPENEM <=0.25 SENSITIVE Sensitive   ?  TRIMETH/SULFA <=20 SENSITIVE Sensitive   ?  AMPICILLIN/SULBACTAM 16 INTERMEDIATE Intermediate   ?  PIP/TAZO <=4 SENSITIVE Sensitive   ?  * MODERATE ESCHERICHIA COLI  ?MRSA Next Gen by PCR, Nasal     Status: None  ? Collection Time: 02/24/22 12:12 AM  ? Specimen: Nasal Mucosa; Nasal Swab  ?Result Value Ref Range Status  ? MRSA by PCR Next Gen NOT DETECTED NOT DETECTED Final  ?  Comment: (NOTE) ?The GeneXpert MRSA Assay (FDA approved for NASAL specimens only), ?is one component of a comprehensive MRSA colonization surveillance ?program. It is not intended to diagnose MRSA infection nor to guide ?or monitor treatment for MRSA infections. ?Test performance is not FDA approved in patients less than 2 years ?old. ?Performed at [facility], [place] [address], [place], [place] ?[address] ?  ?Aerobic/Anaerobic Culture w Gram Stain (surgical/deep wound)     Status: None (Preliminary result)  ? Collection Time: 02/24/22 11:00 AM  ? Specimen: Abscess  ?Result Value Ref Range Status  ? Specimen Description ABSCESS  Final  ? Special Requests NONE  Final  ? Gram Stain   Final  ?  ABUNDANT WBC PRESENT, PREDOMINANTLY PMN ?NO ORGANISMS SEEN ?Performed at [facility], [place] [address] [place], [place] [address] ?  ? Culture   Final  ?  RARE ESCHERICHIA COLI ?NO ANAEROBES ISOLATED; CULTURE IN PROGRESS FOR 5 DAYS ?  ? Report Status PENDING  Incomplete  ? Organism ID, Bacteria ESCHERICHIA COLI  Final  ?    Susceptibility  ? Escherichia coli - MIC*  ?  AMPICILLIN >=32 RESISTANT Resistant   ?  CEFAZOLIN <=4 SENSITIVE Sensitive   ?  CEFEPIME <=0.12 SENSITIVE Sensitive   ?  CEFTAZIDIME <=1 SENSITIVE Sensitive   ?  CEFTRIAXONE  <=0.25 SENSITIVE Sensitive   ?  CIPROFLOXACIN <=0.25 SENSITIVE Sensitive   ?  GENTAMICIN <=1 SENSITIVE Sensitive   ?  IMIPENEM <=0.25 SENSITIVE Sensitive   ?  TRIMETH/SULFA <=20 SENSITIVE Sensitive   ?  AMPICILLIN/SULBACTAM

## 2022-02-28 NOTE — Progress Notes (Addendum)
I have seen and examined the patient. I have personally reviewed the clinical findings, laboratory findings, microbiological data and imaging studies. The assessment and treatment plan was discussed with the Nurse Practitioner Janene Madeira. I agree with her/his recommendations except following additions/corrections.  Continues to be febrile, no leukocytosis Follows some commands in UE, none in the LE MRI t spine findings noted, plan for OR by Neurosurgery Please send cultures as appropriate Blood cx 5/14 no growth in 4 days Trach cx 5/15 rare years, incubating  Continue ceftriaxone as is and Vancomycin empirically while cultures finalize and expect to stop if no need Monitor CBC, BMP and Vancomycin trough Following  Rosiland Oz, MD Infectious Disease Physician [facility] [address] [place], [place] [address] Phone: [phone]  Fax: [place] [facility] Infect[facility] Disease  Date of Admission:  02/14/2022      Total days of antibiotics 14            ASSESSMENT: Gabrielle Vincent is a 56 y.o. 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 She has had C-spine decompression C3-T1 on 5/13 and further plans noted to take her back for T-spine decompression tomorrow.   I think we can stop her Vancomycin soon. Fevers most likely due to the significant burden of known e coli infection, given she is still fevering through the vanc. BCx negative, preliminarily. 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 Further work up pending outcome of her current problem. Un-diagnosed cancer may explain why she has such an aggressive infection due to E coli.    PLAN: Continue vanc today  Continue ceftriaxone BID Follow operative findings   Principal Problem:   Abscess in epidural space of cervical spine Active Problems:    Bacterial meningitis   Sepsis secondary to UTI (HCC)   Hyponatremia   Hypokalemia   AKI (acute kidney injury) ([place])   Abnormal LFTs   Class II obesity   Hepatic steatosis   Cholelithiasis   Esophageal thickening   Acute low back pain   Encephalopathy acute   Acute respiratory failure (HCC)   Radiculopathy of lumbar region   E. coli UTI   Meningitis   Diabetes insipidus ([place])   H/O excision of lamina of cervical vertebra for decompression of spinal cord    chlorhexidine gluconate (MEDLINE KIT)  15 mL Mouth Rinse BID   Chlorhexidine Gluconate Cloth  6 each Topical Daily   desmopressin  1 mcg Intravenous QID   desmopressin  0.05 mg Oral QID   Followed by   Derrill Memo ON 03/02/2022] desmopressin  0.05 mg Oral BID   docusate  100 mg Per Tube BID   feeding supplement (PROSource TF)  45 mL Per Tube TID   free water  200 mL Per Tube Q4H   furosemide  40 mg Intravenous Q12H   heparin injection (subcutaneous)  5,000 Units Subcutaneous Q8H   insulin aspart  0-15 Units Subcutaneous Q4H   insulin aspart  6 Units Subcutaneous Q4H   insulin glargine-yfgn  10 Units Subcutaneous Daily   mouth rinse  15 mL Mouth Rinse 10 times per day   pantoprazole sodium  40 mg Per Tube Q1400   polyethylene glycol  17 g Per Tube Daily   potassium & sodium phosphates  2 packet Per Tube Q4H   potassium chloride  40 mEq Per  Tube BID   sodium chloride flush  10-40 mL Intracatheter Q12H   sodium chloride flush  3 mL Intravenous Q12H   zinc oxide   Topical BID    SUBJECTIVE: Intubated.  More responsive today. Still with feeling in LE's --> going back to OR for further spine surgery.   Review of Systems: ROS - unable to assess d/t intubation    Allergies  Allergen Reactions   Erythromycin Anaphylaxis   Penicillins Nausea Only   Prednisone Other (See Comments)    Lymph node swelling    OBJECTIVE: Vitals:   02/28/22 0800 02/28/22 0824 02/28/22 0900 02/28/22 1000  BP: (!) 120/52  (!) 109/55 (!) 98/54   Pulse: 78 74 72 73  Resp: (!) 30 (!) 23 (!) 24 (!) 24  Temp: 98.4 F (36.9 C)     TempSrc: Axillary     SpO2: 98% 99% 98% 98%  Weight:      Height:       Body mass index is 38.83 kg/m.  Physical Exam Constitutional:      Appearance: She is ill-appearing.  HENT:     Mouth/Throat:     Comments: OETT in place  Cardiovascular:     Rate and Rhythm: Normal rate.  Pulmonary:     Effort: Pulmonary effort is normal.     Breath sounds: Normal breath sounds.     Comments: Vent supported Neurological:     Mental Status: She is alert.     Sensory: No sensory deficit.     Motor: Weakness present.    Lab Results Lab Results  Component Value Date   WBC 8.3 02/28/2022   HGB 9.0 (L) 02/28/2022   HCT 29.2 (L) 02/28/2022   MCV 94.5 02/28/2022   PLT 204 02/28/2022    Lab Results  Component Value Date   CREATININE 1.11 (H) 02/28/2022   BUN 26 (H) 02/28/2022   NA 144 02/28/2022   K 3.5 02/28/2022   CL 115 (H) 02/28/2022   CO2 19 (L) 02/28/2022    Lab Results  Component Value Date   ALT 22 02/22/2022   AST 19 02/22/2022   ALKPHOS 105 02/22/2022   BILITOT 0.4 02/22/2022     Microbiology: Recent Results (from the past 240 hour(s))  CSF culture w Gram Stain     Status: None   Collection Time: 02/18/22  1:45 PM   Specimen: PATH Cytology CSF; Cerebrospinal Fluid  Result Value Ref Range Status   Specimen Description   Final    CSF Performed at [facility], [place] [address], [place], [place] [address]    Special Requests   Final    NONE Performed at [facility], [place] [address], [place], [place] [address]    Gram Stain   Final    WBC PRESENT,BOTH PMN AND MONONUCLEAR CYTOSPIN SMEAR RESULT CALLED TO, READ BACK BY AND VERIFIED WITH: Tyson Babinski RN AT 0354 02/18/22 BY TIBBITTS,K CORRECTED RESULTS GRAM NEGATIVE RODS PREVIOUSLY REPORTED AS: GRAM POSITIVE COCCI CORRECTED RESULTS CALLED TO: PHARMD M.TUCKER AT 1133 ON 02/20/2022 BY T.SAAD.     Culture   Final    MODERATE ESCHERICHIA COLI CRITICAL RESULT CALLED TO, READ BACK BY AND VERIFIED WITH: RN M.SPENS AT 1254 ON 02/19/2022 BY T.SAAD. Performed at [facility], [place] [address] [place], [place] [address]    Report Status 02/20/2022 FINAL  Final   Organism ID, Bacteria ESCHERICHIA COLI  Final      Susceptibility  Escherichia coli - MIC*    AMPICILLIN >=32 RESISTANT Resistant     CEFAZOLIN <=4 SENSITIVE Sensitive     CEFEPIME <=0.12 SENSITIVE Sensitive     CEFTAZIDIME <=1 SENSITIVE Sensitive     CEFTRIAXONE <=0.25 SENSITIVE Sensitive     CIPROFLOXACIN <=0.25 SENSITIVE Sensitive     GENTAMICIN <=1 SENSITIVE Sensitive     IMIPENEM <=0.25 SENSITIVE Sensitive     TRIMETH/SULFA <=20 SENSITIVE Sensitive     AMPICILLIN/SULBACTAM 16 INTERMEDIATE Intermediate     PIP/TAZO <=4 SENSITIVE Sensitive     * MODERATE ESCHERICHIA COLI  MRSA Next Gen by PCR, Nasal     Status: None   Collection Time: 02/24/22 12:12 AM   Specimen: Nasal Mucosa; Nasal Swab  Result Value Ref Range Status   MRSA by PCR Next Gen NOT DETECTED NOT DETECTED Final    Comment: (NOTE) The GeneXpert MRSA Assay (FDA approved for NASAL specimens only), is one component of a comprehensive MRSA colonization surveillance program. It is not intended to diagnose MRSA infection nor to guide or monitor treatment for MRSA infections. Test performance is not FDA approved in patients less than 35 years old. Performed at [facility], [place] [address], [place], [place] [address]   Aerobic/Anaerobic Culture w Gram Stain (surgical/deep wound)     Status: None (Preliminary result)   Collection Time: 02/24/22 11:00 AM   Specimen: Abscess  Result Value Ref Range Status   Specimen Description ABSCESS  Final   Special Requests NONE  Final   Gram Stain   Final    ABUNDANT WBC PRESENT, PREDOMINANTLY PMN NO ORGANISMS SEEN Performed at [facility], [place] [address] [place], [place] [address]    Culture   Final     RARE ESCHERICHIA COLI NO ANAEROBES ISOLATED; CULTURE IN PROGRESS FOR 5 DAYS    Report Status PENDING  Incomplete   Organism ID, Bacteria ESCHERICHIA COLI  Final      Susceptibility   Escherichia coli - MIC*    AMPICILLIN >=32 RESISTANT Resistant     CEFAZOLIN <=4 SENSITIVE Sensitive     CEFEPIME <=0.12 SENSITIVE Sensitive     CEFTAZIDIME <=1 SENSITIVE Sensitive     CEFTRIAXONE <=0.25 SENSITIVE Sensitive     CIPROFLOXACIN <=0.25 SENSITIVE Sensitive     GENTAMICIN <=1 SENSITIVE Sensitive     IMIPENEM <=0.25 SENSITIVE Sensitive     TRIMETH/SULFA <=20 SENSITIVE Sensitive     AMPICILLIN/SULBACTAM 16 INTERMEDIATE Intermediate     PIP/TAZO <=4 SENSITIVE Sensitive     * RARE ESCHERICHIA COLI  Culture, blood (Routine X 2) w Reflex to ID Panel     Status: None (Preliminary result)   Collection Time: 02/25/22 11:08 AM   Specimen: BLOOD  Result Value Ref Range Status   Specimen Description BLOOD BLOOD LEFT ARM  Final   Special Requests   Final    AEROBIC BOTTLE ONLY Blood Culture results may not be optimal due to an inadequate volume of blood received in culture bottles   Culture   Final    NO GROWTH 3 DAYS Performed at [facility], [place] [address] [place], [place] [address]    Report Status PENDING  Incomplete  Culture, blood (Routine X 2) w Reflex to ID Panel     Status: None (Preliminary result)   Collection Time: 02/25/22 11:08 AM   Specimen: BLOOD  Result Value Ref Range Status   Specimen Description BLOOD BLOOD RIGHT ARM  Final   Special Requests  Final    BOTTLES DRAWN AEROBIC ONLY Blood Culture results may not be optimal due to an inadequate volume of blood received in culture bottles   Culture   Final    NO GROWTH 3 DAYS Performed at [facility], [place] [address], [place], [place] [address]    Report Status PENDING  Incomplete  Culture, Respiratory w Gram Stain     Status: None (Preliminary result)   Collection Time: 02/26/22 11:44 AM   Specimen: Tracheal  Aspirate; Respiratory  Result Value Ref Range Status   Specimen Description TRACHEAL ASPIRATE  Final   Special Requests NONE  Final   Gram Stain   Final    MODERATE WBC PRESENT,BOTH PMN AND MONONUCLEAR NO ORGANISMS SEEN    Culture   Final    RARE YEAST CULTURE REINCUBATED FOR BETTER GROWTH Performed at [facility], [place] [address], [place], [place] [address]    Report Status PENDING  Incomplete   Imaging MR THORACIC SPINE W WO CONTRAST  Result Date: 02/27/2022 CLINICAL DATA:  Acute myelopathy. Follow-up spinal epidural abscess. Status post cervical decompression. EXAM: MRI THORACIC WITHOUT AND WITH CONTRAST TECHNIQUE: Multiplanar and multiecho pulse sequences of the thoracic spine were obtained without and with intravenous contrast. CONTRAST:  64m GADAVIST GADOBUTROL 1 MMOL/ML IV SOLN COMPARISON:  MRI thoracic spine 02/23/2022 FINDINGS: Alignment:  Normal Vertebrae: Negative for fracture or mass. No evidence of discitis in the thoracic spine Cord: Mild cord hyperintensity bilaterally at T2-3. This is not seen on the prior study but is a subtle finding. Otherwise normal spinal cord signal. Extensive posterior epidural abscess throughout the thoracic spine is similar in size. This is predominately posteriorly located and extends to the left of midline in the upper thoracic spine. Mild spinal stenosis T7 through T12 due to abscess. Paraspinal and other soft tissues: No abscess is seen outside of the spinal canal. No significant pleural effusion. 2.2 cm spiculated enhancing mass right upper lobe, concerning for carcinoma. Disc levels: Shallow central disc protrusion at T7-8. Small left-sided disc protrusion T9-10. These are unchanged from the prior MRI. IMPRESSION: Extensive thoracic epidural abscess is similar in size to the recent MRI of 02/23/2022. There is mild spinal stenosis T7 through T12 due to abscess, unchanged. No evidence of discitis osteomyelitis. Mild cord hyperintensity at T2-3,  not seen on the prior study however this is a subtle finding. No significant spinal stenosis level. 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 2.2 cm spiculated enhancing mass right upper lobe concerning for carcinoma. Recommend CT chest with contrast for further evaluation. Electronically Signed   By: CFranchot GalloM.D.   On: 02/27/2022 17:36   ECHOCARDIOGRAM COMPLETE  Result Date: 02/27/2022    ECHOCARDIOGRAM REPORT   Patient Name:   Gabrielle DETZELDate of Exam: 02/27/2022 Medical Rec #:  [id number]     Height:       64.0 in Accession #:    [id number]    Weight:       227.7 lb Date of Birth:  3Aug 15, 1967     BSA:          2.067 m Patient Age:    89years       BP:           118/50 mmHg Patient Gender: F              HR:           85 bpm. Exam Location:  Inpatient Procedure: 2D Echo, Cardiac  Doppler and Color Doppler Indications:    Fever R50.9  History:        Patient has no prior history of Echocardiogram examinations.                 Risk Factors:Dyslipidemia.  Sonographer:    Bernadene Person RDCS Referring Phys: [id number] [facility]  Sonographer Comments: Patient is morbidly obese. IMPRESSIONS  1. Left ventricular ejection fraction, by estimation, is 60 to 65%. The left ventricle has normal function. The left ventricle has no regional wall motion abnormalities. Left ventricular diastolic parameters were normal.  2. Right ventricular systolic function is normal. The right ventricular size is normal.  3. Left atrial size was mildly dilated.  4. The mitral valve is normal in structure. Mild mitral valve regurgitation. No evidence of mitral stenosis.  5. The aortic valve is normal in structure. Aortic valve regurgitation is not visualized. No aortic stenosis is present.  6. The inferior vena cava is normal in size with greater than 50% respiratory variability, suggesting right atrial pressure of 3 mmHg. Conclusion(s)/Recommendation(s): No evidence of valvular vegetations on this transthoracic echocardiogram. Consider a  transesophageal echocardiogram to exclude infective endocarditis if clinically indicated. FINDINGS  Left Ventricle: Left ventricular ejection fraction, by estimation, is 60 to 65%. The left ventricle has normal function. The left ventricle has no regional wall motion abnormalities. The left ventricular internal cavity size was normal in size. There is  no left ventricular hypertrophy. Left ventricular diastolic parameters were normal. Right Ventricle: The right ventricular size is normal. No increase in right ventricular wall thickness. Right ventricular systolic function is normal. Left Atrium: Left atrial size was mildly dilated. Right Atrium: Right atrial size was normal in size. Pericardium: There is no evidence of pericardial effusion. Mitral Valve: The mitral valve is normal in structure. Mild mitral valve regurgitation. No evidence of mitral valve stenosis. Tricuspid Valve: The tricuspid valve is normal in structure. Tricuspid valve regurgitation is not demonstrated. No evidence of tricuspid stenosis. Aortic Valve: The aortic valve is normal in structure. Aortic valve regurgitation is not visualized. No aortic stenosis is present. Pulmonic Valve: The pulmonic valve was normal in structure. Pulmonic valve regurgitation is not visualized. No evidence of pulmonic stenosis. Aorta: The aortic root is normal in size and structure. Venous: The inferior vena cava is normal in size with greater than 50% respiratory variability, suggesting right atrial pressure of 3 mmHg. IAS/Shunts: No atrial level shunt detected by color flow Doppler.  LEFT VENTRICLE PLAX 2D LVIDd:         4.40 cm      Diastology LVIDs:         2.80 cm      LV e' medial:    8.84 cm/s LV PW:         1.10 cm      LV E/e' medial:  13.7 LV IVS:        0.70 cm      LV e' lateral:   11.50 cm/s LVOT diam:     2.00 cm      LV E/e' lateral: 10.5 LV SV:         75 LV SV Index:   36 LVOT Area:     3.14 cm  LV Volumes (MOD) LV vol d, MOD A2C: 106.0 ml LV vol d,  MOD A4C: 113.0 ml LV vol s, MOD A2C: 50.3 ml LV vol s, MOD A4C: 53.2 ml LV SV MOD A2C:     55.7 ml LV  SV MOD A4C:     113.0 ml LV SV MOD BP:      57.1 ml RIGHT VENTRICLE RV S prime:     14.10 cm/s TAPSE (M-mode): 2.3 cm LEFT ATRIUM             Index        RIGHT ATRIUM          Index LA diam:        3.90 cm 1.89 cm/m   RA Area:     9.57 cm LA Vol (A2C):   59.4 ml 28.73 ml/m  RA Volume:   17.20 ml 8.32 ml/m LA Vol (A4C):   53.4 ml 25.83 ml/m LA Biplane Vol: 56.4 ml 27.28 ml/m  AORTIC VALVE LVOT Vmax:   131.00 cm/s LVOT Vmean:  89.300 cm/s LVOT VTI:    0.238 m  AORTA Ao Root diam: 3.00 cm Ao Asc diam:  3.00 cm MITRAL VALVE MV Area (PHT): 3.85 cm     SHUNTS MV Decel Time: 197 msec     Systemic VTI:  0.24 m MV E velocity: 121.00 cm/s  Systemic Diam: 2.00 cm MV A velocity: 62.40 cm/s MV E/A ratio:  1.94 Mihai Croitoru MD Electronically signed by Sanda Klein MD Signature Date/Time: 02/27/2022/3:56:50 PM    Final      Janene Madeira, MSN, NP-C [facility] [name] Pager: [phone] Office: [phone] [facility] Main Line: [place] [facility] Welcome

## 2022-02-28 NOTE — Progress Notes (Signed)
? ?NAME:  Gabrielle Vincent, MRN:  [id number], DOB:  1966/09/29, LOS: 82 ?ADMISSION DATE:  02/14/2022, CONSULTATION DATE:  5/5 ?REFERRING MD:  Rai, CHIEF COMPLAINT:  acute encephalopathy and sepsis   ? ?History of Present Illness:  ?56 year old female who was admitted on 5/3 with chief complaint of severe low back pain radiating down her left leg.  Also had associated hematuria and dysuria.  She had no weakness although low did say pain impacted her ability to walk.  Also noted poor p.o. intake, decreased urination as well as urinary retention, hematuria, dysuria, and some nausea.  ? ?CT scan was negative for obstructive uropathy did show fatty liver infiltrates, there is cholelithiasis but no acute cholecystitis there is mild circumferential thickening of the distal esophagus global enlargement of the uterus with thickened appearance of the endometrial stripe with radiology recommending nonemergent pelvic ultrasound.  Diagnostic evaluation notable for new AKI with serum creatinine 3.64, acute transaminitis leukocytosis, and hyponatremia of 127 she was admitted for further evaluation, cultures were sent, and she was started on IV ceftriaxone. ? ?On 5/3 patient was noted to be more confused, speech was slurred , Working diagnosis was #1 urinary tract infection with resultant sepsis and also low back pain for which neurosurgery was consulted as MRI finding showed acute herniated L3-L4 disc with left lumbar radiculopathy.  It was felt that pain control via lumbar injection may help in that this could be treated conservatively. ? ?On 5/4 patient becoming intermittently agitated then lethargic, because of this a CT of head was obtained this was severely limited due to motion degraded meant but there was concern about hypodense area in the right basal ganglia section raising concern for acute infarct.  On 5/5 a rapid response was called the patient was severely agitated, pulled out IVs, pull out Foley catheter ? ?Later that  afternoon mental status continued to worsen.  Neurology consult was obtained in addition to this infectious disease was consulted with urine growing E. coli and narrowed antibiotics to cefazolin stopped vancomycin and recommended supportive care because her mental status continued to decline, she had severe metabolic derangements, and we were unable to provide medical care on the [place] ward she was transferred to the ICU for higher level of care.  On initial arrival she was agitated requiring several nurses to hold her down, tachypneic tachycardic would not follow commands had marked increased work of breathing.  An intraosseous line was placed in the right lower extremity, she was intubated for airway protection to facilitate further MRI imaging, and a right IJ triple-lumen catheter was placed ? ?Pertinent  Medical History  ?Class II obesity, hepatic steatosis, seasonal allergies, varicose veins diverticulosis, HL  ? ?Significant Hospital Events: ?Including procedures, antibiotic start and stop dates in addition to other pertinent events   ?5/3 admitted with back pain and urinary tract infection , Further complicated by AKI hyponatremia and multiple metabolic derangements.   ?CT imaging for renal stones showed no acute abdominal pelvic findings there was no obstructive uropathy there was severe fatty liver disease there was cholelithiasis but no cholecystitis there is colonic diverticulosis but no diverticulitis, mild circumferential thickening of the esophagus globular enlargement of the uterus with radiology recommending nonemergent pelvic ultrasound found to have uterine fibroid abdominal Ultrasound showed fatty liver but was epididymides negative.   ?MRI of the lumbar spine showed small left subarticular disc extrusion with inferior migration at L3-L4 correlating with radiculopathy.  She was started on ceftriaxone cultures were sent ?5/4 increased confusion CT  brain obtained raising concern for possible acute  infarct in the right basal ganglia ?5/5 progressive encephalopathy , Worsening leukocytosis, hypernatremia, hyperchloremia, slowly improving renal function, slowly improving procalcitonin, moved to ICU due to delirium, intubated for airway protection and to facilitate MRI imaging right IJ central line placed due to limited IV access.  Seen by neurology, also seen by infectious disease with ceftriaxone changed to cefazolin ?5/6 antibiotics changed to meningitis coverage given MRI findings of fluid in the ventricles.  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  DDAVP and volume resuscitation started ?5/11 hypernatremia slowly downtrending, continue DDAVP and IV resuscitation per nephrology ?5/12 Sodium downtrended to 147 this am ?5/13 to OR for posterior cervical decompressive laminectomy and medial facetectomy C3-T1 for evacuation of epirudal abscess, posterior cervical arthrodesis C3-C7, segmental fixation C3-7 ? ?Interim History / Subjective:  ?NAEON. ?MRI T spine with abscess similar in size to prior MRI, mild spinal stenosis 2/2 abscess. No evidence of discitis or osteomyelitis. Mild cord hyperintensity at T2-3 with possible ischemic myelopathy. 2.2 cm spiculated enhancing mass RUL concerning for carcinoma. ? ?Objective   ?Blood pressure (!) 146/66, pulse 71, temperature (!) 100.6 ?F (38.1 ?C), temperature source Axillary, resp. rate (!) 28, height '5\' 4"'$  (1.626 m), weight 102.6 kg, SpO2 98 %. ?   ?Vent Mode: PRVC ?FiO2 (%):  [30 %] 30 % ?Set Rate:  [24 bmp] 24 bmp ?Vt Set:  [440 mL] 440 mL ?PEEP:  [5 cmH20] 5 cmH20 ?Pressure Support:  [10 cmH20] 10 cmH20 ?Plateau Pressure:  [15 cmH20-16 cmH20] 16 cmH20  ? ?Intake/Output Summary (Last 24 hours) at 02/28/2022 0714 ?Last data filed at 02/28/2022 0600 ?Gross  per 24 hour  ?Intake 4521.57 ml  ?Output 4379 ml  ?Net 142.57 ml  ? ? ?Filed Weights  ? 02/23/22 2300 02/27/22 0453 02/28/22 0315  ?Weight: 102.2 kg 103.3 kg 102.6 kg  ? ?Physical exam: ?General: Adult female, resting in bed, in NAD. ?Neuro: Awake on vent, follows basic commands with UE's though very weak. Not moving LE's. ?HEENT: [place]/AT. Sclerae anicteric. ETT in place. ?Cardiovascular: RRR, no M/R/G.  ?Lungs: Respirations even and unlabored.  CTA bilaterally, No W/R/R. ?Abdomen: BS x 4, soft, NT/ND.  ?Musculoskeletal: No gross deformities, 1+ edema.  ?Skin: Intact, warm, no rashes. ?GU: +4.75L UOP. Net +7.4L since admit. ? ?Assessment & Plan:  ?Principal Problem: ?  Sepsis secondary to UTI [facility]) ?Active Problems: ?  Hyponatremia ?  Hypokalemia ?  AKI (acute kidney injury) ([place]) ?  Abnormal LFTs ?  Class II obesity ?  Hepatic steatosis ?  Cholelithiasis ?  Esophageal thickening ?  Acute low back pain ?  Encephalopathy acute ?  Acute respiratory failure ([place]) ?  Radiculopathy of lumbar region ?  E. coli UTI ?  Meningitis ?  Bacterial meningitis ?  Diabetes insipidus ([place]) ?  H/O excision of lamina of cervical vertebra for decompression of spinal cord ?  Abscess in epidural space of cervical spine ? ? ?Acute metabolic/septic encephalopathy  ?Acute bacterial meningitis with E. Coli due to direct extension from epidural abscess ?Severe sepsis secondary to E. coli UTI and E.coli meningitis ?Extensive epidural abscess extending from foramen magnum to the sacral spine s/p C3-T1 laminectomy and evacuation of abscess ?Possible quadriparesis - 2/2 above ?Continue supportive care  ?Continue abx (  ceftriaxone, vancomycin) ?Neurosurgery and ID following ?F/u repeat blood cultures 5/14 ?CXR intermittently ? 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 ?Hypotension - 2/2 sepsis but also exacerbated by sedation. ?Lower sedation as able. ?Low dose Levophed as needed to facilitate diuresis. ? ?Acute hypoxic respiratory

## 2022-02-28 NOTE — Progress Notes (Signed)
? ?[facility] Infectious Diseases Follow Up Note ? ?Patient Identification: ?Patient Name: Gabrielle Vincent MRN: [id number] [place] Date: 02/14/2022  9:53 AM ?Age: 56 y.o.Today's Date: 02/28/2022 ? ?Reason for Visit: E. coli meningitis and epidural abscess ? ?Principal Problem: ?  Sepsis secondary to UTI [facility]) ?Active Problems: ?  Hyponatremia ?  Hypokalemia ?  AKI (acute kidney injury) ([place]) ?  Abnormal LFTs ?  Class II obesity ?  Hepatic steatosis ?  Cholelithiasis ?  Esophageal thickening ?  Acute low back pain ?  Encephalopathy acute ?  Acute respiratory failure ([place]) ?  Radiculopathy of lumbar region ?  E. coli UTI ?  Meningitis ?  Bacterial meningitis ?  Diabetes insipidus ([place]) ?  H/O excision of lamina of cervical vertebra for decompression of spinal cord ?  Abscess in epidural space of cervical spine ? ? ?Antibiotics:  ?Ceftriaxone 5/3-5/5, cefepime 5/6-5/9, ceftriaxone 5/9-c ?Vancomycin 5/5-5/8, 5/14  ?Ampicillin 5/6-5/8 ?Acyclovir 5/6-5/8 ? ?Lines/Hardwares: Right IJ CVC ? ?Interval Events: Tmax 101.4 in the last 24 hours, WBC downtrending ? ? ?Assessment ?# E. coli UTI complicated with epidural abscess throughout T L with possible extension to C spine and meningitis/associated severe diffuse spinal stenosis ? ?# L4-L5 and L5-s1 septic arthritis ?- 5/7 CSF Cx with E coli  ?-5/13 for posterior cervical decompression, laminectomy and medial facetectomy C3-T1 with instrumented fusion from C3-C7. OR cx with E coli  ? ?#Acute hypoxemic respiratory failure: Status post mechanical ventilation for airway protection ?#Newly diagnosed DM: a1c 8.9 ?# Fevers ?# AKI: Cr appears stable/improving ? ?Recommendations ?Ceftriaxone 2 g IV every 12 ?Continue vancomycin, pharmacy to dose empirically pending blood cultures  ?Fu pending cultures  ?Monitor fever curve and WBC. Concerned  if ongoing fevers related to source control issue in the spine.  ?Fu MRI T spine and TTE   ?Following ? ?Rest of the management as per the primary team. ?Thank you for the consult. Please page with pertinent questions or concerns. ? ?______________________________________________________________________ ?Subjective ?patient seen and examined at the bedside.  ?Brother at bedside  ?Planned for MRI ? ?Vitals ?BP 137/64   Pulse 74   Temp (!) 100.6 ?F (38.1 ?C) (Axillary)   Resp (!) 28   Ht '5\' 4"'$  (1.626 m)   Wt 102.6 kg   LMP  (LMP Unknown)   SpO2 97%   BMI 38.83 kg/m?  ? ?  ?Physical Exam ?Constitutional: Orotracheally intubated, on Precedex and fentanyl ?   Comments:  ? ?Cardiovascular:  ?   Rate and Rhythm: Normal rate and regular rhythm.  ?   Heart sounds:  ? ?Pulmonary:  ?   Effort: Pulmonary effort is normal on mechanical ventilation ?   Comments:  ? ?Abdominal:  ?   Palpations: Abdomen is soft.  ?   Tenderness: Nondistended ? ?Musculoskeletal:     ?   General: No swelling or erythema noted in peripheral joints, bilateral pedal edema  ? ?Skin: ?   Comments: No obvious rashes. RT arm PICC+ ? ?Neurological:  ?   General: Unable to assess, did not follow commands for me but resisted opening eyes. Moves UE occasionally per RN ? ?Pertinent Microbiology ?Results for orders placed or performed during the hospital encounter of 02/14/22  ?Urine Culture     Status: Abnormal  ? Collection Time: 02/14/22 10:40 AM  ? Specimen: Urine, Clean Catch  ?Result Value Ref Range Status  ? Specimen Description   Final  ?  URINE, CLEAN CATCH ?Performed at [facility], [phone]  [address], [place], [place] [address] ?  ? Special Requests   Final  ?  NONE ?Performed at [facility], [address] [place], [place] [address] ?  ? Culture >=100,000 COLONIES/mL ESCHERICHIA COLI (A)  Final  ? Report Status 02/16/2022 FINAL  Final  ? Organism ID, Bacteria ESCHERICHIA COLI (A)  Final  ?    Susceptibility  ? Escherichia coli - MIC*  ?  AMPICILLIN >=32 RESISTANT Resistant   ?  CEFAZOLIN <=4  SENSITIVE Sensitive   ?  CEFEPIME <=0.12 SENSITIVE Sensitive   ?  CEFTRIAXONE <=0.25 SENSITIVE Sensitive   ?  CIPROFLOXACIN <=0.25 SENSITIVE Sensitive   ?  GENTAMICIN <=1 SENSITIVE Sensitive   ?  IMIPENEM <=0.25 SENSITIVE Sensitive   ?  NITROFURANTOIN <=16 SENSITIVE Sensitive   ?  TRIMETH/SULFA <=20 SENSITIVE Sensitive   ?  AMPICILLIN/SULBACTAM >=32 RESISTANT Resistant   ?  PIP/TAZO <=4 SENSITIVE Sensitive   ?  * >=100,000 COLONIES/mL ESCHERICHIA COLI  ?Culture, blood (Routine X 2) w Reflex to ID Panel     Status: None  ? Collection Time: 02/15/22 11:11 AM  ? Specimen: BLOOD  ?Result Value Ref Range Status  ? Specimen Description   Final  ?  BLOOD BLOOD RIGHT HAND ?Performed at [facility], [place] [address], [place], [place] [address] ?  ? Special Requests   Final  ?  BOTTLES DRAWN AEROBIC AND ANAEROBIC Blood Culture adequate volume ?Performed at [facility], [place] [address], [place], [place] [address] ?  ? Culture   Final  ?  NO GROWTH 5 DAYS ?Performed at [facility], [place] [address] [place], [place] [address] ?  ? Report Status 02/20/2022 FINAL  Final  ?Culture, blood (Routine X 2) w Reflex to ID Panel     Status: None  ? Collection Time: 02/15/22 11:21 AM  ? Specimen: BLOOD  ?Result Value Ref Range Status  ? Specimen Description   Final  ?  BLOOD BLOOD LEFT HAND ?Performed at [facility], [place] [address], [place], [place] [address] ?  ? Special Requests   Final  ?  BOTTLES DRAWN AEROBIC AND ANAEROBIC Blood Culture adequate volume ?Performed at [facility], [place] [address], [place], [place] [address] ?  ? Culture   Final  ?  NO GROWTH 5 DAYS ?Performed at [facility], [place] [address] [place], [place] [address] ?  ? Report Status 02/20/2022 FINAL  Final  ?MRSA Next Gen by PCR, Nasal     Status: None  ? Collection Time: 02/16/22  3:06 PM  ? Specimen: Nasal Mucosa; Nasal Swab  ?Result Value Ref Range Status  ? MRSA by PCR Next  Gen NOT DETECTED NOT DETECTED Final  ?  Comment: (NOTE) ?The GeneXpert MRSA Assay (FDA approved for NASAL specimens only), ?is one component of a comprehensive MRSA colonization surveillance ?program. It is not intended to diagnose MRSA infection nor to guide ?or monitor treatment for MRSA infections. ?Test performance is not FDA approved in patients less than 2 years ?old. ?Performed at [facility], [place] [address] ?[place], [place] [address] ?  ?CSF culture w Gram Stain     Status: None  ? Collection Time: 02/18/22  1:45 PM  ? Specimen: PATH Cytology CSF; Cerebrospinal Fluid  ?Result Value Ref Range Status  ? Specimen Description   Final  ?  CSF ?Performed at [facility], [place] [address] [place], [place]  [address] ?  ? Special Requests   Final  ?  NONE ?Performed at [facility], [place] [address] [place], [place] [address] ?  ? Gram Stain   Final  ?  WBC PRESENT,BOTH PMN AND MONONUCLEAR ?CYTOSPIN SMEAR ?RESULT CALLED TO, READ BACK BY AND VERIFIED WITH: Tyson Babinski RN AT 4970 02/18/22 BY TIBBITTS,K ?CORRECTED RESULTS GRAM NEGATIVE RODS ?PREVIOUSLY REPORTED AS: GRAM POSITIVE COCCI ?CORRECTED RESULTS CALLED TO: PHARMD M.TUCKER AT 1133 ON 02/20/2022 BY T.SAAD. ?  ? Culture   Final  ?  MODERATE ESCHERICHIA COLI ?CRITICAL RESULT CALLED TO, READ BACK BY AND VERIFIED WITH: RN M.SPENS AT 1254 ON 02/19/2022 BY T.SAAD. ?Performed at [facility], [place] [address], [place], [place] [address] ?  ? Report Status 02/20/2022 FINAL  Final  ? Organism ID, Bacteria ESCHERICHIA COLI  Final  ?    Susceptibility  ? Escherichia coli - MIC*  ?  AMPICILLIN >=32 RESISTANT Resistant   ?  CEFAZOLIN <=4 SENSITIVE Sensitive   ?  CEFEPIME <=0.12 SENSITIVE Sensitive   ?  CEFTAZIDIME <=1 SENSITIVE Sensitive   ?  CEFTRIAXONE <=0.25 SENSITIVE Sensitive   ?  CIPROFLOXACIN <=0.25 SENSITIVE Sensitive   ?  GENTAMICIN <=1 SENSITIVE Sensitive   ?  IMIPENEM <=0.25 SENSITIVE Sensitive   ?   TRIMETH/SULFA <=20 SENSITIVE Sensitive   ?  AMPICILLIN/SULBACTAM 16 INTERMEDIATE Intermediate   ?  PIP/TAZO <=4 SENSITIVE Sensitive   ?  * MODERATE ESCHERICHIA COLI  ?MRSA Next Gen by PCR, Nasal     Status: None  ? Collection

## 2022-03-01 ENCOUNTER — Inpatient Hospital Stay (HOSPITAL_COMMUNITY): Payer: BC Managed Care – PPO | Admitting: Certified Registered"

## 2022-03-01 ENCOUNTER — Inpatient Hospital Stay (HOSPITAL_COMMUNITY): Payer: BC Managed Care – PPO

## 2022-03-01 ENCOUNTER — Inpatient Hospital Stay (HOSPITAL_COMMUNITY)
Admission: EM | Disposition: A | Discharge status: Other Acute Inpatient Hospital | Payer: Self-pay | Source: Home / Self Care | Attending: Internal Medicine

## 2022-03-01 ENCOUNTER — Other Ambulatory Visit: Payer: Self-pay

## 2022-03-01 DIAGNOSIS — J9601 Acute respiratory failure with hypoxia: Secondary | ICD-10-CM | POA: Diagnosis not present

## 2022-03-01 DIAGNOSIS — G061 Intraspinal abscess and granuloma: Secondary | ICD-10-CM | POA: Diagnosis not present

## 2022-03-01 DIAGNOSIS — A419 Sepsis, unspecified organism: Secondary | ICD-10-CM | POA: Diagnosis not present

## 2022-03-01 DIAGNOSIS — Z9911 Dependence on respirator [ventilator] status: Secondary | ICD-10-CM | POA: Diagnosis not present

## 2022-03-01 HISTORY — PX: LUMBAR LAMINECTOMY/DECOMPRESSION MICRODISCECTOMY: SHX5026

## 2022-03-01 LAB — SODIUM
Sodium: 144 mmol/L (ref 135–145)
Sodium: 144 mmol/L (ref 135–145)
Sodium: 147 mmol/L — ABNORMAL HIGH (ref 135–145)
Sodium: 147 mmol/L — ABNORMAL HIGH (ref 135–145)
Sodium: 147 mmol/L — ABNORMAL HIGH (ref 135–145)

## 2022-03-01 LAB — GLUCOSE, CAPILLARY
Glucose-Capillary: 127 mg/dL — ABNORMAL HIGH (ref 70–99)
Glucose-Capillary: 136 mg/dL — ABNORMAL HIGH (ref 70–99)
Glucose-Capillary: 142 mg/dL — ABNORMAL HIGH (ref 70–99)
Glucose-Capillary: 149 mg/dL — ABNORMAL HIGH (ref 70–99)
Glucose-Capillary: 158 mg/dL — ABNORMAL HIGH (ref 70–99)
Glucose-Capillary: 182 mg/dL — ABNORMAL HIGH (ref 70–99)

## 2022-03-01 LAB — BASIC METABOLIC PANEL
Anion gap: 8 (ref 5–15)
BUN: 26 mg/dL — ABNORMAL HIGH (ref 6–20)
CO2: 24 mmol/L (ref 22–32)
Calcium: 7.6 mg/dL — ABNORMAL LOW (ref 8.9–10.3)
Chloride: 112 mmol/L — ABNORMAL HIGH (ref 98–111)
Creatinine, Ser: 1.1 mg/dL — ABNORMAL HIGH (ref 0.44–1.00)
GFR, Estimated: 59 mL/min — ABNORMAL LOW (ref >60)
Glucose, Bld: 154 mg/dL — ABNORMAL HIGH (ref 70–99)
Potassium: 3.8 mmol/L (ref 3.5–5.1)
Sodium: 144 mmol/L (ref 135–145)

## 2022-03-01 LAB — CBC
HCT: 30.4 % — ABNORMAL LOW (ref 36.0–46.0)
Hemoglobin: 9.8 g/dL — ABNORMAL LOW (ref 12.0–15.0)
MCH: 29.3 pg (ref 26.0–34.0)
MCHC: 32.2 g/dL (ref 30.0–36.0)
MCV: 91 fL (ref 80.0–100.0)
Platelets: 210 10*3/uL (ref 150–400)
RBC: 3.34 MIL/uL — ABNORMAL LOW (ref 3.87–5.11)
RDW: 16.4 % — ABNORMAL HIGH (ref 11.5–15.5)
WBC: 7.1 10*3/uL (ref 4.0–10.5)
nRBC: 0 % (ref 0.0–0.2)

## 2022-03-01 LAB — AEROBIC/ANAEROBIC CULTURE W GRAM STAIN (SURGICAL/DEEP WOUND)

## 2022-03-01 LAB — CULTURE, RESPIRATORY W GRAM STAIN

## 2022-03-01 LAB — PHOSPHORUS: Phosphorus: 3.6 mg/dL (ref 2.5–4.6)

## 2022-03-01 LAB — MAGNESIUM: Magnesium: 1.9 mg/dL (ref 1.7–2.4)

## 2022-03-01 SURGERY — LUMBAR LAMINECTOMY/DECOMPRESSION MICRODISCECTOMY 1 LEVEL
Anesthesia: General | Site: Back

## 2022-03-01 MED ORDER — FLEET ENEMA 7-19 GM/118ML RE ENEM: 1.0000 | ENEMA | Freq: Once | RECTAL | Status: DC | PRN

## 2022-03-01 MED ORDER — THROMBIN 5000 UNITS EX SOLR
OROMUCOSAL | Status: DC | PRN
  Administered 2022-03-01: 5 mL via TOPICAL

## 2022-03-01 MED ORDER — THROMBIN 20000 UNITS EX SOLR
CUTANEOUS | Status: AC
  Filled 2022-03-01: qty 20000

## 2022-03-01 MED ORDER — LIDOCAINE-EPINEPHRINE 1 %-1:100000 IJ SOLN
INTRAMUSCULAR | Status: DC | PRN
  Administered 2022-03-01: 5 mL

## 2022-03-01 MED ORDER — SODIUM CHLORIDE 0.9% FLUSH
3.0000 mL | Freq: Two times a day (BID) | INTRAVENOUS | Status: DC
  Administered 2022-03-01 – 2022-03-07 (×8): 3 mL via INTRAVENOUS

## 2022-03-01 MED ORDER — ROCURONIUM BROMIDE 10 MG/ML (PF) SYRINGE
PREFILLED_SYRINGE | INTRAVENOUS | Status: DC | PRN
  Administered 2022-03-01: 20 mg via INTRAVENOUS
  Administered 2022-03-01: 50 mg via INTRAVENOUS

## 2022-03-01 MED ORDER — ONDANSETRON HCL 4 MG/2ML IJ SOLN
INTRAMUSCULAR | Status: AC
  Filled 2022-03-01: qty 2

## 2022-03-01 MED ORDER — POLYETHYLENE GLYCOL 3350 17 G PO PACK: 17.0000 g | PACK | Freq: Every day | ORAL | Status: DC | PRN

## 2022-03-01 MED ORDER — SODIUM CHLORIDE 0.9% FLUSH: 3.0000 mL | INTRAVENOUS | Status: DC | PRN

## 2022-03-01 MED ORDER — PHENYLEPHRINE 80 MCG/ML (10ML) SYRINGE FOR IV PUSH (FOR BLOOD PRESSURE SUPPORT)
PREFILLED_SYRINGE | INTRAVENOUS | Status: DC | PRN
  Administered 2022-03-01 (×3): 80 ug via INTRAVENOUS

## 2022-03-01 MED ORDER — LIDOCAINE-EPINEPHRINE 1 %-1:100000 IJ SOLN
INTRAMUSCULAR | Status: AC
  Filled 2022-03-01: qty 1

## 2022-03-01 MED ORDER — PROPOFOL 10 MG/ML IV BOLUS
INTRAVENOUS | Status: AC
  Filled 2022-03-01: qty 20

## 2022-03-01 MED ORDER — MIDAZOLAM HCL 2 MG/2ML IJ SOLN
INTRAMUSCULAR | Status: DC | PRN
  Administered 2022-03-01: 2 mg via INTRAVENOUS

## 2022-03-01 MED ORDER — FENTANYL CITRATE (PF) 250 MCG/5ML IJ SOLN
INTRAMUSCULAR | Status: DC | PRN
  Administered 2022-03-01: 50 ug via INTRAVENOUS

## 2022-03-01 MED ORDER — BUPIVACAINE HCL (PF) 0.5 % IJ SOLN
INTRAMUSCULAR | Status: AC
Start: 2022-03-01 — End: ?
  Filled 2022-03-01: qty 30

## 2022-03-01 MED ORDER — FENTANYL CITRATE (PF) 250 MCG/5ML IJ SOLN
INTRAMUSCULAR | Status: AC
  Filled 2022-03-01: qty 5

## 2022-03-01 MED ORDER — DOCUSATE SODIUM 100 MG PO CAPS: 100.0000 mg | ORAL_CAPSULE | Freq: Two times a day (BID) | ORAL | Status: DC

## 2022-03-01 MED ORDER — 0.9 % SODIUM CHLORIDE (POUR BTL) OPTIME
TOPICAL | Status: DC | PRN
  Administered 2022-03-01: 1000 mL

## 2022-03-01 MED ORDER — LACTATED RINGERS IV SOLN: INTRAVENOUS | Status: DC | PRN

## 2022-03-01 MED ORDER — ONDANSETRON HCL 4 MG/2ML IJ SOLN
INTRAMUSCULAR | Status: DC | PRN
  Administered 2022-03-01: 4 mg via INTRAVENOUS

## 2022-03-01 MED ORDER — PHENOL 1.4 % MT LIQD: 1.0000 | OROMUCOSAL | Status: DC | PRN

## 2022-03-01 MED ORDER — PIVOT 1.5 CAL PO LIQD
1000.0000 mL | ORAL | Status: AC
  Administered 2022-03-01 – 2022-03-05 (×3): 1000 mL

## 2022-03-01 MED ORDER — ONDANSETRON HCL 4 MG PO TABS: 4.0000 mg | ORAL_TABLET | Freq: Four times a day (QID) | ORAL | Status: DC | PRN

## 2022-03-01 MED ORDER — ONDANSETRON HCL 4 MG/2ML IJ SOLN: 4.0000 mg | Freq: Four times a day (QID) | INTRAMUSCULAR | Status: DC | PRN

## 2022-03-01 MED ORDER — THROMBIN 20000 UNITS EX SOLR
CUTANEOUS | Status: DC | PRN
  Administered 2022-03-01: 20 mL via TOPICAL

## 2022-03-01 MED ORDER — SENNA 8.6 MG PO TABS
1.0000 | ORAL_TABLET | Freq: Two times a day (BID) | ORAL | Status: DC
  Administered 2022-03-07 – 2022-03-10 (×5): 8.6 mg
  Filled 2022-03-01 (×6): qty 1

## 2022-03-01 MED ORDER — MENTHOL 3 MG MT LOZG: 1.0000 | LOZENGE | OROMUCOSAL | Status: DC | PRN

## 2022-03-01 MED ORDER — THROMBIN 5000 UNITS EX SOLR
CUTANEOUS | Status: AC
  Filled 2022-03-01: qty 5000

## 2022-03-01 MED ORDER — BUPIVACAINE HCL (PF) 0.5 % IJ SOLN
INTRAMUSCULAR | Status: DC | PRN
  Administered 2022-03-01: 25 mL
  Administered 2022-03-01: 5 mL

## 2022-03-01 MED ORDER — SODIUM CHLORIDE (PF) 0.9 % IJ SOLN
INTRAMUSCULAR | Status: AC
  Filled 2022-03-01: qty 10

## 2022-03-01 MED ORDER — PHENYLEPHRINE 80 MCG/ML (10ML) SYRINGE FOR IV PUSH (FOR BLOOD PRESSURE SUPPORT)
PREFILLED_SYRINGE | INTRAVENOUS | Status: AC
  Filled 2022-03-01: qty 20

## 2022-03-01 MED ORDER — FUROSEMIDE 10 MG/ML IJ SOLN
40.0000 mg | Freq: Once | INTRAMUSCULAR | Status: AC
  Administered 2022-03-01: 40 mg via INTRAVENOUS
  Filled 2022-03-01: qty 4

## 2022-03-01 MED ORDER — MIDAZOLAM HCL 2 MG/2ML IJ SOLN
INTRAMUSCULAR | Status: AC
  Filled 2022-03-01: qty 2

## 2022-03-01 MED ORDER — INSULIN GLARGINE-YFGN 100 UNIT/ML ~~LOC~~ SOLN
12.0000 [IU] | Freq: Every day | SUBCUTANEOUS | Status: DC
  Administered 2022-03-01: 12 [IU] via SUBCUTANEOUS
  Filled 2022-03-01 (×2): qty 0.12

## 2022-03-01 SURGICAL SUPPLY — 53 items
ADH SKN CLS APL DERMABOND .7 (GAUZE/BANDAGES/DRESSINGS) ×1
BAG COUNTER SPONGE SURGICOUNT (BAG) ×3 IMPLANT
BAG SPNG CNTER NS LX DISP (BAG) ×1
BAND INSRT 18 STRL LF DISP RB (MISCELLANEOUS)
BAND RUBBER #18 3X1/16 STRL (MISCELLANEOUS) IMPLANT
BLADE CLIPPER SURG (BLADE) IMPLANT
BUR ACORN 6.0 (BURR) IMPLANT
BUR MATCHSTICK NEURO 3.0 LAGG (BURR) ×3 IMPLANT
CANISTER SUCT 3000ML PPV (MISCELLANEOUS) ×3 IMPLANT
DECANTER SPIKE VIAL GLASS SM (MISCELLANEOUS) ×3 IMPLANT
DERMABOND ADVANCED (GAUZE/BANDAGES/DRESSINGS) ×1
DERMABOND ADVANCED .7 DNX12 (GAUZE/BANDAGES/DRESSINGS) ×2 IMPLANT
DEVICE DISSECT PLASMABLAD 3.0S (MISCELLANEOUS) ×2 IMPLANT
DRAPE HALF SHEET 40X57 (DRAPES) IMPLANT
DRAPE LAPAROTOMY T 102X78X121 (DRAPES) ×3 IMPLANT
DRAPE MICROSCOPE LEICA (MISCELLANEOUS) IMPLANT
DRSG OPSITE POSTOP 4X6 (GAUZE/BANDAGES/DRESSINGS) ×1 IMPLANT
DURAPREP 26ML APPLICATOR (WOUND CARE) ×3 IMPLANT
ELECT REM PT RETURN 9FT ADLT (ELECTROSURGICAL) ×2
ELECTRODE REM PT RTRN 9FT ADLT (ELECTROSURGICAL) ×2 IMPLANT
GAUZE 4X4 16PLY ~~LOC~~+RFID DBL (SPONGE) IMPLANT
GAUZE SPONGE 4X4 12PLY STRL (GAUZE/BANDAGES/DRESSINGS) ×3 IMPLANT
GLOVE BIO SURGEON STRL SZ7 (GLOVE) ×1 IMPLANT
GLOVE BIOGEL PI IND STRL 7.5 (GLOVE) IMPLANT
GLOVE BIOGEL PI IND STRL 8.5 (GLOVE) ×2 IMPLANT
GLOVE BIOGEL PI INDICATOR 7.5 (GLOVE) ×1
GLOVE BIOGEL PI INDICATOR 8.5 (GLOVE) ×1
GLOVE ECLIPSE 8.5 STRL (GLOVE) ×3 IMPLANT
GOWN STRL REUS W/ TWL LRG LVL3 (GOWN DISPOSABLE) IMPLANT
GOWN STRL REUS W/ TWL XL LVL3 (GOWN DISPOSABLE) IMPLANT
GOWN STRL REUS W/TWL 2XL LVL3 (GOWN DISPOSABLE) ×3 IMPLANT
GOWN STRL REUS W/TWL LRG LVL3 (GOWN DISPOSABLE)
GOWN STRL REUS W/TWL XL LVL3 (GOWN DISPOSABLE)
KIT BASIN OR (CUSTOM PROCEDURE TRAY) ×3 IMPLANT
KIT TURNOVER KIT B (KITS) ×3 IMPLANT
NDL SPNL 20GX3.5 QUINCKE YW (NEEDLE) IMPLANT
NEEDLE HYPO 22GX1.5 SAFETY (NEEDLE) ×3 IMPLANT
NEEDLE SPNL 20GX3.5 QUINCKE YW (NEEDLE) IMPLANT
NS IRRIG 1000ML POUR BTL (IV SOLUTION) ×3 IMPLANT
PACK LAMINECTOMY NEURO (CUSTOM PROCEDURE TRAY) ×3 IMPLANT
PAD ARMBOARD 7.5X6 YLW CONV (MISCELLANEOUS) ×9 IMPLANT
PATTIES SURGICAL .5 X1 (DISPOSABLE) ×3 IMPLANT
PLASMABLADE 3.0S (MISCELLANEOUS) ×2
SPONGE SURGIFOAM ABS GEL SZ50 (HEMOSTASIS) ×3 IMPLANT
SUT VIC AB 1 CT1 18XBRD ANBCTR (SUTURE) ×2 IMPLANT
SUT VIC AB 1 CT1 8-18 (SUTURE) ×2
SUT VIC AB 2-0 CP2 18 (SUTURE) ×3 IMPLANT
SUT VIC AB 3-0 SH 8-18 (SUTURE) ×3 IMPLANT
SUT VIC AB 4-0 RB1 18 (SUTURE) ×3 IMPLANT
TOWEL GREEN STERILE (TOWEL DISPOSABLE) ×3 IMPLANT
TOWEL GREEN STERILE FF (TOWEL DISPOSABLE) ×3 IMPLANT
TUBE FEEDING ENTERAL 5FR 16IN (TUBING) ×1 IMPLANT
WATER STERILE IRR 1000ML POUR (IV SOLUTION) ×3 IMPLANT

## 2022-03-01 NOTE — Op Note (Signed)
Date of surgery: 03/01/2022 Preoperative diagnosis: Cervical thoracic and lumbar epidural abscesses Postoperative diagnosis: Same Procedure: Thoracolumbar laminectomy and decompression of epidural abscess from thoracolumbar spine Surgeon: Kristeen Miss Anesthesia: General endotracheal Indications: Gabrielle Vincent is a 56 year old individual who has been sick for over 2 weeks time.  She had presented with a urinary tract infection and severe left-sided leg pain and subsequently became septic with severe agitation requiring intubation.  Subsequently noted that she stopped moving her extremities.  A follow-up MRI demonstrated presence of thoracic and lumbar epidural abscesses and a follow-up cervical MRI demonstrated that she had a cervical epidural abscess as well with cord compression she underwent emergent decompression of her cervical spine last Saturday and significant drainage of pus was achieved however the patient has remained febrile with a high lactic acid level and still very ill.  An MRI demonstrates the presence of good decompression in the cervical thoracic region but in the lower thoracic and lumbar regions there is still a considerable epidural abscess burden she is now taken to the operating room to undergo surgical debridement of the thoracolumbar spine.  Procedure: The patient was brought to the operating room supine on the patient's own bed already intubated.  She was then carefully turned prone after general endotracheal anesthesia was induced.  The back was prepped with alcohol DuraPrep and draped in a sterile fashion.  The midline portion at the thoracolumbar junction was identified positively on a radiograph with T12 being isolated.  Then a vertical incision was created and carried down to the lumbodorsal fascia.  The fascia was opened on the left side and a subperiosteal dissection was performed.  The hemilamina of T12 was removed to expose the epidural space immediately on entering the  epidural space pus under significant pressure presented itself.  This was evacuated and new cultures were taken for both gram-positive and gram-negative cultures.  The pus was then evacuated with blunt debridement in the epidural space and significant epidural fat that was demonstrating liquefied necrosis was removed.  Then a pediatric feeding tube was used to insert under the epidural space to allow further evacuation of significant liquefied pus along with necrotic fatty tissue from the epidural space.  The catheter could be slid as much as 10 cm cephalad and 8 cm inferiorly and each time pus under modest pressure was relieved.  Once the pus was relieved by sliding the catheter and evacuating this necrotic grumous material the epidural space was irrigated and further liquefied material was removed.  Several passes were performed both cephalad and inferiorly.  Once a reasonable decompression was felt to be had a left a small Hemovac drain in the epidural space.  The thoracodorsal fascia was then closed with #1 Vicryl and 2-0 Vicryl was used in the subcutaneous tissues 3-0 Vicryl was used in the final subcuticular layer and Dermabond was placed on the skin patient tolerated procedure well blood loss was estimated at less than 50 cc.

## 2022-03-01 NOTE — Progress Notes (Signed)
.                                                            [facility] Infectious Diseases Follow Up Note  Patient Identification: Patient Name: Gabrielle Vincent MRN: [id number] [place] Date: 02/14/2022  9:53 AM Age: 56 y.o.Today's Date: 03/01/2022  Reason for Visit: E. coli meningitis and epidural abscess  Principal Problem:   Abscess in epidural space of cervical spine Active Problems:   Sepsis secondary to UTI ([place])   Hyponatremia   Hypokalemia   AKI (acute kidney injury) (H[facility])   Abnormal LFTs   Class II obesity   Hepatic steatosis   Cholelithiasis   Esophageal thickening   Acute low back pain   Encephalopathy acute   Acute respiratory failure ([facility])   Radiculopathy of lumbar region   E. coli UTI   Meningitis   Bacterial meningitis   Diabetes insipidus ([place])   H/O excision of lamina of cervical vertebra for decompression of spinal cord   Antibiotics:  Ceftriaxone 5/3-5/5, cefepime 5/6-5/9, ceftriaxone 5/9-c Vancomycin 5/5-5/8, 5/14-c  Ampicillin 5/6-5/8 Acyclovir 5/6-5/8  Lines/Hardwares: Right IJ CVC  Interval Events: continues to be febrile although no leukocytosis, plan for OR later today by Neurosurgery  Assessment # E. coli UTI complicated with epidural abscess throughout T L with possible extension to C spine and meningitis/associated severe diffuse spinal stenosis  # L4-L5 and L5-s1 septic arthritis - 5/7 CSF Cx with E coli  -5/13 for posterior cervical decompression, laminectomy and medial facetectomy C3-T1 with instrumented fusion from C3-C7. OR cx with E coli  -Repeat MRI T spine 5/17 extensive thoracic epidural abscess similar in size to last MRI in 02/23/22, mild stenosisof T7 through T12 due to abscess, no evidence of discitis and OM. 2.2 cm spiculated enhancing mass right upper lobe concerning for carcinoma. - TTE 5/16 with no vegetations   #Acute hypoxemic respiratory failure: Status post mechanical ventilation for airway protection #Newly diagnosed DM: a1c  8.9 # Fevers # AKI: Cr appears stable/improving  Recommendations Ceftriaxone 2 g IV every 12. OK to DC vancomycin as no clear indication and persistent fevers spiking on Vancomycin is concerning for poor source control in her spine for which she is planned for OR later today.  Fu pending cultures ( sputum cx candida albicans, likely a colonize and no indication to treat), Neurosurgical plans, OR findings  Monitor CBC and BMP  D/w ID Pharmacy   Following  Rest of the management as per the primary team. Thank you for the consult. Please page with pertinent questions or concerns.  ______________________________________________________________________ Subjective patient seen and examined at the bedside.   Vitals BP (!) 127/59   Pulse 85   Temp (!) 102.2 F (39 C) Comment: ice packs placed, will give PRN tylenol  Resp (!) 30   Ht '5\' 4"'$  (1.626 m)   Wt 102.6 kg   LMP  (LMP Unknown)   SpO2 100%   BMI 38.83 kg/m     Physical Exam Constitutional: Orotracheally intubated, on Precedex and fentanyl    Comments:   Cardiovascular:     Rate and Rhythm: Normal rate and regular rhythm.     Heart sounds:   Pulmonary:     Effort: Pulmonary effort is normal on mechanical ventilation    Comments:   Abdominal:  Palpations: Abdomen is soft.     Tenderness: Nondistended  Musculoskeletal:        General: No swelling or erythema noted in peripheral joints, bilateral pedal edema   Skin:    Comments: No obvious rashes. RT arm PICC+  Neurological:     General: Unable to assess, follows commands in upper extremities   Pertinent Microbiology Results for orders placed or performed during the hospital encounter of 02/14/22  Urine Culture     Status: Abnormal   Collection Time: 02/14/22 10:40 AM   Specimen: Urine, Clean Catch  Result Value Ref Range Status   Specimen Description   Final    URINE, CLEAN CATCH Performed at [facility], [address]  [place], [place] [address]    Special Requests   Final    NONE Performed at [facility], [address] [place], [place] [address]    Culture >=100,000 COLONIES/mL ESCHERICHIA COLI (A)  Final   Report Status 02/16/2022 FINAL  Final   Organism ID, Bacteria ESCHERICHIA COLI (A)  Final      Susceptibility   Escherichia coli - MIC*    AMPICILLIN >=32 RESISTANT Resistant     CEFAZOLIN <=4 SENSITIVE Sensitive     CEFEPIME <=0.12 SENSITIVE Sensitive     CEFTRIAXONE <=0.25 SENSITIVE Sensitive     CIPROFLOXACIN <=0.25 SENSITIVE Sensitive     GENTAMICIN <=1 SENSITIVE Sensitive     IMIPENEM <=0.25 SENSITIVE Sensitive     NITROFURANTOIN <=16 SENSITIVE Sensitive     TRIMETH/SULFA <=20 SENSITIVE Sensitive     AMPICILLIN/SULBACTAM >=32 RESISTANT Resistant     PIP/TAZO <=4 SENSITIVE Sensitive     * >=100,000 COLONIES/mL ESCHERICHIA COLI  Culture, blood (Routine X 2) w Reflex to ID Panel     Status: None   Collection Time: 02/15/22 11:11 AM   Specimen: BLOOD  Result Value Ref Range Status   Specimen Description   Final    BLOOD BLOOD RIGHT HAND Performed at [facility] [address], [place], [place] [address]    Special Requests   Final    BOTTLES DRAWN AEROBIC AND ANAEROBIC Blood Culture adequate volume Performed at [facility] [address] [place], [place] [address]    Culture   Final    NO GROWTH 5 DAYS Performed at [facility], [place] [address], [place], [place] [address]    Report Status 02/20/2022 FINAL  Final  Culture, blood (Routine X 2) w Reflex to ID Panel     Status: None   Collection Time: 02/15/22 11:21 AM   Specimen: BLOOD  Result Value Ref Range Status   Specimen Description   Final    BLOOD BLOOD LEFT HAND Performed at [facility] [address] [place], [place] [address]    Special Requests   Final    BOTTLES DRAWN AEROBIC AND ANAEROBIC Blood Culture adequate volume Performed at  [facility] [address], [place], [place] [address]    Culture   Final    NO GROWTH 5 DAYS Performed at [facility], [place] [address] [place], [place] [address]    Report Status 02/20/2022 FINAL  Final  MRSA Next Gen by PCR, Nasal     Status: None   Collection Time: 02/16/22  3:06 PM   Specimen: Nasal Mucosa; Nasal Swab  Result Value Ref Range Status   MRSA by PCR Next Gen NOT DETECTED NOT DETECTED Final  Comment: (NOTE) The GeneXpert MRSA Assay (FDA approved for NASAL specimens only), is one component of a comprehensive MRSA colonization surveillance program. It is not intended to diagnose MRSA infection nor to guide or monitor treatment for MRSA infections. Test performance is not FDA approved in patients less than 33 years old. Performed at [facility], [place] [address] [place], [place] [address]   CSF culture w Gram Stain     Status: None   Collection Time: 02/18/22  1:45 PM   Specimen: PATH Cytology CSF; Cerebrospinal Fluid  Result Value Ref Range Status   Specimen Description   Final    CSF Performed at [facility] [address] [place], [place] [address]    Special Requests   Final    NONE Performed at [facility], [place] [address] [place], [place] [address]    Gram Stain   Final    WBC PRESENT,BOTH PMN AND MONONUCLEAR CYTOSPIN SMEAR RESULT CALLED TO, READ BACK BY AND VERIFIED WITH: Tyson Babinski RN AT 0539 02/18/22 BY [place],K CORRECTED RESULTS GRAM NEGATIVE RODS PREVIOUSLY REPORTED AS: GRAM POSITIVE COCCI CORRECTED RESULTS CALLED TO: PHARMD M.TUCKER AT 1133 ON 02/20/2022 BY T.SAAD.    Culture   Final    MODERATE ESCHERICHIA COLI CRITICAL RESULT CALLED TO, READ BACK BY AND VERIFIED WITH: RN M.SPENS AT 1254 ON 02/19/2022 BY T.SAAD. Performed at [facility], [place] [address], [place], [place] [address]    Report Status 02/20/2022 FINAL  Final   Organism ID, Bacteria  ESCHERICHIA COLI  Final      Susceptibility   Escherichia coli - MIC*    AMPICILLIN >=32 RESISTANT Resistant     CEFAZOLIN <=4 SENSITIVE Sensitive     CEFEPIME <=0.12 SENSITIVE Sensitive     CEFTAZIDIME <=1 SENSITIVE Sensitive     CEFTRIAXONE <=0.25 SENSITIVE Sensitive     CIPROFLOXACIN <=0.25 SENSITIVE Sensitive     GENTAMICIN <=1 SENSITIVE Sensitive     IMIPENEM <=0.25 SENSITIVE Sensitive     TRIMETH/SULFA <=20 SENSITIVE Sensitive     AMPICILLIN/SULBACTAM 16 INTERMEDIATE Intermediate     PIP/TAZO <=4 SENSITIVE Sensitive     * MODERATE ESCHERICHIA COLI  MRSA Next Gen by PCR, Nasal     Status: None   Collection Time: 02/24/22 12:12 AM   Specimen: Nasal Mucosa; Nasal Swab  Result Value Ref Range Status   MRSA by PCR Next Gen NOT DETECTED NOT DETECTED Final    Comment: (NOTE) The GeneXpert MRSA Assay (FDA approved for NASAL specimens only), is one component of a comprehensive MRSA colonization surveillance program. It is not intended to diagnose MRSA infection nor to guide or monitor treatment for MRSA infections. Test performance is not FDA approved in patients less than 62 years old. Performed at [facility], [place] [address], [place], [place] [address]   Aerobic/Anaerobic Culture w Gram Stain (surgical/deep wound)     Status: None (Preliminary result)   Collection Time: 02/24/22 11:00 AM   Specimen: Abscess  Result Value Ref Range Status   Specimen Description ABSCESS  Final   Special Requests NONE  Final   Gram Stain   Final    ABUNDANT WBC PRESENT, PREDOMINANTLY PMN NO ORGANISMS SEEN Performed at [facility], [place] [address] [place], [place] [address]    Culture   Final    RARE ESCHERICHIA COLI NO ANAEROBES ISOLATED; CULTURE IN PROGRESS FOR 5 DAYS    Report Status PENDING  Incomplete  Organism ID, Bacteria ESCHERICHIA COLI  Final      Susceptibility   Escherichia coli - MIC*    AMPICILLIN >=32 RESISTANT Resistant     CEFAZOLIN <=4 SENSITIVE Sensitive      CEFEPIME <=0.12 SENSITIVE Sensitive     CEFTAZIDIME <=1 SENSITIVE Sensitive     CEFTRIAXONE <=0.25 SENSITIVE Sensitive     CIPROFLOXACIN <=0.25 SENSITIVE Sensitive     GENTAMICIN <=1 SENSITIVE Sensitive     IMIPENEM <=0.25 SENSITIVE Sensitive     TRIMETH/SULFA <=20 SENSITIVE Sensitive     AMPICILLIN/SULBACTAM 16 INTERMEDIATE Intermediate     PIP/TAZO <=4 SENSITIVE Sensitive     * RARE ESCHERICHIA COLI  Culture, blood (Routine X 2) w Reflex to ID Panel     Status: None (Preliminary result)   Collection Time: 02/25/22 11:08 AM   Specimen: BLOOD  Result Value Ref Range Status   Specimen Description BLOOD BLOOD LEFT ARM  Final   Special Requests   Final    AEROBIC BOTTLE ONLY Blood Culture results may not be optimal due to an inadequate volume of blood received in culture bottles   Culture   Final    NO GROWTH 4 DAYS Performed at [facility] [address] [place], [place] [address]    Report Status PENDING  Incomplete  Culture, blood (Routine X 2) w Reflex to ID Panel     Status: None (Preliminary result)   Collection Time: 02/25/22 11:08 AM   Specimen: BLOOD  Result Value Ref Range Status   Specimen Description BLOOD BLOOD RIGHT ARM  Final   Special Requests   Final    BOTTLES DRAWN AEROBIC ONLY Blood Culture results may not be optimal due to an inadequate volume of blood received in culture bottles   Culture   Final    NO GROWTH 4 DAYS Performed at [facility], [place] [address], [place], [place] [address]    Report Status PENDING  Incomplete  Culture, Respiratory w Gram Stain     Status: None (Preliminary result)   Collection Time: 02/26/22 11:44 AM   Specimen: Tracheal Aspirate; Respiratory  Result Value Ref Range Status   Specimen Description TRACHEAL ASPIRATE  Final   Special Requests NONE  Final   Gram Stain   Final    MODERATE WBC PRESENT,BOTH PMN AND MONONUCLEAR NO ORGANISMS SEEN    Culture   Final    RARE CANDIDA ALBICANS CULTURE REINCUBATED FOR  BETTER GROWTH Performed at [facility], [place] [address], [place], [place] [address]    Report Status PENDING  Incomplete    Pertinent Lab.    Latest Ref Rng & Units 03/01/2022    5:52 AM 02/28/2022    5:34 AM 02/27/2022    4:31 AM  CBC  WBC 4.0 - 10.5 K/uL 7.1   8.3   7.3    Hemoglobin 12.0 - 15.0 g/dL 9.8   9.0   9.1    Hematocrit 36.0 - 46.0 % 30.4   29.2   29.2    Platelets 150 - 400 K/uL 210   204   171        Latest Ref Rng & Units 03/01/2022    8:34 AM 03/01/2022    5:52 AM 03/01/2022   12:21 AM  CMP  Glucose 70 - 99 mg/dL  154     BUN 6 - 20 mg/dL  26     Creatinine 0.44 - 1.00 mg/dL  1.10  Sodium 135 - 145 mmol/L 144   144   144    Potassium 3.5 - 5.1 mmol/L  3.8     Chloride 98 - 111 mmol/L  112     CO2 22 - 32 mmol/L  24     Calcium 8.9 - 10.3 mg/dL  7.6        Pertinent Imaging today Plain films and CT images have been personally visualized and interpreted; radiology reports have been reviewed. Decision misrking incorporated into the Impression / Recommendations.  No results found.   I spent approx 55 minutes for this patient encounter including review of prior medical records, coordination of care with primary/other specialist with greater than 50% of time being face to face/counseling and discussing diagnostics/treatment plan with the patient/family.  Electronically signed by:   Rosiland Oz, MD Infectious Disease Physician [facility] for Infectious Disease Pager: [phone]

## 2022-03-01 NOTE — Transfer of Care (Signed)
Immediate Anesthesia Transfer of Care Note  Patient: Gabrielle Vincent  Procedure(s) Performed: Thoracic eleven - twelve Laminectomy with drainage of epidural abscess (Back)  Patient Location: ICU  Anesthesia Type:General  Level of Consciousness: Patient remains intubated per anesthesia plan  Airway & Oxygen Therapy: Patient remains intubated per anesthesia plan and Patient placed on Ventilator (see vital sign flow sheet for setting)  Post-op Assessment: Report given to RN and Post -op Vital signs reviewed and stable  Post vital signs: Reviewed and stable  Last Vitals:  Vitals Value Taken Time  BP 160/61 03/01/22 1950  Temp    Pulse 81 03/01/22 1954  Resp 24 03/01/22 1954  SpO2 94 % 03/01/22 1954  Vitals shown include unvalidated device data.  Last Pain:  Vitals:   03/01/22 1740  TempSrc: Axillary  PainSc:       Patients Stated Pain Goal: 7 (84/85/92 7639)  Complications: No notable events documented.

## 2022-03-01 NOTE — Anesthesia Preprocedure Evaluation (Addendum)
Anesthesia Evaluation  Patient identified by MRN, date of birth, ID band Patient unresponsive    Reviewed: Allergy & Precautions, Patient's Chart, lab work & pertinent test results  Airway Mallampati: Intubated       Dental   Pulmonary  Intubated      + intubated    Cardiovascular  Rhythm:Regular Rate:Normal     Neuro/Psych Sedated  Neuromuscular disease    GI/Hepatic   Endo/Other    Renal/GU      Musculoskeletal  (+) Arthritis ,   Abdominal (+) + obese,   Peds  Hematology  (+) Blood dyscrasia, anemia ,   Anesthesia Other Findings Epidural abscess  Reproductive/Obstetrics                           Anesthesia Physical Anesthesia Plan  ASA: 4  Anesthesia Plan: General   Post-op Pain Management:    Induction: Intravenous  PONV Risk Score and Plan: 3 and Ondansetron, Dexamethasone, Midazolam and Treatment may vary due to age or medical condition  Airway Management Planned: Oral ETT  Additional Equipment:   Intra-op Plan:   Post-operative Plan: Post-operative intubation/ventilation  Informed Consent: I have reviewed the patients History and Physical, chart, labs and discussed the procedure including the risks, benefits and alternatives for the proposed anesthesia with the patient or authorized representative who has indicated his/her understanding and acceptance.     Consent reviewed with POA  Plan Discussed with: CRNA  Anesthesia Plan Comments:       Anesthesia Quick Evaluation

## 2022-03-01 NOTE — Progress Notes (Signed)
Patient ID: Gabrielle Vincent, female   DOB: 1965/12/16, 56 y.o.   MRN: [id number] I had approximately 20-minute discussion with the patient's sister and brother yesterday regarding patient's situation and the findings of the MRI from 02/27/2021.  Patient has a persistent collection of epidural fluid/phlegmon at the level of T11 and T12 down to about L2 or L3.  I have advised that the mass should be decompressed.  I have discussed this with Dr. Carlis Abbott and is still felt that the patient is battling infection and not resolving the situation.  She has had great pain in her spine and is markedly uncomfortable when pain medication is withdrawn even slightly.  She does appear to be quadriplegic though and is not certain that any further surgery is going to reverse this.  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  After explaining the rationale for the surgery the patient's brother and sister concur and we are planning on proceeding today with decompression at the thoracolumbar junction at the level of T11-T12.  At the time of surgery I will remove the drain from the cervical spine.

## 2022-03-01 NOTE — Progress Notes (Signed)
NAME:  Gabrielle Vincent, MRN:  [id number], DOB:  Jan 16, 1966, LOS: 6 ADMISSION DATE:  02/14/2022, CONSULTATION DATE:  5/5 REFERRING MD:  Rai, CHIEF COMPLAINT:  acute encephalopathy and sepsis    History of Present Illness:  56 year old female who was admitted on 5/3 with chief complaint of severe low back pain radiating down her left leg.  Also had associated hematuria and dysuria.  She had no weakness although low did say pain impacted her ability to walk.  Also noted poor p.o. intake, decreased urination as well as urinary retention, hematuria, dysuria, and some nausea.   CT scan was negative for obstructive uropathy did show fatty liver infiltrates, there is cholelithiasis but no acute cholecystitis there is mild circumferential thickening of the distal esophagus global enlargement of the uterus with thickened appearance of the endometrial stripe with radiology recommending nonemergent pelvic ultrasound.  Diagnostic evaluation notable for new AKI with serum creatinine 3.64, acute transaminitis leukocytosis, and hyponatremia of 127 she was admitted for further evaluation, cultures were sent, and she was started on IV ceftriaxone.  On 5/3 patient was noted to be more confused, speech was slurred , Working diagnosis was #1 urinary tract infection with resultant sepsis and also low back pain for which neurosurgery was consulted as MRI finding showed acute herniated L3-L4 disc with left lumbar radiculopathy.  It was felt that pain control via lumbar injection may help in that this could be treated conservatively.  On 5/4 patient becoming intermittently agitated then lethargic, because of this a CT of head was obtained this was severely limited due to motion degraded meant but there was concern about hypodense area in the right basal ganglia section raising concern for acute infarct.  On 5/5 a rapid response was called the patient was severely agitated, pulled out IVs, pull out Foley catheter  Later that  afternoon mental status continued to worsen.  Neurology consult was obtained in addition to this infectious disease was consulted with urine growing E. coli and narrowed antibiotics to cefazolin stopped vancomycin and recommended supportive care because her mental status continued to decline, she had severe metabolic derangements, and we were unable to provide medical care on the [place] ward she was transferred to the ICU for higher level of care.  On initial arrival she was agitated requiring several nurses to hold her down, tachypneic tachycardic would not follow commands had marked increased work of breathing.  An intraosseous line was placed in the right lower extremity, she was intubated for airway protection to facilitate further MRI imaging, and a right IJ triple-lumen catheter was placed  Pertinent  Medical History  Class II obesity, hepatic steatosis, seasonal allergies, varicose veins diverticulosis, HL   Significant Hospital Events: Including procedures, antibiotic start and stop dates in addition to other pertinent events   5/3 admitted with back pain and urinary tract infection , Further complicated by AKI hyponatremia and multiple metabolic derangements.   CT imaging for renal stones showed no acute abdominal pelvic findings there was no obstructive uropathy there was severe fatty liver disease there was cholelithiasis but no cholecystitis there is colonic diverticulosis but no diverticulitis, mild circumferential thickening of the esophagus globular enlargement of the uterus with radiology recommending nonemergent pelvic ultrasound found to have uterine fibroid abdominal Ultrasound showed fatty liver but was epididymides negative.   MRI of the lumbar spine showed small left subarticular disc extrusion with inferior migration at L3-L4 correlating with radiculopathy.  She was started on ceftriaxone cultures were sent 5/4 increased confusion CT  brain obtained raising concern for possible acute  infarct in the right basal ganglia 5/5 progressive encephalopathy , Worsening leukocytosis, hypernatremia, hyperchloremia, slowly improving renal function, slowly improving procalcitonin, moved to ICU due to delirium, intubated for airway protection and to facilitate MRI imaging right IJ central line placed due to limited IV access.  Seen by neurology, also seen by infectious disease with ceftriaxone changed to cefazolin 5/6 antibiotics changed to meningitis coverage given MRI findings of fluid in the ventricles.  Image guided LP ordered 5/8 LP done with purulent CSF, studies consistent with bacterial meningitis, culture growing gm + cocci 5/10 significant rise in sodium with massive urine output of greater than 7 L leading to concern for development of central DI.  DDAVP and volume resuscitation started 5/11 hypernatremia slowly downtrending, continue DDAVP and IV resuscitation per nephrology 5/12 Sodium downtrended to 147 this am 5/13 to OR for posterior cervical decompressive laminectomy and medial facetectomy C3-T1 for evacuation of epirudal abscess, posterior cervical arthrodesis C3-C7, segmental fixation C3-7  Interim History / Subjective:  Tmax 102.4. OR this afternoon. She denies complaints this morning.  Objective   Blood pressure (!) 115/57, pulse 80, temperature (!) 101.3 F (38.5 C), temperature source Axillary, resp. rate (!) 24, height '5\' 4"'$  (1.626 m), weight 102.6 kg, SpO2 100 %.    Vent Mode: PRVC FiO2 (%):  [30 %] 30 % Set Rate:  [24 bmp] 24 bmp Vt Set:  [440 mL] 440 mL PEEP:  [5 cmH20] 5 cmH20 Pressure Support:  [8 cmH20] 8 cmH20 Plateau Pressure:  [18 cmH20-22 cmH20] 18 cmH20   Intake/Output Summary (Last 24 hours) at 03/01/2022 0724 Last data filed at 03/01/2022 0700 Gross per 24 hour  Intake 3169.97 ml  Output 8125 ml  Net -4955.03 ml    Filed Weights   02/23/22 2300 02/27/22 0453 02/28/22 0315  Weight: 102.2 kg 103.3 kg 102.6 kg   Physical exam: General:  critically ill appearing woman lying in bed in NAD Neuro: RASS -4, not moving legs, can weakly shrug shoulders, not following commands today to move her hands or bend elbows. Moderate strength cough reflex, brisk. HEENT: /AT, eyes anicteric, ETT in place.  Cardiovascular: S1S2, RRR Lungs: synchronous with MV, Vt ~350cc on PS 8. CTAB. Moderate clear secretions from ETT. Abdomen: soft, NT, hypoactive bowel sounds Musculoskeletal: ++LE edema, no cyanosis or clubbing Skin: warm, dry, no diffuse rashes GU: foley with amber urine  Resp culture> rare yeast Blood cultures 5/14> NGTD Abscess> E. Coli, no anaerobes x 5 days  BG 140-200s H/H 9.8/30.4 Na+ 144 BUN 26 Cr 1.1  Assessment & Plan:  Principal Problem:   Abscess in epidural space of cervical spine Active Problems:   Sepsis secondary to UTI (HCC)   Hyponatremia   Hypokalemia   AKI (acute kidney injury) (HCC)   Abnormal LFTs   Class II obesity   Hepatic steatosis   Cholelithiasis   Esophageal thickening   Acute low back pain   Encephalopathy acute   Acute respiratory failure (HCC)   Radiculopathy of lumbar region   E. coli UTI   Meningitis   Bacterial meningitis   Diabetes insipidus ([place])   H/O excision of lamina of cervical vertebra for decompression of spinal cord    Acute bacterial meningitis with E. Coli due to direct extension from epidural abscess Septic shock secondary to E. coli UTI and E.coli meningitis Extensive epidural abscess extending from foramen magnum to the sacral spine s/p C3-T1 laminectomy and evacuation of abscess Possible quadriparesis -  2/2 above -con't ceftriaxone; appreciate ID's recommendations -Appreciate NS's management; OR today, hopefully will improve source control -con't to follow cultures -vasopressors as required to maintain  MAP >76  Acute metabolic/septic encephalopathy  -sedation per PAD protocol; goal RASS 0 to -1, but pain control has been a limitation  Central DI  -  presumed 2/2 meningitis, improving with DDAVP -DDAVP 4 times daily -con't monitoring Na+  AKI NAGMA - resolved  Anasarca with hypervolemia +7L net -Continue DDAVP with free water flushes (increased to 200 q4hrs on 5/16) -one dose of lasix today since going to the OR and less enteral intake -TED hose  Acute hypoxic respiratory failure requiring MV Difficult airway-- anterior and had glottic edema -needs cuff leak test prior to extubation attempts -LTVV -VAP prevention protocol -PAD protocol for sedation -wean FiO2 to maintain SpO2 >90% -daily SAT & SBT-- failed today due to high rate and low volumes -Anticipate she will require a trach if family wishes to continue aggressive care. I discussed this with her sister Crystal yesterday, who was not ready to consider this yet. High risk with NM weakness that she will have an ineffective cough.   Shock liver superimposed on known fatty liver disease - improved -no additional monitoring needed  Anemia of critical illness -transfuse for Hb <7 or hemodynamically significant bleeding  Hyperglycemia, uncontrolled DM. A1c 8.9. -SSI PRN -increase glargine to 12 units daily; once TF resume can increase further -TF coverage; aspart 8 units Q4h with hold parameters -goal BG 140-180  RUL nodule, concerning for primary lung cancer - Will need further workup if survives current illness/acute issues. Not currently a candidate for biopsy or treatment due to critical illness.  Best Practice (right click and "Reselect all SmartList Selections" daily)   Diet/type: NPO, tube feeds held for OR DVT prophylaxis: prophylactic heparin  GI prophylaxis: PPI Lines: PICC placed 5/15 Foley:  Yes, and it is still needed Code Status:  full code Last date of multidisciplinary goals of care discussion: 5/11, IPAL note dictated 5/10 Updated sister via phone 5/17  Critical care time: 40 min.    Julian Hy, DO 03/01/22 8:58 AM [facility]

## 2022-03-01 NOTE — Progress Notes (Signed)
Nutrition Follow-up  DOCUMENTATION CODES:   Obesity unspecified  INTERVENTION:   Tube feeding via OG tube: D/C Osmolite 1.2 and Prosource TF   Pivot 1.5 @ 55 ml/hr (1320 ml/day)  Provides 1980 kcal, 123 gm protein, 1001 ml free water daily  200 ml free water every 4 hours  Total free water: 2201 ml   NUTRITION DIAGNOSIS:   Inadequate oral intake related to inability to eat as evidenced by NPO status. Ongoing.   GOAL:   Provide needs based on ASPEN/SCCM guidelines Met with TF at goal   MONITOR:   Vent status, TF tolerance, Labs, Weight trends, Skin  REASON FOR ASSESSMENT:   Consult Enteral/tube feeding initiation and management  ASSESSMENT:   56 y.o. female with medical history of seasonal allergies, obesity, BLE swelling, varicose veins, HLD, hepatic steatosis, atherosclerosis, and diverticulosis. She presented to the ED due to severe low back pain that radiated to LLE and was associated with hematuria and dysuria. In the ED, she was noted to have multiple electrolyte abnormalities. CT renal stone study showed no obstructive uropathy, severe fatty infiltration of the liver with mild hepatomegaly, cholelithiasis with no acute cholecystitis, colonic diverticulosis, and mild circumferential thickening of the distal esophagus may represent esophagitis. She was admitted for sepsis 2/2 UTI.  Pt discussed during ICU rounds and with RN and MD.  Pt being treated for bacterial meningitis with E.coli due to direct extension from epidural abscess.   Per neurosurgery pt has persistent collection of epidural fluid and plans to go to OR today for decompression at T11-T12. Per neurosurgery pt appears quadriplegic and not sure further surgery will reverse this. Also noted venous infarcts in the cord at the cervical thoracic junction.     Admission weight: 95.3 kg Current weight: 102.6 kg  Moderate edema noted   5/3 admitted with back pain and UTI 5/5 intubated with progressive  encephalopathy 5/8 s/p LP consistent with bacterial meningitis 5/10 developed DI due to meningitis  5/13 s/p OR for decompressive laminectomy and evacuation of epidural abscess   Medications reviewed and include: colace, SSI, novolog, semglee, protonix, miralax  Precedex  Levophed @ 3 mcg   Labs reviewed: Na 147 Vitamin B12: 4495 (5/5)  CBG's: 136-149  UOP: 7900 ml  Hemovac: 25 ml   OG tube in place since 5/5; tip in gastric body per xray   Diet Order:   Diet Order             Diet NPO time specified  Diet effective midnight                   EDUCATION NEEDS:   No education needs have been identified at this time  Skin:  Skin Assessment: Reviewed RN Assessment  Last BM:  200 ml via rectal tube  Height:   Ht Readings from Last 1 Encounters:  02/21/22 5' 4"  (1.626 m)    Weight:   Wt Readings from Last 1 Encounters:  02/28/22 102.6 kg    Ideal Body Weight:  54.54 kg  BMI:  Body mass index is 38.83 kg/m.  Estimated Nutritional Needs:   Kcal:  1850-2100  Protein:  110-120 grams  Fluid:  >1.8 L/day  Lockie Pares., RD, LDN, CNSC See AMiON for contact information

## 2022-03-01 NOTE — Anesthesia Postprocedure Evaluation (Signed)
Anesthesia Post Note  Patient: Gabrielle Vincent  Procedure(s) Performed: Thoracic eleven - twelve Laminectomy with drainage of epidural abscess (Back)     Patient location during evaluation: SICU Anesthesia Type: General Level of consciousness: sedated Pain management: pain level controlled Vital Signs Assessment: post-procedure vital signs reviewed and stable Respiratory status: patient remains intubated per anesthesia plan Cardiovascular status: stable Postop Assessment: no apparent nausea or vomiting Anesthetic complications: no   No notable events documented.  Last Vitals:  Vitals:   03/01/22 1950 03/01/22 2000  BP: (!) 160/61 (!) 154/67  Pulse: 84 73  Resp:  (!) 24  Temp:    SpO2: 98% 94%    Last Pain:  Vitals:   03/01/22 1740  TempSrc: Axillary  PainSc:                  Gabrielle Vincent

## 2022-03-02 ENCOUNTER — Inpatient Hospital Stay (HOSPITAL_COMMUNITY): Payer: BC Managed Care – PPO

## 2022-03-02 ENCOUNTER — Encounter (HOSPITAL_COMMUNITY): Payer: Self-pay | Admitting: Neurological Surgery

## 2022-03-02 DIAGNOSIS — J9601 Acute respiratory failure with hypoxia: Secondary | ICD-10-CM | POA: Diagnosis not present

## 2022-03-02 DIAGNOSIS — R6521 Severe sepsis with septic shock: Secondary | ICD-10-CM | POA: Diagnosis not present

## 2022-03-02 DIAGNOSIS — A419 Sepsis, unspecified organism: Secondary | ICD-10-CM | POA: Diagnosis not present

## 2022-03-02 DIAGNOSIS — Z9911 Dependence on respirator [ventilator] status: Secondary | ICD-10-CM | POA: Diagnosis not present

## 2022-03-02 DIAGNOSIS — G061 Intraspinal abscess and granuloma: Secondary | ICD-10-CM | POA: Diagnosis not present

## 2022-03-02 LAB — BASIC METABOLIC PANEL
Anion gap: 9 (ref 5–15)
BUN: 33 mg/dL — ABNORMAL HIGH (ref 6–20)
CO2: 25 mmol/L (ref 22–32)
Calcium: 7.7 mg/dL — ABNORMAL LOW (ref 8.9–10.3)
Chloride: 117 mmol/L — ABNORMAL HIGH (ref 98–111)
Creatinine, Ser: 1.21 mg/dL — ABNORMAL HIGH (ref 0.44–1.00)
GFR, Estimated: 53 mL/min — ABNORMAL LOW (ref >60)
Glucose, Bld: 274 mg/dL — ABNORMAL HIGH (ref 70–99)
Potassium: 3.5 mmol/L (ref 3.5–5.1)
Sodium: 151 mmol/L — ABNORMAL HIGH (ref 135–145)

## 2022-03-02 LAB — GLUCOSE, CAPILLARY
Glucose-Capillary: 194 mg/dL — ABNORMAL HIGH (ref 70–99)
Glucose-Capillary: 241 mg/dL — ABNORMAL HIGH (ref 70–99)
Glucose-Capillary: 244 mg/dL — ABNORMAL HIGH (ref 70–99)
Glucose-Capillary: 185 mg/dL — ABNORMAL HIGH (ref 70–99)
Glucose-Capillary: 174 mg/dL — ABNORMAL HIGH (ref 70–99)
Glucose-Capillary: 165 mg/dL — ABNORMAL HIGH (ref 70–99)

## 2022-03-02 LAB — CULTURE, BLOOD (ROUTINE X 2)
Culture: NO GROWTH
Culture: NO GROWTH

## 2022-03-02 LAB — SODIUM
Sodium: 150 mmol/L — ABNORMAL HIGH (ref 135–145)
Sodium: 152 mmol/L — ABNORMAL HIGH (ref 135–145)
Sodium: 154 mmol/L — ABNORMAL HIGH (ref 135–145)

## 2022-03-02 IMAGING — DX DG ABD PORTABLE 1V
1 series · 1 of 1 positions shown · non-contrast
Comparison: Abdominal x-ray [DATE].

CLINICAL DATA: NG-tube.

EXAM:
PORTABLE ABDOMEN - 1 VIEW

[abdomen]
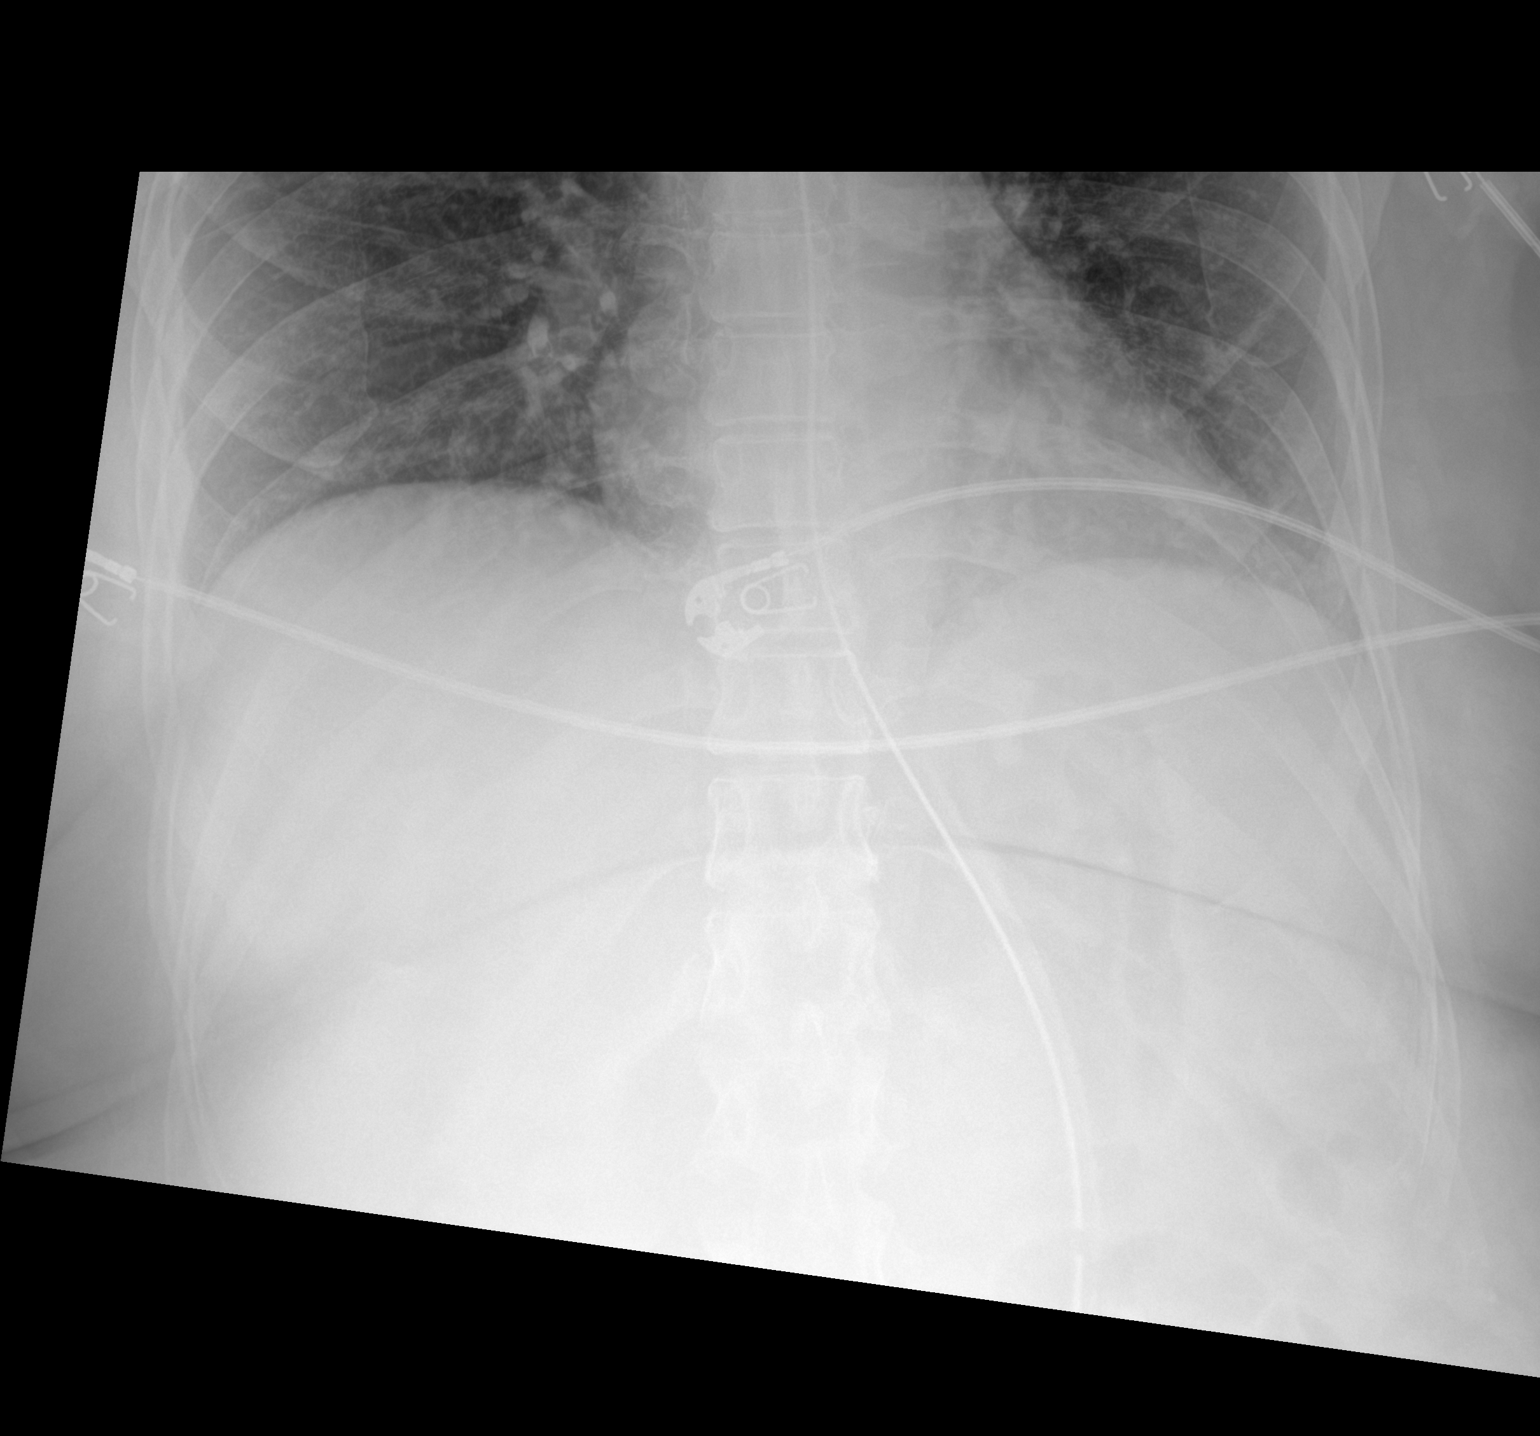

[1 of 1 positions shown; findings below may reference images not displayed]

FINDINGS: Nasogastric tube tip extends into the stomach. Distal tip is not
included on this image. No dilated bowel loops. Lung bases are
clear.
IMPRESSION: 1. Nasogastric tube extends into the stomach, but the distal tip is
not included on this image.

## 2022-03-02 MED ORDER — INSULIN GLARGINE-YFGN 100 UNIT/ML ~~LOC~~ SOLN
20.0000 [IU] | Freq: Every day | SUBCUTANEOUS | Status: DC
  Administered 2022-03-02 – 2022-03-03 (×2): 20 [IU] via SUBCUTANEOUS
  Filled 2022-03-02 (×2): qty 0.2

## 2022-03-02 MED ORDER — BISACODYL 10 MG RE SUPP: 10.0000 mg | Freq: Every day | RECTAL | Status: DC | PRN

## 2022-03-02 MED ORDER — CEFAZOLIN SODIUM-DEXTROSE 2-4 GM/100ML-% IV SOLN
2.0000 g | Freq: Three times a day (TID) | INTRAVENOUS | Status: AC
  Administered 2022-03-02 (×2): 2 g via INTRAVENOUS
  Filled 2022-03-02 (×2): qty 100

## 2022-03-02 MED ORDER — POTASSIUM CHLORIDE 20 MEQ PO PACK: 40.0000 meq | PACK | Freq: Once | ORAL | Status: DC

## 2022-03-02 MED ORDER — ACETAMINOPHEN 325 MG PO TABS: 650.0000 mg | ORAL_TABLET | ORAL | Status: DC | PRN

## 2022-03-02 MED ORDER — SODIUM CHLORIDE 0.9 % IV SOLN
250.0000 mL | INTRAVENOUS | Status: DC
  Administered 2022-03-03 – 2022-03-06 (×2): 250 mL via INTRAVENOUS

## 2022-03-02 MED ORDER — FREE WATER
200.0000 mL | Status: DC
  Administered 2022-03-02 – 2022-03-05 (×27): 200 mL

## 2022-03-02 MED ORDER — ACETAMINOPHEN 650 MG RE SUPP: 650.0000 mg | RECTAL | Status: DC | PRN

## 2022-03-02 MED FILL — Thrombin (Recombinant) For Soln 20000 Unit: CUTANEOUS | Qty: 1 | Status: AC

## 2022-03-02 MED FILL — Thrombin For Soln 5000 Unit: CUTANEOUS | Qty: 5000 | Status: AC

## 2022-03-02 MED FILL — Thrombin For Soln 20000 Unit: CUTANEOUS | Qty: 1 | Status: AC

## 2022-03-02 NOTE — Progress Notes (Signed)
Has tolerated 5/5 for about an hour. +cuff leak. Strong cough.   I have discussed with her 2 sisters at bedside that she is still at risk for reintubation given weakness from sepsis and her Faith injury. They wish to try to avoid trach and continue aggressive care. Plan is to reintubate if she fails. Known difficult airway from last intubation-- anterior and edema present. No empiric steroids currently due to already uncontrolled hyperglycemia and normal cuff leak test. Sisters at bedside for extubation.  Julian Hy, DO 03/02/22 2:53 PM [facility]

## 2022-03-02 NOTE — Progress Notes (Addendum)
.                                                            [facility] Infectious Diseases Follow Up Note  Patient Identification: Patient Name: Gabrielle Vincent MRN: [id number] [place] Date: 02/14/2022  9:53 AM Age: 56 y.o.Today's Date: 03/02/2022  Reason for Visit: E. coli meningitis and epidural abscess  Principal Problem:   Abscess in epidural space of cervical spine Active Problems:   Sepsis secondary to UTI (HCC)   Hyponatremia   Hypokalemia   AKI (acute kidney injury) (HCC)   Abnormal LFTs   Class II obesity   Hepatic steatosis   Cholelithiasis   Esophageal thickening   Acute low back pain   Encephalopathy acute   Acute respiratory failure (HCC)   Radiculopathy of lumbar region   E. 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 Thoracic drain removed   Assessment # E. coli UTI complicated with epidural abscess throughout T L with possible extension to C spine and meningitis/associated severe diffuse spinal stenosis  # L4-L5 and L5-s1 septic arthritis  - 5/7 CSF Cx with E coli  -5/13 posterior cervical decompression, laminectomy and medial facetectomy C3-T1 with instrumented fusion from C3-C7. OR cx with E coli  - 5/18  lumbar laminectomy and decompression of epidural abscess from thoracolumbar spine. OR note with significant pus evacuation. OR cx sent - TTE 5/16 with no vegetations   #Acute hypoxemic respiratory failure: Status post mechanical ventilation for airway protection. May need Trach per PCCM  #Newly diagnosed DM: a1c 8.9  # AKI: Cr appears stable/improving  # 2.2 cm  spiculated enhancing mass right upper lobe concerning for carcinoma- defer work up to Pulmonary   Recommendations Ceftriaxone 2 g IV every 12 Fu pending OR cultures to check if any  changes in abtx needed Monitor CBC and BMP Fu fevers, expect to resolve now with source control with Neurosurgery Fu with Neurosurgery recs Following cultures peripherally over the weekend  Dr Gale Journey covering this weekend. New ID team to assume care starting Monday.   Rest of the management as per the primary team. Thank you for the consult. Please page with pertinent questions or concerns.  ______________________________________________________________________ Subjective patient seen and examined at the bedside.  Spoke with RN, some improved movement in UE, no movement in LE  Vitals BP (!) 123/57   Pulse 74   Temp 100 F (37.8 C) (Axillary)   Resp (!) 24   Ht '5\' 4"'$  (1.626 m)   Wt 94.5 kg   LMP  (LMP Unknown)   SpO2 97%   BMI 35.76 kg/m     Physical Exam Constitutional: Orotracheally intubated, on Precedex and fentanyl,low dose pressors     Comments:   Cardiovascular:     Rate and Rhythm: Normal rate and regular rhythm.     Heart sounds:   Pulmonary:     Effort: Pulmonary effort is normal on mechanical  ventilation    Comments:   Abdominal:     Palpations: Abdomen is soft.     Tenderness: Nondistended  Musculoskeletal:        General: No swelling or erythema noted in peripheral joints, bilateral pedal edema   Skin:    Comments: No obvious rashes. RT arm PICC+  Neurological:     General: able to squeeze hands when asked, mouthing some words, no movement in lower extremities    Pertinent Microbiology Results for orders placed or performed during the hospital encounter of 02/14/22  Urine Culture     Status: Abnormal   Collection Time: 02/14/22 10:40 AM   Specimen: Urine, Clean Catch  Result Value Ref Range Status   Specimen Description   Final    URINE, CLEAN CATCH Performed at  [facility], [address] [place], [place] [address]    Special Requests   Final    NONE Performed at [facility], [address] [place], [place] [address]    Culture >=100,000 COLONIES/mL ESCHERICHIA COLI (A)  Final   Report Status 02/16/2022 FINAL  Final   Organism ID, Bacteria ESCHERICHIA COLI (A)  Final      Susceptibility   Escherichia coli - MIC*    AMPICILLIN >=32 RESISTANT Resistant     CEFAZOLIN <=4 SENSITIVE Sensitive     CEFEPIME <=0.12 SENSITIVE Sensitive     CEFTRIAXONE <=0.25 SENSITIVE Sensitive     CIPROFLOXACIN <=0.25 SENSITIVE Sensitive     GENTAMICIN <=1 SENSITIVE Sensitive     IMIPENEM <=0.25 SENSITIVE Sensitive     NITROFURANTOIN <=16 SENSITIVE Sensitive     TRIMETH/SULFA <=20 SENSITIVE Sensitive     AMPICILLIN/SULBACTAM >=32 RESISTANT Resistant     PIP/TAZO <=4 SENSITIVE Sensitive     * >=100,000 COLONIES/mL ESCHERICHIA COLI  Culture, blood (Routine X 2) w Reflex to ID Panel     Status: None   Collection Time: 02/15/22 11:11 AM   Specimen: BLOOD  Result Value Ref Range Status   Specimen Description   Final    BLOOD BLOOD RIGHT HAND Performed at [facility] [address], [place], [place] [address]    Special Requests   Final    BOTTLES DRAWN AEROBIC AND ANAEROBIC Blood Culture adequate volume Performed at [facility] [address], [place], [place] [address]    Culture   Final    NO GROWTH 5 DAYS Performed at [facility], [place] [address], [place], [place] [address]    Report Status 02/20/2022 FINAL  Final  Culture, blood (Routine X 2) w Reflex to ID Panel     Status: None   Collection Time: 02/15/22 11:21 AM   Specimen: BLOOD  Result Value Ref Range Status   Specimen Description   Final    BLOOD BLOOD LEFT HAND Performed at [facility] [address], [place], [place] [address]    Special Requests   Final    BOTTLES DRAWN AEROBIC  AND ANAEROBIC Blood Culture adequate volume Performed at [facility] [address], [place], [place] [address]    Culture   Final    NO GROWTH 5 DAYS Performed at [facility], [place] [address] [place], [place] [address]    Report Status 02/20/2022 FINAL  Final  MRSA Next Gen by PCR, Nasal     Status: None   Collection Time: 02/16/22  3:06 PM   Specimen: Nasal Mucosa; Nasal Swab  Result  Value Ref Range Status   MRSA by PCR Next Gen NOT DETECTED NOT DETECTED Final    Comment: (NOTE) The GeneXpert MRSA Assay (FDA approved for NASAL specimens only), is one component of a comprehensive MRSA colonization surveillance program. It is not intended to diagnose MRSA infection nor to guide or monitor treatment for MRSA infections. Test performance is not FDA approved in patients less than 53 years old. Performed at [facility], [place] [address], [place], [place] [address]   CSF culture w Gram Stain     Status: None   Collection Time: 02/18/22  1:45 PM   Specimen: PATH Cytology CSF; Cerebrospinal Fluid  Result Value Ref Range Status   Specimen Description   Final    CSF Performed at [facility] [address] [place], [place] [address]    Special Requests   Final    NONE Performed at [facility], [place] [address], [place], [place] [address]    Gram Stain   Final    WBC PRESENT,BOTH PMN AND MONONUCLEAR CYTOSPIN SMEAR RESULT CALLED TO, READ BACK BY AND VERIFIED WITH: Tyson Babinski RN AT 0973 02/18/22 BY [place],K CORRECTED RESULTS GRAM NEGATIVE RODS PREVIOUSLY REPORTED AS: GRAM POSITIVE COCCI CORRECTED RESULTS CALLED TO: PHARMD M.TUCKER AT 1133 ON 02/20/2022 BY T.SAAD.    Culture   Final    MODERATE ESCHERICHIA COLI CRITICAL RESULT CALLED TO, READ BACK BY AND VERIFIED WITH: RN M.SPENS AT 1254 ON 02/19/2022 BY T.SAAD. Performed at [facility], [place] [address], [place], [place] [address]    Report  Status 02/20/2022 FINAL  Final   Organism ID, Bacteria ESCHERICHIA COLI  Final      Susceptibility   Escherichia coli - MIC*    AMPICILLIN >=32 RESISTANT Resistant     CEFAZOLIN <=4 SENSITIVE Sensitive     CEFEPIME <=0.12 SENSITIVE Sensitive     CEFTAZIDIME <=1 SENSITIVE Sensitive     CEFTRIAXONE <=0.25 SENSITIVE Sensitive     CIPROFLOXACIN <=0.25 SENSITIVE Sensitive     GENTAMICIN <=1 SENSITIVE Sensitive     IMIPENEM <=0.25 SENSITIVE Sensitive     TRIMETH/SULFA <=20 SENSITIVE Sensitive     AMPICILLIN/SULBACTAM 16 INTERMEDIATE Intermediate     PIP/TAZO <=4 SENSITIVE Sensitive     * MODERATE ESCHERICHIA COLI  MRSA Next Gen by PCR, Nasal     Status: None   Collection Time: 02/24/22 12:12 AM   Specimen: Nasal Mucosa; Nasal Swab  Result Value Ref Range Status   MRSA by PCR Next Gen NOT DETECTED NOT DETECTED Final    Comment: (NOTE) The GeneXpert MRSA Assay (FDA approved for NASAL specimens only), is one component of a comprehensive MRSA colonization surveillance program. It is not intended to diagnose MRSA infection nor to guide or monitor treatment for MRSA infections. Test performance is not FDA approved in patients less than 64 years old. Performed at [facility], [place] [address], [place],  [address]   Aerobic/Anaerobic Culture w Gram Stain (surgical/deep wound)     Status: None   Collection Time: 02/24/22 11:00 AM   Specimen: Abscess  Result Value Ref Range Status   Specimen Description ABSCESS  Final   Special Requests NONE  Final   Gram Stain   Final    ABUNDANT WBC PRESENT, PREDOMINANTLY PMN NO ORGANISMS SEEN    Culture   Final    RARE ESCHERICHIA COLI NO ANAEROBES ISOLATED Performed at [facility], [place],  [place] [address]    Report Status 03/01/2022 FINAL  Final   Organism ID, Bacteria ESCHERICHIA COLI  Final      Susceptibility   Escherichia coli - MIC*    AMPICILLIN >=32 RESISTANT Resistant     CEFAZOLIN <=4 SENSITIVE  Sensitive     CEFEPIME <=0.12 SENSITIVE Sensitive     CEFTAZIDIME <=1 SENSITIVE Sensitive     CEFTRIAXONE <=0.25 SENSITIVE Sensitive     CIPROFLOXACIN <=0.25 SENSITIVE Sensitive     GENTAMICIN <=1 SENSITIVE Sensitive     IMIPENEM <=0.25 SENSITIVE Sensitive     TRIMETH/SULFA <=20 SENSITIVE Sensitive     AMPICILLIN/SULBACTAM 16 INTERMEDIATE Intermediate     PIP/TAZO <=4 SENSITIVE Sensitive     * RARE ESCHERICHIA COLI  Culture, blood (Routine X 2) w Reflex to ID Panel     Status: None (Preliminary result)   Collection Time: 02/25/22 11:08 AM   Specimen: BLOOD  Result Value Ref Range Status   Specimen Description BLOOD BLOOD LEFT ARM  Final   Special Requests   Final    AEROBIC BOTTLE ONLY Blood Culture results may not be optimal due to an inadequate volume of blood received in culture bottles   Culture   Final    NO GROWTH 4 DAYS Performed at [facility] [address], [place], [place] [address]    Report Status PENDING  Incomplete  Culture, blood (Routine X 2) w Reflex to ID Panel     Status: None (Preliminary result)   Collection Time: 02/25/22 11:08 AM   Specimen: BLOOD  Result Value Ref Range Status   Specimen Description BLOOD BLOOD RIGHT ARM  Final   Special Requests   Final    BOTTLES DRAWN AEROBIC ONLY Blood Culture results may not be optimal due to an inadequate volume of blood received in culture bottles   Culture   Final    NO GROWTH 4 DAYS Performed at [facility], [place] [address], [place], [place] [address]    Report Status PENDING  Incomplete  Culture, Respiratory w Gram Stain     Status: None   Collection Time: 02/26/22 11:44 AM   Specimen: Tracheal Aspirate; Respiratory  Result Value Ref Range Status   Specimen Description TRACHEAL ASPIRATE  Final   Special Requests NONE  Final   Gram Stain   Final    MODERATE WBC PRESENT,BOTH PMN AND MONONUCLEAR NO ORGANISMS SEEN Performed at [facility], [address], [place], [place] [address]     Culture RARE CANDIDA ALBICANS  Final   Report Status 03/01/2022 FINAL  Final  Aerobic/Anaerobic Culture w Gram Stain (surgical/deep wound)     Status: None (Preliminary result)   Collection Time: 03/01/22  6:49 PM   Specimen: PATH Other; Tissue  Result Value Ref Range Status   Specimen Description ABSCESS  Final   Special Requests THORACIC EPIDURAL ABSCESS  Final   Gram Stain NO WBC SEEN NO ORGANISMS SEEN   Final   Culture   Final    NO GROWTH < 12 HOURS Performed at [facility], [place] [address] [place],  [address]    Report Status PENDING  Incomplete    Pertinent Lab.    Latest Ref Rng & Units 03/01/2022    5:52 AM 02/28/2022    5:34 AM 02/27/2022    4:31 AM  CBC  WBC 4.0 - 10.5 K/uL 7.1   8.3   7.3    Hemoglobin 12.0 - 15.0  g/dL 9.8   9.0   9.1    Hematocrit 36.0 - 46.0 % 30.4   29.2   29.2    Platelets 150 - 400 K/uL 210   204   171        Latest Ref Rng & Units 03/02/2022    5:03 AM 03/01/2022    9:19 PM 03/01/2022    4:20 PM  CMP  Sodium 135 - 145 mmol/L 150   147   147       Pertinent Imaging today Plain films and CT images have been personally visualized and interpreted; radiology reports have been reviewed. Decision misrking incorporated into the Impression / Recommendations.  DG Thoracic Spine 2 View  Result Date: 03/01/2022 CLINICAL DATA:  OR. EXAM: THORACIC SPINE 2 VIEWS COMPARISON:  Thoracic spine MRI 02/27/2022 FINDINGS: Single cross-table lateral view of the thoracolumbar spine. Linear instrument is seen overlying the posterior elements at the T11 level. No fracture or malalignment. IMPRESSION: As above. Electronically Signed   By: Ronney Asters M.D.   On: 03/01/2022 20:52     I spent approx 55 minutes for this patient encounter including review of prior medical records, coordination of care with primary/other specialist with greater than 50% of time being face to face/counseling and discussing diagnostics/treatment plan with the  patient/family.  Electronically signed by:   Rosiland Oz, MD Infectious Disease Physician [facility] Pager: [phone]

## 2022-03-02 NOTE — Evaluation (Signed)
Physical Therapy Evaluation Patient Details Name: Gabrielle Vincent MRN: [id number] DOB: 16-Apr-1966 Today's Date: 03/02/2022  History of Present Illness  56 y.o. female presents to [facility] hospital on 02/14/2022 with complaints of severe back pain associated with hematuria and dysuria. Pt admitted for management of UTI and AKI. MRI demonstrates small herniated nucleus pulposus at L3-L4 on the left side. 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 Pt intubated 02/16/2022. Cultures positive for meningitis. MRI 02/23/2022 demonstrates diffuse epidural abscess with cord compression and likely signal change. Pt underwent C3-T1 decompressive laminectomy with epidural abscess evaucation along with C3-7 fusion on 5/13. MRI 5/16 demonstrates Extensive thoracic epidural abscess. 5/18- Pt underwent Thoracolumbar laminectomy and decompression of epidural abscess. Extubated 5/19. PMH includes obesity, HLD, diverticulosis.  Clinical Impression  Pt presents to PT with deficits in strength, power, functional mobility, endurance. Pt with flaccid lower extremities and significant weakness in BUE at this time. Pt follows commands well, participating in exam of uppers. Pt only demonstrates a withdrawal to pain in Lower extremities. PT defers functional mobility due to recent extubation and pt reports of significant pain with PROM of extremities. Pt will benefit from progressive functional mobility in an effort to reduce caregiver burden and improve strength. PT with recommendation for AIR at this time however next session will allows for a better assessment of the most appropriate discharge destination.       Recommendations for follow up therapy are one component of a multi-disciplinary discharge planning process, led by the attending physician.  Recommendations may be updated based on patient status, additional functional criteria and insurance authorization.  Follow Up Recommendations Acute inpatient  rehab (3hours/day) (will wait for AIR consult pending pt's tolerance next session)    Assistance Recommended at Discharge Frequent or constant Supervision/Assistance  Patient can return home with the following  Two people to help with walking and/or transfers;Two people to help with bathing/dressing/bathroom;Assistance with cooking/housework;Assistance with feeding;Assist for transportation;Help with stairs or ramp for entrance;Direct supervision/assist for medications management;Direct supervision/assist for financial management    Equipment Recommendations Wheelchair (measurements PT);Wheelchair cushion (measurements PT);Hospital bed (hoyer lift)  Recommendations for Other Services       Functional Status Assessment Patient has had a recent decline in their functional status and demonstrates the ability to make significant improvements in function in a reasonable and predictable amount of time.     Precautions / Restrictions Precautions Precautions: Fall;Back Precaution Booklet Issued: No Precaution Comments: plan to review precautions next session Required Braces or Orthoses:  (no brace needed per orders) Restrictions Weight Bearing Restrictions: No      Mobility  Bed Mobility Overal bed mobility:  (deferred due to recent extubation and increased RR with bed-level extremity movement)             General bed mobility comments: PT elevates HOB which pt tolerates well    Transfers                        Ambulation/Gait                  Stairs            Wheelchair Mobility    Modified Rankin (Stroke Patients Only)       Balance Overall balance assessment:  (not assessed, pt with full back support during session)  Pertinent Vitals/Pain Pain Assessment Pain Assessment: Faces Faces Pain Scale: Hurts whole lot Pain Location: back, BLE with PROM Pain Descriptors / Indicators:  Grimacing Pain Intervention(s): Monitored during session    Home Living Family/patient expects to be discharged to:: Private residence Living Arrangements: Other relatives (brother) Available Help at Discharge: Family;Available PRN/intermittently Type of Home: House Home Access: Stairs to enter Entrance Stairs-Rails: Psychiatric nurse of Steps: 4   Home Layout: One level Home Equipment: Hand held shower head      Prior Function Prior Level of Function : Independent/Modified Independent;Working/employed;Driving             Mobility Comments: works as a Architect Extremity Assessment Upper Extremity Assessment: RUE deficits/detail;LUE deficits/detail RUE Deficits / Details: PROM WFL, pt is grossly 2/5 at this time LUE Deficits / Details: PROM WFL, pt is grossly 2/5 at this time    Lower Extremity Assessment Lower Extremity Assessment: RLE deficits/detail;LLE deficits/detail RLE Deficits / Details: flaccid, withdrawal to pain, PROM WFL LLE Deficits / Details: flaccid, withdrawal to pain, PROM appears [facility] however pain limiting ankle DF and knee/hip flexion    Cervical / Trunk Assessment Cervical / Trunk Assessment: Back Surgery  Communication   Communication: Other (comment) (very soft spoken, recent extubation)  Cognition Arousal/Alertness: Awake/alert Behavior During Therapy: Restless (PT providing cues to relax and rest) Overall Cognitive Status: Difficult to assess                                 General Comments: pt is oriented to person/place/situation, follows commands consistently with UEs        General Comments General comments (skin integrity, edema, etc.): VSS, pt on 4L [place] during session, wheezing noted by PT. Pt is able to cough twice, once with production of mucous. PT notes edema in all extremities, education provided to elevate when able     Exercises Other Exercises Other Exercises: PT instructs family in gentle PROM of ankles and knees as tolerated   Assessment/Plan    PT Assessment Patient needs continued PT services  PT Problem List Decreased strength;Decreased activity tolerance;Decreased balance;Decreased mobility;Decreased knowledge of use of DME;Cardiopulmonary status limiting activity;Pain       PT Treatment Interventions DME instruction;Gait training;Functional mobility training;Therapeutic activities;Balance training;Neuromuscular re-education;Cognitive remediation;Therapeutic exercise;Patient/family education;Wheelchair mobility training    PT Goals (Current goals can be found in the Care Plan section)  Acute Rehab PT Goals Patient Stated Goal: to gain strength in extremities and move better PT Goal Formulation: With patient Time For Goal Achievement: 03/16/22 Potential to Achieve Goals: Fair    Frequency Min 4X/week     Co-evaluation               AM-PAC PT "6 Clicks" Mobility  Outcome Measure Help needed turning from your back to your side while in a flat bed without using bedrails?: Total Help needed moving from lying on your back to sitting on the side of a flat bed without using bedrails?: Total Help needed moving to and from a bed to a chair (including a wheelchair)?: Total Help needed standing up from a chair using your arms (e.g., wheelchair or bedside chair)?: Total Help needed to walk in hospital room?: Total Help needed climbing 3-5 steps with a railing? : Total 6 Click Score: 6    End of  Session Equipment Utilized During Treatment: Oxygen Activity Tolerance: Patient limited by pain Patient left: in bed;with call bell/phone within reach;with bed alarm set;with family/visitor present Nurse Communication: Mobility status;Need for lift equipment PT Visit Diagnosis: Other abnormalities of gait and mobility (R26.89);Other symptoms and signs involving the nervous system  (R29.898);Pain Pain - part of body:  (back, LLE)    Time: 7366-8159 PT Time Calculation (min) (ACUTE ONLY): 35 min   Charges:   PT Evaluation $PT Eval High Complexity: 1 High          [name], PT, DPT Acute Rehabilitation Pager: [phone] [place] 03/02/2022, 5:15 PM

## 2022-03-02 NOTE — Procedures (Signed)
Extubation Procedure Note  Patient Details:   Name: Gabrielle Vincent DOB: [birth year] MRN: [id number]   Airway Documentation:    Vent end date: 03/02/22 Vent end time: 1500   Evaluation  O2 sats: stable throughout Complications: No apparent complications Patient did tolerate procedure well. Bilateral Breath Sounds: Clear, Diminished   Yes  Pt extubated to [place] with RN and family at bedside. Positive cuff leak noted and pt is tolerating well. RT will monitor.  Cathie Olden 03/02/2022, 3:06 PM

## 2022-03-02 NOTE — Progress Notes (Signed)
Patient ID: Gabrielle Vincent, female   DOB: [birth year], 56 y.o.   MRN: [id number] Patient is awake and alert mouthing some words She has trace movement in the brachioradialis and wrist extensor on the left graded at 1 out of 5 No movement in the lower extremities is noted Dressing is dry and clean Drain with 10 cc output since insertion I have advised the nurse to remove this We will continue to follow along but I do not plan any further imaging at this time continue supportive care in this critically ill individual.  Discussed her care with critical care NP

## 2022-03-02 NOTE — Progress Notes (Signed)
Verbal order from Dr Ellene Route to remove patient's thoracic drain. Drain removed and gauze placed. No complications. 20 cc sanguinous drainage in hemovac.    Montez Hageman RN

## 2022-03-02 NOTE — Progress Notes (Signed)
[facility] Progress Note Patient Name: Gabrielle Vincent DOB: [birth year] MRN: [id number]   Date of Service  03/02/2022  HPI/Events of Note  Fever - Tylenol ordered per tube. NGT pulled. Patient with poor cough and gag. Not able to take PO safely.   eICU Interventions  Plan: Replace NGT.     Intervention Category Major Interventions: Other:  Lysle Dingwall 03/02/2022, 9:41 PM

## 2022-03-02 NOTE — Progress Notes (Signed)
NAME:  Gabrielle Vincent, MRN:  [id number], DOB:  07-25-1966, LOS: 47 ADMISSION DATE:  02/14/2022, CONSULTATION DATE:  5/5 REFERRING MD:  Rai, CHIEF COMPLAINT:  acute encephalopathy and sepsis    History of Present Illness:  56 year old female who was admitted on 5/3 with chief complaint of severe low back pain radiating down her left leg.  Also had associated hematuria and dysuria.  She had no weakness although low did say pain impacted her ability to walk.  Also noted poor p.o. intake, decreased urination as well as urinary retention, hematuria, dysuria, and some nausea.   CT scan was negative for obstructive uropathy did show fatty liver infiltrates, there is cholelithiasis but no acute cholecystitis there is mild circumferential thickening of the distal esophagus global enlargement of the uterus with thickened appearance of the endometrial stripe with radiology recommending nonemergent pelvic ultrasound.  Diagnostic evaluation notable for new AKI with serum creatinine 3.64, acute transaminitis leukocytosis, and hyponatremia of 127 she was admitted for further evaluation, cultures were sent, and she was started on IV ceftriaxone.  On 5/3 patient was noted to be more confused, speech was slurred , Working diagnosis was #1 urinary tract infection with resultant sepsis and also low back pain for which neurosurgery was consulted as MRI finding showed acute herniated L3-L4 disc with left lumbar radiculopathy.  It was felt that pain control via lumbar injection may help in that this could be treated conservatively.  On 5/4 patient becoming intermittently agitated then lethargic, because of this a CT of head was obtained this was severely limited due to motion degraded meant but there was concern about hypodense area in the right basal ganglia section raising concern for acute infarct.  On 5/5 a rapid response was called the patient was severely agitated, pulled out IVs, pull out Foley catheter  Later that  afternoon mental status continued to worsen.  Neurology consult was obtained in addition to this infectious disease was consulted with urine growing E. coli and narrowed antibiotics to cefazolin stopped vancomycin and recommended supportive care because her mental status continued to decline, she had severe metabolic derangements, and we were unable to provide medical care on the [place] ward she was transferred to the ICU for higher level of care.  On initial arrival she was agitated requiring several nurses to hold her down, tachypneic tachycardic would not follow commands had marked increased work of breathing.  An intraosseous line was placed in the right lower extremity, she was intubated for airway protection to facilitate further MRI imaging, and a right IJ triple-lumen catheter was placed  Pertinent  Medical History  Class II obesity, hepatic steatosis, seasonal allergies, varicose veins diverticulosis, HL   Significant Hospital Events: Including procedures, antibiotic start and stop dates in addition to other pertinent events   5/3 admitted with back pain and urinary tract infection , Further complicated by AKI hyponatremia and multiple metabolic derangements.   CT imaging for renal stones showed no acute abdominal pelvic findings there was no obstructive uropathy there was severe fatty liver disease there was cholelithiasis but no cholecystitis there is colonic diverticulosis but no diverticulitis, mild circumferential thickening of the esophagus globular enlargement of the uterus with radiology recommending nonemergent pelvic ultrasound found to have uterine fibroid abdominal Ultrasound showed fatty liver but was epididymides negative.   MRI of the lumbar spine showed small left subarticular disc extrusion with inferior migration at L3-L4 correlating with radiculopathy.  She was started on ceftriaxone cultures were sent 5/4 increased confusion CT  brain obtained raising concern for possible acute  infarct in the right basal ganglia 5/5 progressive encephalopathy , Worsening leukocytosis, hypernatremia, hyperchloremia, slowly improving renal function, slowly improving procalcitonin, moved to ICU due to delirium, intubated for airway protection and to facilitate MRI imaging right IJ central line placed due to limited IV access.  Seen by neurology, also seen by infectious disease with ceftriaxone changed to cefazolin 5/6 antibiotics changed to meningitis coverage given MRI findings of fluid in the ventricles.  Image guided LP ordered 5/8 LP done with purulent CSF, studies consistent with bacterial meningitis, culture growing gm + cocci 5/10 significant rise in sodium with massive urine output of greater than 7 L leading to concern for development of central DI.  DDAVP and volume resuscitation started 5/11 hypernatremia slowly downtrending, continue DDAVP and IV resuscitation per nephrology 5/12 Sodium downtrended to 147 this am 5/13 to OR for posterior cervical decompressive laminectomy and medial facetectomy C3-T1 for evacuation of epirudal abscess, posterior cervical arthrodesis C3-C7, segmental fixation C3-7 5/18 OR for thoacolumbar laminectomy and decompression of epidural abscess from T-L spine  Interim History / Subjective:  Tmax 102.4, but improved fevers overnight. This morning he denies complaints.   Objective   Blood pressure (!) 123/57, pulse 74, temperature 100 F (37.8 C), temperature source Axillary, resp. rate (!) 24, height '5\' 4"'$  (1.626 m), weight 94.5 kg, SpO2 97 %.    Vent Mode: PSV;CPAP FiO2 (%):  [30 %] 30 % Set Rate:  [24 bmp] 24 bmp Vt Set:  [440 mL] 440 mL PEEP:  [5 cmH20] 5 cmH20 Pressure Support:  [8 cmH20] 8 cmH20 Plateau Pressure:  [15 cmH20-16 cmH20] 16 cmH20   Intake/Output Summary (Last 24 hours) at 03/02/2022 0848 Last data filed at 03/02/2022 0824 Gross per 24 hour  Intake 2518.77 ml  Output 6030 ml  Net -3511.23 ml    Filed Weights   02/27/22 0453  02/28/22 0315 03/02/22 0500  Weight: 103.3 kg 102.6 kg 94.5 kg   Physical exam: General: critically ill appearing woman lying in bed in NAD Neuro: RASS -2, not moving LE, weak hand grip, 1/5 elbow flexion. HEENT: [place]/AT, eyes anicteric, dry lips improving.  Cardiovascular: S1S2, RRR Lungs: synchronous with MV, rhonchi improved with suctioning, thin tan secretions Abdomen: soft, NT Musculoskeletal: TED hose LE, +LE edema, no cyanosis Skin: warm, dry, no rashes. +Pallor. GU: foley  Resp culture> rare candida Blood cultures 5/14> NG x 4 days Abscess> E. Coli  epidural abscess 5/18> NGTD  BG 120-240s Na+ 150  Assessment & Plan:  Principal Problem:   Abscess in epidural space of cervical spine Active Problems:   Sepsis secondary to UTI (HCC)   Hyponatremia   Hypokalemia   AKI (acute kidney injury) (HCC)   Abnormal LFTs   Class II obesity   Hepatic steatosis   Cholelithiasis   Esophageal thickening   Acute low back pain   Encephalopathy acute   Acute respiratory failure (HCC)   Radiculopathy of lumbar region   E. coli UTI   Meningitis   Bacterial meningitis   Diabetes insipidus ([place])   H/O excision of lamina of cervical vertebra for decompression of spinal cord   Septic shock secondary to E. Coli- UTI , spinal abscess, and meningitis Acute bacterial meningitis with E. 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, improving with DDAVP -weaning DDAVP 4 > 2 times daily; Na+  monitoring Q8h  AKI NAGMA - resolved  Anasarca with hypervolemia  +7L net -increase FWF to 200cc Q4h -DDAVP weaning, con't Na+ monitoring -hold lasix -TED hose to remain in place  Acute hypoxic respiratory failure requiring MV Difficult airway-- anterior and had glottic edema -needs cuff leak test prior to extubation attempts -LTVV -daily SAT & SBT -VAP prevention protocol -PAD protocol for sedation -wean FiO2 to maintain SpO2 >90% -ongoing discussions regarding potential need for trach with family  Shock liver superimposed on known fatty liver disease - improved -no additional monitoring needed  Anemia of critical illness -transfuse for Hb <7 or hemodynamically significant bleeding -CBC this morning  Hyperglycemia, uncontrolled DM. A1c 8.9. -increase long acting insulin- 20 units daily -SSI PRN -TF coverage -goal BG 140-180  At risk for malnutrition -TF via cortrak  RUL nodule, concerning for primary lung cancer - Will need further workup if survives current illness/acute issues. Not currently a candidate for biopsy or treatment due to critical illness.  Best Practice (right click and "Reselect all SmartList Selections" daily)   Diet/type: TF DVT prophylaxis: prophylactic heparin  GI prophylaxis: PPI Lines: PICC placed 5/15 Foley:  Yes, and it is still needed- DI Code Status:  full code Last date of multidisciplinary goals of care discussion: 5/11, IPAL note dictated 5/10 Updated sister via phone 5/17  Critical care time:  45 min.    Julian Hy, DO 03/02/22 9:14 AM [facility]

## 2022-03-03 DIAGNOSIS — R1310 Dysphagia, unspecified: Secondary | ICD-10-CM

## 2022-03-03 DIAGNOSIS — E870 Hyperosmolality and hypernatremia: Secondary | ICD-10-CM

## 2022-03-03 DIAGNOSIS — J9601 Acute respiratory failure with hypoxia: Secondary | ICD-10-CM | POA: Diagnosis not present

## 2022-03-03 DIAGNOSIS — G8220 Paraplegia, unspecified: Secondary | ICD-10-CM

## 2022-03-03 DIAGNOSIS — G061 Intraspinal abscess and granuloma: Secondary | ICD-10-CM | POA: Diagnosis not present

## 2022-03-03 LAB — CBC WITH DIFFERENTIAL/PLATELET
Abs Immature Granulocytes: 0.05 10*3/uL (ref 0.00–0.07)
Basophils Absolute: 0.1 10*3/uL (ref 0.0–0.1)
Basophils Relative: 1 %
Eosinophils Absolute: 0 10*3/uL (ref 0.0–0.5)
Eosinophils Relative: 0 %
HCT: 33.6 % — ABNORMAL LOW (ref 36.0–46.0)
Hemoglobin: 10.2 g/dL — ABNORMAL LOW (ref 12.0–15.0)
Immature Granulocytes: 1 %
Lymphocytes Relative: 20 %
Lymphs Abs: 1.8 10*3/uL (ref 0.7–4.0)
MCH: 28.7 pg (ref 26.0–34.0)
MCHC: 30.4 g/dL (ref 30.0–36.0)
MCV: 94.6 fL (ref 80.0–100.0)
Monocytes Absolute: 1 10*3/uL (ref 0.1–1.0)
Monocytes Relative: 11 %
Neutro Abs: 5.9 10*3/uL (ref 1.7–7.7)
Neutrophils Relative %: 67 %
Platelets: 244 10*3/uL (ref 150–400)
RBC: 3.55 MIL/uL — ABNORMAL LOW (ref 3.87–5.11)
RDW: 16.5 % — ABNORMAL HIGH (ref 11.5–15.5)
WBC: 8.8 10*3/uL (ref 4.0–10.5)
nRBC: 0.3 % — ABNORMAL HIGH (ref 0.0–0.2)

## 2022-03-03 LAB — SODIUM
Sodium: 160 mmol/L — ABNORMAL HIGH (ref 135–145)
Sodium: 160 mmol/L — ABNORMAL HIGH (ref 135–145)

## 2022-03-03 LAB — GLUCOSE, CAPILLARY
Glucose-Capillary: 162 mg/dL — ABNORMAL HIGH (ref 70–99)
Glucose-Capillary: 175 mg/dL — ABNORMAL HIGH (ref 70–99)
Glucose-Capillary: 200 mg/dL — ABNORMAL HIGH (ref 70–99)
Glucose-Capillary: 205 mg/dL — ABNORMAL HIGH (ref 70–99)
Glucose-Capillary: 208 mg/dL — ABNORMAL HIGH (ref 70–99)
Glucose-Capillary: 235 mg/dL — ABNORMAL HIGH (ref 70–99)

## 2022-03-03 MED ORDER — ALTEPLASE 2 MG IJ SOLR
2.0000 mg | Freq: Once | INTRAMUSCULAR | Status: AC
  Administered 2022-03-03: 4 mg

## 2022-03-03 MED ORDER — ALTEPLASE 2 MG IJ SOLR
INTRAMUSCULAR | Status: AC
  Filled 2022-03-03: qty 4

## 2022-03-03 MED ORDER — INSULIN GLARGINE-YFGN 100 UNIT/ML ~~LOC~~ SOLN
40.0000 [IU] | Freq: Every day | SUBCUTANEOUS | Status: DC
  Administered 2022-03-04: 40 [IU] via SUBCUTANEOUS
  Filled 2022-03-03: qty 0.4

## 2022-03-03 MED ORDER — ALTEPLASE 2 MG IJ SOLR: 2.0000 mg | Freq: Once | INTRAMUSCULAR | Status: AC

## 2022-03-03 MED ORDER — DESMOPRESSIN ACETATE 0.1 MG PO TABS
0.0500 mg | ORAL_TABLET | Freq: Four times a day (QID) | ORAL | Status: DC
Start: 2022-03-03 — End: 2022-03-07
  Administered 2022-03-03 – 2022-03-06 (×15): 0.05 mg
  Filled 2022-03-03 (×17): qty 1

## 2022-03-03 MED ORDER — INSULIN GLARGINE-YFGN 100 UNIT/ML ~~LOC~~ SOLN
20.0000 [IU] | Freq: Once | SUBCUTANEOUS | Status: AC
  Administered 2022-03-03: 20 [IU] via SUBCUTANEOUS
  Filled 2022-03-03: qty 0.2

## 2022-03-03 NOTE — Progress Notes (Signed)
OT Cancellation Note  Patient Details Name: Akanksha Bellmore MRN: [id number] DOB: 1965/12/30   Cancelled Treatment:    Reason Eval/Treat Not Completed: Other (comment). OT acknowledges need for evaluation, and OT will see as soon as schedule/staffing allows.  Merri Ray Marvens [place] 03/03/2022, 2:20 PM  Jesse Sans OTR/L Acute Rehabilitation Services Pager: [phone] Office: [phone]

## 2022-03-03 NOTE — Progress Notes (Signed)
Date and time results received: 03/03/22 "10:57 AM"  Test: NA Critical Value: 161  Name of Provider Notified: Dr. Carlis Abbott  Orders Received? Or Actions Taken?: Orders entered per Dr. Carlis Abbott

## 2022-03-03 NOTE — Progress Notes (Signed)
Inpatient Rehab Admissions Coordinator Note:   Per PT patient was screened for CIR candidacy by Tramar Brueckner Danford Bad, CCC-SLP. At this time, pt has not yet attempted transfers and is pain limited. Pt may have potential to progress to becoming a potential CIR candidate. CIR admissions team will follow to monitor progress and participation with therapies. A consult order will be placed if pt appears to be an appropriate candidate.    Gayland Curry, [place], [place] Admissions Coordinator [phone] 03/03/22 9:01 AM

## 2022-03-03 NOTE — Progress Notes (Signed)
NAME:  Gabrielle Vincent, MRN:  [id number], DOB:  Jan 02, 1966, LOS: 62 ADMISSION DATE:  02/14/2022, CONSULTATION DATE:  5/5 REFERRING MD:  Rai, CHIEF COMPLAINT:  acute encephalopathy and sepsis    History of Present Illness:  56 year old female who was admitted on 5/3 with chief complaint of severe low back pain radiating down her left leg.  Also had associated hematuria and dysuria.  She had no weakness although low did say pain impacted her ability to walk.  Also noted poor p.o. intake, decreased urination as well as urinary retention, hematuria, dysuria, and some nausea.   CT scan was negative for obstructive uropathy did show fatty liver infiltrates, there is cholelithiasis but no acute cholecystitis there is mild circumferential thickening of the distal esophagus global enlargement of the uterus with thickened appearance of the endometrial stripe with radiology recommending nonemergent pelvic ultrasound.  Diagnostic evaluation notable for new AKI with serum creatinine 3.64, acute transaminitis leukocytosis, and hyponatremia of 127 she was admitted for further evaluation, cultures were sent, and she was started on IV ceftriaxone.  On 5/3 patient was noted to be more confused, speech was slurred , Working diagnosis was #1 urinary tract infection with resultant sepsis and also low back pain for which neurosurgery was consulted as MRI finding showed acute herniated L3-L4 disc with left lumbar radiculopathy.  It was felt that pain control via lumbar injection may help in that this could be treated conservatively.  On 5/4 patient becoming intermittently agitated then lethargic, because of this a CT of head was obtained this was severely limited due to motion degraded meant but there was concern about hypodense area in the right basal ganglia section raising concern for acute infarct.  On 5/5 a rapid response was called the patient was severely agitated, pulled out IVs, pull out Foley catheter  Later that  afternoon mental status continued to worsen.  Neurology consult was obtained in addition to this infectious disease was consulted with urine growing E. coli and narrowed antibiotics to cefazolin stopped vancomycin and recommended supportive care because her mental status continued to decline, she had severe metabolic derangements, and we were unable to provide medical care on the [place] ward she was transferred to the ICU for higher level of care.  On initial arrival she was agitated requiring several nurses to hold her down, tachypneic tachycardic would not follow commands had marked increased work of breathing.  An intraosseous line was placed in the right lower extremity, she was intubated for airway protection to facilitate further MRI imaging, and a right IJ triple-lumen catheter was placed  Pertinent  Medical History  Class II obesity, hepatic steatosis, seasonal allergies, varicose veins diverticulosis, HL   Significant Hospital Events: Including procedures, antibiotic start and stop dates in addition to other pertinent events   5/3 admitted with back pain and urinary tract infection , Further complicated by AKI hyponatremia and multiple metabolic derangements.   CT imaging for renal stones showed no acute abdominal pelvic findings there was no obstructive uropathy there was severe fatty liver disease there was cholelithiasis but no cholecystitis there is colonic diverticulosis but no diverticulitis, mild circumferential thickening of the esophagus globular enlargement of the uterus with radiology recommending nonemergent pelvic ultrasound found to have uterine fibroid abdominal Ultrasound showed fatty liver but was epididymides negative.   MRI of the lumbar spine showed small left subarticular disc extrusion with inferior migration at L3-L4 correlating with radiculopathy.  She was started on ceftriaxone cultures were sent 5/4 increased confusion CT  brain obtained raising concern for possible acute  infarct in the right basal ganglia 5/5 progressive encephalopathy , Worsening leukocytosis, hypernatremia, hyperchloremia, slowly improving renal function, slowly improving procalcitonin, moved to ICU due to delirium, intubated for airway protection and to facilitate MRI imaging right IJ central line placed due to limited IV access.  Seen by neurology, also seen by infectious disease with ceftriaxone changed to cefazolin 5/6 antibiotics changed to meningitis coverage given MRI findings of fluid in the ventricles.  Image guided LP ordered 5/8 LP done with purulent CSF, studies consistent with bacterial meningitis, culture growing gm + cocci 5/10 significant rise in sodium with massive urine output of greater than 7 L leading to concern for development of central DI.  DDAVP and volume resuscitation started 5/11 hypernatremia slowly downtrending, continue DDAVP and IV resuscitation per nephrology 5/12 Sodium downtrended to 147 this am 5/13 to OR for posterior cervical decompressive laminectomy and medial facetectomy C3-T1 for evacuation of epirudal abscess, posterior cervical arthrodesis C3-C7, segmental fixation C3-7 5/18 OR for thoacolumbar laminectomy and decompression of epidural abscess from T-L spine 5/19 extubated to [place]  Interim History / Subjective:  This morning she complains of pain. Has had a lot of secretions since CPT started.   Objective   Blood pressure (!) 151/133, pulse (!) 121, temperature 98.5 F (36.9 C), temperature source Oral, resp. rate (!) 21, height '5\' 4"'$  (1.626 m), weight 94.5 kg, SpO2 98 %.    Vent Mode: PSV;CPAP FiO2 (%):  [30 %] 30 % PEEP:  [5 cmH20] 5 cmH20 Pressure Support:  [8 cmH20] 8 cmH20   Intake/Output Summary (Last 24 hours) at 03/03/2022 0715 Last data filed at 03/03/2022 0600 Gross per 24 hour  Intake 3112.61 ml  Output 3370 ml  Net -257.39 ml    Filed Weights   02/28/22 0315 03/02/22 0500 03/03/22 0424  Weight: 102.6 kg 94.5 kg 94.5 kg    Physical exam: General: ill appearing woman sitting up in bed Neuro: awake, very weak voice, comprehension intact. Weak symmetric grip strength. Able to raise forearms a few inches off the be, not able to lift upper arms. Not able to move legs. HEENT: [place]/AT, eyes anicteric Cardiovascular: S1S2, RRR Lungs: rhales bilaterally. Saturating in high 90s on 4L [place], weak cough. No accessory muscle use. Abdomen: soft, NT Musculoskeletal: no cyanosis, LE edema, TED hose. Skin: face flushed, no rashes GU: foley with yellow urine  epidural abscess 5/18> NGTD  BG 200> 170s Na+ 161 K+ 3.0 BUN 25 Cr 1.17 H/H 10.2/33.6  Assessment & Plan:  Principal Problem:   Abscess in epidural space of cervical spine Active Problems:   Sepsis secondary to UTI (HCC)   Hyponatremia   Hypokalemia   AKI (acute kidney injury) (HCC)   Abnormal LFTs   Class II obesity   Hepatic steatosis   Cholelithiasis   Esophageal thickening   Acute low back pain   Encephalopathy acute   Acute respiratory failure (HCC)   Radiculopathy of lumbar region   E. coli UTI   Meningitis   Bacterial meningitis   Diabetes insipidus ([place])   H/O excision of lamina of cervical vertebra for decompression of spinal cord   Septic shock secondary to E. Coli- UTI , spinal abscess, and meningitis Acute bacterial meningitis with E. Coli due to direct extension from epidural abscess Extensive epidural abscess extending from foramen magnum to the sacral spine s/p C3-T1 laminectomy and evacuation of abscess, s/p thoracolumbar laminectomy and decompression of epidural abcess from thoracolumbar spine on  5/18. Possible quadriparesis - 2/2 above -Appreciate ID-- con't ceftriaxone. 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  -Still needs ICU monitoring for tenuous respiratory status.  Shock liver superimposed on known fatty liver disease - improved -no additional monitoring needed  Anemia of critical illness -Transfuse for Hb <7 or hemodynamically significant bleeding -monitor  Hyperglycemia, uncontrolled DM. A1c 8.9. -increase long acting insulin- 40 units daily -SSI PRN -TF coverage 8 units Q4h; has NGT in place -goal BG 140-180  At risk for malnutrition, dysphagia -self-removed cortrak, large bore NGT placed, TF  RUL nodule, concerning for primary lung cancer - If she continues to progress she will need a chest CT to better evaluate this. Would defer until most acute issues are stable. Respiratory status is tenuous to go to radiology currently.  Best Practice (right click and "Reselect all SmartList Selections" daily)   Diet/type: TF DVT prophylaxis: prophylactic heparin  GI prophylaxis: PPI Lines: PICC placed 5/15 Foley:  Yes, and it is still needed- DI Code Status:  full code Last date of multidisciplinary goals of care discussion: 5/11, IPAL note dictated 5/10 Sisters updated bedside 5/19  Critical care time:  35 min.    Julian Hy, DO 03/03/22 3:09 PM [facility]

## 2022-03-03 NOTE — Progress Notes (Signed)
Patient ID: Gabrielle Vincent, female   DOB: 1965/11/08, 56 y.o.   MRN: [id number] BP (!) 113/48   Pulse (!) 113   Temp 98.6 F (37 C) (Oral)   Resp (!) 21   Ht '5\' 4"'$  (1.626 m)   Wt 94.5 kg   LMP  (LMP Unknown)   SpO2 96%   BMI 35.76 kg/m  Alert, mouthing words in response to question Some movement in the upper extremities No change, no new recommendations

## 2022-03-04 DIAGNOSIS — J9601 Acute respiratory failure with hypoxia: Secondary | ICD-10-CM | POA: Diagnosis not present

## 2022-03-04 DIAGNOSIS — N10 Acute tubulo-interstitial nephritis: Secondary | ICD-10-CM | POA: Diagnosis not present

## 2022-03-04 DIAGNOSIS — R6521 Severe sepsis with septic shock: Secondary | ICD-10-CM | POA: Diagnosis not present

## 2022-03-04 DIAGNOSIS — G009 Bacterial meningitis, unspecified: Secondary | ICD-10-CM | POA: Diagnosis not present

## 2022-03-04 DIAGNOSIS — R7881 Bacteremia: Secondary | ICD-10-CM | POA: Diagnosis not present

## 2022-03-04 DIAGNOSIS — A419 Sepsis, unspecified organism: Secondary | ICD-10-CM | POA: Diagnosis not present

## 2022-03-04 DIAGNOSIS — G061 Intraspinal abscess and granuloma: Secondary | ICD-10-CM | POA: Diagnosis not present

## 2022-03-04 DIAGNOSIS — B9620 Unspecified Escherichia coli [E. coli] as the cause of diseases classified elsewhere: Secondary | ICD-10-CM | POA: Diagnosis present

## 2022-03-04 LAB — GLUCOSE, CAPILLARY
Glucose-Capillary: 118 mg/dL — ABNORMAL HIGH (ref 70–99)
Glucose-Capillary: 234 mg/dL — ABNORMAL HIGH (ref 70–99)
Glucose-Capillary: 191 mg/dL — ABNORMAL HIGH (ref 70–99)
Glucose-Capillary: 198 mg/dL — ABNORMAL HIGH (ref 70–99)
Glucose-Capillary: 136 mg/dL — ABNORMAL HIGH (ref 70–99)
Glucose-Capillary: 170 mg/dL — ABNORMAL HIGH (ref 70–99)

## 2022-03-04 LAB — COMPREHENSIVE METABOLIC PANEL
ALT: 15 U/L (ref 0–44)
AST: 18 U/L (ref 15–41)
Albumin: 1.6 g/dL — ABNORMAL LOW (ref 3.5–5.0)
Alkaline Phosphatase: 90 U/L (ref 38–126)
Anion gap: 7 (ref 5–15)
BUN: 25 mg/dL — ABNORMAL HIGH (ref 6–20)
CO2: 29 mmol/L (ref 22–32)
Calcium: 8.3 mg/dL — ABNORMAL LOW (ref 8.9–10.3)
Chloride: 125 mmol/L — ABNORMAL HIGH (ref 98–111)
Creatinine, Ser: 1.17 mg/dL — ABNORMAL HIGH (ref 0.44–1.00)
GFR, Estimated: 55 mL/min — ABNORMAL LOW (ref >60)
Glucose, Bld: 173 mg/dL — ABNORMAL HIGH (ref 70–99)
Potassium: 3 mmol/L — ABNORMAL LOW (ref 3.5–5.1)
Sodium: 161 mmol/L (ref 135–145)
Total Bilirubin: 0.7 mg/dL (ref 0.3–1.2)
Total Protein: 6.3 g/dL — ABNORMAL LOW (ref 6.5–8.1)

## 2022-03-04 LAB — BASIC METABOLIC PANEL
Anion gap: 8 (ref 5–15)
BUN: 30 mg/dL — ABNORMAL HIGH (ref 6–20)
CO2: 30 mmol/L (ref 22–32)
Calcium: 7.9 mg/dL — ABNORMAL LOW (ref 8.9–10.3)
Chloride: 117 mmol/L — ABNORMAL HIGH (ref 98–111)
Creatinine, Ser: 1.13 mg/dL — ABNORMAL HIGH (ref 0.44–1.00)
GFR, Estimated: 57 mL/min — ABNORMAL LOW (ref >60)
Glucose, Bld: 171 mg/dL — ABNORMAL HIGH (ref 70–99)
Potassium: 3.3 mmol/L — ABNORMAL LOW (ref 3.5–5.1)
Sodium: 155 mmol/L — ABNORMAL HIGH (ref 135–145)

## 2022-03-04 LAB — SODIUM: Sodium: 158 mmol/L — ABNORMAL HIGH (ref 135–145)

## 2022-03-04 MED ORDER — MIDODRINE HCL 5 MG PO TABS
5.0000 mg | ORAL_TABLET | Freq: Three times a day (TID) | ORAL | Status: DC
  Administered 2022-03-04 – 2022-03-05 (×3): 5 mg
  Filled 2022-03-04 (×3): qty 1

## 2022-03-04 MED ORDER — FUROSEMIDE 10 MG/ML IJ SOLN
60.0000 mg | Freq: Once | INTRAMUSCULAR | Status: AC
  Administered 2022-03-04: 60 mg via INTRAVENOUS
  Filled 2022-03-04: qty 6

## 2022-03-04 MED ORDER — MAGNESIUM SULFATE 2 GM/50ML IV SOLN
2.0000 g | Freq: Once | INTRAVENOUS | Status: AC
  Administered 2022-03-04: 2 g via INTRAVENOUS
  Filled 2022-03-04: qty 50

## 2022-03-04 MED ORDER — INSULIN GLARGINE-YFGN 100 UNIT/ML ~~LOC~~ SOLN
30.0000 [IU] | Freq: Two times a day (BID) | SUBCUTANEOUS | Status: DC
  Administered 2022-03-04 – 2022-03-05 (×3): 30 [IU] via SUBCUTANEOUS
  Filled 2022-03-04 (×5): qty 0.3

## 2022-03-04 MED ORDER — POTASSIUM CHLORIDE 10 MEQ/100ML IV SOLN
10.0000 meq | INTRAVENOUS | Status: AC
  Administered 2022-03-04 (×4): 10 meq via INTRAVENOUS
  Filled 2022-03-04 (×4): qty 100

## 2022-03-04 MED ORDER — POTASSIUM CHLORIDE 20 MEQ PO PACK
20.0000 meq | PACK | ORAL | Status: AC
  Administered 2022-03-04 (×2): 20 meq
  Filled 2022-03-04 (×2): qty 1

## 2022-03-04 NOTE — Progress Notes (Signed)
Patient ID: Gabrielle Vincent, female   DOB: 11-Mar-1966, 56 y.o.   MRN: [id number] BP 129/64   Pulse (!) 114   Temp (!) 101.6 F (38.7 C) (Axillary)   Resp (!) 31   Ht '5\' 4"'$  (1.626 m)   Wt 94.5 kg   LMP  (LMP Unknown)   SpO2 96%   BMI 35.76 kg/m  Alert, moving upper extremities by flexing at elbow. 3/5 strength No movement lower extremities that I appreciated today Wounds are clean aNd dry Continue abx

## 2022-03-04 NOTE — Progress Notes (Signed)
Pharmacy Electrolyte Replacement  Recent Labs:  Recent Labs    03/03/22 0819  K 3.0*  CREATININE 1.17*    Low Critical Values (K </= 2.5, Phos </= 1, Mg </= 1) Present: none   Plan per protocol:  K 20 mEq per tube x2 and IV 40 mEq  Mg 2g IV x1   Benetta Spar, PharmD, BCPS, BCCP Clinical Pharmacist  Please check AMION for all [place] phone numbers After 10:00 PM, call [place] [phone]

## 2022-03-04 NOTE — Progress Notes (Signed)
NAME:  Zadaya Cuadra, MRN:  [id number], DOB:  [birth year], LOS: 31 ADMISSION DATE:  02/14/2022, CONSULTATION DATE:  5/5 REFERRING MD:  Rai, CHIEF COMPLAINT:  acute encephalopathy and sepsis    History of Present Illness:  56 year old female who was admitted on 5/3 with chief complaint of severe low back pain radiating down her left leg.  Also had associated hematuria and dysuria.  She had no weakness although low did say pain impacted her ability to walk.  Also noted poor p.o. intake, decreased urination as well as urinary retention, hematuria, dysuria, and some nausea.   CT scan was negative for obstructive uropathy did show fatty liver infiltrates, there is cholelithiasis but no acute cholecystitis there is mild circumferential thickening of the distal esophagus global enlargement of the uterus with thickened appearance of the endometrial stripe with radiology recommending nonemergent pelvic ultrasound.  Diagnostic evaluation notable for new AKI with serum creatinine 3.64, acute transaminitis leukocytosis, and hyponatremia of 127 she was admitted for further evaluation, cultures were sent, and she was started on IV ceftriaxone.  On 5/3 patient was noted to be more confused, speech was slurred , Working diagnosis was #1 urinary tract infection with resultant sepsis and also low back pain for which neurosurgery was consulted as MRI finding showed acute herniated L3-L4 disc with left lumbar radiculopathy.  It was felt that pain control via lumbar injection may help in that this could be treated conservatively.  On 5/4 patient becoming intermittently agitated then lethargic, because of this a CT of head was obtained this was severely limited due to motion degraded meant but there was concern about hypodense area in the right basal ganglia section raising concern for acute infarct.  On 5/5 a rapid response was called the patient was severely agitated, pulled out IVs, pull out Foley catheter  Later that  afternoon mental status continued to worsen.  Neurology consult was obtained in addition to this infectious disease was consulted with urine growing E. coli and narrowed antibiotics to cefazolin stopped vancomycin and recommended supportive care because her mental status continued to decline, she had severe metabolic derangements, and we were unable to provide medical care on the [place] ward she was transferred to the ICU for higher level of care.  On initial arrival she was agitated requiring several nurses to hold her down, tachypneic tachycardic would not follow commands had marked increased work of breathing.  An intraosseous line was placed in the right lower extremity, she was intubated for airway protection to facilitate further MRI imaging, and a right IJ triple-lumen catheter was placed  Pertinent  Medical History  Class II obesity, hepatic steatosis, seasonal allergies, varicose veins diverticulosis, HL   Significant Hospital Events: Including procedures, antibiotic start and stop dates in addition to other pertinent events   5/3 admitted with back pain and urinary tract infection , Further complicated by AKI hyponatremia and multiple metabolic derangements.   CT imaging for renal stones showed no acute abdominal pelvic findings there was no obstructive uropathy there was severe fatty liver disease there was cholelithiasis but no cholecystitis there is colonic diverticulosis but no diverticulitis, mild circumferential thickening of the esophagus globular enlargement of the uterus with radiology recommending nonemergent pelvic ultrasound found to have uterine fibroid abdominal Ultrasound showed fatty liver but was epididymides negative.   MRI of the lumbar spine showed small left subarticular disc extrusion with inferior migration at L3-L4 correlating with radiculopathy.  She was started on ceftriaxone cultures were sent 5/4 increased confusion CT  brain obtained raising concern for possible acute  infarct in the right basal ganglia 5/5 progressive encephalopathy , Worsening leukocytosis, hypernatremia, hyperchloremia, slowly improving renal function, slowly improving procalcitonin, moved to ICU due to delirium, intubated for airway protection and to facilitate MRI imaging right IJ central line placed due to limited IV access.  Seen by neurology, also seen by infectious disease with ceftriaxone changed to cefazolin 5/6 antibiotics changed to meningitis coverage given MRI findings of fluid in the ventricles.  Image guided LP ordered 5/8 LP done with purulent CSF, studies consistent with bacterial meningitis, culture growing gm + cocci 5/10 significant rise in sodium with massive urine output of greater than 7 L leading to concern for development of central DI.  DDAVP and volume resuscitation started 5/11 hypernatremia slowly downtrending, continue DDAVP and IV resuscitation per nephrology 5/12 Sodium downtrended to 147 this am 5/13 to OR for posterior cervical decompressive laminectomy and medial facetectomy C3-T1 for evacuation of epirudal abscess, posterior cervical arthrodesis C3-C7, segmental fixation C3-7 5/18 OR for thoacolumbar laminectomy and decompression of epidural abscess from T-L spine 5/19 extubated to [place]  Interim History / Subjective:  No acute events overnight. Less secretions when respiratory suctions today. Tmax 101.3.  Objective   Blood pressure 109/77, pulse (!) 115, temperature (!) 101.3 F (38.5 C), temperature source Axillary, resp. rate (!) 26, height '5\' 4"'$  (1.626 m), weight 94.5 kg, SpO2 95 %.        Intake/Output Summary (Last 24 hours) at 03/04/2022 1004 Last data filed at 03/04/2022 0800 Gross per 24 hour  Intake 3002.45 ml  Output 2000 ml  Net 1002.45 ml    Filed Weights   02/28/22 0315 03/02/22 0500 03/03/22 0424  Weight: 102.6 kg 94.5 kg 94.5 kg   Physical exam: General:critically ill appearing man lying in bed in NAD Neuro: awake but fatigued  appearing. Not moving legs, moving UE weakly. Very weak cough. Weak voice, trying to speak to answer some questions.  HEENT: [place]/AT, eyes anicteric Cardiovascular: S1S2, tachycardic, reg rhythm Lungs: Rhonchi bilaterally, weak cough. Tachypnea without accessory muscle use. Abdomen: obese, soft, NT Musculoskeletal: TED hose, +edema. Skin: no rashes, +facial flushing GU:  foley with yellow urine  epidural abscess 5/18> rare E. Coli, no anaerobes  BG 200> 170s Na+ 158    Assessment & Plan:  Principal Problem:   Abscess in epidural space of cervical spine Active Problems:   Sepsis secondary to UTI (HCC)   Hyponatremia   Hypokalemia   AKI (acute kidney injury) (HCC)   Abnormal LFTs   Class II obesity   Hepatic steatosis   Cholelithiasis   Esophageal thickening   Acute low back pain   Encephalopathy acute   Acute respiratory failure (HCC)   Radiculopathy of lumbar region   E. coli UTI   Meningitis   Bacterial meningitis   Diabetes insipidus ([place])   H/O excision of lamina of cervical vertebra for decompression of spinal cord   Septic shock secondary to E. Coli- UTI , spinal abscess, and meningitis Acute bacterial meningitis with E. Coli due to direct extension from epidural abscess Extensive epidural abscess extending from foramen magnum to the sacral spine s/p C3-T1 laminectomy and evacuation of abscess, s/p thoracolumbar laminectomy and decompression of epidural abcess from thoracolumbar spine on 5/18. Possible quadriparesis - 2/2 above -Con't ceftriaxone.  -appreciate NS management; sepsis has improved since laminectomy on 5/18 -vasopressors as required to maintain MAP>65, start midodrine.  Acute metabolic encephalopathy due to sepsis -PAD protocol, avoid potentially sedating  meds.  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 Remains high risk for reintubation. -NTS PRN -CPT -supplemental O2 to maintain SPO@ >90% -If reintubated will need trach-- has already been intubated about 2 weeks.   Shock liver superimposed on known fatty liver disease - improved -no additional monitoring   Anemia of critical illness -transfuse for Hb<7 or hemodynamically significant bleeding -monitor  Hyperglycemia, uncontrolled DM. A1c 8.9. -increase long acting insulin- 30 units BID -SSI PRN -TF coverage 8 units Q4h -goal BG 140-180  At risk for malnutrition, dysphagia -replace cortrak tomorrow  RUL nodule, concerning for primary lung cancer - If she continues to progress she will need a chest CT to better evaluate this.  Respiratory status remains too tenuous to go to radiology today.  Best Practice (right click and "Reselect all SmartList Selections" daily)   Diet/type: TF DVT prophylaxis: prophylactic heparin  GI prophylaxis: PPI Lines: PICC placed 5/15 Foley:  Yes, and it is still needed- DI Code Status:  full code Last date of multidisciplinary goals of care discussion: 5/11, IPAL note dictated 5/10 Sisters updated bedside 5/19  Critical care time:  38 min.    Julian Hy, DO 03/04/22 10:04 AM [facility]

## 2022-03-04 NOTE — Progress Notes (Addendum)
.                                                            [facility] Infectious Diseases Follow Up Note  Patient Identification: Patient Name: Gabrielle Vincent MRN: [id number] [place] Date: 02/14/2022  9:53 AM Age: 56 y.o.Today's Date: 03/04/2022  Reason for Visit: E. coli pyelo/bsimeningitis and epidural abscess  Principal Problem:   Abscess in epidural space of cervical spine Active Problems:   Sepsis secondary to UTI ([place])   Hyponatremia   Hypokalemia   AKI (acute kidney injury) (HCC)   Abnormal LFTs   Class II obesity   Hepatic steatosis   Cholelithiasis   Esophageal thickening   Acute low back pain   Encephalopathy acute   Acute respiratory failure (HCC)   Radiculopathy of lumbar region   E. coli UTI   Meningitis   Bacterial meningitis   Diabetes insipidus ([place])   H/O excision of lamina of cervical vertebra for decompression of spinal cord   Acute pyelonephritis   E coli bacteremia   Antibiotics:  Ceftriaxone 5/3-5/5, cefepime 5/6-5/9, ceftriaxone 5/9-c bid dosing  Vancomycin 5/5-5/8, 5/14-5/17 Ampicillin 5/6-5/8 Acyclovir 5/6-5/8  Lines/Hardwares:  5/15-c rue picc 5/15-c foley    Assessment # E. coli UTI complicated with epidural abscess throughout T L with possible extension to C spine and meningitis/associated severe diffuse spinal stenosis # L4-L5 and L5-s1 septic arthritis # Ecoli Meningitis # Metabolic encephalopathy   5/06 abx changed to ceftriaxone; mri showed fluid in ventricles/meningitis suspect 5/08 LP cx ecoli 5/13 s/p cervical decompression and instrumentation c3-c7 5/18 S/p thoracolumbar laminectomy and decompression epidural abscess from T-L spine  5/13 s/p cervical decompression and instrumentation c3-c7 5/16 tte no vegetation 5/18 S/p thoracolumbar laminectomy and decompression epidural abscess from T-L spine   Today 5/21 still fever although no leukocytosis and hemodynamics stable and mentation better. Very weak/deconditioned  Extensive  infection could continue monitoring and see what fever is doing. Possibly repeat spine imaging to see if progression abscess in the next 2-3 days if still persistent fever  Last blood cx 5/14 negative; will repeat bcx   #Acute hypoxemic respiratory failure: Status post mechanical ventilation for airway protection. May need Trach per PCCM  #Newly diagnosed DM: a1c 8.9  # AKI: Cr appears stable/improving  # 2.2 cm spiculated enhancing mass right upper lobe concerning for carcinoma- defer work up to Pulmonary     Recommendations Continue ceftriaxone 2gram iv q12h Repeat blood cx today If blood cx negative and persistent fever, would repeat mri entire spine in 2-3 days Appreciate pulm-ccm and nsg care for patient  New ID team to take over tomorrow    ______________________________________________________________________ Subjective Still febrile hds Leukocytosis had normalized On ceftriaxone meningitis dose  Reviewed micro; ecoli non-esbl  5/06 abx changed to ceftriaxone; mri showed fluid in ventricles/meningitis suspect 5/08 LP cx ecoli 5/13 s/p cervical decompression and instrumentation c3-c7 5/18 S/p thoracolumbar laminectomy and decompression epidural abscess from T-L spine  Patient extubated on 5/19  Doing overall better per nursing  Very weak  Lines: 5/15-c RUE picc 5/15-c foley catheter    Vitals BP 109/77 (BP Location: Left Arm)   Pulse (!) 115   Temp (!) 101.6 F (38.7 C) (Axillary)   Resp (!) 26   Ht '5\' 4"'$  (1.626 m)  Wt 94.5 kg   LMP  (LMP Unknown)   SpO2 95%   BMI 35.76 kg/m     Physical Exam  General/constitutional: no distress, trying to speak but couldn't make voice; tracking/interactive HEENT: Normocephalic, PER, Conj Clear, EOMI, Oropharynx clear Neck supple CV: rrr no mrg Lungs: clear to auscultation, normal respiratory effort Abd: Soft, Nontender Ext: no edema Skin: No Rash Neuro: generalized weakness; able to briefly raise both  upper extremities for 1-2 seconds; some wiggle of toes? Right more than left foot.  MSK: no peripheral joint swelling/tenderness/warmth   Pertinent Microbiology Results for orders placed or performed during the hospital encounter of 02/14/22  Urine Culture     Status: Abnormal   Collection Time: 02/14/22 10:40 AM   Specimen: Urine, Clean Catch  Result Value Ref Range Status   Specimen Description   Final    URINE, CLEAN CATCH Performed at [facility], [address] [place], [place] [address]    Special Requests   Final    NONE Performed at [facility], [address] [place], [place] [address]    Culture >=100,000 COLONIES/mL ESCHERICHIA COLI (A)  Final   Report Status 02/16/2022 FINAL  Final   Organism ID, Bacteria ESCHERICHIA COLI (A)  Final      Susceptibility   Escherichia coli - MIC*    AMPICILLIN >=32 RESISTANT Resistant     CEFAZOLIN <=4 SENSITIVE Sensitive     CEFEPIME <=0.12 SENSITIVE Sensitive     CEFTRIAXONE <=0.25 SENSITIVE Sensitive     CIPROFLOXACIN <=0.25 SENSITIVE Sensitive     GENTAMICIN <=1 SENSITIVE Sensitive     IMIPENEM <=0.25 SENSITIVE Sensitive     NITROFURANTOIN <=16 SENSITIVE Sensitive     TRIMETH/SULFA <=20 SENSITIVE Sensitive     AMPICILLIN/SULBACTAM >=32 RESISTANT Resistant     PIP/TAZO <=4 SENSITIVE Sensitive     * >=100,000 COLONIES/mL ESCHERICHIA COLI  Culture, blood (Routine X 2) w Reflex to ID Panel     Status: None   Collection Time: 02/15/22 11:11 AM   Specimen: BLOOD  Result Value Ref Range Status   Specimen Description   Final    BLOOD BLOOD RIGHT HAND Performed at [facility] [address] [place], [place] [address]    Special Requests   Final    BOTTLES DRAWN AEROBIC AND ANAEROBIC Blood Culture adequate volume Performed at [facility] [address], [place], [place] [address]    Culture   Final    NO GROWTH 5 DAYS Performed at [facility], [place] [address], [place], [place] [address]    Report Status 02/20/2022 FINAL  Final  Culture, blood (Routine X 2) w Reflex to ID Panel     Status: None   Collection Time: 02/15/22 11:21 AM   Specimen: BLOOD  Result Value Ref Range Status   Specimen Description   Final    BLOOD BLOOD LEFT HAND Performed at [facility] [address] [place], [place] [address]    Special Requests   Final    BOTTLES DRAWN AEROBIC AND ANAEROBIC Blood Culture adequate volume Performed at [facility] [address] [place], [place] [address]    Culture   Final    NO GROWTH 5 DAYS Performed at [facility], [place] [address], [place], [place] [address]    Report Status 02/20/2022 FINAL  Final  MRSA Next Gen by PCR, Nasal     Status: None  Collection Time: 02/16/22  3:06 PM   Specimen: Nasal Mucosa; Nasal Swab  Result Value Ref Range Status   MRSA by PCR Next Gen NOT DETECTED NOT DETECTED Final    Comment: (NOTE) The GeneXpert MRSA Assay (FDA approved for NASAL specimens only), is one component of a comprehensive MRSA colonization surveillance program. It is not intended to diagnose MRSA infection nor to guide or monitor treatment for MRSA infections. Test performance is not FDA approved in patients less than 76 years old. Performed at [facility], [place] [address] [place], [place] [address]   CSF culture w Gram Stain     Status: None   Collection Time: 02/18/22  1:45 PM   Specimen: PATH Cytology CSF; Cerebrospinal Fluid  Result Value Ref Range Status   Specimen Description   Final    CSF Performed at [facility] [address] [place], [place] [address]    Special Requests   Final    NONE Performed at [facility], [place] [address], [place], [place] [address]    Gram Stain   Final    WBC PRESENT,BOTH PMN AND MONONUCLEAR CYTOSPIN SMEAR RESULT CALLED TO, READ BACK BY AND VERIFIED  WITH: Tyson Babinski RN AT 5956 02/18/22 BY [place],K CORRECTED RESULTS GRAM NEGATIVE RODS PREVIOUSLY REPORTED AS: GRAM POSITIVE COCCI CORRECTED RESULTS CALLED TO: PHARMD M.TUCKER AT 1133 ON 02/20/2022 BY T.SAAD.    Culture   Final    MODERATE ESCHERICHIA COLI CRITICAL RESULT CALLED TO, READ BACK BY AND VERIFIED WITH: RN M.SPENS AT 1254 ON 02/19/2022 BY T.SAAD. Performed at [facility], [place] [address], [place], [place] [address]    Report Status 02/20/2022 FINAL  Final   Organism ID, Bacteria ESCHERICHIA COLI  Final      Susceptibility   Escherichia coli - MIC*    AMPICILLIN >=32 RESISTANT Resistant     CEFAZOLIN <=4 SENSITIVE Sensitive     CEFEPIME <=0.12 SENSITIVE Sensitive     CEFTAZIDIME <=1 SENSITIVE Sensitive     CEFTRIAXONE <=0.25 SENSITIVE Sensitive     CIPROFLOXACIN <=0.25 SENSITIVE Sensitive     GENTAMICIN <=1 SENSITIVE Sensitive     IMIPENEM <=0.25 SENSITIVE Sensitive     TRIMETH/SULFA <=20 SENSITIVE Sensitive     AMPICILLIN/SULBACTAM 16 INTERMEDIATE Intermediate     PIP/TAZO <=4 SENSITIVE Sensitive     * MODERATE ESCHERICHIA COLI  MRSA Next Gen by PCR, Nasal     Status: None   Collection Time: 02/24/22 12:12 AM   Specimen: Nasal Mucosa; Nasal Swab  Result Value Ref Range Status   MRSA by PCR Next Gen NOT DETECTED NOT DETECTED Final    Comment: (NOTE) The GeneXpert MRSA Assay (FDA approved for NASAL specimens only), is one component of a comprehensive MRSA colonization surveillance program. It is not intended to diagnose MRSA infection nor to guide or monitor treatment for MRSA infections. Test performance is not FDA approved in patients less than 28 years old. Performed at [facility], [place] [address], [place], [place] [address]   Aerobic/Anaerobic Culture w Gram Stain (surgical/deep wound)     Status: None   Collection Time: 02/24/22 11:00 AM   Specimen: Abscess  Result Value Ref Range Status   Specimen Description ABSCESS  Final   Special Requests  NONE  Final   Gram Stain   Final    ABUNDANT WBC PRESENT, PREDOMINANTLY PMN NO ORGANISMS SEEN    Culture   Final    RARE  ESCHERICHIA COLI NO ANAEROBES ISOLATED Performed at [facility], [place] [address], [place], [place] [address]    Report Status 03/01/2022 FINAL  Final   Organism ID, Bacteria ESCHERICHIA COLI  Final      Susceptibility   Escherichia coli - MIC*    AMPICILLIN >=32 RESISTANT Resistant     CEFAZOLIN <=4 SENSITIVE Sensitive     CEFEPIME <=0.12 SENSITIVE Sensitive     CEFTAZIDIME <=1 SENSITIVE Sensitive     CEFTRIAXONE <=0.25 SENSITIVE Sensitive     CIPROFLOXACIN <=0.25 SENSITIVE Sensitive     GENTAMICIN <=1 SENSITIVE Sensitive     IMIPENEM <=0.25 SENSITIVE Sensitive     TRIMETH/SULFA <=20 SENSITIVE Sensitive     AMPICILLIN/SULBACTAM 16 INTERMEDIATE Intermediate     PIP/TAZO <=4 SENSITIVE Sensitive     * RARE ESCHERICHIA COLI  Culture, blood (Routine X 2) w Reflex to ID Panel     Status: None   Collection Time: 02/25/22 11:08 AM   Specimen: BLOOD  Result Value Ref Range Status   Specimen Description BLOOD BLOOD LEFT ARM  Final   Special Requests   Final    AEROBIC BOTTLE ONLY Blood Culture results may not be optimal due to an inadequate volume of blood received in culture bottles   Culture   Final    NO GROWTH 5 DAYS Performed at [facility], [place] [address] [place], [place] [address]    Report Status 03/02/2022 FINAL  Final  Culture, blood (Routine X 2) w Reflex to ID Panel     Status: None   Collection Time: 02/25/22 11:08 AM   Specimen: BLOOD  Result Value Ref Range Status   Specimen Description BLOOD BLOOD RIGHT ARM  Final   Special Requests   Final    BOTTLES DRAWN AEROBIC ONLY Blood Culture results may not be optimal due to an inadequate volume of blood received in culture bottles   Culture   Final    NO GROWTH 5 DAYS Performed at [facility], [place] [address] [place], [place] [address]    Report Status 03/02/2022 FINAL  Final   Culture, Respiratory w Gram Stain     Status: None   Collection Time: 02/26/22 11:44 AM   Specimen: Tracheal Aspirate; Respiratory  Result Value Ref Range Status   Specimen Description TRACHEAL ASPIRATE  Final   Special Requests NONE  Final   Gram Stain   Final    MODERATE WBC PRESENT,BOTH PMN AND MONONUCLEAR NO ORGANISMS SEEN Performed at [facility], [address] [place], [place] [address]    Culture RARE CANDIDA ALBICANS  Final   Report Status 03/01/2022 FINAL  Final  Aerobic/Anaerobic Culture w Gram Stain (surgical/deep wound)     Status: None (Preliminary result)   Collection Time: 03/01/22  6:49 PM   Specimen: PATH Other; Tissue  Result Value Ref Range Status   Specimen Description ABSCESS  Final   Special Requests THORACIC EPIDURAL ABSCESS  Final   Gram Stain   Final    NO WBC SEEN NO ORGANISMS SEEN Performed at [facility], [address], [place],  [address]    Culture   Final    RARE ESCHERICHIA COLI SUSCEPTIBILITIES TO FOLLOW NO ANAEROBES ISOLATED; CULTURE IN PROGRESS FOR 5 DAYS    Report Status PENDING  Incomplete    Pertinent Lab.    Latest Ref Rng & Units 03/03/2022    8:19 AM 03/01/2022    5:52 AM 02/28/2022  5:34 AM  CBC  WBC 4.0 - 10.5 K/uL 8.8   7.1   8.3    Hemoglobin 12.0 - 15.0 g/dL 10.2   9.8   9.0    Hematocrit 36.0 - 46.0 % 33.6   30.4   29.2    Platelets 150 - 400 K/uL 244   210   204        Latest Ref Rng & Units 03/04/2022    6:57 AM 03/03/2022    5:04 PM 03/03/2022    8:19 AM  CMP  Glucose 70 - 99 mg/dL   173    BUN 6 - 20 mg/dL   25    Creatinine 0.44 - 1.00 mg/dL   1.17    Sodium 135 - 145 mmol/L 158   160   161  C  Potassium 3.5 - 5.1 mmol/L   3.0    Chloride 98 - 111 mmol/L   125    CO2 22 - 32 mmol/L   29    Calcium 8.9 - 10.3 mg/dL   8.3    Total Protein 6.5 - 8.1 g/dL   6.3    Total Bilirubin 0.3 - 1.2 mg/dL   0.7    Alkaline Phos 38 - 126 U/L   90    AST 15 - 41 U/L   18    ALT 0 - 44 U/L   15       C Corrected result    Imaging: Plain films and CT images have been personally visualized and interpreted; radiology reports have been reviewed. Decision misrking incorporated into the Impression / Recommendations.  No new imaging today Reviewed previous imaging   I spent more than 50 minute reviewing data/chart, and coordinating care and >50% direct face to face time providing counseling/discussing diagnostics/treatment plan with patient        Jabier Mutton, [place] [facility] [phone]  pager   [phone] cell 03/04/2022, 12:04 PM

## 2022-03-04 NOTE — Evaluation (Signed)
Clinical/Bedside Swallow Evaluation Patient Details  Name: Gabrielle Vincent MRN: [id number] Date of Birth: [birth year]  Today's Date: 03/04/2022 Time: SLP Start Time (ACUTE ONLY): 62 SLP Stop Time (ACUTE ONLY): 1056 SLP Time Calculation (min) (ACUTE ONLY): 13 min  Past Medical History:  Past Medical History:  Diagnosis Date   Aortic atherosclerosis ([place]) 02/14/2022   Class II obesity 02/14/2022   Diverticulosis 02/14/2022   Hepatic steatosis 02/14/2022   Hyperlipidemia 02/07/2016   Seasonal allergies    Swelling of lower extremity    bilateral   Varicose veins    right leg   Past Surgical History:  Past Surgical History:  Procedure Laterality Date   LUMBAR LAMINECTOMY/DECOMPRESSION MICRODISCECTOMY N/A 03/01/2022   Procedure: Thoracic eleven - twelve Laminectomy with drainage of epidural abscess;  Surgeon: Kristeen Miss, MD;  Location: [facility];  Service: Neurosurgery;  Laterality: N/A;   lymphnode drainage surgery     POSTERIOR CERVICAL FUSION/FORAMINOTOMY N/A 02/24/2022   Procedure: POSTERIOR CERVICAL LAMINECTOMY CERVICAL THREE-CERVICAL SEVEN WITH LATERAL MASS FUSION / FIXATION;  Surgeon: Eustace Moore, MD;  Location: [facility];  Service: Neurosurgery;  Laterality: N/A;   HPI:  56 y.o. female presents 02/14/2022 with severe back pain associated with hematuria and dysuria. Found to have UTI and  AKI. MRI demonstrated small herniated nucleus pulposus at L3-L4. 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 Cultures positive for meningitis. MRI spine demonstrates diffuse epidural abscess with cord compression and likely signal change. Pt underwent C3-T1 decompressive laminectomy with epidural abscess evaucation along with C3-7 fusion on 5/13. MRI 5/16 demonstrates extensive thoracic epidural abscess. 5/18- Pt underwent thoracolumbar laminectomy and decompression of epidural abscess. Extubated 5/19. PMH includes obesity,  HLD, diverticulosis.    Assessment / Plan / Recommendation  Clinical Impression  Pt exhbits with multifactoral causes she is not ready for food/liquids at this time including aphonia, current cognitive status, inability to cough, weak throat clear and clinical observations. Her command following was inconsistent although did protrude and lateralize tongue. Moving mouth in phrases with no phonation but breakthrough phonation during weak throat clears x 2. Pt shaking and nodding head but minimal and not reliable at this time. She requires total assist for feeding and accepted spoon size sips thin water with immediate and delayed coughing. Cough is congested and therapist able to suction small amount of mucous from oral cavity. SLP Visit Diagnosis: Dysphagia, unspecified (R13.10)    Aspiration Risk  Moderate aspiration risk;Severe aspiration risk    Diet Recommendation NPO   Medication Administration: Via alternative means    Other  Recommendations Oral Care Recommendations: Oral care QID    Recommendations for follow up therapy are one component of a multi-disciplinary discharge planning process, led by the attending physician.  Recommendations may be updated based on patient status, additional functional criteria and insurance authorization.  Follow up Recommendations  (TBD)      Assistance Recommended at Discharge Frequent or constant Supervision/Assistance  Functional Status Assessment Patient has had a recent decline in their functional status and demonstrates the ability to make significant improvements in function in a reasonable and predictable amount of time.  Frequency and Duration min 2x/week  2 weeks       Prognosis Prognosis for Safe Diet Advancement: Good Barriers to Reach Goals: Cognitive deficits;Severity of deficits      Swallow Study   General HPI: 56 y.o. female presents 02/14/2022 with severe back pain associated with hematuria and dysuria.  Found to have UTI and   AKI. MRI demonstrated small herniated nucleus pulposus at L3-L4. 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 Cultures positive for meningitis. MRI spine demonstrates diffuse epidural abscess with cord compression and likely signal change. Pt underwent C3-T1 decompressive laminectomy with epidural abscess evaucation along with C3-7 fusion on 5/13. MRI 5/16 demonstrates extensive thoracic epidural abscess. 5/18- Pt underwent thoracolumbar laminectomy and decompression of epidural abscess. Extubated 5/19. PMH includes obesity, HLD, diverticulosis. Type of Study: Bedside Swallow Evaluation Previous Swallow Assessment:  (none) Diet Prior to this Study: NPO;NG Tube (large bore NGT) Temperature Spikes Noted: Yes (101.3) Respiratory Status: Nasal cannula History of Recent Intubation: Yes Length of Intubations (days): 14 days Date extubated: 03/02/22 Behavior/Cognition: Alert;Requires cueing Oral Cavity Assessment: Dry Oral Care Completed by SLP: Yes Oral Cavity - Dentition: Adequate natural dentition Vision:  (? constant blinking) Self-Feeding Abilities: Total assist Patient Positioning: Upright in bed Baseline Vocal Quality: Aphonic Volitional Cough: Weak Volitional Swallow: Unable to elicit    Oral/Motor/Sensory Function Overall Oral Motor/Sensory Function: Mild impairment (general- will continue to assess)   Ice Chips Ice chips: Not tested   Thin Liquid Thin Liquid: Impaired Presentation: Spoon Oral Phase Impairments:  (none) Oral Phase Functional Implications:  (none) Pharyngeal  Phase Impairments: Cough - Immediate;Cough - Delayed    Nectar Thick Nectar Thick Liquid: Not tested   Honey Thick Honey Thick Liquid: Not tested   Puree Puree: Not tested   Solid     Solid: Not tested      Houston Siren 03/04/2022,11:26 AM

## 2022-03-05 ENCOUNTER — Inpatient Hospital Stay (HOSPITAL_COMMUNITY): Payer: BC Managed Care – PPO

## 2022-03-05 DIAGNOSIS — N10 Acute tubulo-interstitial nephritis: Secondary | ICD-10-CM | POA: Diagnosis not present

## 2022-03-05 DIAGNOSIS — G061 Intraspinal abscess and granuloma: Secondary | ICD-10-CM | POA: Diagnosis not present

## 2022-03-05 DIAGNOSIS — N390 Urinary tract infection, site not specified: Secondary | ICD-10-CM | POA: Diagnosis not present

## 2022-03-05 DIAGNOSIS — N179 Acute kidney failure, unspecified: Secondary | ICD-10-CM | POA: Diagnosis not present

## 2022-03-05 DIAGNOSIS — E1165 Type 2 diabetes mellitus with hyperglycemia: Secondary | ICD-10-CM | POA: Diagnosis not present

## 2022-03-05 DIAGNOSIS — A419 Sepsis, unspecified organism: Secondary | ICD-10-CM | POA: Diagnosis not present

## 2022-03-05 DIAGNOSIS — G009 Bacterial meningitis, unspecified: Secondary | ICD-10-CM | POA: Diagnosis not present

## 2022-03-05 DIAGNOSIS — R918 Other nonspecific abnormal finding of lung field: Secondary | ICD-10-CM

## 2022-03-05 LAB — GLUCOSE, CAPILLARY
Glucose-Capillary: 128 mg/dL — ABNORMAL HIGH (ref 70–99)
Glucose-Capillary: 145 mg/dL — ABNORMAL HIGH (ref 70–99)
Glucose-Capillary: 124 mg/dL — ABNORMAL HIGH (ref 70–99)
Glucose-Capillary: 97 mg/dL (ref 70–99)
Glucose-Capillary: 122 mg/dL — ABNORMAL HIGH (ref 70–99)
Glucose-Capillary: 102 mg/dL — ABNORMAL HIGH (ref 70–99)

## 2022-03-05 LAB — CBC
HCT: 27.6 % — ABNORMAL LOW (ref 36.0–46.0)
Hemoglobin: 8.3 g/dL — ABNORMAL LOW (ref 12.0–15.0)
MCH: 29.5 pg (ref 26.0–34.0)
MCHC: 30.1 g/dL (ref 30.0–36.0)
MCV: 98.2 fL (ref 80.0–100.0)
Platelets: 253 10*3/uL (ref 150–400)
RBC: 2.81 MIL/uL — ABNORMAL LOW (ref 3.87–5.11)
RDW: 16.8 % — ABNORMAL HIGH (ref 11.5–15.5)
WBC: 8.6 10*3/uL (ref 4.0–10.5)
nRBC: 0.5 % — ABNORMAL HIGH (ref 0.0–0.2)

## 2022-03-05 LAB — SODIUM
Sodium: 155 mmol/L — ABNORMAL HIGH (ref 135–145)
Sodium: 150 mmol/L — ABNORMAL HIGH (ref 135–145)

## 2022-03-05 LAB — BASIC METABOLIC PANEL
Anion gap: 6 (ref 5–15)
BUN: 31 mg/dL — ABNORMAL HIGH (ref 6–20)
CO2: 31 mmol/L (ref 22–32)
Calcium: 7.7 mg/dL — ABNORMAL LOW (ref 8.9–10.3)
Chloride: 117 mmol/L — ABNORMAL HIGH (ref 98–111)
Creatinine, Ser: 1.1 mg/dL — ABNORMAL HIGH (ref 0.44–1.00)
GFR, Estimated: 59 mL/min — ABNORMAL LOW (ref >60)
Glucose, Bld: 176 mg/dL — ABNORMAL HIGH (ref 70–99)
Potassium: 3.3 mmol/L — ABNORMAL LOW (ref 3.5–5.1)
Sodium: 154 mmol/L — ABNORMAL HIGH (ref 135–145)

## 2022-03-05 IMAGING — DX DG ABD PORTABLE 1V
1 series · 1 of 1 positions shown · non-contrast
Comparison: None Available.

CLINICAL DATA: Feeding tube placement

EXAM:
PORTABLE ABDOMEN - 1 VIEW

[abdomen]
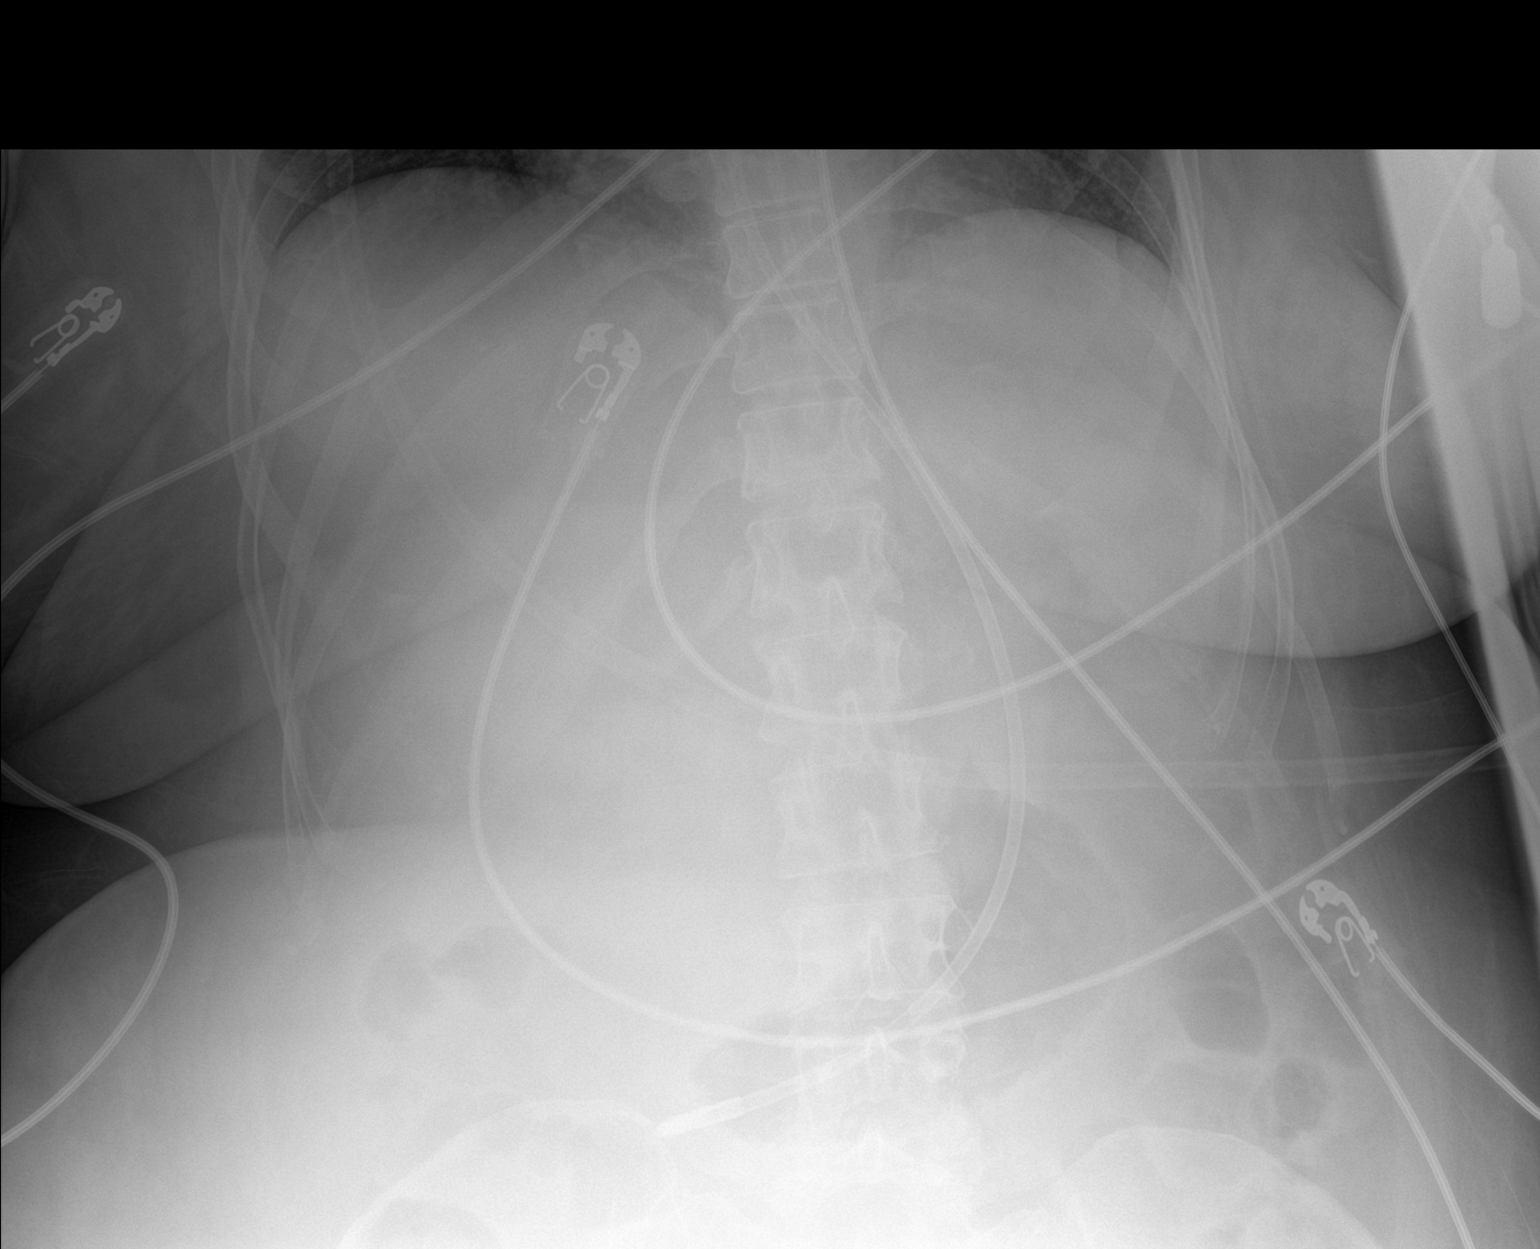

[1 of 1 positions shown; findings below may reference images not displayed]

FINDINGS: Feeding tube with tip in the distal stomach at the level of the
pylorus.
IMPRESSION: Feeding tube in distal stomach.  Stylet removed.

## 2022-03-05 MED ORDER — ONDANSETRON HCL 4 MG PO TABS: 4.0000 mg | ORAL_TABLET | Freq: Four times a day (QID) | ORAL | Status: DC | PRN

## 2022-03-05 MED ORDER — POTASSIUM CHLORIDE 20 MEQ PO PACK
20.0000 meq | PACK | ORAL | Status: AC
  Administered 2022-03-05 (×2): 20 meq
  Filled 2022-03-05 (×2): qty 1

## 2022-03-05 MED ORDER — POTASSIUM CHLORIDE 10 MEQ/50ML IV SOLN
10.0000 meq | INTRAVENOUS | Status: AC
  Administered 2022-03-05 (×4): 10 meq via INTRAVENOUS
  Filled 2022-03-05 (×3): qty 50

## 2022-03-05 MED ORDER — JEVITY 1.5 CAL/FIBER PO LIQD
1000.0000 mL | ORAL | Status: DC
  Administered 2022-03-05 – 2022-03-07 (×2): 1000 mL
  Filled 2022-03-05 (×4): qty 1000

## 2022-03-05 MED ORDER — PROSOURCE TF PO LIQD
90.0000 mL | Freq: Two times a day (BID) | ORAL | Status: DC
  Administered 2022-03-05 – 2022-03-09 (×6): 90 mL
  Filled 2022-03-05 (×6): qty 90

## 2022-03-05 MED ORDER — MIDODRINE HCL 5 MG PO TABS
10.0000 mg | ORAL_TABLET | Freq: Three times a day (TID) | ORAL | Status: DC
  Administered 2022-03-05 – 2022-03-10 (×13): 10 mg
  Filled 2022-03-05 (×13): qty 2

## 2022-03-05 MED ORDER — OXYCODONE HCL 5 MG PO TABS
5.0000 mg | ORAL_TABLET | Freq: Four times a day (QID) | ORAL | Status: DC | PRN
  Administered 2022-03-05 – 2022-03-21 (×8): 5 mg
  Filled 2022-03-05 (×7): qty 1

## 2022-03-05 MED ORDER — FREE WATER
300.0000 mL | Status: DC
  Administered 2022-03-05 – 2022-03-12 (×72): 300 mL

## 2022-03-05 NOTE — Progress Notes (Signed)
PT Cancellation Note  Patient Details Name: Gabrielle Vincent MRN: [id number] DOB: Aug 21, 1966   Cancelled Treatment:    Reason Eval/Treat Not Completed: Fatigue/lethargy limiting ability to participate. Pt tired after bathing and having cortrak placed this morning. Declining functional mobility.   [name] 03/05/2022, 10:30 AM

## 2022-03-05 NOTE — Progress Notes (Signed)
[facility]  Date of Admission:  02/14/2022           Reason for visit: Follow up on E coli epidural abscess  Current antibiotics: Ceftriaxone    ASSESSMENT:    56 y.o. female admitted with:  E coli complicated UTI with extensive epidural abscess extending from lumbar to cervical spine with associated meningitis: Status post cervical decompression and instrumentation C3-7 on 5/13 and thoracolumbar laminectomy and decompression epidural abscess on 5/18. Fevers: Tmax 101.6 over the last 24 hours.  Repeat blood cx NGTD.  PICC line in place since 5/15. Newly diagnosed DM: A1c is 8.9. Right upper lobe 2.2cm spiculating mass: Concerning for malignancy.  Per CCM.   RECOMMENDATIONS:    Continue ceftriaxone 2gm BID Follow up blood cultures If cultures remain negative and ongoing fevers in the next couple days, may repeat imaging of spine Appreciate pulmonary and NSGY help with patient Will follow   Principal Problem:   Abscess in epidural space of cervical spine Active Problems:   Sepsis secondary to UTI ([facility])   Hyponatremia   Hypokalemia   AKI (acute kidney injury) ([place])   Abnormal LFTs   Class II obesity   Hepatic steatosis   Cholelithiasis   Esophageal thickening   Acute low back pain   Encephalopathy acute   Acute respiratory failure (H[facility])   Radiculopathy of lumbar region   E. coli UTI   Meningitis   Bacterial meningitis   Diabetes insipidus ([place])   H/O excision of lamina of cervical vertebra for decompression of spinal cord   Acute pyelonephritis   E coli bacteremia    MEDICATIONS:    Scheduled Meds:  chlorhexidine gluconate (MEDLINE KIT)  15 mL Mouth Rinse BID   Chlorhexidine Gluconate Cloth  6 each Topical Daily   desmopressin  0.05 mg Per Tube QID   docusate  100 mg Per Tube BID   free water  300 mL Per Tube Q2H   heparin injection (subcutaneous)  5,000 Units Subcutaneous Q8H   insulin aspart  0-15 Units Subcutaneous Q4H    insulin aspart  8 Units Subcutaneous Q4H   insulin glargine-yfgn  30 Units Subcutaneous BID   mouth rinse  15 mL Mouth Rinse 10 times per day   midodrine  10 mg Per Tube TID WC   pantoprazole sodium  40 mg Per Tube Q1400   polyethylene glycol  17 g Per Tube Daily   senna  1 tablet Per Tube BID   sodium chloride flush  10-40 mL Intracatheter Q12H   sodium chloride flush  3 mL Intravenous Q12H   zinc oxide   Topical BID   Continuous Infusions:  sodium chloride Stopped (03/05/22 0919)   cefTRIAXone (ROCEPHIN)  IV Stopped (03/04/22 2150)   feeding supplement (PIVOT 1.5 CAL) 1,000 mL (03/03/22 0916)   PRN Meds:.acetaminophen (TYLENOL) oral liquid 160 mg/5 mL, acetaminophen **OR** acetaminophen, bisacodyl, fentaNYL (SUBLIMAZE) injection, lip balm, menthol-cetylpyridinium **OR** phenol, [DISCONTINUED] ondansetron **OR** ondansetron (ZOFRAN) IV, ondansetron **OR** [DISCONTINUED] ondansetron (ZOFRAN) IV, oxyCODONE, polyethylene glycol, sodium chloride flush, sodium chloride flush  SUBJECTIVE:   24 hour events:  NAEON Febrile with Tmax 101.6 yesterday at 4pm Afebrile now HD Stable Blood cx NGTD PICC line since 5/15 5/18 OR cx = E coli WBC 8.3  She just received a bath and is tired.  Ongoing fevers.  Having a Cortrack placed now.   Review of Systems  All other systems reviewed and are negative.  OBJECTIVE:   Blood pressure 106/70, pulse 98, temperature 98.2 F (36.8 C), temperature source Oral, resp. rate 19, height 5' 4"  (1.626 m), weight 94.5 kg, SpO2 97 %. Body mass index is 35.76 kg/m.  Physical Exam Constitutional:      General: She is not in acute distress.    Appearance: Normal appearance.  HENT:     Head: Normocephalic and atraumatic.  Eyes:     Extraocular Movements: Extraocular movements intact.     Conjunctiva/sclera: Conjunctivae normal.  Pulmonary:     Effort: Pulmonary effort is normal. No respiratory distress.  Abdominal:     General: There is no  distension.     Palpations: Abdomen is soft.  Musculoskeletal:     Cervical back: Neck supple.  Skin:    General: Skin is warm and dry.     Findings: No rash.  Neurological:     General: No focal deficit present.     Mental Status: She is alert and oriented to person, place, and time.  Psychiatric:        Mood and Affect: Mood normal.        Behavior: Behavior normal.     Lab Results: Lab Results  Component Value Date   WBC 8.6 03/05/2022   HGB 8.3 (L) 03/05/2022   HCT 27.6 (L) 03/05/2022   MCV 98.2 03/05/2022   PLT 253 03/05/2022    Lab Results  Component Value Date   NA 155 (H) 03/05/2022   K 3.3 (L) 03/05/2022   CO2 31 03/05/2022   GLUCOSE 176 (H) 03/05/2022   BUN 31 (H) 03/05/2022   CREATININE 1.10 (H) 03/05/2022   CALCIUM 7.7 (L) 03/05/2022   GFRNONAA 59 (L) 03/05/2022    Lab Results  Component Value Date   ALT 15 03/03/2022   AST 18 03/03/2022   ALKPHOS 90 03/03/2022   BILITOT 0.7 03/03/2022    No results found for: CRP  No results found for: ESRSEDRATE   I have reviewed the micro and lab results in Epic.  Imaging: No results found.   Imaging independently reviewed in Epic.    [facility] [phone] pager 03/05/2022, 10:13 AM

## 2022-03-05 NOTE — Progress Notes (Signed)
NAME:  Gabrielle Vincent, MRN:  [id number], DOB:  1966-08-12, LOS: 84 ADMISSION DATE:  02/14/2022, CONSULTATION DATE:  5/5 REFERRING MD:  Rai, CHIEF COMPLAINT:  acute encephalopathy and sepsis    History of Present Illness:  56 year old female who was admitted on 5/3 with chief complaint of severe low back pain radiating down her left leg.  Also had associated hematuria and dysuria.  She had no weakness although low did say pain impacted her ability to walk.  Also noted poor p.o. intake, decreased urination as well as urinary retention, hematuria, dysuria, and some nausea.   CT scan was negative for obstructive uropathy did show fatty liver infiltrates, there is cholelithiasis but no acute cholecystitis there is mild circumferential thickening of the distal esophagus global enlargement of the uterus with thickened appearance of the endometrial stripe with radiology recommending nonemergent pelvic ultrasound.  Diagnostic evaluation notable for new AKI with serum creatinine 3.64, acute transaminitis leukocytosis, and hyponatremia of 127 she was admitted for further evaluation, cultures were sent, and she was started on IV ceftriaxone.  On 5/3 patient was noted to be more confused, speech was slurred , Working diagnosis was #1 urinary tract infection with resultant sepsis and also low back pain for which neurosurgery was consulted as MRI finding showed acute herniated L3-L4 disc with left lumbar radiculopathy.  It was felt that pain control via lumbar injection may help in that this could be treated conservatively.  On 5/4 patient becoming intermittently agitated then lethargic, because of this a CT of head was obtained this was severely limited due to motion degraded meant but there was concern about hypodense area in the right basal ganglia section raising concern for acute infarct.  On 5/5 a rapid response was called the patient was severely agitated, pulled out IVs, pull out Foley catheter  Later that  afternoon mental status continued to worsen.  Neurology consult was obtained in addition to this infectious disease was consulted with urine growing E. coli and narrowed antibiotics to cefazolin stopped vancomycin and recommended supportive care because her mental status continued to decline, she had severe metabolic derangements, and we were unable to provide medical care on the [facility] ward she was transferred to the ICU for higher level of care.  On initial arrival she was agitated requiring several nurses to hold her down, tachypneic tachycardic would not follow commands had marked increased work of breathing.  An intraosseous line was placed in the right lower extremity, she was intubated for airway protection to facilitate further MRI imaging, and a right IJ triple-lumen catheter was placed  Pertinent  Medical History  Class II obesity, hepatic steatosis, seasonal allergies, varicose veins diverticulosis, HL   Significant Hospital Events: Including procedures, antibiotic start and stop dates in addition to other pertinent events   5/3 admitted with back pain and urinary tract infection , Further complicated by AKI hyponatremia and multiple metabolic derangements.   CT imaging for renal stones showed no acute abdominal pelvic findings there was no obstructive uropathy there was severe fatty liver disease there was cholelithiasis but no cholecystitis there is colonic diverticulosis but no diverticulitis, mild circumferential thickening of the esophagus globular enlargement of the uterus with radiology recommending nonemergent pelvic ultrasound found to have uterine fibroid abdominal Ultrasound showed fatty liver but was epididymides negative.   MRI of the lumbar spine showed small left subarticular disc extrusion with inferior migration at L3-L4 correlating with radiculopathy.  She was started on ceftriaxone cultures were sent 5/4 increased confusion CT  brain obtained raising concern for possible acute  infarct in the right basal ganglia 5/5 progressive encephalopathy , Worsening leukocytosis, hypernatremia, hyperchloremia, slowly improving renal function, slowly improving procalcitonin, moved to ICU due to delirium, intubated for airway protection and to facilitate MRI imaging right IJ central line placed due to limited IV access.  Seen by neurology, also seen by infectious disease with ceftriaxone changed to cefazolin 5/6 antibiotics changed to meningitis coverage given MRI findings of fluid in the ventricles.  Image guided LP ordered 5/8 LP done with purulent CSF, studies consistent with bacterial meningitis, culture growing gm + cocci 5/10 significant rise in sodium with massive urine output of greater than 7 L leading to concern for development of central DI.  DDAVP and volume resuscitation started 5/11 hypernatremia slowly downtrending, continue DDAVP and IV resuscitation per nephrology 5/12 Sodium downtrended to 147 this am 5/13 to OR for posterior cervical decompressive laminectomy and medial facetectomy C3-T1 for evacuation of epirudal abscess, posterior cervical arthrodesis C3-C7, segmental fixation C3-7 5/18 OR for thoacolumbar laminectomy and decompression of epidural abscess from T-L spine 5/19 extubated to [place]  Interim History / Subjective:  No overnight event, patient became afebrile now white count started trending down  Objective   Blood pressure 106/70, pulse 98, temperature 98.2 F (36.8 C), temperature source Oral, resp. rate 19, height '5\' 4"'$  (1.626 m), weight 94.5 kg, SpO2 97 %.        Intake/Output Summary (Last 24 hours) at 03/05/2022 1022 Last data filed at 03/05/2022 1000 Gross per 24 hour  Intake 6711.42 ml  Output 4975 ml  Net 1736.42 ml   Filed Weights   02/28/22 0315 03/02/22 0500 03/03/22 0424  Weight: 102.6 kg 94.5 kg 94.5 kg   Physical exam: General: Crtitically ill-appearing female, lying on the bed HEENT: [place]/AT, eyes anicteric.  Cortrak in  place Neuro: Awake, following commands, antigravity in bilateral upper extremities at the elbow joint, no movement to painful stimuli in lower extremities Chest: Coarse breath sounds, no wheezes or rhonchi Heart: Regular rate and rhythm, no murmurs or gallops Abdomen: Soft, nontender, nondistended, bowel sounds present Skin: No rash  Assessment & Plan:  Principal Problem:   Abscess in epidural space of cervical spine Active Problems:   Sepsis secondary to UTI (HCC)   Hyponatremia   Hypokalemia   AKI (acute kidney injury) ([place])   Abnormal LFTs   Class II obesity   Hepatic steatosis   Cholelithiasis   Esophageal thickening   Acute low back pain   Encephalopathy acute   Acute respiratory failure ([facility]CC)   Radiculopathy of lumbar region   E. coli UTI   Meningitis   Bacterial meningitis   Diabetes insipidus ([place])   H/O excision of lamina of cervical vertebra for decompression of spinal cord   Acute pyelonephritis   E coli bacteremia   Septic shock secondary to E. Coli- UTI , spinal abscess, and meningitis Acute bacterial meningitis with E. Coli due to direct extension from epidural abscess Extensive epidural abscess extending from foramen magnum to the sacral spine s/p C3-T1 laminectomy and evacuation of abscess, s/p thoracolumbar laminectomy and decompression of epidural abcess from thoracolumbar spine on 5/18. 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 Remains high risk for reintubation. NTS PRN CPT -supplemental O2 to maintain SPO@ >90% If reintubated will need trach-- has already been intubated about 2 weeks.   Shock liver superimposed on known fatty liver disease - improved  Anemia of critical illness Transfuse for Hb<7 or hemodynamically significant bleeding -monitor  Poorly controlled diabetes type 2 HbA1c 8.9. Blood sugars are better controlled now Continue Lantus 30 units twice daily Continue sliding scale with CBG goal 140-180  RUL nodule, concerning for primary lung cancer If she continues to progress she will need a chest CT to better evaluate this.  Respiratory status remains too tenuous to go to radiology for now  Best Practice (right click and "Reselect all SmartList Selections" daily)   Diet/type: TF DVT prophylaxis: prophylactic heparin  GI prophylaxis: PPI Lines: PICC placed 5/15 Foley:  Yes, and it is still needed- DI Code Status:  full code Last date of multidisciplinary goals of care discussion: 5/11, IPAL note dictated 5/10 Sisters updated bedside 5/19  Critical care time:    Total critical care time: 38 minutes  Performed by: Jacky Kindle   Critical care time was exclusive of separately billable procedures and treating other patients.   Critical care was necessary to treat or prevent imminent or life-threatening deterioration.   Critical care was time spent personally by me on the following activities: development of treatment plan with patient and/or surrogate as well as nursing, discussions with consultants, evaluation of patient's response to treatment, examination of patient, obtaining history from patient or surrogate, ordering and performing  treatments and interventions, ordering and review of laboratory studies, ordering and review of radiographic studies, pulse oximetry and re-evaluation of patient's condition.   Jacky Kindle MD [facility] See Amion for pager If no response to pager, please call [phone] until 7pm After 7pm, Please call E-link [phone]

## 2022-03-05 NOTE — Progress Notes (Signed)
Patient ID: Gabrielle Vincent, female   DOB: 05/21/1966, signs are56 y.o.   MRN: [id number] Patient appears awake and much more alert.  She is mouthing some words but difficult to understand.  She is extubated.  Neurologically still trace movement in the left wrist. Clinically things appear to be improving.  She has been extubated now for 3 days.  We will continue to monitor her progress.  Incisions on the back and the neck appear stable.

## 2022-03-05 NOTE — Progress Notes (Signed)
[facility] ADULT ICU REPLACEMENT PROTOCOL   The patient does apply for the [facility] Adult ICU Electrolyte Replacment Protocol based on the criteria listed below:   1.Exclusion criteria: TCTS patients, ECMO patients, and Dialysis patients 2. Is GFR >/= 30 ml/min? Yes.    Patient's GFR today is 59 3. Is SCr </= 2? Yes.   Patient's SCr is 1.10 mg/dL 4. Did SCr increase >/= 0.5 in 24 hours? No. 5.Pt's weight >40kg  Yes.   6. Abnormal electrolyte(s): K+ 3.3  7. Electrolytes replaced per protocol 8.  Call MD STAT for K+ </= 2.5, Phos </= 1, or Mag </= 1 Physician:  Dr York Ram, Elfredia Nevins 03/05/2022 4:04 AM

## 2022-03-05 NOTE — Progress Notes (Signed)
Nutrition Follow-up  DOCUMENTATION CODES:   Obesity unspecified  INTERVENTION:   Tube feeding via cortrak tube:  D/C Pivot 1.5   Change to Jevity 1.5 @ 50 ml/hr (1200 ml/day) 90 ml ProSource TF BID  Provides 1960 kcal, 120 gm protein, 912 ml free water daily  300 ml free water every 2 hours  Total free water: 4512 ml   NUTRITION DIAGNOSIS:   Inadequate oral intake related to inability to eat as evidenced by NPO status. Ongoing.   GOAL:   Provide needs based on ASPEN/SCCM guidelines Met with TF at goal   MONITOR:   Vent status, TF tolerance, Labs, Weight trends, Skin  REASON FOR ASSESSMENT:   Consult Enteral/tube feeding initiation and management  ASSESSMENT:   56 y.o. female with medical history of seasonal allergies, obesity, BLE swelling, varicose veins, HLD, hepatic steatosis, atherosclerosis, and diverticulosis. She presented to the ED due to severe low back pain that radiated to LLE and was associated with hematuria and dysuria. In the ED, she was noted to have multiple electrolyte abnormalities. CT renal stone study showed no obstructive uropathy, severe fatty infiltration of the liver with mild hepatomegaly, cholelithiasis with no acute cholecystitis, colonic diverticulosis, and mild circumferential thickening of the distal esophagus may represent esophagitis. She was admitted for sepsis 2/2 UTI.  Pt discussed during ICU rounds and with RN and MD. Per RN pt with loose stools; bowel regimen medications held per RN.    Spoke with pt and her family. Discussed cortrak and TF.    ID following for e coli UTI with extensive epidural abscess and associated meningitis.  Pt with newly dx DM. Per MD pt paraplegic.  Pt on DDAVP for DI.  Per CCM pt with RUL nodule concerning ofr primary lung cancer.   Admission weight: 95.3 kg Current weight: 94.5 kg  Mild edema noted   5/3 admitted with back pain and UTI 5/5 intubated with progressive encephalopathy 5/8 s/p LP  consistent with bacterial meningitis 5/10 developed DI due to meningitis  5/13 s/p OR for decompressive laminectomy and evacuation of epidural abscess 5/18 s/p lami and decompression of epidural abscess  5/19 extubated 5/22 s/p cortrak placement; tip in distal stomach   Medications reviewed and include: colace, SSI, novolog, semglee, protonix, miralax, senokot  Labs reviewed: Na 155, K 3.3 Vitamin B12: 4495 (5/5)  CBG's: 97-170  UOP: 4725 ml    Diet Order:   Diet Order             Diet NPO time specified  Diet effective midnight                   EDUCATION NEEDS:   No education needs have been identified at this time  Skin:  Skin Assessment: Reviewed RN Assessment  Last BM:  325 ml via rectal tube  Height:   Ht Readings from Last 1 Encounters:  02/21/22 _0  (1.626 m)    Weight:   Wt Readings from Last 1 Encounters:  03/03/22 94.5 kg    Ideal Body Weight:  54.54 kg  BMI:  Body mass index is 35.76 kg/m.  Estimated Nutritional Needs:   Kcal:  1850-2100  Protein:  110-120 grams  Fluid:  >1.8 L/day  Lockie Pares., RD, LDN, CNSC See AMiON for contact information

## 2022-03-05 NOTE — Procedures (Signed)
Cortrak  Person Inserting Tube:  Alroy Dust, Natalin Bible L, RD Tube Type:  Cortrak - 43 inches Tube Size:  10 Tube Location:  Left nare Secured by: Bridle Technique Used to Measure Tube Placement:  Marking at nare/corner of mouth Cortrak Secured At:  66 cm  Cortrak Tube Team Note:  Consult received to place a Cortrak feeding tube.   X-ray is required, abdominal x-ray has been ordered by the Cortrak team. Please confirm tube placement before using the Cortrak tube.   If the tube becomes dislodged please keep the tube and contact the Cortrak team at [email or web address] ([email or web address]) for replacement.  If after hours and replacement cannot be delayed, place a NG tube and confirm placement with an abdominal x-ray.    Hermina Barters RD, LDN Clinical Dietitian See Shea Evans for contact information.

## 2022-03-05 NOTE — Progress Notes (Signed)
Orthopedic Tech Progress Note Patient Details:  Gabrielle Vincent [birth year] [id number]  Ortho Devices Type of Ortho Device: Prafo boot/shoe Ortho Device/Splint Location: BLE Ortho Device/Splint Interventions: Ordered, Application, Adjustment   Post Interventions Patient Tolerated: Well Instructions Provided: Care of [place] 03/05/2022, 7:22 PM

## 2022-03-05 NOTE — Evaluation (Signed)
Occupational Therapy Evaluation Patient Details Name: Gabrielle Vincent MRN: [id number] DOB: [birth year] Today's Date: 03/05/2022   History of Present Illness 56 y.o. female presents to [facility] hospital on 02/14/2022 with complaints of severe back pain associated with hematuria and dysuria. Pt admitted for management of UTI and AKI. MRI demonstrates small herniated nucleus pulposus at L3-L4 on the left side. 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 Pt intubated 02/16/2022. Cultures positive for meningitis. MRI 02/23/2022 demonstrates diffuse epidural abscess with cord compression and likely signal change. Pt underwent C3-T1 decompressive laminectomy with epidural abscess evaucation along with C3-7 fusion on 5/13. MRI 5/16 demonstrates Extensive thoracic epidural abscess. 5/18- Pt underwent Thoracolumbar laminectomy and decompression of epidural abscess. Extubated 5/19. PMH includes obesity, HLD, diverticulosis.   Clinical Impression   Gabrielle Vincent was evaluated s/p the above admission list, she is independent in all ADL/IADL at baseline. Her family is very supportive and able to assist at d/c. Upon arrival pt was lethargic and fatigued therefore session completed at bed level. Pt had difficulty communicating, she was mouthing words but no productive speech noted. Pt asked to shake head yes/no to questions throughout, which seemed to be accurate about 50% of the time. Pt is moving BUEs equally but is generally weak and needs AAROM for full range. Due to weakness, pain, impaired cognition and fatigue she required total A for all ADLs and bed mobility this date. Pt will benefit from OT acutely. Recommend d/c to AIR for maximal functional recovery with a multidisciplinary approach.      Recommendations for follow up therapy are one component of a multi-disciplinary discharge planning process, led by the attending physician.  Recommendations may be updated based on patient status, additional  functional criteria and insurance authorization.   Follow Up Recommendations  Acute inpatient rehab (3hours/day)       Patient can return home with the following Two people to help with walking and/or transfers;Two people to help with bathing/dressing/bathroom;Assistance with cooking/housework;Assistance with feeding;Direct supervision/assist for medications management;Direct supervision/assist for financial management;Assist for transportation;Help with stairs or ramp for entrance    Functional Status Assessment  Patient has had a recent decline in their functional status and demonstrates the ability to make significant improvements in function in a reasonable and predictable amount of time.  Equipment Recommendations  Other (comment) (pending pt progression)    Recommendations for Other Services Rehab consult     Precautions / Restrictions Precautions Precautions: Fall;Back Precaution Booklet Issued: No Precaution Comments: plan to review precautions next session Restrictions Weight Bearing Restrictions: No      Mobility Bed Mobility Overal bed mobility: Needs Assistance Bed Mobility: Rolling Rolling: Total assist         General bed mobility comments: Pt not participating much this session and ultimately required total A at bed level    Transfers                              ADL either performed or assessed with clinical judgement   ADL Overall ADL's : Needs assistance/impaired                                       General ADL Comments: total A for ADLs at bed level this date     Vision Baseline Vision/History: 0 No visual deficits Ability to See in  Adequate Light: 0 Adequate Vision Assessment?: Vision impaired- to be further tested in functional context Additional Comments: difficult to assess, pt not attending to the L as well            Pertinent Vitals/Pain Pain Assessment Pain Assessment: Faces Faces Pain Scale: Hurts a  little bit Pain Location: BUEs with ROM Pain Descriptors / Indicators: Grimacing Pain Intervention(s): Limited activity within patient's tolerance, Monitored during session     Hand Dominance Right   Extremity/Trunk Assessment Upper Extremity Assessment Upper Extremity Assessment: RUE deficits/detail;LUE deficits/detail RUE Deficits / Details: PROM WFL, pt is grossly 2/5 at this time RUE Sensation: WNL RUE Coordination: decreased fine motor;decreased gross motor LUE Deficits / Details: PROM WFL, pt is grossly 2/5 at this time LUE Sensation: WNL LUE Coordination: decreased fine motor;decreased gross motor   Lower Extremity Assessment Lower Extremity Assessment: Defer to PT evaluation   Cervical / Trunk Assessment Cervical / Trunk Assessment: Back Surgery   Communication Communication Communication: Expressive difficulties   Cognition Arousal/Alertness: Awake/alert Behavior During Therapy: Anxious, Flat affect Overall Cognitive Status: Difficult to assess       General Comments: following most commands to complete MMT/ROM, pt mouthing words almost the entire session, no productive speech noted. Asked pt to shake head yes/no to questions which she completed accurately ~50% of the time     General Comments  brother present and supportive     Home Living Family/patient expects to be discharged to:: Private residence Living Arrangements: Other relatives Available Help at Discharge: Family;Available PRN/intermittently Type of Home: House Home Access: Stairs to enter [employer] of Steps: 4 Entrance Stairs-Rails: Right;Left Home Layout: One level     Bathroom Shower/Tub: Tub/shower unit;Walk-in shower   Bathroom Toilet: Standard     Home Equipment: Hand held shower head          Prior Functioning/Environment Prior Level of Function : Independent/Modified Independent;Working/employed;Driving             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 Problem List: Decreased strength;Decreased range of motion;Decreased activity tolerance;Impaired balance (sitting and/or standing);Decreased coordination;Decreased cognition;Decreased safety awareness;Decreased knowledge of use of DME or AE;Decreased knowledge of precautions;Impaired UE functional use;Pain      OT Treatment/Interventions: Self-care/ADL training;Therapeutic exercise;DME and/or AE instruction;Therapeutic activities;Patient/family education;Balance training;Neuromuscular education    OT Goals(Current goals can be found in the care plan section) Acute Rehab OT Goals Patient Stated Goal: unable to state OT Goal Formulation: With patient Time For Goal Achievement: 03/19/22 Potential to Achieve Goals: Good ADL Goals Pt Will Perform Grooming: sitting;with mod assist Pt Will Perform Upper Body Dressing: with mod assist;sitting Pt Will Perform Lower Body Dressing: with max assist;sit to/from stand Pt Will Transfer to Toilet: with mod assist;stand pivot transfer;bedside commode Additional ADL Goal #1: Pt will complete bed mobility with mod A as a precursor to ADLs Additional ADL Goal #2: Pt will tolerate at least 5 minutes of EOB funcitonal activity while sitting unsupported  OT Frequency: Min 2X/week    Co-evaluation PT/OT/SLP Co-Evaluation/Treatment: Yes Reason for Co-Treatment: Complexity of the patient's impairments (multi-system involvement);For patient/therapist safety;To address functional/ADL transfers   OT goals addressed during session: ADL's and self-care      AM-PAC OT "6 Clicks" Daily Activity     Outcome Measure Help from another person eating meals?: Total Help from another person taking care of personal grooming?: Total Help from another person toileting, which includes using toliet, bedpan, or  urinal?: Total Help from another person bathing (including washing, rinsing, drying)?: Total Help from another person to put on and taking off  regular upper body clothing?: Total Help from another person to put on and taking off regular lower body clothing?: Total 6 Click Score: 6   End of Session Nurse Communication: Mobility status  Activity Tolerance: Patient tolerated treatment well Patient left: in bed;with call bell/phone within reach;with bed alarm set;with family/visitor present  OT Visit Diagnosis: Other abnormalities of gait and mobility (R26.89);Muscle weakness (generalized) (M62.81);Pain                Time: 1011-1030 OT Time Calculation (min): 19 min Charges:  OT General Charges $OT Visit: 1 Visit OT Evaluation $OT Eval Moderate Complexity: 1 Mod   Bethanie Bloxom A Wolf Boulay 03/05/2022, 11:34 AM

## 2022-03-06 ENCOUNTER — Inpatient Hospital Stay (HOSPITAL_COMMUNITY): Payer: BC Managed Care – PPO

## 2022-03-06 DIAGNOSIS — N390 Urinary tract infection, site not specified: Secondary | ICD-10-CM | POA: Diagnosis not present

## 2022-03-06 DIAGNOSIS — N179 Acute kidney failure, unspecified: Secondary | ICD-10-CM | POA: Diagnosis not present

## 2022-03-06 DIAGNOSIS — B9620 Unspecified Escherichia coli [E. coli] as the cause of diseases classified elsewhere: Secondary | ICD-10-CM | POA: Diagnosis not present

## 2022-03-06 DIAGNOSIS — G009 Bacterial meningitis, unspecified: Secondary | ICD-10-CM | POA: Diagnosis not present

## 2022-03-06 DIAGNOSIS — N10 Acute tubulo-interstitial nephritis: Secondary | ICD-10-CM | POA: Diagnosis not present

## 2022-03-06 DIAGNOSIS — G061 Intraspinal abscess and granuloma: Secondary | ICD-10-CM | POA: Diagnosis not present

## 2022-03-06 DIAGNOSIS — A419 Sepsis, unspecified organism: Secondary | ICD-10-CM | POA: Diagnosis not present

## 2022-03-06 LAB — GLUCOSE, CAPILLARY
Glucose-Capillary: 111 mg/dL — ABNORMAL HIGH (ref 70–99)
Glucose-Capillary: 148 mg/dL — ABNORMAL HIGH (ref 70–99)
Glucose-Capillary: 123 mg/dL — ABNORMAL HIGH (ref 70–99)
Glucose-Capillary: 165 mg/dL — ABNORMAL HIGH (ref 70–99)
Glucose-Capillary: 108 mg/dL — ABNORMAL HIGH (ref 70–99)

## 2022-03-06 LAB — BASIC METABOLIC PANEL
Anion gap: 7 (ref 5–15)
BUN: 29 mg/dL — ABNORMAL HIGH (ref 6–20)
CO2: 29 mmol/L (ref 22–32)
Calcium: 8 mg/dL — ABNORMAL LOW (ref 8.9–10.3)
Chloride: 114 mmol/L — ABNORMAL HIGH (ref 98–111)
Creatinine, Ser: 0.93 mg/dL (ref 0.44–1.00)
GFR, Estimated: >60 mL/min (ref >60)
Glucose, Bld: 96 mg/dL (ref 70–99)
Potassium: 3.9 mmol/L (ref 3.5–5.1)
Sodium: 150 mmol/L — ABNORMAL HIGH (ref 135–145)

## 2022-03-06 LAB — AEROBIC/ANAEROBIC CULTURE W GRAM STAIN (SURGICAL/DEEP WOUND): Gram Stain: NONE SEEN

## 2022-03-06 LAB — CBC WITH DIFFERENTIAL/PLATELET
Abs Immature Granulocytes: 0.21 10*3/uL — ABNORMAL HIGH (ref 0.00–0.07)
Basophils Absolute: 0.1 10*3/uL (ref 0.0–0.1)
Basophils Relative: 1 %
Eosinophils Absolute: 0.3 10*3/uL (ref 0.0–0.5)
Eosinophils Relative: 2 %
HCT: 28.5 % — ABNORMAL LOW (ref 36.0–46.0)
Hemoglobin: 8.7 g/dL — ABNORMAL LOW (ref 12.0–15.0)
Immature Granulocytes: 2 %
Lymphocytes Relative: 27 %
Lymphs Abs: 3.1 10*3/uL (ref 0.7–4.0)
MCH: 29.6 pg (ref 26.0–34.0)
MCHC: 30.5 g/dL (ref 30.0–36.0)
MCV: 96.9 fL (ref 80.0–100.0)
Monocytes Absolute: 0.8 10*3/uL (ref 0.1–1.0)
Monocytes Relative: 7 %
Neutro Abs: 7 10*3/uL (ref 1.7–7.7)
Neutrophils Relative %: 61 %
Platelets: 251 10*3/uL (ref 150–400)
RBC: 2.94 MIL/uL — ABNORMAL LOW (ref 3.87–5.11)
RDW: 16.4 % — ABNORMAL HIGH (ref 11.5–15.5)
WBC: 11.5 10*3/uL — ABNORMAL HIGH (ref 4.0–10.5)
nRBC: 0.4 % — ABNORMAL HIGH (ref 0.0–0.2)

## 2022-03-06 LAB — SODIUM: Sodium: 150 mmol/L — ABNORMAL HIGH (ref 135–145)

## 2022-03-06 IMAGING — MR MR THORACIC SPINE WO/W CM
4 of 12 series · 16 of 48 positions shown · IV contrast (gadavist)
Comparison: [DATE]

CLINICAL DATA: Epidural abscess

EXAM:
MRI CERVICAL AND THORACIC SPINE WITHOUT AND WITH CONTRAST
TECHNIQUE: Multiplanar and multiecho pulse sequences of the cervical spine, to
include the craniocervical junction and cervicothoracic junction,
and the thoracic spine, were obtained without and with intravenous
contrast.
CONTRAST:  9mL GADAVIST GADOBUTROL 1 MMOL/ML IV SOLN

[Series 12: T2 · sagittal · 3.0mm · 0.66mm/px · 2 of 18 slices shown (1 of 4)]
[im 1/18]
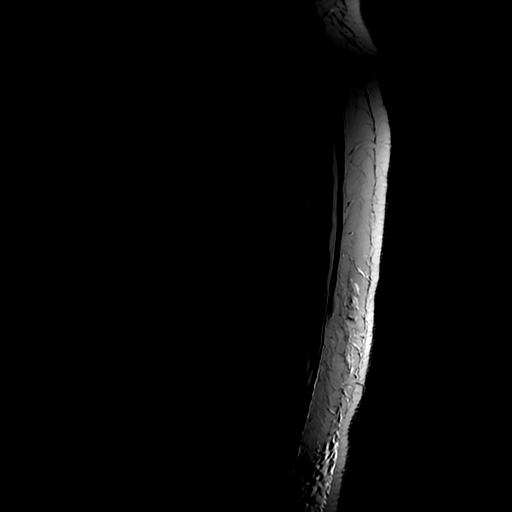
[im 18/18]
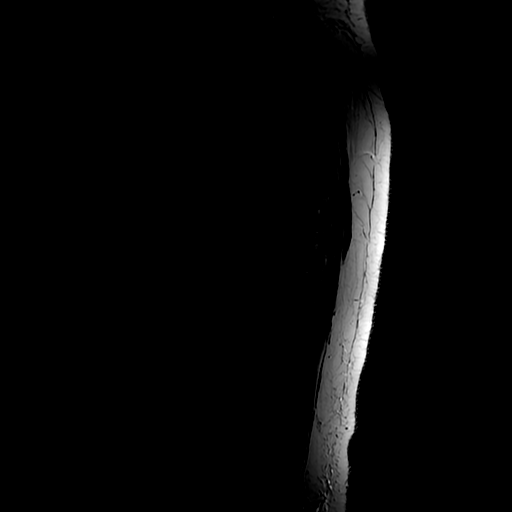

[Series 15: T2 · axial · 3.0mm · 0.39mm/px · z∈[-248,-60]mm · 4 of 47 slices shown (2 of 4)]
[im 1/47]
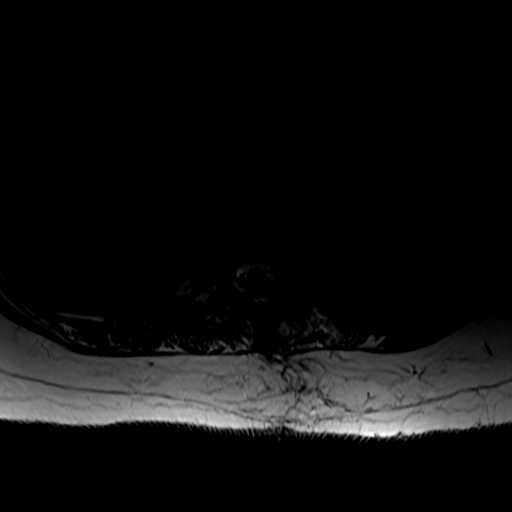
[im 16/47]
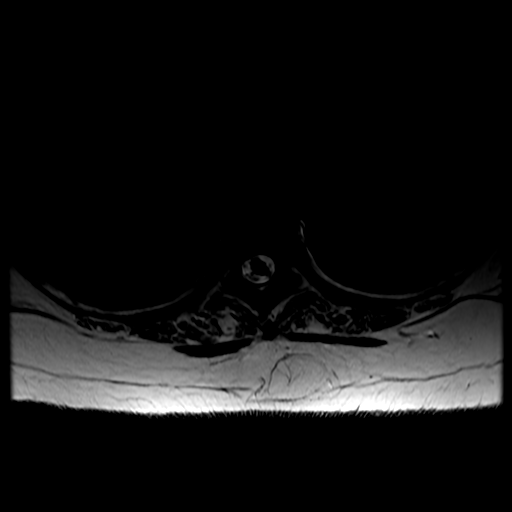
[im 31/47]
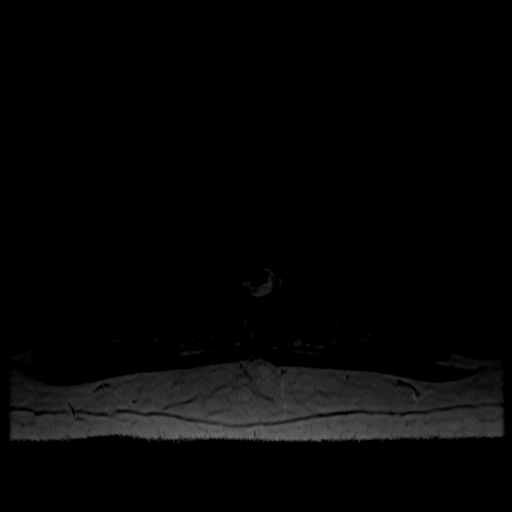
[im 47/47]
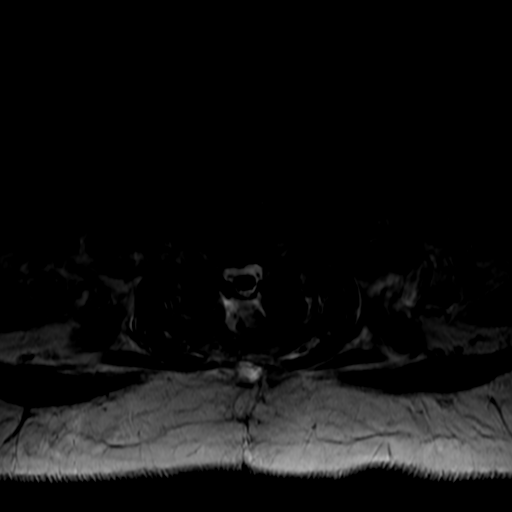

[Series 16: T2 · axial · 3.0mm · 0.39mm/px · z∈[-299,-30]mm · 7 of 78 slices shown (3 of 4)]
[im 1/78]
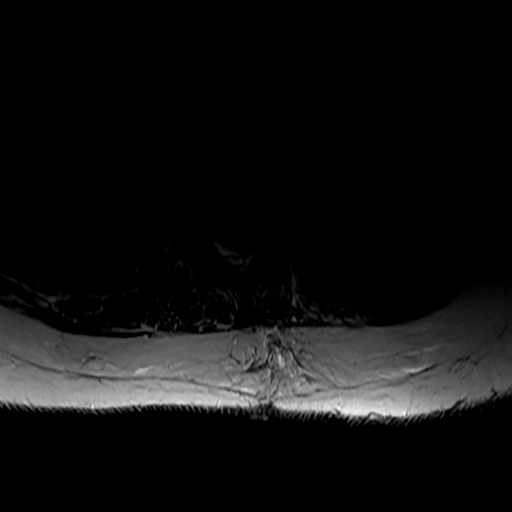
[im 13/78]
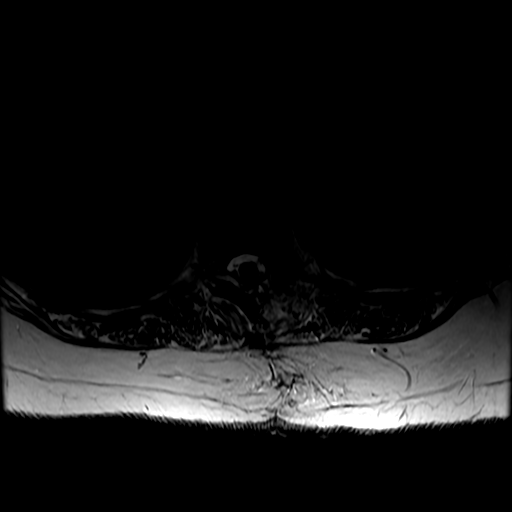
[im 26/78]
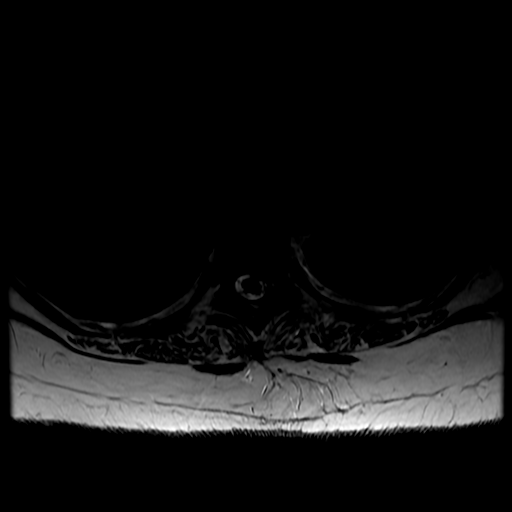
[im 39/78]
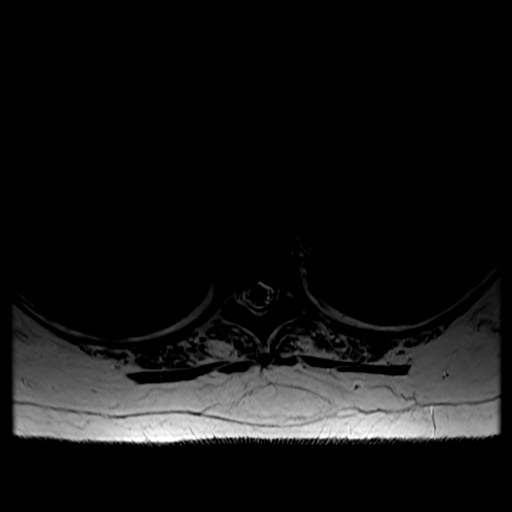
[im 52/78]
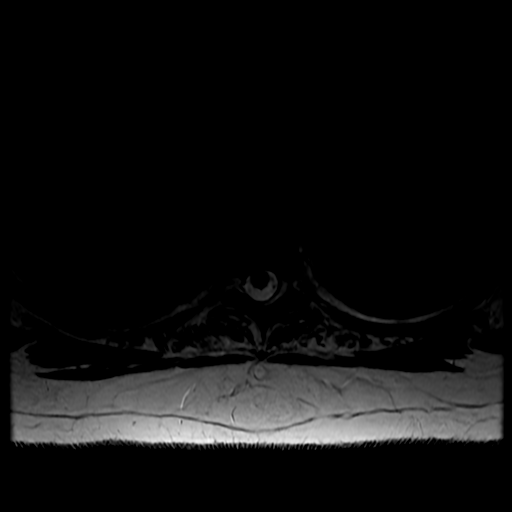
[im 65/78]
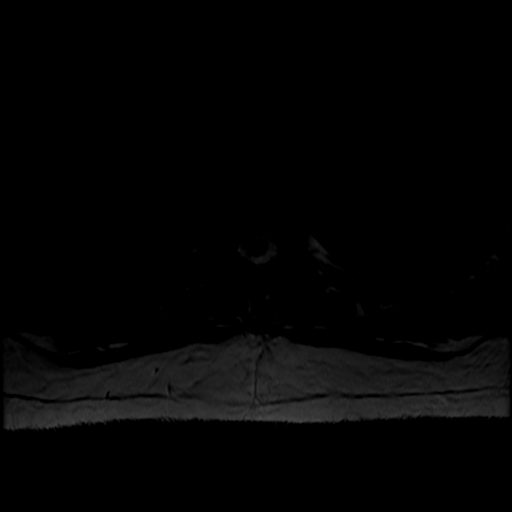
[im 78/78]
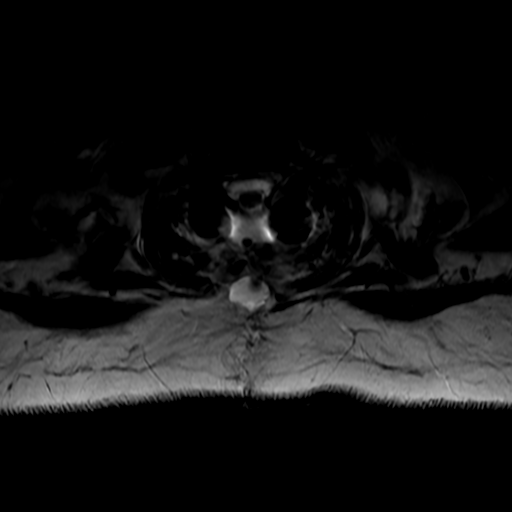

[Series 22: T2 · axial · non-contrast · 3.0mm · 0.39mm/px · z∈[-268,-60]mm · 3 of 51 slices shown (4 of 4)]
[im 1/51]
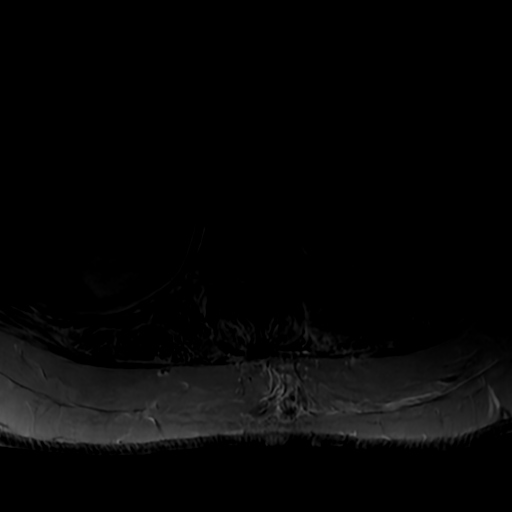
[im 26/51]
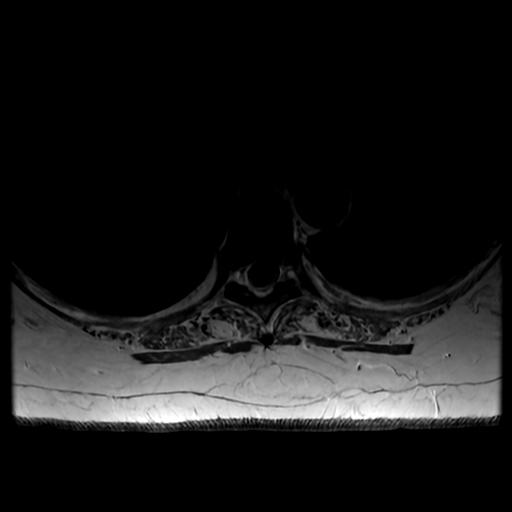
[im 51/51]
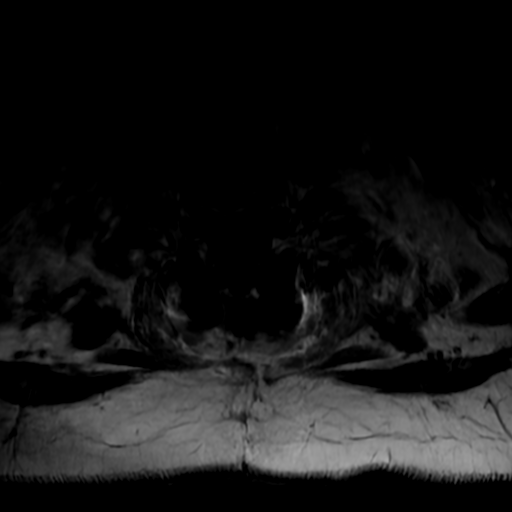

[16 of 48 positions shown; findings below may reference images not displayed]

FINDINGS: Evaluation is somewhat limited by motion artifact.

MRI CERVICAL SPINE FINDINGS

Alignment: Straightening of the normal cervical lordosis. No
significant listhesis.

Vertebrae: Status post interval posterior decompression and fixation
C3-C7. Susceptibility artifact from the hardware limits evaluation
at these levels. Increased T2 signal and enhancement about the C4-C5
and C5-C6 discs and endplates, without definite loss of cortical
signal.

Cord: Evaluation is limited by susceptibility. The imaged portions
of the cord are normal in signal and morphology. Previously noted
epidural abscess is no longer seen.

Posterior Fossa, vertebral arteries, paraspinal tissues: Extensive
edema in the soft tissues posterior to the cervical spine with a
small fluid collection in the subcutaneous fat (series 9, image 23).
Additional small fluid collection about the spinous process of T1
(series 9, image 34). These are not unexpected postoperatively. No
evidence of a deeper fluid collection near the thecal sac.

Enhancing lesions are seen in the cerebellum (series 23, image 7),
which were not on the prior exam, incompletely evaluated. Decreased
leptomeningeal enhancement about the base of the brain and posterior
fossa.

Disc levels:

C2-C3: No significant disc bulge. No spinal canal stenosis or
neuroforaminal narrowing.

C3-C4: Status post decompression. No spinal canal stenosis or neural
foraminal narrowing.

C4-C5: Disc height loss with disc osteophyte complex. Status post
decompression. No spinal canal stenosis or neural foraminal
narrowing.

C5-C6: Disc height loss with disc osteophyte complex. Status post
decompression. No spinal canal stenosis or definite neural foraminal
narrowing.

C6-C7: Disc height loss with disc osteophyte complex. Status post
decompression. No spinal canal stenosis. Moderate left neural
foraminal narrowing.

C7-T1: Small right paracentral disc protrusion. No spinal canal
stenosis or neural foraminal narrowing.

MRI THORACIC SPINE FINDINGS

Alignment:  Physiologic.

Vertebrae: Diffusely decreased marrow signal which is nonspecific
but can be seen with anemia, smoking, and obesity. No acute fracture
or suspicious osseous lesion.

Cord: Previously noted epidural collection is now not definitively
seen until the level of T11, where it is significantly decreased in
size, status post interval left hemilaminectomy and drainage at
T11-T12. The collection measures up to 5 mm at the level of T12,
previously 9 mm when remeasured similarly. The collection does
appear to extend off the inferior field of view into the lumbar
epidural space.

Paraspinal and other soft tissues: Postsurgical changes posterior to
T11 and T12. No evidence of significant fluid collection in the soft
tissues.

Disc levels:

No significant residual spinal canal stenosis in the thoracic spine.
IMPRESSION: 1. Evaluation is somewhat limited by motion artifact. Within this
limitation, there has been interval posterior decompression and
fixation at C3-C7 and left hemilaminectomy at T11-T12, with drainage
of the majority of the previously noted epidural abscess. Epidural
collection is now seen from the level of T11, extending off the
inferior field of view in the lumbar spine, decreased in size from
the prior exam, measuring up to 5 mm at the level of T12, previously
9 mm when remeasured similarly.
2. Enhancing lesions are noted in the imaged portion of the
cerebellum, which is incompletely evaluated. An MRI with and without
contrast is recommended for further evaluation.
3. Edema in the soft tissues posterior to the cervical spine with a
small fluid collection in the subcutaneous fat and about the spinous
process of T1, which are not unexpected postoperatively. No evidence
of a deeper fluid collection near the thecal sac or abscess.
4. Evaluation of the cervical spine is limited by susceptibility,
however there appears to be increased T2 signal and enhancement
about the C4-C5 and C5-C6 discs and endplates, without definite loss
cortical signal. This may be inflammatory or degenerative but could
also be seen with early discitis osteomyelitis. Attention on
follow-up.
5. No residual spinal canal stenosis in the cervical or thoracic
spine.

These results will be called to the ordering clinician or
representative by the Radiologist Assistant, and communication
documented in the PACS or [REDACTED].

## 2022-03-06 IMAGING — MR MR THORACIC SPINE WO/W CM
4 of 12 series · 16 of 48 positions shown · IV contrast (gadavist)
Comparison: [DATE]

CLINICAL DATA: Epidural abscess

EXAM:
MRI CERVICAL AND THORACIC SPINE WITHOUT AND WITH CONTRAST
TECHNIQUE: Multiplanar and multiecho pulse sequences of the cervical spine, to
include the craniocervical junction and cervicothoracic junction,
and the thoracic spine, were obtained without and with intravenous
contrast.
CONTRAST:  9mL GADAVIST GADOBUTROL 1 MMOL/ML IV SOLN

[Series 12: T2 · sagittal · 3.0mm · 0.66mm/px · 2 of 18 slices shown (1 of 4)]
[im 1/18]
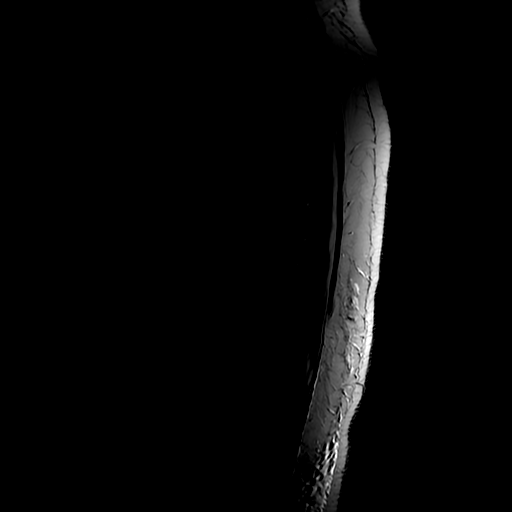
[im 18/18]
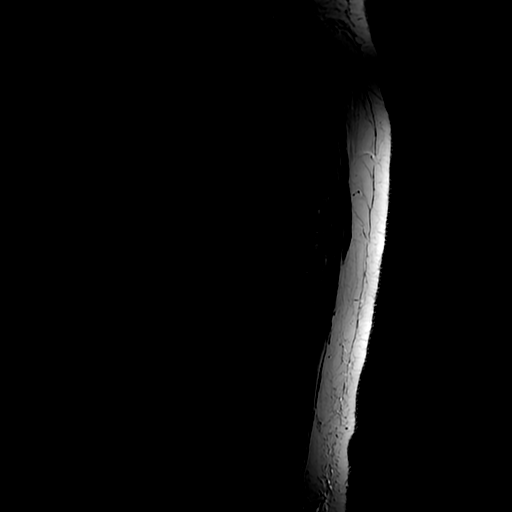

[Series 15: T2 · axial · 3.0mm · 0.39mm/px · z∈[-248,-60]mm · 4 of 47 slices shown (2 of 4)]
[im 1/47]
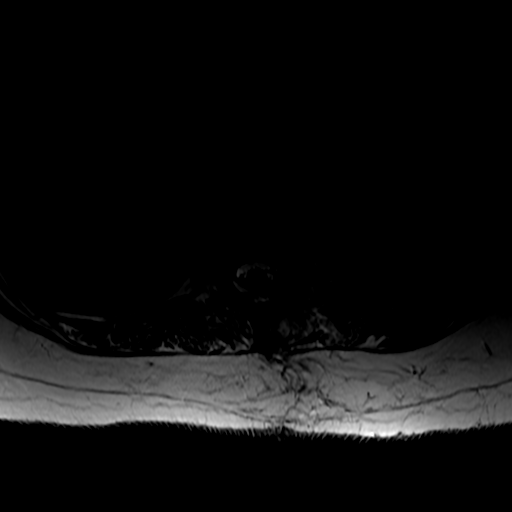
[im 16/47]
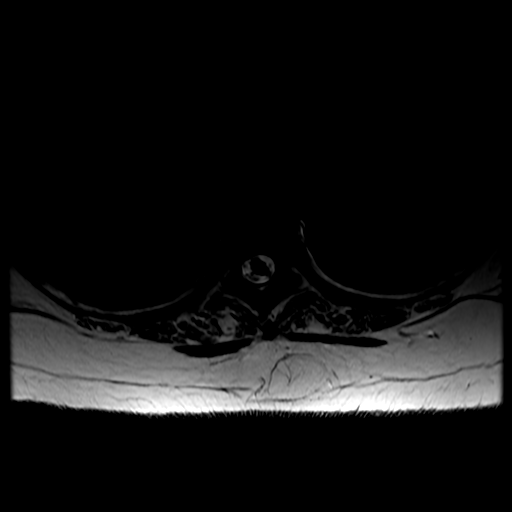
[im 31/47]
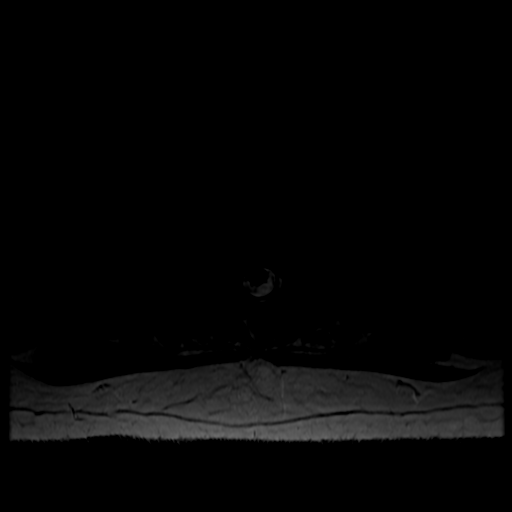
[im 47/47]
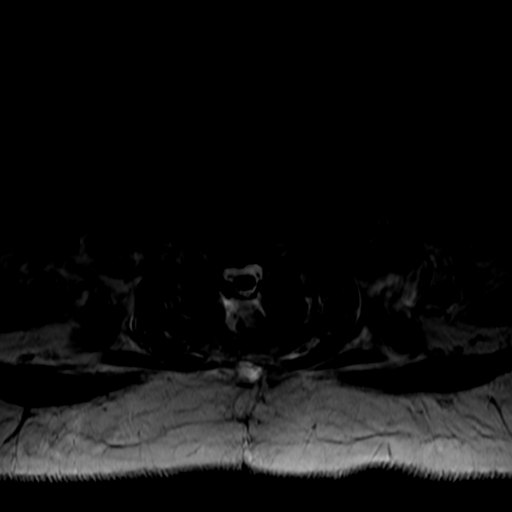

[Series 16: T2 · axial · 3.0mm · 0.39mm/px · z∈[-299,-30]mm · 7 of 78 slices shown (3 of 4)]
[im 1/78]
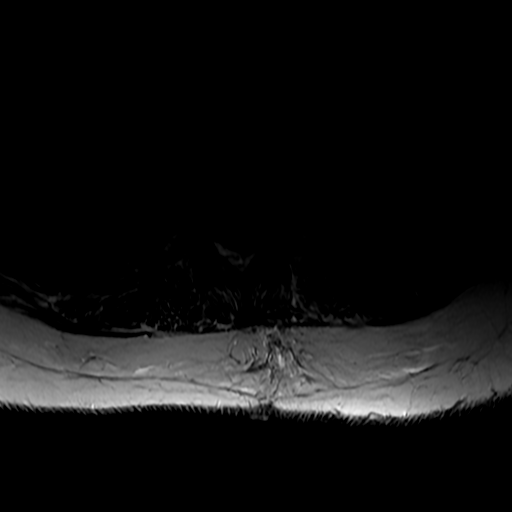
[im 13/78]
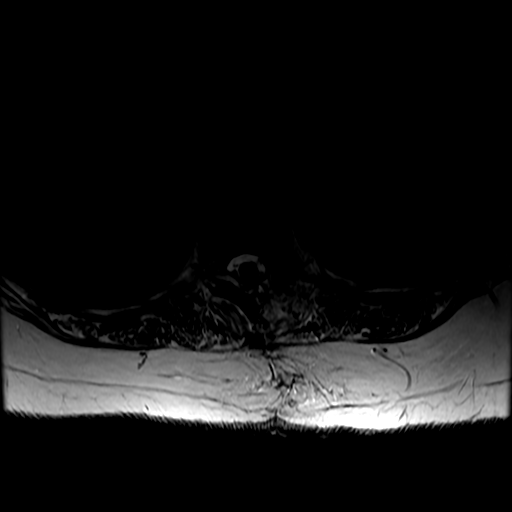
[im 26/78]
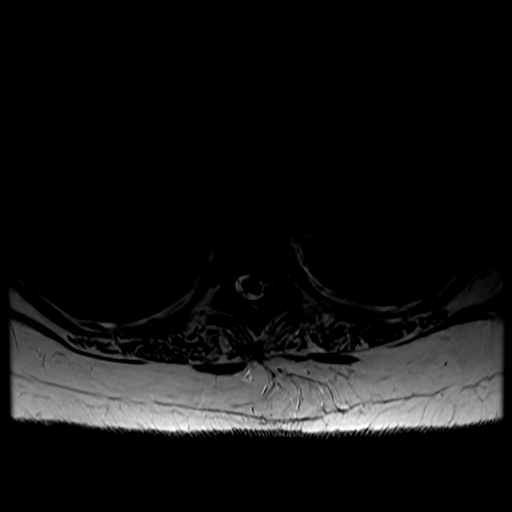
[im 39/78]
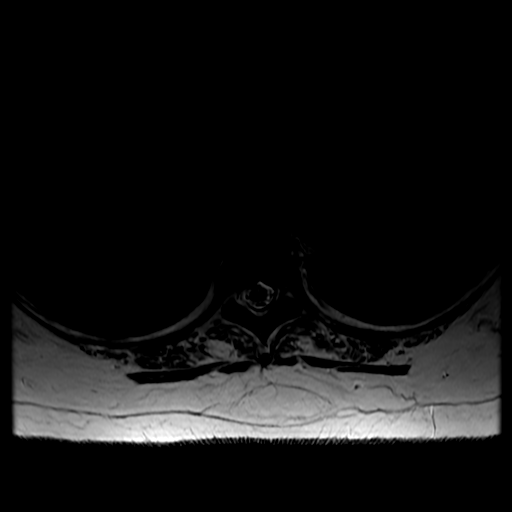
[im 52/78]
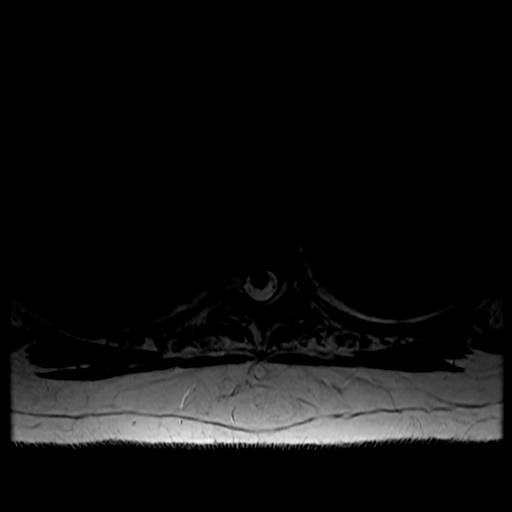
[im 65/78]
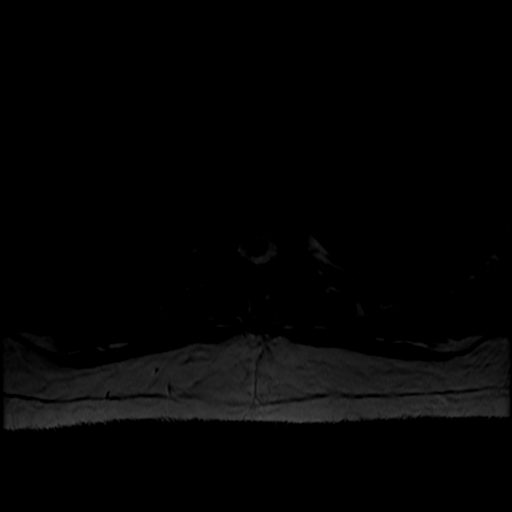
[im 78/78]
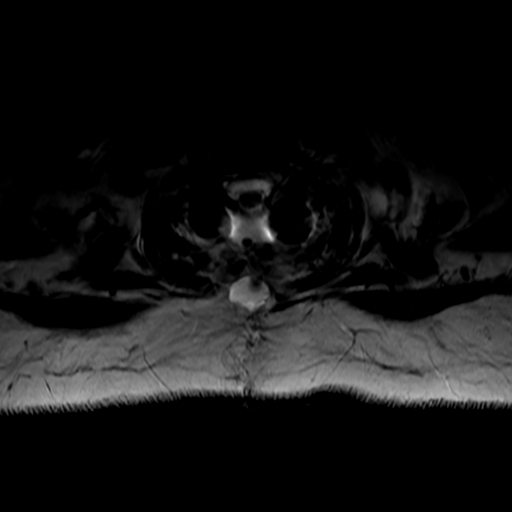

[Series 22: T2 · axial · non-contrast · 3.0mm · 0.39mm/px · z∈[-268,-60]mm · 3 of 51 slices shown (4 of 4)]
[im 1/51]
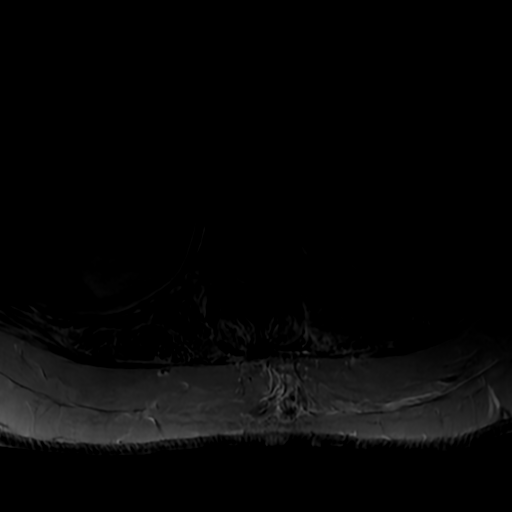
[im 26/51]
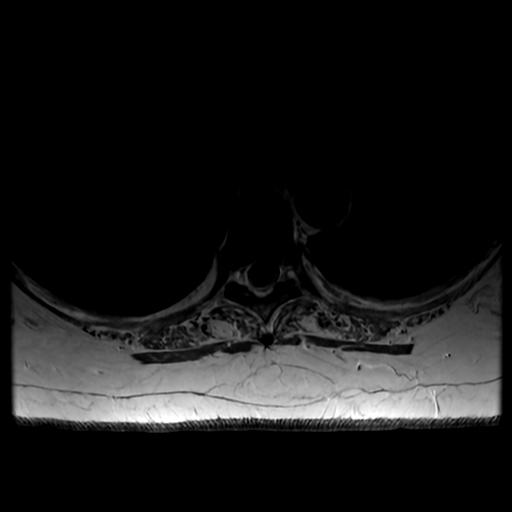
[im 51/51]
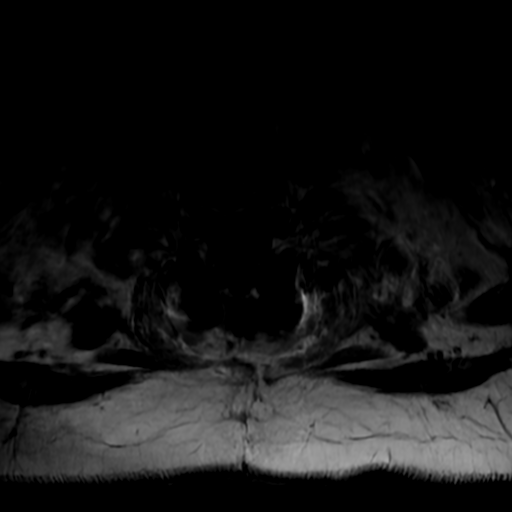

[16 of 48 positions shown; findings below may reference images not displayed]

FINDINGS: Evaluation is somewhat limited by motion artifact.

MRI CERVICAL SPINE FINDINGS

Alignment: Straightening of the normal cervical lordosis. No
significant listhesis.

Vertebrae: Status post interval posterior decompression and fixation
C3-C7. Susceptibility artifact from the hardware limits evaluation
at these levels. Increased T2 signal and enhancement about the C4-C5
and C5-C6 discs and endplates, without definite loss of cortical
signal.

Cord: Evaluation is limited by susceptibility. The imaged portions
of the cord are normal in signal and morphology. Previously noted
epidural abscess is no longer seen.

Posterior Fossa, vertebral arteries, paraspinal tissues: Extensive
edema in the soft tissues posterior to the cervical spine with a
small fluid collection in the subcutaneous fat (series 9, image 23).
Additional small fluid collection about the spinous process of T1
(series 9, image 34). These are not unexpected postoperatively. No
evidence of a deeper fluid collection near the thecal sac.

Enhancing lesions are seen in the cerebellum (series 23, image 7),
which were not on the prior exam, incompletely evaluated. Decreased
leptomeningeal enhancement about the base of the brain and posterior
fossa.

Disc levels:

C2-C3: No significant disc bulge. No spinal canal stenosis or
neuroforaminal narrowing.

C3-C4: Status post decompression. No spinal canal stenosis or neural
foraminal narrowing.

C4-C5: Disc height loss with disc osteophyte complex. Status post
decompression. No spinal canal stenosis or neural foraminal
narrowing.

C5-C6: Disc height loss with disc osteophyte complex. Status post
decompression. No spinal canal stenosis or definite neural foraminal
narrowing.

C6-C7: Disc height loss with disc osteophyte complex. Status post
decompression. No spinal canal stenosis. Moderate left neural
foraminal narrowing.

C7-T1: Small right paracentral disc protrusion. No spinal canal
stenosis or neural foraminal narrowing.

MRI THORACIC SPINE FINDINGS

Alignment:  Physiologic.

Vertebrae: Diffusely decreased marrow signal which is nonspecific
but can be seen with anemia, smoking, and obesity. No acute fracture
or suspicious osseous lesion.

Cord: Previously noted epidural collection is now not definitively
seen until the level of T11, where it is significantly decreased in
size, status post interval left hemilaminectomy and drainage at
T11-T12. The collection measures up to 5 mm at the level of T12,
previously 9 mm when remeasured similarly. The collection does
appear to extend off the inferior field of view into the lumbar
epidural space.

Paraspinal and other soft tissues: Postsurgical changes posterior to
T11 and T12. No evidence of significant fluid collection in the soft
tissues.

Disc levels:

No significant residual spinal canal stenosis in the thoracic spine.
IMPRESSION: 1. Evaluation is somewhat limited by motion artifact. Within this
limitation, there has been interval posterior decompression and
fixation at C3-C7 and left hemilaminectomy at T11-T12, with drainage
of the majority of the previously noted epidural abscess. Epidural
collection is now seen from the level of T11, extending off the
inferior field of view in the lumbar spine, decreased in size from
the prior exam, measuring up to 5 mm at the level of T12, previously
9 mm when remeasured similarly.
2. Enhancing lesions are noted in the imaged portion of the
cerebellum, which is incompletely evaluated. An MRI with and without
contrast is recommended for further evaluation.
3. Edema in the soft tissues posterior to the cervical spine with a
small fluid collection in the subcutaneous fat and about the spinous
process of T1, which are not unexpected postoperatively. No evidence
of a deeper fluid collection near the thecal sac or abscess.
4. Evaluation of the cervical spine is limited by susceptibility,
however there appears to be increased T2 signal and enhancement
about the C4-C5 and C5-C6 discs and endplates, without definite loss
cortical signal. This may be inflammatory or degenerative but could
also be seen with early discitis osteomyelitis. Attention on
follow-up.
5. No residual spinal canal stenosis in the cervical or thoracic
spine.

These results will be called to the ordering clinician or
representative by the Radiologist Assistant, and communication
documented in the PACS or [REDACTED].

## 2022-03-06 IMAGING — MR MR CERVICAL SPINE WO/W CM
7 of 10 series · 19 of 48 positions shown · IV contrast (gadavist)
Comparison: [DATE]

CLINICAL DATA: Epidural abscess

EXAM:
MRI CERVICAL AND THORACIC SPINE WITHOUT AND WITH CONTRAST
TECHNIQUE: Multiplanar and multiecho pulse sequences of the cervical spine, to
include the craniocervical junction and cervicothoracic junction,
and the thoracic spine, were obtained without and with intravenous
contrast.
CONTRAST:  9mL GADAVIST GADOBUTROL 1 MMOL/ML IV SOLN

[Series 3: T2 · sagittal · 3.0mm · 0.43mm/px · 2 of 18 slices shown (1 of 2)]
[im 1/18]
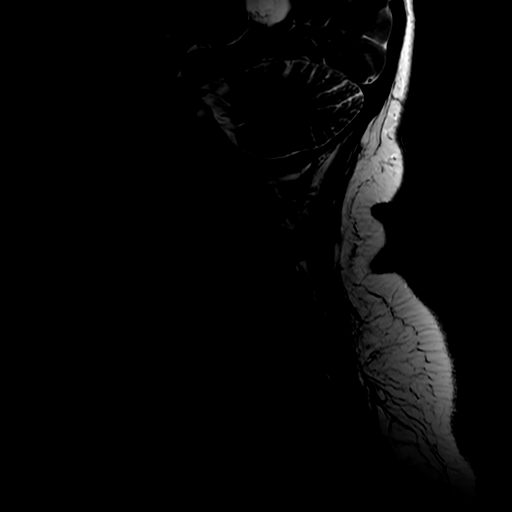
[im 18/18]
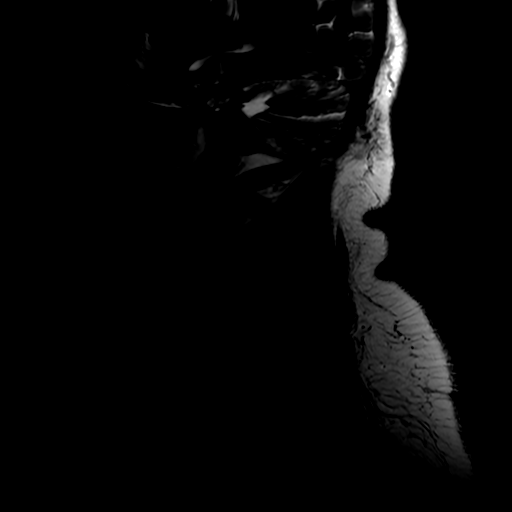

[Series 5: STIR · sagittal · 3.0mm · 0.43mm/px · 2 of 18 slices shown]
[im 1/18]
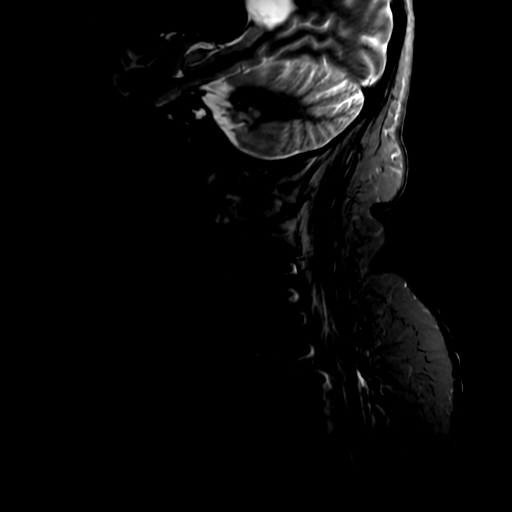
[im 18/18]
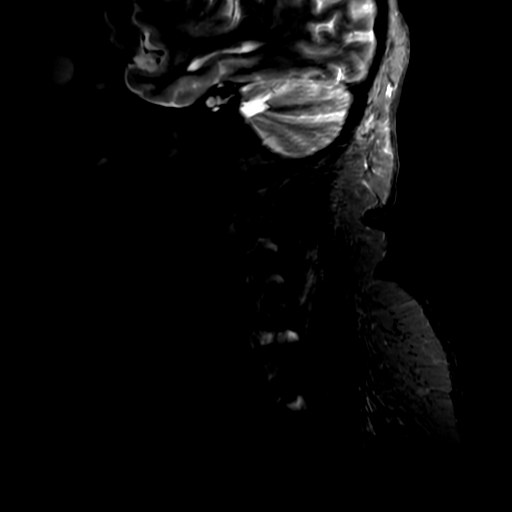

[Series 8: T1 · axial · non-contrast · 3.0mm · 0.35mm/px · z∈[-64,+46]mm · 4 of 34 slices shown (1 of 2)]
[im 1/34]
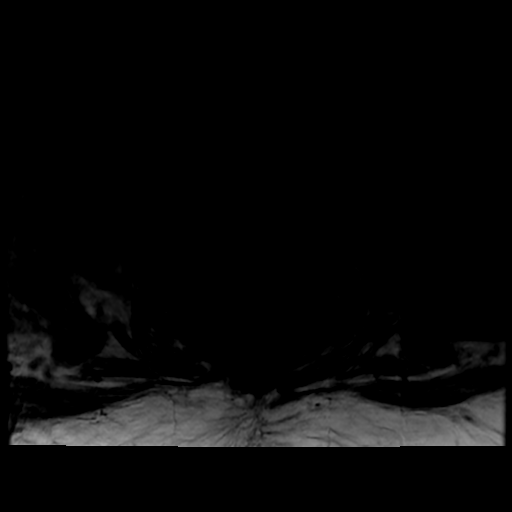
[im 12/34]
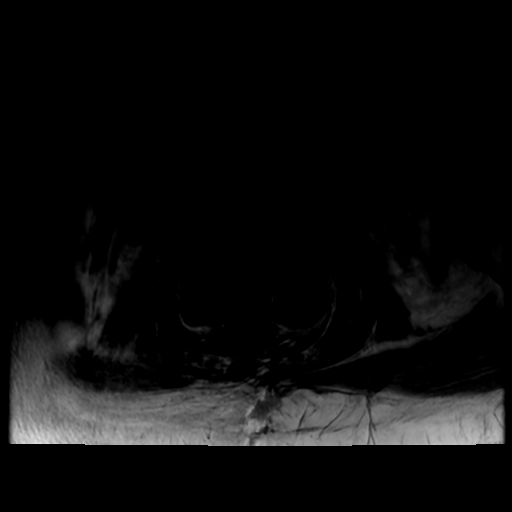
[im 23/34]
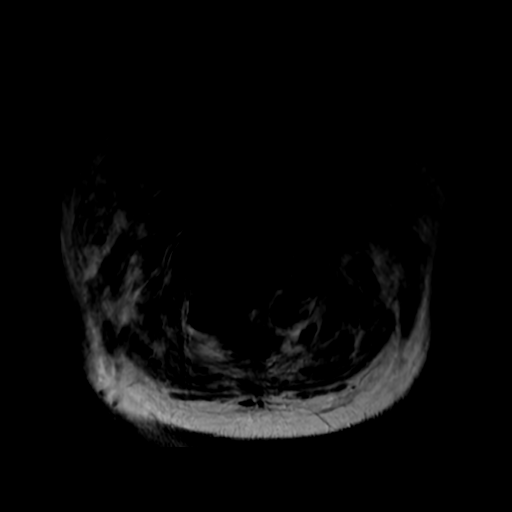
[im 34/34]
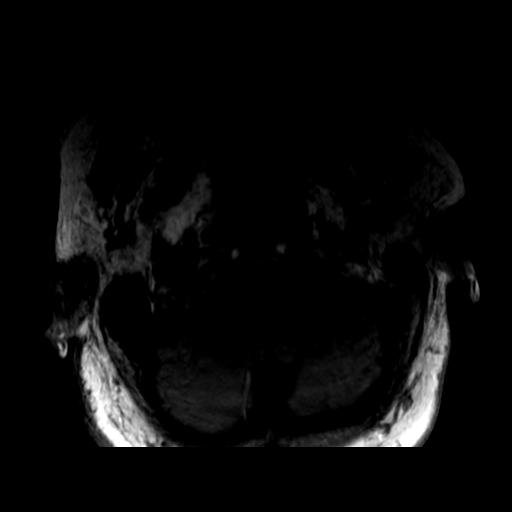

[Series 9: T2 · axial · 3.0mm · 0.35mm/px · z∈[-64,+46]mm · 4 of 34 slices shown (2 of 2)]
[im 1/34]
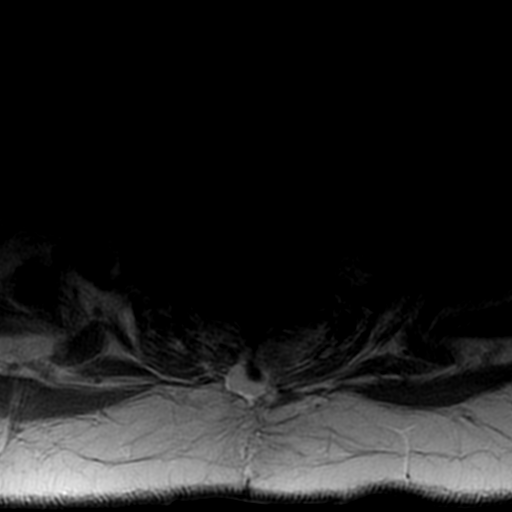
[im 12/34]
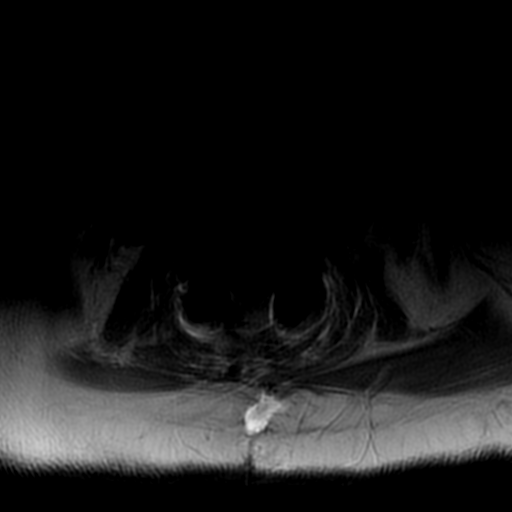
[im 23/34]
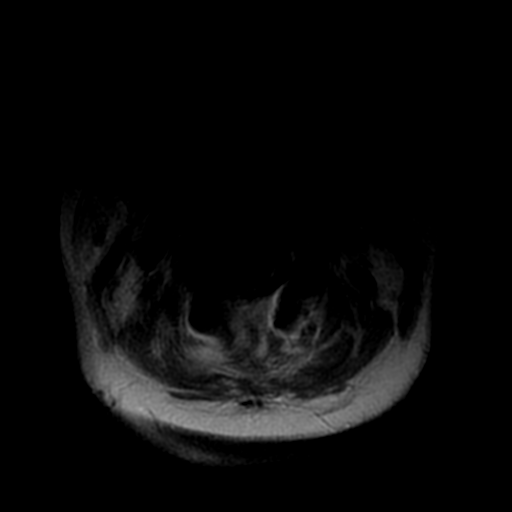
[im 34/34]
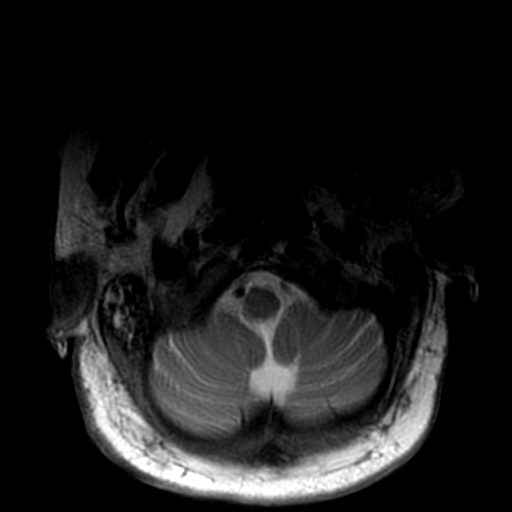

[Series 10: T1 · axial · 3.0mm · 0.70mm/px · z∈[-64,+46]mm · 4 of 34 slices shown (2 of 2)]
[im 1/34]
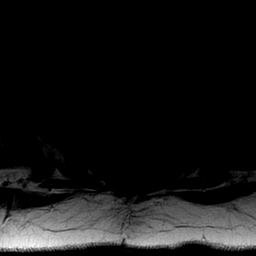
[im 12/34]
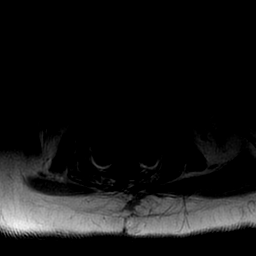
[im 23/34]
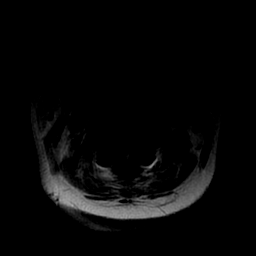
[im 34/34]
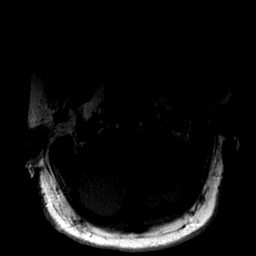

[Series 23: T1 fat-sat post-contrast · sagittal · 3.0mm · 0.43mm/px · 2 of 18 slices shown]
[im 1/18]
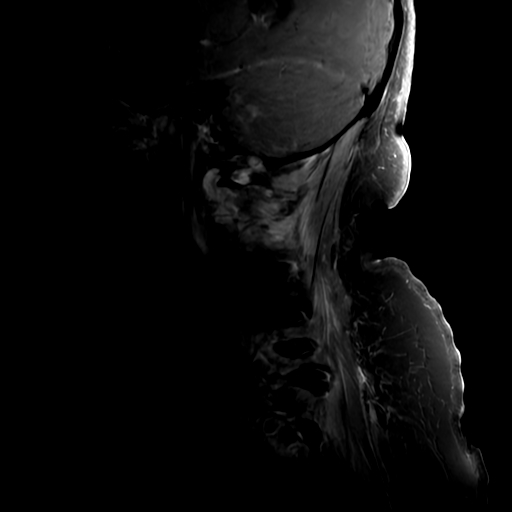
[im 18/18]
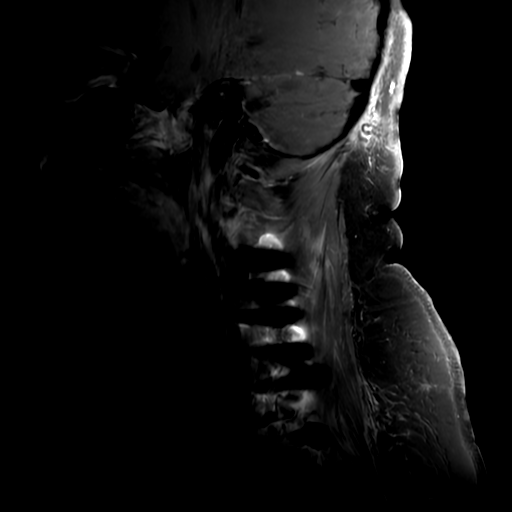

[Series 24: T1 post-contrast · axial · 3.0mm · 0.35mm/px · 1 of 34 slices shown]
[im 1/34]
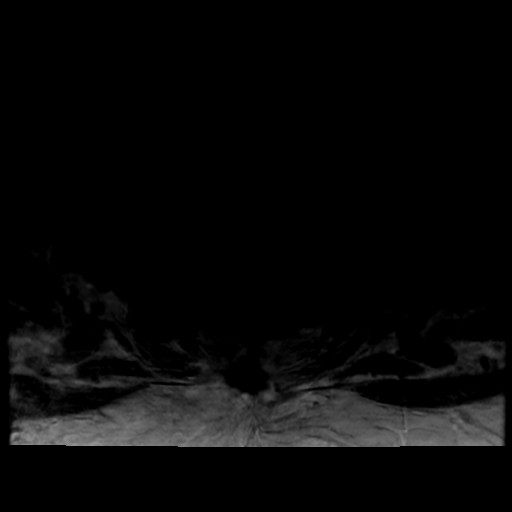

[19 of 48 positions shown; findings below may reference images not displayed]

FINDINGS: Evaluation is somewhat limited by motion artifact.

MRI CERVICAL SPINE FINDINGS

Alignment: Straightening of the normal cervical lordosis. No
significant listhesis.

Vertebrae: Status post interval posterior decompression and fixation
C3-C7. Susceptibility artifact from the hardware limits evaluation
at these levels. Increased T2 signal and enhancement about the C4-C5
and C5-C6 discs and endplates, without definite loss of cortical
signal.

Cord: Evaluation is limited by susceptibility. The imaged portions
of the cord are normal in signal and morphology. Previously noted
epidural abscess is no longer seen.

Posterior Fossa, vertebral arteries, paraspinal tissues: Extensive
edema in the soft tissues posterior to the cervical spine with a
small fluid collection in the subcutaneous fat (series 9, image 23).
Additional small fluid collection about the spinous process of T1
(series 9, image 34). These are not unexpected postoperatively. No
evidence of a deeper fluid collection near the thecal sac.

Enhancing lesions are seen in the cerebellum (series 23, image 7),
which were not on the prior exam, incompletely evaluated. Decreased
leptomeningeal enhancement about the base of the brain and posterior
fossa.

Disc levels:

C2-C3: No significant disc bulge. No spinal canal stenosis or
neuroforaminal narrowing.

C3-C4: Status post decompression. No spinal canal stenosis or neural
foraminal narrowing.

C4-C5: Disc height loss with disc osteophyte complex. Status post
decompression. No spinal canal stenosis or neural foraminal
narrowing.

C5-C6: Disc height loss with disc osteophyte complex. Status post
decompression. No spinal canal stenosis or definite neural foraminal
narrowing.

C6-C7: Disc height loss with disc osteophyte complex. Status post
decompression. No spinal canal stenosis. Moderate left neural
foraminal narrowing.

C7-T1: Small right paracentral disc protrusion. No spinal canal
stenosis or neural foraminal narrowing.

MRI THORACIC SPINE FINDINGS

Alignment:  Physiologic.

Vertebrae: Diffusely decreased marrow signal which is nonspecific
but can be seen with anemia, smoking, and obesity. No acute fracture
or suspicious osseous lesion.

Cord: Previously noted epidural collection is now not definitively
seen until the level of T11, where it is significantly decreased in
size, status post interval left hemilaminectomy and drainage at
T11-T12. The collection measures up to 5 mm at the level of T12,
previously 9 mm when remeasured similarly. The collection does
appear to extend off the inferior field of view into the lumbar
epidural space.

Paraspinal and other soft tissues: Postsurgical changes posterior to
T11 and T12. No evidence of significant fluid collection in the soft
tissues.

Disc levels:

No significant residual spinal canal stenosis in the thoracic spine.
IMPRESSION: 1. Evaluation is somewhat limited by motion artifact. Within this
limitation, there has been interval posterior decompression and
fixation at C3-C7 and left hemilaminectomy at T11-T12, with drainage
of the majority of the previously noted epidural abscess. Epidural
collection is now seen from the level of T11, extending off the
inferior field of view in the lumbar spine, decreased in size from
the prior exam, measuring up to 5 mm at the level of T12, previously
9 mm when remeasured similarly.
2. Enhancing lesions are noted in the imaged portion of the
cerebellum, which is incompletely evaluated. An MRI with and without
contrast is recommended for further evaluation.
3. Edema in the soft tissues posterior to the cervical spine with a
small fluid collection in the subcutaneous fat and about the spinous
process of T1, which are not unexpected postoperatively. No evidence
of a deeper fluid collection near the thecal sac or abscess.
4. Evaluation of the cervical spine is limited by susceptibility,
however there appears to be increased T2 signal and enhancement
about the C4-C5 and C5-C6 discs and endplates, without definite loss
cortical signal. This may be inflammatory or degenerative but could
also be seen with early discitis osteomyelitis. Attention on
follow-up.
5. No residual spinal canal stenosis in the cervical or thoracic
spine.

These results will be called to the ordering clinician or
representative by the Radiologist Assistant, and communication
documented in the PACS or [REDACTED].

## 2022-03-06 MED ORDER — INSULIN GLARGINE-YFGN 100 UNIT/ML ~~LOC~~ SOLN
28.0000 [IU] | Freq: Two times a day (BID) | SUBCUTANEOUS | Status: DC
  Administered 2022-03-06 – 2022-03-07 (×3): 28 [IU] via SUBCUTANEOUS
  Filled 2022-03-06 (×6): qty 0.28

## 2022-03-06 MED ORDER — POLYETHYLENE GLYCOL 3350 17 G PO PACK
17.0000 g | PACK | Freq: Every day | ORAL | Status: DC | PRN
Start: 2022-03-06 — End: 2022-03-21

## 2022-03-06 MED ORDER — DOCUSATE SODIUM 50 MG/5ML PO LIQD
100.0000 mg | Freq: Two times a day (BID) | ORAL | Status: DC | PRN
Start: 2022-03-06 — End: 2022-03-21

## 2022-03-06 NOTE — Progress Notes (Signed)
Patient ID: Gabrielle Vincent, female   DOB: 1966/03/12, 56 y.o.   MRN: [id number] Patient appears to be improving with her speech and her pulmonary status however I am told that she is still been having fevers as high as 102.  Her neurologic status has not improved much she still has a fairly substantial quadriplegia.  There is concern about some continued site of infection with undrained pus.  I believe the drainage of the epidural abscesses in her spinal canal is helpful but I am doubtful there is a persistent collection.  In that regard I had discussed and reviewed the films with Dr. Barnet Glasgow from neuroradiology.  I noted that we are still concerned about the potential for an undrained collection of pus.  He suggested a follow-up MRI of the cervical and thoracic spine may be of benefit to determine that.  He did however mention concern that the patient has a lesion in the right upper lobe that was concerning for cancer but may also represent an abscess.  For now I will order an MRI of the cervical and thoracic spine with and without contrast.

## 2022-03-06 NOTE — Progress Notes (Signed)
Notified Dr. Tacy Learn that patient's urine output is over 2 L for shift and net I/O is -420 for shift. MD to reorder serum Na+ for further assessment.

## 2022-03-06 NOTE — Progress Notes (Signed)
Notified patient of procedure. HOB less than 45*. Line pulled with exhale of breath, pressure held for 5 min with ns s/sx of bleeding at this time. Pressure drsg applied. To remain in place for 24hr. And remain in bed for 30 min. Nurse notified line was pulled. Fran Lowes, RN VAST

## 2022-03-06 NOTE — Progress Notes (Signed)
Inpatient Rehab Admissions Coordinator:   Pt was rescreened for CIR by Shann Medal, PT, DPT.  Pt continues to have low tolerance for mobility, only able to tolerate 19 min OT session at bed level and unable to participate in PT 2/2 fatigue yesterday.  If she is approaching readiness for next LOC would recommend lower intensity rehab.  We will continue to follow from the periphery, but pt does not appear to be a candidate at this time due to poor tolerance.   Shann Medal, PT, DPT Admissions Coordinator [phone] 03/06/22  9:39 AM

## 2022-03-06 NOTE — Progress Notes (Signed)
NAME:  Marcianna Daily, MRN:  [id number], DOB:  1966/06/05, LOS: 16 ADMISSION DATE:  02/14/2022, CONSULTATION DATE:  5/5 REFERRING MD:  Rai, CHIEF COMPLAINT:  acute encephalopathy and sepsis    History of Present Illness:  56 year old female who was admitted on 5/3 with chief complaint of severe low back pain radiating down her left leg.  Also had associated hematuria and dysuria.  She had no weakness although low did say pain impacted her ability to walk.  Also noted poor p.o. intake, decreased urination as well as urinary retention, hematuria, dysuria, and some nausea.   CT scan was negative for obstructive uropathy did show fatty liver infiltrates, there is cholelithiasis but no acute cholecystitis there is mild circumferential thickening of the distal esophagus global enlargement of the uterus with thickened appearance of the endometrial stripe with radiology recommending nonemergent pelvic ultrasound.  Diagnostic evaluation notable for new AKI with serum creatinine 3.64, acute transaminitis leukocytosis, and hyponatremia of 127 she was admitted for further evaluation, cultures were sent, and she was started on IV ceftriaxone.  On 5/3 patient was noted to be more confused, speech was slurred , Working diagnosis was #1 urinary tract infection with resultant sepsis and also low back pain for which neurosurgery was consulted as MRI finding showed acute herniated L3-L4 disc with left lumbar radiculopathy.  It was felt that pain control via lumbar injection may help in that this could be treated conservatively.  On 5/4 patient becoming intermittently agitated then lethargic, because of this a CT of head was obtained this was severely limited due to motion degraded meant but there was concern about hypodense area in the right basal ganglia section raising concern for acute infarct.  On 5/5 a rapid response was called the patient was severely agitated, pulled out IVs, pull out Foley catheter  Later that  afternoon mental status continued to worsen.  Neurology consult was obtained in addition to this infectious disease was consulted with urine growing E. coli and narrowed antibiotics to cefazolin stopped vancomycin and recommended supportive care because her mental status continued to decline, she had severe metabolic derangements, and we were unable to provide medical care on the [facility] she was transferred to the ICU for higher level of care.  On initial arrival she was agitated requiring several nurses to hold her down, tachypneic tachycardic would not follow commands had marked increased work of breathing.  An intraosseous line was placed in the right lower extremity, she was intubated for airway protection to facilitate further MRI imaging, and a right IJ triple-lumen catheter was placed  Pertinent  Medical History  Class II obesity, hepatic steatosis, seasonal allergies, varicose veins diverticulosis, HL   Significant Hospital Events: Including procedures, antibiotic start and stop dates in addition to other pertinent events   5/3 admitted with back pain and urinary tract infection , Further complicated by AKI hyponatremia and multiple metabolic derangements.   CT imaging for renal stones showed no acute abdominal pelvic findings there was no obstructive uropathy there was severe fatty liver disease there was cholelithiasis but no cholecystitis there is colonic diverticulosis but no diverticulitis, mild circumferential thickening of the esophagus globular enlargement of the uterus with radiology recommending nonemergent pelvic ultrasound found to have uterine fibroid abdominal Ultrasound showed fatty liver but was epididymides negative.   MRI of the lumbar spine showed small left subarticular disc extrusion with inferior migration at L3-L4 correlating with radiculopathy.  She was started on ceftriaxone cultures were sent 5/4 increased confusion CT  brain obtained raising concern for possible acute  infarct in the right basal ganglia 5/5 progressive encephalopathy , Worsening leukocytosis, hypernatremia, hyperchloremia, slowly improving renal function, slowly improving procalcitonin, moved to ICU due to delirium, intubated for airway protection and to facilitate MRI imaging right IJ central line placed due to limited IV access.  Seen by neurology, also seen by infectious disease with ceftriaxone changed to cefazolin 5/6 antibiotics changed to meningitis coverage given MRI findings of fluid in the ventricles.  Image guided LP ordered 5/8 LP done with purulent CSF, studies consistent with bacterial meningitis, culture growing gm + cocci 5/10 significant rise in sodium with massive urine output of greater than 7 L leading to concern for development of central DI.  DDAVP and volume resuscitation started 5/11 hypernatremia slowly downtrending, continue DDAVP and IV resuscitation per nephrology 5/12 Sodium downtrended to 147 this am 5/13 to OR for posterior cervical decompressive laminectomy and medial facetectomy C3-T1 for evacuation of epirudal abscess, posterior cervical arthrodesis C3-C7, segmental fixation C3-7 5/18 OR for thoacolumbar laminectomy and decompression of epidural abscess from T-L spine 5/19 extubated to [place]  Interim History / Subjective:  Patient started spiking fever again with Tmax 101.1 Nasal cannula oxygen was discontinued, currently on room air tolerating well  Objective   Blood pressure 126/63, pulse (!) 109, temperature 100.1 F (37.8 C), temperature source Axillary, resp. rate 18, height '5\' 4"'$  (1.626 m), weight 94.5 kg, SpO2 93 %.        Intake/Output Summary (Last 24 hours) at 03/06/2022 1030 Last data filed at 03/06/2022 4580 Gross per 24 hour  Intake 3937.6 ml  Output 3275 ml  Net 662.6 ml   Filed Weights   02/28/22 0315 03/02/22 0500 03/03/22 0424  Weight: 102.6 kg 94.5 kg 94.5 kg   Physical exam: General: Crtitically ill-appearing female, lying on the  bed HEENT: [place]/AT, eyes anicteric.  Cortrak in place Neuro: Awake, following commands, antigravity in bilateral upper extremities at the elbow joint, no movement to painful stimuli in lower extremities Chest: Coarse breath sounds, no wheezes or rhonchi Heart: Regular rate and rhythm, no murmurs or gallops Abdomen: Soft, nontender, nondistended, bowel sounds present Skin: No rash  Assessment & Plan:  Principal Problem:   Abscess in epidural space of cervical spine Active Problems:   Sepsis secondary to UTI (HCC)   Hyponatremia   Hypokalemia   AKI (acute kidney injury) ([place])   Abnormal LFTs   Class II obesity   Hepatic steatosis   Cholelithiasis   Esophageal thickening   Acute low back pain   Encephalopathy acute   Acute respiratory failure (HCC)   Radiculopathy of lumbar region   E. coli UTI   Meningitis   Bacterial meningitis   Diabetes insipidus ([place])   H/O excision of lamina of cervical vertebra for decompression of spinal cord   Acute pyelonephritis   E coli bacteremia   Septic shock secondary to E. Coli- UTI , spinal abscess, and meningitis Acute bacterial meningitis with E. Coli due to direct extension from epidural abscess Extensive epidural abscess extending from foramen magnum to the sacral spine s/p C3-T1 laminectomy and evacuation of abscess, s/p thoracolumbar laminectomy and decompression of epidural abcess from thoracolumbar spine on 5/18. 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 Shock has resolved, patient remained off vasopressors She continued to spike fever Appreciate ID follow-up Continue IV ceftriaxone Defer to neurosurgery for repeating imaging considering patient is persistently spiking fever Continue midodrine  Acute septic encephalopathy, resolved  Central DI  - presumed  2/2 meningitis, improving with DDAVP Hypernatremia Hypokalemia, resolved Con't DDAVP 4 times daily; try to wean again in a few days Serum sodium is trending down now at 150   Continue free water flushes Continue aggressive electrolyte supplement  AKI due to septic ATN, resolved NAGMA - resolved  Anasarca with hypervolemia +7L net Serum creatinine is slowly improving Continue free water flushes  Acute hypoxic respiratory failure Difficult airway-- anterior and had glottic edema Respiratory status remains tenuous; needs con't ICU monitoring and RT care. Remains high risk for reintubation She may also require further neurosurgical procedures considering remained febrile, despite being on antibiotics NTS PRN CPT -supplemental O2 to maintain SPO@ >90% If reintubated will need trach-- has already been intubated about 2 weeks.   Shock liver superimposed on known fatty liver disease - improved  Anemia of critical illness Transfuse for Hb<7 or hemodynamically significant bleeding -monitor  Poorly controlled diabetes type 2 HbA1c 8.9. Blood sugars are better controlled now Decrease Lantus to 28 units twice daily Continue sliding scale with CBG goal 140-180  RUL nodule, concerning for primary lung cancer If she continues to progress she will need a chest CT to better evaluate this.  Respiratory status remains too tenuous to go to radiology for now  Best Practice (right click and "Reselect all SmartList Selections" daily)   Diet/type: TF DVT prophylaxis: prophylactic heparin  GI prophylaxis: PPI Lines: PICC placed 5/15 Foley:  Yes, and it is still needed- DI Code Status:  full code Last date of multidisciplinary goals of care discussion: 5/19, IPAL note dictated 5/10  Critical care time:    Total critical care time: 34 minutes  Performed by: Jacky Kindle   Critical care time was exclusive of separately billable procedures and treating other patients.   Critical care was necessary to treat or prevent imminent or life-threatening deterioration.   Critical care was time spent personally by me on the following activities: development of treatment  plan with patient and/or surrogate as well as nursing, discussions with consultants, evaluation of patient's response to treatment, examination of patient, obtaining history from patient or surrogate, ordering and performing treatments and interventions, ordering and review of laboratory studies, ordering and review of radiographic studies, pulse oximetry and re-evaluation of patient's condition.   Jacky Kindle MD [facility] See Amion for pager If no response to pager, please call [phone] until 7pm After 7pm, Please call E-link [phone]

## 2022-03-06 NOTE — Progress Notes (Signed)
Speech Language Pathology Treatment: Dysphagia  Patient Details Name: Gabrielle Vincent MRN: [id number] DOB: 1966-01-24 Today's Date: 03/06/2022 Time: 9741-6384 SLP Time Calculation (min) (ACUTE ONLY): 10 min  Assessment / Plan / Recommendation Clinical Impression  F/u for dysphagia - pt extremely weak, output is aphonic despite her efforts to achieve voice.  HOB elevated slightly; provided with ice chips and 1/2 teaspoons water, all of which elicited weak, consistent coughing.  Pt is not ready for any PO intake given deconditioning, likely glottal insufficiency and positive s/s of aspiration.  Will hold on instrumental testing until bedside assessment shows some improvement.  Will follow.    HPI HPI: 56 y.o. female presents 02/14/2022 with severe back pain associated with hematuria and dysuria. Found to have UTI and  AKI. MRI demonstrated small herniated nucleus pulposus at L3-L4. 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 Cultures positive for meningitis. MRI spine demonstrates diffuse epidural abscess with cord compression and likely signal change. Pt underwent C3-T1 decompressive laminectomy with epidural abscess evaucation along with C3-7 fusion on 5/13. MRI 5/16 demonstrates extensive thoracic epidural abscess. 5/18- Pt underwent thoracolumbar laminectomy and decompression of epidural abscess. Extubated 5/19. PMH includes obesity, HLD, diverticulosis.      SLP Plan  Continue with current plan of care      Recommendations for follow up therapy are one component of a multi-disciplinary discharge planning process, led by the attending physician.  Recommendations may be updated based on patient status, additional functional criteria and insurance authorization.    Recommendations  Diet recommendations: NPO Medication Administration: Via alternative means                Oral Care Recommendations: Oral  care QID Follow Up Recommendations: Other (comment) Assistance recommended at discharge:  (tba) SLP Visit Diagnosis: Dysphagia, unspecified (R13.10) Plan: Continue with current plan of care         Larinda Herter L. Tivis Ringer, [place] Office number [phone] Pager [phone]   [name]  03/06/2022, 9:58 AM

## 2022-03-06 NOTE — Progress Notes (Signed)
Physical Therapy Treatment Patient Details Name: Gabrielle Vincent MRN: [id number] DOB: 1966-04-04 Today's Date: 03/06/2022   History of Present Illness 56 y.o. female presents to [facility] hospital on 02/14/2022 with complaints of severe back pain associated with hematuria and dysuria. Pt admitted for management of UTI and AKI. MRI demonstrates small herniated nucleus pulposus at L3-L4 on the left side. 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 Pt intubated 02/16/2022. Cultures positive for meningitis. MRI 02/23/2022 demonstrates diffuse epidural abscess with cord compression and likely signal change. Pt underwent C3-T1 decompressive laminectomy with epidural abscess evaucation along with C3-7 fusion on 5/13. MRI 5/16 demonstrates Extensive thoracic epidural abscess. 5/18- Pt underwent Thoracolumbar laminectomy and decompression of epidural abscess. Extubated 5/19. PMH includes obesity, HLD, diverticulosis.    PT Comments    Pt admitted with above diagnosis. Pt was able to sit in chair position with back unsupported and min assist with bil UE support.  Pt alert and trying hard. Pt emotional as she seemed to realize her deficits.  Will continue to follow pt.  Pt currently with functional limitations due to balance and endurance deficits. Pt will benefit from skilled PT to increase their independence and safety with mobility to allow discharge to the venue listed below.      Recommendations for follow up therapy are one component of a multi-disciplinary discharge planning process, led by the attending physician.  Recommendations may be updated based on patient status, additional functional criteria and insurance authorization.  Follow Up Recommendations  Acute inpatient rehab (3hours/day) (will wait for AIR consult pending pt's tolerance next session)     Assistance Recommended at Discharge Frequent or constant Supervision/Assistance  Patient can return home with the following Two  people to help with walking and/or transfers;Two people to help with bathing/dressing/bathroom;Assistance with cooking/housework;Assistance with feeding;Assist for transportation;Help with stairs or ramp for entrance;Direct supervision/assist for medications management;Direct supervision/assist for financial management   Equipment Recommendations  Wheelchair (measurements PT);Wheelchair cushion (measurements PT);Hospital bed (hoyer lift)    Recommendations for Other Services       Precautions / Restrictions Precautions Precautions: Fall;Back Precaution Booklet Issued: No Precaution Comments: Reviewed precautions Required Braces or Orthoses:  (no brace needed per orders) Restrictions Weight Bearing Restrictions: No     Mobility  Bed Mobility Overal bed mobility: Needs Assistance Bed Mobility: Rolling Rolling: +2 for physical assistance, Total assist         General bed mobility comments: A little assist to roll but less than 10% due to bil UE and LE weakness.    Transfers Overall transfer level: Needs assistance                 General transfer comment: Inclined bed to full chair position and intiially had pt pull on rails and max assist for trunk to sit with back unsupported. Pt wasnt comfortable with hands on rails due to stretch and weakness therefore placed hands on LEs and pt was able to prop using UEs on LEs and beside LEs and was able to sit with min guard to min assist for up to 6 minutes with cues for posture. Pt with flexed neck and was able to get pt to actively extend trunk and neck a little.  Tried to provide stretch as well.  No active movement noted or ilicited in LEs.  UEs with 2-/5 movement    Ambulation/Gait               General Gait Details:  unable   Stairs             Wheelchair Mobility    Modified Rankin (Stroke Patients Only)       Balance Overall balance assessment: Needs assistance Sitting-balance support: Bilateral upper  extremity supported, Feet supported Sitting balance-Leahy Scale: Poor Sitting balance - Comments: Pt requires use of UEs but can sit with back unsupported and propping on LEs.                                    Cognition Arousal/Alertness: Awake/alert Behavior During Therapy: Anxious, Flat affect Overall Cognitive Status: Difficult to assess                                 General Comments: following most commands to complete MMT/ROM, pt mouthing words almost the entire session, no productive speech noted. Asked pt to shake head yes/no to questions which she completed accurately ~75% of the time        Exercises      General Comments        Pertinent Vitals/Pain Pain Assessment Pain Assessment: Faces Faces Pain Scale: Hurts little more Pain Location: BUEs with ROM Pain Descriptors / Indicators: Grimacing Pain Intervention(s): Limited activity within patient's tolerance, Monitored during session, Repositioned    Home Living                          Prior Function            PT Goals (current goals can now be found in the care plan section) Acute Rehab PT Goals Patient Stated Goal: to gain strength in extremities and move better Progress towards PT goals: Progressing toward goals    Frequency    Min 4X/week      PT Plan Current plan remains appropriate    Co-evaluation              AM-PAC PT "6 Clicks" Mobility   Outcome Measure  Help needed turning from your back to your side while in a flat bed without using bedrails?: Total Help needed moving from lying on your back to sitting on the side of a flat bed without using bedrails?: Total Help needed moving to and from a bed to a chair (including a wheelchair)?: Total Help needed standing up from a chair using your arms (e.g., wheelchair or bedside chair)?: Total Help needed to walk in hospital room?: Total Help needed climbing 3-5 steps with a railing? : Total 6  Click Score: 6    End of Session Equipment Utilized During Treatment: Oxygen Activity Tolerance: Patient limited by pain;Patient limited by fatigue Patient left: in bed;with call bell/phone within reach;with bed alarm set (chair in 50 degree chair position) Nurse Communication: Mobility status;Need for lift equipment PT Visit Diagnosis: Other abnormalities of gait and mobility (R26.89);Other symptoms and signs involving the nervous system (R29.898);Pain Pain - part of body:  (back, LLE)     Time: 1040-1100 PT Time Calculation (min) (ACUTE ONLY): 20 min  Charges:  $Therapeutic Activity: 8-22 mins                     Shyleigh Daughtry M,PT Acute Rehab Services [phone] (pager)    Alvira Philips 03/06/2022, 12:39 PM

## 2022-03-06 NOTE — Plan of Care (Signed)
Patient remains in [place] ICU room 24. Room air. Cortrak, FMS, and Foley remain in place. Plan for MRI tonight.   Problem: Education: Goal: Knowledge of General Education information will improve Description: Including pain rating scale, medication(s)/side effects and non-pharmacologic comfort measures Outcome: Progressing   Problem: Health Behavior/Discharge Planning: Goal: Ability to manage health-related needs will improve Outcome: Progressing   Problem: Clinical Measurements: Goal: Ability to maintain clinical measurements within normal limits will improve Outcome: Progressing Goal: Will remain free from infection Outcome: Progressing Goal: Diagnostic test results will improve Outcome: Progressing Goal: Respiratory complications will improve Outcome: Progressing Goal: Cardiovascular complication will be avoided Outcome: Progressing   Problem: Activity: Goal: Risk for activity intolerance will decrease Outcome: Progressing   Problem: Nutrition: Goal: Adequate nutrition will be maintained Outcome: Progressing   Problem: Coping: Goal: Level of anxiety will decrease Outcome: Progressing   Problem: Elimination: Goal: Will not experience complications related to bowel motility Outcome: Progressing Goal: Will not experience complications related to urinary retention Outcome: Progressing   Problem: Pain Managment: Goal: General experience of comfort will improve Outcome: Progressing   Problem: Safety: Goal: Ability to remain free from injury will improve Outcome: Progressing   Problem: Skin Integrity: Goal: Risk for impaired skin integrity will decrease Outcome: Progressing   Problem: Urinary Elimination: Goal: Signs and symptoms of infection will decrease Outcome: Progressing

## 2022-03-06 NOTE — Progress Notes (Signed)
[facility]  Date of Admission:  02/14/2022           Reason for visit: Follow up on E coli epidural abscess   Current antibiotics: Ceftriaxone  ASSESSMENT:    56 y.o. female admitted with:  E coli complicated UTI with extensive epidural abscess extending from lumbar to cervical spine with associated meningitis: Status post cervical decompression and instrumentation C3-7 on 5/13 and thoracolumbar laminectomy and decompression epidural abscess on 5/18. Fevers: Continues to be febrile over the last 24 hours.  Repeat blood cx NGTD.  PICC line in place since 5/15. Foley in place. Newly diagnosed DM: A1c is 8.9. Right upper lobe 2.2cm spiculating mass: Concerning for malignancy.  Per CCM.    RECOMMENDATIONS:    Continue ceftriaxone 2gm BID Follow blood cx Consider repeat spinal imaging given that fevers persist to see if anything further can be decompressed Would also consider removal of PICC line if able Appreciate NSGY and PCCM Will follow   Principal Problem:   Abscess in epidural space of cervical spine Active Problems:   Sepsis secondary to UTI ([place])   Hyponatremia   Hypokalemia   AKI (acute kidney injury) ([place])   Abnormal LFTs   Class II obesity   Hepatic steatosis   Cholelithiasis   Esophageal thickening   Acute low back pain   Encephalopathy acute   Acute respiratory failure ([facility])   Radiculopathy of lumbar region   E. coli UTI   Meningitis   Bacterial meningitis   Diabetes insipidus ([place])   H/O excision of lamina of cervical vertebra for decompression of spinal cord   Acute pyelonephritis   E coli bacteremia    MEDICATIONS:    Scheduled Meds: . chlorhexidine gluconate (MEDLINE KIT)  15 mL Mouth Rinse BID  . Chlorhexidine Gluconate Cloth  6 each Topical Daily  . desmopressin  0.05 mg Per Tube QID  . docusate  100 mg Per Tube BID  . feeding supplement (PROSource TF)  90 mL Per Tube BID  . free water  300 mL Per Tube Q2H   . heparin injection (subcutaneous)  5,000 Units Subcutaneous Q8H  . insulin aspart  0-15 Units Subcutaneous Q4H  . insulin aspart  8 Units Subcutaneous Q4H  . insulin glargine-yfgn  28 Units Subcutaneous BID  . mouth rinse  15 mL Mouth Rinse 10 times per day  . midodrine  10 mg Per Tube TID WC  . pantoprazole sodium  40 mg Per Tube Q1400  . polyethylene glycol  17 g Per Tube Daily  . senna  1 tablet Per Tube BID  . sodium chloride flush  10-40 mL Intracatheter Q12H  . sodium chloride flush  3 mL Intravenous Q12H  . zinc oxide   Topical BID   Continuous Infusions: . sodium chloride 10 mL/hr at 03/06/22 0600  . cefTRIAXone (ROCEPHIN)  IV Stopped (03/05/22 2157)  . feeding supplement (JEVITY 1.5 CAL/FIBER) 1,000 mL (03/05/22 2135)   PRN Meds:.acetaminophen (TYLENOL) oral liquid 160 mg/5 mL, acetaminophen **OR** acetaminophen, bisacodyl, fentaNYL (SUBLIMAZE) injection, lip balm, menthol-cetylpyridinium **OR** phenol, [DISCONTINUED] ondansetron **OR** ondansetron (ZOFRAN) IV, ondansetron **OR** [DISCONTINUED] ondansetron (ZOFRAN) IV, oxyCODONE, polyethylene glycol, sodium chloride flush, sodium chloride flush  SUBJECTIVE:   24 hour events:  Fevers continue WBC up a little bit Blood cx negative  She is able to nod her head yes/no to questions.   She is moving her upper extremities some.  Review of Systems  All other systems  reviewed and are negative.    OBJECTIVE:   Blood pressure 126/63, pulse (!) 109, temperature 100.1 F (37.8 C), temperature source Axillary, resp. rate 18, height 5' 4"  (1.626 m), weight 94.5 kg, SpO2 93 %. Body mass index is 35.76 kg/m.  Physical Exam Constitutional:      Appearance: Normal appearance.  HENT:     Head: Normocephalic and atraumatic.  Eyes:     Extraocular Movements: Extraocular movements intact.     Conjunctiva/sclera: Conjunctivae normal.  Cardiovascular:     Rate and Rhythm: Normal rate and regular rhythm.  Pulmonary:     Effort:  Pulmonary effort is normal. No respiratory distress.  Abdominal:     General: There is no distension.     Palpations: Abdomen is soft.     Tenderness: There is no abdominal tenderness.  Genitourinary:    Comments: Foley in place. Musculoskeletal:     Right lower leg: Edema present.     Left lower leg: Edema present.  Neurological:     General: No focal deficit present.     Mental Status: She is alert and oriented to person, place, and time.     Comments: She has movement in UE but none in the legs.  Psychiatric:        Mood and Affect: Mood normal.        Behavior: Behavior normal.     Lab Results: Lab Results  Component Value Date   WBC 11.5 (H) 03/06/2022   HGB 8.7 (L) 03/06/2022   HCT 28.5 (L) 03/06/2022   MCV 96.9 03/06/2022   PLT 251 03/06/2022    Lab Results  Component Value Date   NA 150 (H) 03/06/2022   K 3.9 03/06/2022   CO2 29 03/06/2022   GLUCOSE 96 03/06/2022   BUN 29 (H) 03/06/2022   CREATININE 0.93 03/06/2022   CALCIUM 8.0 (L) 03/06/2022   GFRNONAA >60 03/06/2022    Lab Results  Component Value Date   ALT 15 03/03/2022   AST 18 03/03/2022   ALKPHOS 90 03/03/2022   BILITOT 0.7 03/03/2022    No results found for: CRP  No results found for: ESRSEDRATE   I have reviewed the micro and lab results in Epic.  Imaging: DG Abd Portable 1V  Result Date: 03/05/2022 CLINICAL DATA:  Feeding tube placement EXAM: PORTABLE ABDOMEN - 1 VIEW COMPARISON:  None Available. FINDINGS: Feeding tube with tip in the distal stomach at the level of the pylorus. IMPRESSION: Feeding tube in distal stomach.  Stylet removed. Electronically Signed   By: Suzy Bouchard M.D.   On: 03/05/2022 10:36     Imaging independently reviewed in Epic.    [facility] [phone] pager 03/06/2022, 9:30 AM   I have personally spent 50 minutes involved in face-to-face and non-face-to-face activities for this  patient on the day of the visit. Professional time spent includes the following activities: Preparing to see the patient (review of tests), Obtaining and/or reviewing separately obtained history (admission/discharge record), Performing a medically appropriate examination and/or evaluation , Ordering medications/tests/procedures, referring and communicating with other health care professionals, Documenting clinical information in the EMR, Independently interpreting results (not separately reported), Communicating results to the patient/family/caregiver, Counseling and educating the patient/family/caregiver and Care coordination (not separately reported).

## 2022-03-07 ENCOUNTER — Inpatient Hospital Stay (HOSPITAL_COMMUNITY): Payer: BC Managed Care – PPO

## 2022-03-07 DIAGNOSIS — A419 Sepsis, unspecified organism: Secondary | ICD-10-CM | POA: Diagnosis not present

## 2022-03-07 DIAGNOSIS — G009 Bacterial meningitis, unspecified: Secondary | ICD-10-CM | POA: Diagnosis not present

## 2022-03-07 DIAGNOSIS — N179 Acute kidney failure, unspecified: Secondary | ICD-10-CM | POA: Diagnosis not present

## 2022-03-07 DIAGNOSIS — J9601 Acute respiratory failure with hypoxia: Secondary | ICD-10-CM | POA: Diagnosis not present

## 2022-03-07 DIAGNOSIS — R911 Solitary pulmonary nodule: Secondary | ICD-10-CM

## 2022-03-07 DIAGNOSIS — G061 Intraspinal abscess and granuloma: Secondary | ICD-10-CM | POA: Diagnosis not present

## 2022-03-07 DIAGNOSIS — N10 Acute tubulo-interstitial nephritis: Secondary | ICD-10-CM | POA: Diagnosis not present

## 2022-03-07 DIAGNOSIS — E232 Diabetes insipidus: Secondary | ICD-10-CM | POA: Diagnosis not present

## 2022-03-07 DIAGNOSIS — N390 Urinary tract infection, site not specified: Secondary | ICD-10-CM | POA: Diagnosis not present

## 2022-03-07 LAB — POCT I-STAT 7, (LYTES, BLD GAS, ICA,H+H)
Acid-Base Excess: 6 mmol/L — ABNORMAL HIGH (ref 0.0–2.0)
Bicarbonate: 31.3 mmol/L — ABNORMAL HIGH (ref 20.0–28.0)
Calcium, Ion: 1.24 mmol/L (ref 1.15–1.40)
HCT: 30 % — ABNORMAL LOW (ref 36.0–46.0)
Hemoglobin: 10.2 g/dL — ABNORMAL LOW (ref 12.0–15.0)
O2 Saturation: 100 %
Patient temperature: 103
Potassium: 4.4 mmol/L (ref 3.5–5.1)
Sodium: 156 mmol/L — ABNORMAL HIGH (ref 135–145)
TCO2: 33 mmol/L — ABNORMAL HIGH (ref 22–32)
pCO2 arterial: 56.5 mmHg — ABNORMAL HIGH (ref 32–48)
pH, Arterial: 7.362 (ref 7.35–7.45)
pO2, Arterial: 320 mmHg — ABNORMAL HIGH (ref 83–108)

## 2022-03-07 LAB — BASIC METABOLIC PANEL
Anion gap: 7 (ref 5–15)
BUN: 25 mg/dL — ABNORMAL HIGH (ref 6–20)
CO2: 27 mmol/L (ref 22–32)
Calcium: 8.4 mg/dL — ABNORMAL LOW (ref 8.9–10.3)
Chloride: 113 mmol/L — ABNORMAL HIGH (ref 98–111)
Creatinine, Ser: 0.96 mg/dL (ref 0.44–1.00)
GFR, Estimated: >60 mL/min (ref >60)
Glucose, Bld: 133 mg/dL — ABNORMAL HIGH (ref 70–99)
Potassium: 3.6 mmol/L (ref 3.5–5.1)
Sodium: 147 mmol/L — ABNORMAL HIGH (ref 135–145)

## 2022-03-07 LAB — GLUCOSE, CAPILLARY
Glucose-Capillary: 118 mg/dL — ABNORMAL HIGH (ref 70–99)
Glucose-Capillary: 123 mg/dL — ABNORMAL HIGH (ref 70–99)
Glucose-Capillary: 132 mg/dL — ABNORMAL HIGH (ref 70–99)
Glucose-Capillary: 135 mg/dL — ABNORMAL HIGH (ref 70–99)
Glucose-Capillary: 147 mg/dL — ABNORMAL HIGH (ref 70–99)
Glucose-Capillary: 159 mg/dL — ABNORMAL HIGH (ref 70–99)
Glucose-Capillary: 160 mg/dL — ABNORMAL HIGH (ref 70–99)

## 2022-03-07 IMAGING — DX DG CHEST 1V PORT
1 series · 1 of 1 positions shown · non-contrast
Comparison: [DATE]

CLINICAL DATA: Status post intubation.

EXAM:
PORTABLE CHEST 1 VIEW

[chest ap]
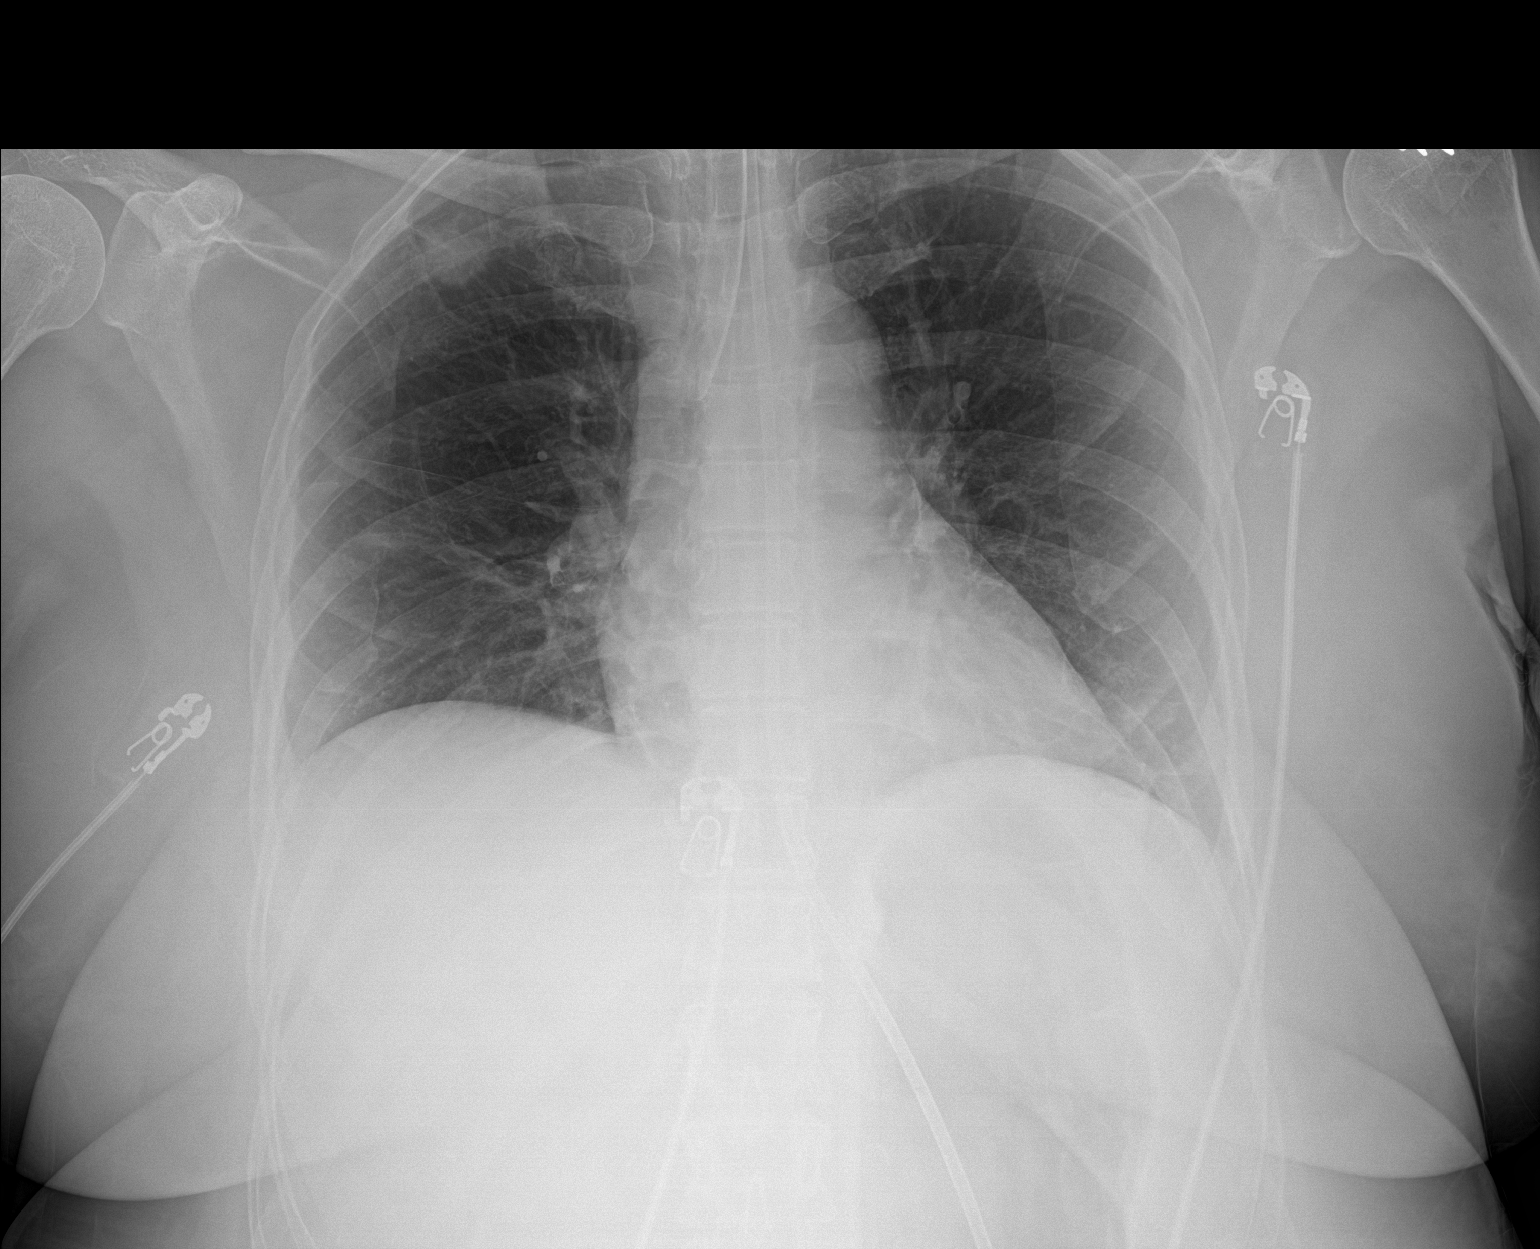

[1 of 1 positions shown; findings below may reference images not displayed]

FINDINGS: The endotracheal tube tip is directed towards the right mainstem
bronchus. Consider withdrawing by approximately 1.5-2 cm. There is a
feeding tube with tip well below the level of the GE junction. Heart
size and mediastinal contours are normal. Right upper lobe pulmonary
nodule with spiculated margins is identified. Previously obscured by
overlying cardiac leads. This measures approximately 1.4 cm.
IMPRESSION: 1. Endotracheal tube tip is directed towards the right mainstem
bronchus. Consider withdrawing by approximately 1.5-2 cm.
2. Persistent right upper lobe pulmonary nodule, previously obscured
by overlying cardiac leads but noted on previous thoracic MRI.
Recommend further evaluation with CT of the chest.
3. These results will be called to the ordering clinician or
representative by the Radiologist Assistant, and communication
documented in the PACS or [REDACTED].

## 2022-03-07 MED ORDER — NOREPINEPHRINE 4 MG/250ML-% IV SOLN
2.0000 ug/min | INTRAVENOUS | Status: DC
  Administered 2022-03-07: 2 ug/min via INTRAVENOUS
  Administered 2022-03-08: 5 ug/min via INTRAVENOUS
  Administered 2022-03-09: 3 ug/min via INTRAVENOUS
  Administered 2022-03-10: 2 ug/min via INTRAVENOUS
  Filled 2022-03-07 (×4): qty 250

## 2022-03-07 MED ORDER — DEXMEDETOMIDINE HCL IN NACL 400 MCG/100ML IV SOLN
0.0000 ug/kg/h | INTRAVENOUS | Status: AC
  Administered 2022-03-08: 0.7 ug/kg/h via INTRAVENOUS
  Administered 2022-03-08: 0.6 ug/kg/h via INTRAVENOUS
  Administered 2022-03-08: 0.4 ug/kg/h via INTRAVENOUS
  Administered 2022-03-09 (×3): 0.6 ug/kg/h via INTRAVENOUS
  Administered 2022-03-09: 0.5 ug/kg/h via INTRAVENOUS
  Filled 2022-03-07 (×8): qty 100

## 2022-03-07 MED ORDER — FENTANYL CITRATE PF 50 MCG/ML IJ SOSY
PREFILLED_SYRINGE | INTRAMUSCULAR | Status: DC
Start: 2022-03-07 — End: 2022-03-07
  Filled 2022-03-07: qty 2

## 2022-03-07 MED ORDER — ETOMIDATE 2 MG/ML IV SOLN
INTRAVENOUS | Status: AC
  Administered 2022-03-07: 20 mg
  Filled 2022-03-07: qty 20

## 2022-03-07 MED ORDER — GADOBUTROL 1 MMOL/ML IV SOLN
9.0000 mL | Freq: Once | INTRAVENOUS | Status: AC | PRN
  Administered 2022-03-07: 9 mL via INTRAVENOUS

## 2022-03-07 MED ORDER — ACETAMINOPHEN 10 MG/ML IV SOLN
1000.0000 mg | Freq: Once | INTRAVENOUS | Status: AC
  Administered 2022-03-07: 1000 mg via INTRAVENOUS
  Filled 2022-03-07: qty 100

## 2022-03-07 MED ORDER — ROCURONIUM BROMIDE 50 MG/5ML IV SOLN: 100.0000 mg | Freq: Once | INTRAVENOUS | Status: AC

## 2022-03-07 MED ORDER — POTASSIUM CHLORIDE 20 MEQ PO PACK
40.0000 meq | PACK | Freq: Once | ORAL | Status: AC
  Administered 2022-03-07: 40 meq
  Filled 2022-03-07: qty 2

## 2022-03-07 MED ORDER — FENTANYL 2500MCG IN NS 250ML (10MCG/ML) PREMIX INFUSION
50.0000 ug/h | INTRAVENOUS | Status: DC
  Administered 2022-03-07: 50 ug/h via INTRAVENOUS
  Administered 2022-03-08: 175 ug/h via INTRAVENOUS
  Administered 2022-03-09 (×2): 150 ug/h via INTRAVENOUS
  Administered 2022-03-11: 50 ug/h via INTRAVENOUS
  Filled 2022-03-07 (×5): qty 250

## 2022-03-07 MED ORDER — MIDAZOLAM HCL 2 MG/2ML IJ SOLN
INTRAMUSCULAR | Status: AC
  Filled 2022-03-07: qty 2

## 2022-03-07 MED ORDER — DEXMEDETOMIDINE HCL IN NACL 400 MCG/100ML IV SOLN
INTRAVENOUS | Status: AC
  Filled 2022-03-07: qty 100

## 2022-03-07 MED ORDER — SODIUM CHLORIDE 0.9 % IV SOLN
250.0000 mL | INTRAVENOUS | Status: DC
  Administered 2022-03-07: 250 mL via INTRAVENOUS

## 2022-03-07 MED ORDER — FENTANYL BOLUS VIA INFUSION
50.0000 ug | INTRAVENOUS | Status: DC | PRN
  Administered 2022-03-08: 100 ug via INTRAVENOUS
  Administered 2022-03-10: 50 ug via INTRAVENOUS

## 2022-03-07 MED ORDER — ETOMIDATE 2 MG/ML IV SOLN: 20.0000 mg | Freq: Once | INTRAVENOUS | Status: AC

## 2022-03-07 MED ORDER — KETAMINE HCL 50 MG/5ML IJ SOSY
PREFILLED_SYRINGE | INTRAMUSCULAR | Status: AC
  Filled 2022-03-07: qty 5

## 2022-03-07 MED ORDER — FENTANYL CITRATE PF 50 MCG/ML IJ SOSY
50.0000 ug | PREFILLED_SYRINGE | Freq: Once | INTRAMUSCULAR | Status: AC
  Administered 2022-03-09: 100 ug via INTRAVENOUS

## 2022-03-07 MED ORDER — DESMOPRESSIN ACETATE 0.1 MG PO TABS
0.0500 mg | ORAL_TABLET | Freq: Two times a day (BID) | ORAL | Status: DC
  Administered 2022-03-07: 0.05 mg
  Filled 2022-03-07 (×3): qty 1

## 2022-03-07 MED ORDER — SUCCINYLCHOLINE CHLORIDE 200 MG/10ML IV SOSY
PREFILLED_SYRINGE | INTRAVENOUS | Status: AC
  Filled 2022-03-07: qty 10

## 2022-03-07 MED ORDER — ROCURONIUM BROMIDE 10 MG/ML (PF) SYRINGE
PREFILLED_SYRINGE | INTRAVENOUS | Status: AC
  Administered 2022-03-07: 100 mg
  Filled 2022-03-07: qty 10

## 2022-03-07 NOTE — Procedures (Signed)
Intubation Procedure Note  Judit Awad  [id number]  Oct 30, 1965  Date:03/07/22  Time:6:08 PM   Provider Performing:Jin Capote    Procedure: Intubation (31500)  Indication(s) Respiratory Failure  Consent Risks of the procedure as well as the alternatives and risks of each were explained to the patient and/or caregiver.  Consent for the procedure was obtained and is signed in the bedside chart   Anesthesia Etomidate and Rocuronium   Time Out Verified patient identification, verified procedure, site/side was marked, verified correct patient position, special equipment/implants available, medications/allergies/relevant history reviewed, required imaging and test results available.   Sterile Technique Usual hand hygeine, masks, and gloves were used   Procedure Description Patient positioned in bed supine.  Sedation given as noted above.  Patient was intubated with endotracheal tube using Glidescope.  View was Grade 1 full glottis .  Number of attempts was  2 .  Colorimetric CO2 detector was consistent with tracheal placement.   Complications/Tolerance None; patient tolerated the procedure well. Chest X-ray is ordered to verify placement.   EBL Minimal   Specimen(s) None

## 2022-03-07 NOTE — Progress Notes (Signed)
RT NOTE:  ETT pulled back 3cm per MD order. Secured @ 22cm.

## 2022-03-07 NOTE — Progress Notes (Signed)
Physical Therapy Treatment Patient Details Name: Gabrielle Vincent MRN: [id number] DOB: 01/14/1966 Today's Date: 03/07/2022   History of Present Illness 56 y.o. female presents to [facility] on 02/14/2022 with complaints of severe back pain associated with hematuria and dysuria. Pt admitted for management of UTI and AKI. MRI demonstrates small herniated nucleus pulposus at L3-L4 on the left side. 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 Pt intubated 02/16/2022. Cultures positive for meningitis. MRI 02/23/2022 demonstrates diffuse epidural abscess with cord compression and likely signal change. Pt underwent C3-T1 decompressive laminectomy with epidural abscess evaucation along with C3-7 fusion on 5/13. MRI 5/16 demonstrates Extensive thoracic epidural abscess. 5/18- Pt underwent Thoracolumbar laminectomy and decompression of epidural abscess. Extubated 5/19. PMH includes obesity, HLD, diverticulosis.    PT Comments    Pt inconsistently following commands this session and demonstrating no AROM of BLE to this point. Pt appears fatigued and in pain with most mobility, continuing to require totalA. Pt has made limited to no progress since initiation of PT services. Pt likely to need increased time to make meaningful functional recovery based on profound weakness and poor activity tolerance. PT updates recommendations to SNF placement at this time.   Recommendations for follow up therapy are one component of a multi-disciplinary discharge planning process, led by the attending physician.  Recommendations may be updated based on patient status, additional functional criteria and insurance authorization.  Follow Up Recommendations  Skilled nursing-short term rehab (<3 hours/day)     Assistance Recommended at Discharge Frequent or constant Supervision/Assistance  Patient can return home with the following Two people to help with walking and/or transfers;Two people to help with  bathing/dressing/bathroom;Assistance with cooking/housework;Assistance with feeding;Assist for transportation;Help with stairs or ramp for entrance;Direct supervision/assist for medications management;Direct supervision/assist for financial management   Equipment Recommendations  Wheelchair (measurements PT);Wheelchair cushion (measurements PT);Hospital bed;Other (comment) (hoyer lift)    Recommendations for Other Services       Precautions / Restrictions Precautions Precautions: Fall;Back Precaution Booklet Issued: No Precaution Comments: Reviewed precautions Required Braces or Orthoses:  (no brace needed per orders) Restrictions Weight Bearing Restrictions: No     Mobility  Bed Mobility Overal bed mobility: Needs Assistance Bed Mobility: Rolling Rolling: Total assist         General bed mobility comments: pt also attempts to initiate pulling from bed egress position into long sitting without back support, also requires totalA    Transfers                        Ambulation/Gait                   Stairs             Wheelchair Mobility    Modified Rankin (Stroke Patients Only)       Balance Overall balance assessment: Needs assistance Sitting-balance support: Bilateral upper extremity supported, Feet supported Sitting balance-Leahy Scale: Zero Sitting balance - Comments: totalA, posterior lean in long sitting Postural control: Posterior lean                                  Cognition Arousal/Alertness: Awake/alert Behavior During Therapy: Flat affect Overall Cognitive Status: Difficult to assess  General Comments: pt inconsistently follows commands, continues to attempt to mouth words during session which PT cannot discern consistently        Exercises Other Exercises Other Exercises: PROM BLE knee flexion/extension, hip flexion/abduction/adduction for 10 reps Other  Exercises: ankle PF stretch, one minute hold bilaterally Other Exercises: AAROM BUE shoulder flexion, elbow flexion/extension, wrist flexion/extension 10 reps    General Comments General comments (skin integrity, edema, etc.): tachy into 120s, RR near 30 with activity      Pertinent Vitals/Pain Pain Assessment Pain Assessment: Faces Faces Pain Scale: Hurts whole lot Pain Location: back/neck Pain Descriptors / Indicators: Grimacing Pain Intervention(s): Monitored during session    Home Living                          Prior Function            PT Goals (current goals can now be found in the care plan section) Acute Rehab PT Goals Patient Stated Goal: to gain strength in extremities and move better Progress towards PT goals: Not progressing toward goals - comment (poor activity tolerance and participation)    Frequency    Min 3X/week      PT Plan Discharge plan needs to be updated    Co-evaluation              AM-PAC PT "6 Clicks" Mobility   Outcome Measure  Help needed turning from your back to your side while in a flat bed without using bedrails?: Total Help needed moving from lying on your back to sitting on the side of a flat bed without using bedrails?: Total Help needed moving to and from a bed to a chair (including a wheelchair)?: Total Help needed standing up from a chair using your arms (e.g., wheelchair or bedside chair)?: Total Help needed to walk in hospital room?: Total Help needed climbing 3-5 steps with a railing? : Total 6 Click Score: 6    End of Session Equipment Utilized During Treatment: Oxygen Activity Tolerance: Patient limited by pain;Patient limited by fatigue Patient left: in bed;with call bell/phone within reach;with bed alarm set Nurse Communication: Mobility status;Need for lift equipment PT Visit Diagnosis: Other abnormalities of gait and mobility (R26.89);Other symptoms and signs involving the nervous system  (R29.898);Pain     Time: 1135-1202 PT Time Calculation (min) (ACUTE ONLY): 27 min  Charges:  $Therapeutic Exercise: 8-22 mins $Therapeutic Activity: 8-22 mins                     [name], PT, DPT Acute Rehabilitation Pager: [phone] Office [place] 03/07/2022, 12:47 PM

## 2022-03-07 NOTE — Progress Notes (Signed)
[facility] ADULT ICU REPLACEMENT PROTOCOL   The patient does apply for the [facility] Adult ICU Electrolyte Replacment Protocol based on the criteria listed below:   1.Exclusion criteria: TCTS patients, ECMO patients, and Dialysis patients 2. Is GFR >/= 30 ml/min? Yes.    Patient's GFR today is >60 3. Is SCr </= 2? Yes.   Patient's SCr is 0.96 mg/dL 4. Did SCr increase >/= 0.5 in 24 hours? No. 5.Pt's weight >40kg  Yes.   6. Abnormal electrolyte(s):   K 3.6  7. Electrolytes replaced per protocol 8.  Call MD STAT for K+ </= 2.5, Phos </= 1, or Mag </= 1 Physician:  Gwendlyn Deutscher R Milda Lindvall 03/07/2022 5:53 AM

## 2022-03-07 NOTE — Progress Notes (Signed)
An USGPIV (ultrasound guided PIV) has been placed for short-term vasopressor infusion. A correctly placed ivWatch must be used when administering Vasopressors. Should this treatment be needed beyond 72 hours, central line access should be obtained.  It will be the responsibility of the bedside nurse to follow best practice to prevent extravasations.   ?

## 2022-03-07 NOTE — Progress Notes (Signed)
PCCM interval note:  Patient became tachypneic and lethargic, she has very weak cough, high risk of aspiration.  She is septic with multiple sources and probably she is aspirating at this time. Due to weak cough, severe sepsis, tachypnea and increased work of breathing decision was made to proceed with endotracheal intubation  Patient's family was called and informed about interval change in her condition  Patient was intubated using glide scope, please see separate note  Additional critical care time: 20 minutes  Performed by: Jacky Kindle   Critical care time was exclusive of separately billable procedures and treating other patients.   Critical care was necessary to treat or prevent imminent or life-threatening deterioration.   Critical care was time spent personally by me on the following activities: development of treatment plan with patient and/or surrogate as well as nursing, discussions with consultants, evaluation of patient's response to treatment, examination of patient, obtaining history from patient or surrogate, ordering and performing treatments and interventions, ordering and review of laboratory studies, ordering and review of radiographic studies, pulse oximetry and re-evaluation of patient's condition.   Jacky Kindle MD [facility] See Amion for pager If no response to pager, please call [phone] until 7pm After 7pm, Please call E-link [phone]

## 2022-03-07 NOTE — Progress Notes (Signed)
Around 1700 I noticed the patient began breathing around 35-40 times a minute, tachycardic in the 130s and became more somnolent. Patient SpO2 92-94%. Dr Tacy Learn and Kennieth Rad, NP notified. Patient emergently intubated at bedside for airway protection.   Montez Hageman, RN

## 2022-03-07 NOTE — Progress Notes (Signed)
Patient transported off unit by this RN and patient transporter to MRI at approximately 2245 hrs overnight (5/23). Patient remained on local telemetry monitoring within the MRI department while off of central monitoring. HR 91-122, SPO2 91-97%. Patient was febrile upon returning to the unit (see flow sheet for vs).

## 2022-03-07 NOTE — Progress Notes (Signed)
[facility]  Date of Admission:  02/14/2022           Reason for visit: Follow up on E coli epidural abscess   Current antibiotics: Ceftriaxone   ASSESSMENT:    56 y.o. female admitted with:  E coli complicated UTI with extensive epidural abscess extending from lumbar to cervical spine with associated meningitis: Status post cervical decompression and instrumentation C3-7 on 5/13 and thoracolumbar laminectomy and decompression epidural abscess on 5/18.  Repeat MRI shows significant decompression of the epidural space, but with ongoing residual fluid although NSGY not sure that further surgical debridement will improve or resolve fevers.  Of note, MRI also noted enhancing lesions in the cerebellum.  MRI brain is needed for further assessment. Fevers: Continues to be febrile over the last 24 hours.  Repeat blood cx NGTD.  PICC line removed 5/23 and her Foley remains in place.  Repeat MRI 5/23 notable for residual fluid as well as new concern for what may be a cerebral abscess given her clinical picture. Newly diagnosed DM: A1c is 8.9. Right upper lobe 2.2cm spiculating mass: Concerning for malignancy or the possibility of lung abscess has been raised.  CT chest has been ordered.  RECOMMENDATIONS:    Continue ceftriaxone 2 gm BID MRI brain with and without contrast CT chest with contrast Follow blood cx Wound care, lab monitoring, glycemic control Appreciate CCM and NSGY Discussed with CCM Will follow   Principal Problem:   Abscess in epidural space of cervical spine Active Problems:   Sepsis secondary to UTI (HCC)   Hyponatremia   Hypokalemia   AKI (acute kidney injury) ([place])   Abnormal LFTs   Class II obesity   Hepatic steatosis   Cholelithiasis   Esophageal thickening   Acute low back pain   Encephalopathy acute   Acute respiratory failure (HCC)   Radiculopathy of lumbar region   E. coli UTI   Meningitis   Bacterial meningitis    Diabetes insipidus ([place])   H/O excision of lamina of cervical vertebra for decompression of spinal cord   Acute pyelonephritis   E coli bacteremia    MEDICATIONS:    Scheduled Meds:  chlorhexidine gluconate (MEDLINE KIT)  15 mL Mouth Rinse BID   Chlorhexidine Gluconate Cloth  6 each Topical Daily   desmopressin  0.05 mg Per Tube QID   feeding supplement (PROSource TF)  90 mL Per Tube BID   free water  300 mL Per Tube Q2H   heparin injection (subcutaneous)  5,000 Units Subcutaneous Q8H   insulin aspart  0-15 Units Subcutaneous Q4H   insulin aspart  8 Units Subcutaneous Q4H   insulin glargine-yfgn  28 Units Subcutaneous BID   mouth rinse  15 mL Mouth Rinse 10 times per day   midodrine  10 mg Per Tube TID WC   pantoprazole sodium  40 mg Per Tube Q1400   senna  1 tablet Per Tube BID   sodium chloride flush  10-40 mL Intracatheter Q12H   sodium chloride flush  3 mL Intravenous Q12H   zinc oxide   Topical BID   Continuous Infusions:  sodium chloride 250 mL (03/06/22 2126)   cefTRIAXone (ROCEPHIN)  IV 2 g (03/06/22 2128)   feeding supplement (JEVITY 1.5 CAL/FIBER) 1,000 mL (03/07/22 0406)   PRN Meds:.acetaminophen (TYLENOL) oral liquid 160 mg/5 mL, acetaminophen **OR** acetaminophen, bisacodyl, docusate, fentaNYL (SUBLIMAZE) injection, lip balm, menthol-cetylpyridinium **OR** phenol, [DISCONTINUED] ondansetron **OR** ondansetron (ZOFRAN) IV, ondansetron **  OR** [DISCONTINUED] ondansetron (ZOFRAN) IV, oxyCODONE, polyethylene glycol, sodium chloride flush, sodium chloride flush  SUBJECTIVE:   24 hour events:  Fevers persist.  Tmax 102.2 PICC removed Repeat MRI T and L spine done BMP stable   Patient undergoing patient care by the nursing staff.   Review of Systems  All other systems reviewed and are negative.    OBJECTIVE:   Blood pressure (!) 126/59, pulse (!) 112, temperature 100.2 F (37.9 C), temperature source Axillary, resp. rate (!) 26, height 5' 4"  (1.626 m), weight  91.2 kg, SpO2 97 %. Body mass index is 34.51 kg/m.  Physical Exam Constitutional:      General: She is not in acute distress. HENT:     Head: Normocephalic and atraumatic.  Eyes:     Extraocular Movements: Extraocular movements intact.     Conjunctiva/sclera: Conjunctivae normal.  Abdominal:     General: There is no distension.     Palpations: Abdomen is soft.     Tenderness: There is no abdominal tenderness.  Genitourinary:    Comments: Foley in place.  Skin:    General: Skin is warm and dry.     Findings: No rash.  Neurological:     General: No focal deficit present.     Mental Status: She is alert and oriented to person, place, and time.     Motor: Weakness present.  Psychiatric:        Mood and Affect: Mood normal.        Behavior: Behavior normal.     Lab Results: Lab Results  Component Value Date   WBC 11.5 (H) 03/06/2022   HGB 8.7 (L) 03/06/2022   HCT 28.5 (L) 03/06/2022   MCV 96.9 03/06/2022   PLT 251 03/06/2022    Lab Results  Component Value Date   NA 147 (H) 03/07/2022   K 3.6 03/07/2022   CO2 27 03/07/2022   GLUCOSE 133 (H) 03/07/2022   BUN 25 (H) 03/07/2022   CREATININE 0.96 03/07/2022   CALCIUM 8.4 (L) 03/07/2022   GFRNONAA >60 03/07/2022    Lab Results  Component Value Date   ALT 15 03/03/2022   AST 18 03/03/2022   ALKPHOS 90 03/03/2022   BILITOT 0.7 03/03/2022    No results found for: CRP  No results found for: ESRSEDRATE   I have reviewed the micro and lab results in Epic.  Imaging: MR CERVICAL SPINE W WO CONTRAST  Result Date: 03/07/2022 CLINICAL DATA:  Epidural abscess EXAM: MRI CERVICAL AND THORACIC SPINE WITHOUT AND WITH CONTRAST TECHNIQUE: Multiplanar and multiecho pulse sequences of the cervical spine, to include the craniocervical junction and cervicothoracic junction, and the thoracic spine, were obtained without and with intravenous contrast. CONTRAST:  65m GADAVIST GADOBUTROL 1 MMOL/ML IV SOLN COMPARISON:  02/23/2022  FINDINGS: Evaluation is somewhat limited by motion artifact. MRI CERVICAL SPINE FINDINGS Alignment: Straightening of the normal cervical lordosis. No significant listhesis. Vertebrae: Status post interval posterior decompression and fixation C3-C7. Susceptibility artifact from the hardware limits evaluation at these levels. Increased T2 signal and enhancement about the C4-C5 and C5-C6 discs and endplates, without definite loss of cortical signal. Cord: Evaluation is limited by susceptibility. The imaged portions of the cord are normal in signal and morphology. Previously noted epidural abscess is no longer seen. Posterior Fossa, vertebral arteries, paraspinal tissues: Extensive edema in the soft tissues posterior to the cervical spine with a small fluid collection in the subcutaneous fat (series 9, image 23). Additional small fluid collection about the  spinous process of T1 (series 9, image 34). These are not unexpected postoperatively. No evidence of a deeper fluid collection near the thecal sac. Enhancing lesions are seen in the cerebellum (series 23, image 7), which were not on the prior exam, incompletely evaluated. Decreased leptomeningeal enhancement about the base of the brain and posterior fossa. Disc levels: C2-C3: No significant disc bulge. No spinal canal stenosis or neuroforaminal narrowing. C3-C4: Status post decompression. No spinal canal stenosis or neural foraminal narrowing. C4-C5: Disc height loss with disc osteophyte complex. Status post decompression. No spinal canal stenosis or neural foraminal narrowing. C5-C6: Disc height loss with disc osteophyte complex. Status post decompression. No spinal canal stenosis or definite neural foraminal narrowing. C6-C7: Disc height loss with disc osteophyte complex. Status post decompression. No spinal canal stenosis. Moderate left neural foraminal narrowing. C7-T1: Small right paracentral disc protrusion. No spinal canal stenosis or neural foraminal  narrowing. MRI THORACIC SPINE FINDINGS Alignment:  Physiologic. Vertebrae: Diffusely decreased marrow signal which is nonspecific but can be seen with anemia, smoking, and obesity. No acute fracture or suspicious osseous lesion. Cord: Previously noted epidural collection is now not definitively seen until the level of T11, where it is significantly decreased in size, status post interval left hemilaminectomy and drainage at T11-T12. The collection measures up to 5 mm at the level of T12, previously 9 mm when remeasured similarly. The collection does appear to extend off the inferior field of view into the lumbar epidural space. Paraspinal and other soft tissues: Postsurgical changes posterior to T11 and T12. No evidence of significant fluid collection in the soft tissues. Disc levels: No significant residual spinal canal stenosis in the thoracic spine. IMPRESSION: 1. Evaluation is somewhat limited by motion artifact. Within this limitation, there has been interval posterior decompression and fixation at C3-C7 and left hemilaminectomy at T11-T12, with drainage of the majority of the previously noted epidural abscess. Epidural collection is now seen from the level of T11, extending off the inferior field of view in the lumbar spine, decreased in size from the prior exam, measuring up to 5 mm at the level of T12, previously 9 mm when remeasured similarly. 2. Enhancing lesions are noted in the imaged portion of the cerebellum, which is incompletely evaluated. An MRI with and without contrast is recommended for further evaluation. 3. Edema in the soft tissues posterior to the cervical spine with a small fluid collection in the subcutaneous fat and about the spinous process of T1, which are not unexpected postoperatively. No evidence of a deeper fluid collection near the thecal sac or abscess. 4. Evaluation of the cervical spine is limited by susceptibility, however there appears to be increased T2 signal and enhancement  about the C4-C5 and C5-C6 discs and endplates, without definite loss cortical signal. This may be inflammatory or degenerative but could also be seen with early discitis osteomyelitis. Attention on follow-up. 5. No residual spinal canal stenosis in the cervical or thoracic spine. These results will be called to the ordering clinician or representative by the Radiologist Assistant, and communication documented in the PACS or [employer]. Electronically Signed   By: Merilyn Baba M.D.   On: 03/07/2022 01:44   MR THORACIC SPINE W WO CONTRAST  Result Date: 03/07/2022 CLINICAL DATA:  Epidural abscess EXAM: MRI CERVICAL AND THORACIC SPINE WITHOUT AND WITH CONTRAST TECHNIQUE: Multiplanar and multiecho pulse sequences of the cervical spine, to include the craniocervical junction and cervicothoracic junction, and the thoracic spine, were obtained without and with intravenous contrast. CONTRAST:  37m GADAVIST GADOBUTROL 1 MMOL/ML IV SOLN COMPARISON:  02/23/2022 FINDINGS: Evaluation is somewhat limited by motion artifact. MRI CERVICAL SPINE FINDINGS Alignment: Straightening of the normal cervical lordosis. No significant listhesis. Vertebrae: Status post interval posterior decompression and fixation C3-C7. Susceptibility artifact from the hardware limits evaluation at these levels. Increased T2 signal and enhancement about the C4-C5 and C5-C6 discs and endplates, without definite loss of cortical signal. Cord: Evaluation is limited by susceptibility. The imaged portions of the cord are normal in signal and morphology. Previously noted epidural abscess is no longer seen. Posterior Fossa, vertebral arteries, paraspinal tissues: Extensive edema in the soft tissues posterior to the cervical spine with a small fluid collection in the subcutaneous fat (series 9, image 23). Additional small fluid collection about the spinous process of T1 (series 9, image 34). These are not unexpected postoperatively. No evidence of a deeper  fluid collection near the thecal sac. Enhancing lesions are seen in the cerebellum (series 23, image 7), which were not on the prior exam, incompletely evaluated. Decreased leptomeningeal enhancement about the base of the brain and posterior fossa. Disc levels: C2-C3: No significant disc bulge. No spinal canal stenosis or neuroforaminal narrowing. C3-C4: Status post decompression. No spinal canal stenosis or neural foraminal narrowing. C4-C5: Disc height loss with disc osteophyte complex. Status post decompression. No spinal canal stenosis or neural foraminal narrowing. C5-C6: Disc height loss with disc osteophyte complex. Status post decompression. No spinal canal stenosis or definite neural foraminal narrowing. C6-C7: Disc height loss with disc osteophyte complex. Status post decompression. No spinal canal stenosis. Moderate left neural foraminal narrowing. C7-T1: Small right paracentral disc protrusion. No spinal canal stenosis or neural foraminal narrowing. MRI THORACIC SPINE FINDINGS Alignment:  Physiologic. Vertebrae: Diffusely decreased marrow signal which is nonspecific but can be seen with anemia, smoking, and obesity. No acute fracture or suspicious osseous lesion. Cord: Previously noted epidural collection is now not definitively seen until the level of T11, where it is significantly decreased in size, status post interval left hemilaminectomy and drainage at T11-T12. The collection measures up to 5 mm at the level of T12, previously 9 mm when remeasured similarly. The collection does appear to extend off the inferior field of view into the lumbar epidural space. Paraspinal and other soft tissues: Postsurgical changes posterior to T11 and T12. No evidence of significant fluid collection in the soft tissues. Disc levels: No significant residual spinal canal stenosis in the thoracic spine. IMPRESSION: 1. Evaluation is somewhat limited by motion artifact. Within this limitation, there has been interval  posterior decompression and fixation at C3-C7 and left hemilaminectomy at T11-T12, with drainage of the majority of the previously noted epidural abscess. Epidural collection is now seen from the level of T11, extending off the inferior field of view in the lumbar spine, decreased in size from the prior exam, measuring up to 5 mm at the level of T12, previously 9 mm when remeasured similarly. 2. Enhancing lesions are noted in the imaged portion of the cerebellum, which is incompletely evaluated. An MRI with and without contrast is recommended for further evaluation. 3. Edema in the soft tissues posterior to the cervical spine with a small fluid collection in the subcutaneous fat and about the spinous process of T1, which are not unexpected postoperatively. No evidence of a deeper fluid collection near the thecal sac or abscess. 4. Evaluation of the cervical spine is limited by susceptibility, however there appears to be increased T2 signal and enhancement about the C4-C5 and C5-C6 discs  and endplates, without definite loss cortical signal. This may be inflammatory or degenerative but could also be seen with early discitis osteomyelitis. Attention on follow-up. 5. No residual spinal canal stenosis in the cervical or thoracic spine. These results will be called to the ordering clinician or representative by the Radiologist Assistant, and communication documented in the PACS or [employer]. Electronically Signed   By: Merilyn Baba M.D.   On: 03/07/2022 01:44   DG Abd Portable 1V  Result Date: 03/05/2022 CLINICAL DATA:  Feeding tube placement EXAM: PORTABLE ABDOMEN - 1 VIEW COMPARISON:  None Available. FINDINGS: Feeding tube with tip in the distal stomach at the level of the pylorus. IMPRESSION: Feeding tube in distal stomach.  Stylet removed. Electronically Signed   By: Suzy Bouchard M.D.   On: 03/05/2022 10:36     Imaging independently reviewed in Epic.    [facility] [phone] pager 03/07/2022, 8:26 AM

## 2022-03-07 NOTE — Progress Notes (Signed)
NAME:  Hephzibah Strehle, MRN:  [id number], DOB:  26-Jan-1966, LOS: 21 ADMISSION DATE:  02/14/2022, CONSULTATION DATE:  5/5 REFERRING MD:  Rai, CHIEF COMPLAINT:  acute encephalopathy and sepsis    History of Present Illness:  56 year old female who was admitted on 5/3 with chief complaint of severe low back pain radiating down her left leg.  Also had associated hematuria and dysuria.  She had no weakness although low did say pain impacted her ability to walk.  Also noted poor p.o. intake, decreased urination as well as urinary retention, hematuria, dysuria, and some nausea.   CT scan was negative for obstructive uropathy did show fatty liver infiltrates, there is cholelithiasis but no acute cholecystitis there is mild circumferential thickening of the distal esophagus global enlargement of the uterus with thickened appearance of the endometrial stripe with radiology recommending nonemergent pelvic ultrasound.  Diagnostic evaluation notable for new AKI with serum creatinine 3.64, acute transaminitis leukocytosis, and hyponatremia of 127 she was admitted for further evaluation, cultures were sent, and she was started on IV ceftriaxone.  On 5/3 patient was noted to be more confused, speech was slurred , Working diagnosis was #1 urinary tract infection with resultant sepsis and also low back pain for which neurosurgery was consulted as MRI finding showed acute herniated L3-L4 disc with left lumbar radiculopathy.  It was felt that pain control via lumbar injection may help in that this could be treated conservatively.  On 5/4 patient becoming intermittently agitated then lethargic, because of this a CT of head was obtained this was severely limited due to motion degraded meant but there was concern about hypodense area in the right basal ganglia section raising concern for acute infarct.  On 5/5 a rapid response was called the patient was severely agitated, pulled out IVs, pull out Foley catheter  Later that  afternoon mental status continued to worsen.  Neurology consult was obtained in addition to this infectious disease was consulted with urine growing E. coli and narrowed antibiotics to cefazolin stopped vancomycin and recommended supportive care because her mental status continued to decline, she had severe metabolic derangements, and we were unable to provide medical care on the [facility] she was transferred to the ICU for higher level of care.  On initial arrival she was agitated requiring several nurses to hold her down, tachypneic tachycardic would not follow commands had marked increased work of breathing.  An intraosseous line was placed in the right lower extremity, she was intubated for airway protection to facilitate further MRI imaging, and a right IJ triple-lumen catheter was placed  Pertinent  Medical History  Class II obesity, hepatic steatosis, seasonal allergies, varicose veins diverticulosis, HL   Significant Hospital Events: Including procedures, antibiotic start and stop dates in addition to other pertinent events   5/3 admitted with back pain and urinary tract infection , Further complicated by AKI hyponatremia and multiple metabolic derangements.   CT imaging for renal stones showed no acute abdominal pelvic findings there was no obstructive uropathy there was severe fatty liver disease there was cholelithiasis but no cholecystitis there is colonic diverticulosis but no diverticulitis, mild circumferential thickening of the esophagus globular enlargement of the uterus with radiology recommending nonemergent pelvic ultrasound found to have uterine fibroid abdominal Ultrasound showed fatty liver but was epididymides negative.   MRI of the lumbar spine showed small left subarticular disc extrusion with inferior migration at L3-L4 correlating with radiculopathy.  She was started on ceftriaxone cultures were sent 5/4 increased confusion CT  brain obtained raising concern for possible acute  infarct in the right basal ganglia 5/5 progressive encephalopathy , Worsening leukocytosis, hypernatremia, hyperchloremia, slowly improving renal function, slowly improving procalcitonin, moved to ICU due to delirium, intubated for airway protection and to facilitate MRI imaging right IJ central line placed due to limited IV access.  Seen by neurology, also seen by infectious disease with ceftriaxone changed to cefazolin 5/6 antibiotics changed to meningitis coverage given MRI findings of fluid in the ventricles.  Image guided LP ordered 5/8 LP done with purulent CSF, studies consistent with bacterial meningitis, culture growing gm + cocci 5/10 significant rise in sodium with massive urine output of greater than 7 L leading to concern for development of central DI.  DDAVP and volume resuscitation started 5/11 hypernatremia slowly downtrending, continue DDAVP and IV resuscitation per nephrology 5/12 Sodium downtrended to 147 this am 5/13 to OR for posterior cervical decompressive laminectomy and medial facetectomy C3-T1 for evacuation of epirudal abscess, posterior cervical arthrodesis C3-C7, segmental fixation C3-7 5/18 OR for thoacolumbar laminectomy and decompression of epidural abscess from T-L spine 5/19 extubated to [place]  Interim History / Subjective:  Patient continued to spike fever with Tmax 102 Remain off vasopressors  Objective   Blood pressure (!) 126/59, pulse (!) 112, temperature 100.2 F (37.9 C), temperature source Axillary, resp. rate (!) 26, height '5\' 4"'$  (1.626 m), weight 91.2 kg, SpO2 97 %.        Intake/Output Summary (Last 24 hours) at 03/07/2022 1013 Last data filed at 03/07/2022 4650 Gross per 24 hour  Intake 4298.65 ml  Output 4900 ml  Net -601.35 ml   Filed Weights   03/02/22 0500 03/03/22 0424 03/07/22 0413  Weight: 94.5 kg 94.5 kg 91.2 kg   Physical exam: General: Crtitically ill-appearing female, lying on the bed HEENT: [place]/AT, eyes anicteric.  Cortrak in  place Neuro: Awake, following commands, antigravity in bilateral upper extremities at the elbow joint, no movement to painful stimuli in lower extremities Chest: Coarse breath sounds, no wheezes or rhonchi Heart: Regular rate and rhythm, no murmurs or gallops Abdomen: Soft, nontender, nondistended, bowel sounds present Skin: No rash  Assessment & Plan:  Principal Problem:   Abscess in epidural space of cervical spine Active Problems:   Sepsis secondary to UTI (HCC)   Hyponatremia   Hypokalemia   AKI (acute kidney injury) ([place])   Abnormal LFTs   Class II obesity   Hepatic steatosis   Cholelithiasis   Esophageal thickening   Acute low back pain   Encephalopathy acute   Acute respiratory failure (HCC)   Radiculopathy of lumbar region   E. coli UTI   Meningitis   Bacterial meningitis   Diabetes insipidus ([place])   H/O excision of lamina of cervical vertebra for decompression of spinal cord   Acute pyelonephritis   E coli bacteremia   Severe sepsis secondary to E. Coli- UTI , spinal abscess, and meningitis Septic shock has resolved Acute bacterial meningitis with E. Coli due to direct extension from epidural abscess Extensive epidural abscess extending from foramen magnum to the sacral spine s/p C3-T1 laminectomy and evacuation of abscess, s/p thoracolumbar laminectomy and decompression of epidural abcess from thoracolumbar spine on 5/18. 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 Remains high risk for reintubation Remains on room air  NTS PRN CPT  Shock liver superimposed on known fatty liver disease - improved  Anemia of critical illness Transfuse for Hb<7 or hemodynamically significant bleeding -monitor  Poorly controlled diabetes type 2 HbA1c 8.9. Blood sugars are better controlled now Continue Lantus to 28 units twice daily Continue sliding scale with CBG goal 140-180  RUL nodule, concerning for primary lung cancer As she continued to spike fever and there is a lung nodule We will obtain CT chest with contrast to further evaluate this lung nodule   Best Practice (right click and "Reselect all SmartList Selections" daily)   Diet/type: TF DVT prophylaxis: prophylactic heparin  GI prophylaxis: PPI Lines: PICC placed 5/15 Foley:  Yes, and it is still needed- DI Code Status:  full code Last date of multidisciplinary goals of care discussion: 5/19, IPAL note dictated 5/10  Critical care time:    Total critical care time: 35 minutes  Performed by: Jacky Kindle   Critical care time was exclusive of separately billable procedures and treating other patients.   Critical care was necessary to treat or prevent imminent or life-threatening deterioration.   Critical care was time spent personally by me on the following activities:  development of treatment plan with patient and/or surrogate as well as nursing, discussions with consultants, evaluation of patient's response to treatment, examination of patient, obtaining history from patient or surrogate, ordering and performing treatments and interventions, ordering and review of laboratory studies, ordering and review of radiographic studies, pulse oximetry and re-evaluation of patient's condition.   Jacky Kindle MD [facility] See Amion for pager If no response to pager, please call [phone] until 7pm After 7pm, Please call E-link [phone]

## 2022-03-07 NOTE — Progress Notes (Signed)
Speech Language Pathology Treatment: Dysphagia  Patient Details Name: Gabrielle Vincent MRN: [id number] DOB: Jun 05, 1966 Today's Date: 03/07/2022 Time: 1000-1030 SLP Time Calculation (min) (ACUTE ONLY): 30 min  Assessment / Plan / Recommendation Clinical Impression  Majority of session focused on oral care. Pt with coated lingual surface, dried secretions of palate and blocking oropharynx. SLP brushed pts teeth, swabbed mucosa and suctioned repeatedly, removing secretions, moistening lingual surface, stimulating salivary production and increasing pts sensory feedback. Pt eventually able to masticate and swallow a few pieces of ice eliciting throat clearing and expectoration of likely standing thick pharyngeal secretions that also need to loosen up. Pt very tired after all of this and unable to progress. Recommend RN given occasional ice chips to help clear and mantain oral cavity. Otherwise pt still to remain NPO. Will continue to follow closely.   HPI HPI: 56 y.o. female presents 02/14/2022 with severe back pain associated with hematuria and dysuria. Found to have UTI and  AKI. MRI demonstrated small herniated nucleus pulposus at L3-L4. 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 Cultures positive for meningitis. MRI spine demonstrates diffuse epidural abscess with cord compression and likely signal change. Pt underwent C3-T1 decompressive laminectomy with epidural abscess evaucation along with C3-7 fusion on 5/13. MRI 5/16 demonstrates extensive thoracic epidural abscess. 5/18- Pt underwent thoracolumbar laminectomy and decompression of epidural abscess. Extubated 5/19. PMH includes obesity, HLD, diverticulosis.      SLP Plan  Continue with current plan of care      Recommendations for follow up therapy are one component of a multi-disciplinary discharge planning process, led by the attending physician.   Recommendations may be updated based on patient status, additional functional criteria and insurance authorization.    Recommendations  Diet recommendations: Other(comment) (ice chips) Medication Administration: Via alternative means                Oral Care Recommendations: Oral care QID Follow Up Recommendations: Acute inpatient rehab (3hours/day) SLP Visit Diagnosis: Dysphagia, unspecified (R13.10) Plan: Continue with current plan of care           Gabrielle Vincent, Gabrielle Vincent  03/07/2022, 11:37 AM

## 2022-03-07 NOTE — Progress Notes (Signed)
Patient ID: Gabrielle Vincent, female   DOB: [birth year], 56 y.o.   MRN: [id number] Patient continues to have intermittent temperatures though this morning she is 98.  I reviewed the MRI performed yesterday evening and I believe that it shows that there is been significant decompression of the epidural space though there is still some residual fluid there.  Clinically her neurologic exam remains good pain has decreased substantially and her level of alertness is very good.  Persistent fevers are bothersome.  I will continue to follow along clinically [place] put to suggest that further surgical debridement is going to improve or resolve the fevers.

## 2022-03-08 ENCOUNTER — Inpatient Hospital Stay (HOSPITAL_COMMUNITY): Payer: BC Managed Care – PPO

## 2022-03-08 DIAGNOSIS — J9311 Primary spontaneous pneumothorax: Secondary | ICD-10-CM | POA: Diagnosis not present

## 2022-03-08 DIAGNOSIS — J9601 Acute respiratory failure with hypoxia: Secondary | ICD-10-CM | POA: Diagnosis not present

## 2022-03-08 DIAGNOSIS — G061 Intraspinal abscess and granuloma: Secondary | ICD-10-CM | POA: Diagnosis not present

## 2022-03-08 DIAGNOSIS — N179 Acute kidney failure, unspecified: Secondary | ICD-10-CM | POA: Diagnosis not present

## 2022-03-08 DIAGNOSIS — A419 Sepsis, unspecified organism: Secondary | ICD-10-CM | POA: Diagnosis not present

## 2022-03-08 LAB — BASIC METABOLIC PANEL
Anion gap: 10 (ref 5–15)
BUN: 30 mg/dL — ABNORMAL HIGH (ref 6–20)
CO2: 26 mmol/L (ref 22–32)
Calcium: 8.8 mg/dL — ABNORMAL LOW (ref 8.9–10.3)
Chloride: 123 mmol/L — ABNORMAL HIGH (ref 98–111)
Creatinine, Ser: 1.29 mg/dL — ABNORMAL HIGH (ref 0.44–1.00)
GFR, Estimated: 49 mL/min — ABNORMAL LOW (ref >60)
Glucose, Bld: 143 mg/dL — ABNORMAL HIGH (ref 70–99)
Potassium: 4.5 mmol/L (ref 3.5–5.1)
Sodium: 159 mmol/L — ABNORMAL HIGH (ref 135–145)

## 2022-03-08 LAB — CBC WITH DIFFERENTIAL/PLATELET
Abs Immature Granulocytes: 0.47 10*3/uL — ABNORMAL HIGH (ref 0.00–0.07)
Basophils Absolute: 0.2 10*3/uL — ABNORMAL HIGH (ref 0.0–0.1)
Basophils Relative: 1 %
Eosinophils Absolute: 0.2 10*3/uL (ref 0.0–0.5)
Eosinophils Relative: 2 %
HCT: 35.6 % — ABNORMAL LOW (ref 36.0–46.0)
Hemoglobin: 10.8 g/dL — ABNORMAL LOW (ref 12.0–15.0)
Immature Granulocytes: 3 %
Lymphocytes Relative: 20 %
Lymphs Abs: 2.8 10*3/uL (ref 0.7–4.0)
MCH: 29.3 pg (ref 26.0–34.0)
MCHC: 30.3 g/dL (ref 30.0–36.0)
MCV: 96.7 fL (ref 80.0–100.0)
Monocytes Absolute: 1.2 10*3/uL — ABNORMAL HIGH (ref 0.1–1.0)
Monocytes Relative: 8 %
Neutro Abs: 9.1 10*3/uL — ABNORMAL HIGH (ref 1.7–7.7)
Neutrophils Relative %: 66 %
Platelets: 297 10*3/uL (ref 150–400)
RBC: 3.68 MIL/uL — ABNORMAL LOW (ref 3.87–5.11)
RDW: 17.5 % — ABNORMAL HIGH (ref 11.5–15.5)
WBC: 14 10*3/uL — ABNORMAL HIGH (ref 4.0–10.5)
nRBC: 0.6 % — ABNORMAL HIGH (ref 0.0–0.2)

## 2022-03-08 LAB — GLUCOSE, CAPILLARY
Glucose-Capillary: 144 mg/dL — ABNORMAL HIGH (ref 70–99)
Glucose-Capillary: 135 mg/dL — ABNORMAL HIGH (ref 70–99)
Glucose-Capillary: 152 mg/dL — ABNORMAL HIGH (ref 70–99)
Glucose-Capillary: 153 mg/dL — ABNORMAL HIGH (ref 70–99)
Glucose-Capillary: 163 mg/dL — ABNORMAL HIGH (ref 70–99)

## 2022-03-08 IMAGING — MR MR HEAD WO/W CM
8 of 12 series · 29 of 48 positions shown · IV contrast (gadavist)
Comparison: [DATE] MRI brain

CLINICAL DATA: Spinal meningitis and epidural abscess with abnormal
intracranial findings on cervical spine MRI

EXAM:
MRI HEAD WITHOUT AND WITH CONTRAST
TECHNIQUE: Multiplanar, multiecho pulse sequences of the brain and surrounding
structures were obtained without and with intravenous contrast.
CONTRAST:  9mL GADAVIST GADOBUTROL 1 MMOL/ML IV SOLN

[Series 2: FLAIR · sagittal · 5.0mm · 0.47mm/px · 2 of 24 slices shown (1 of 2)]
[im 1/24]
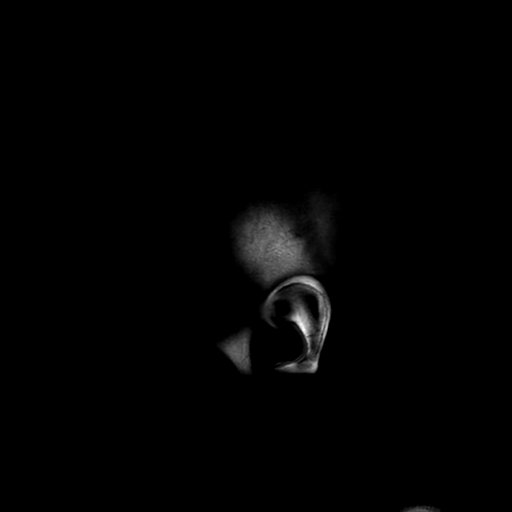
[im 24/24]
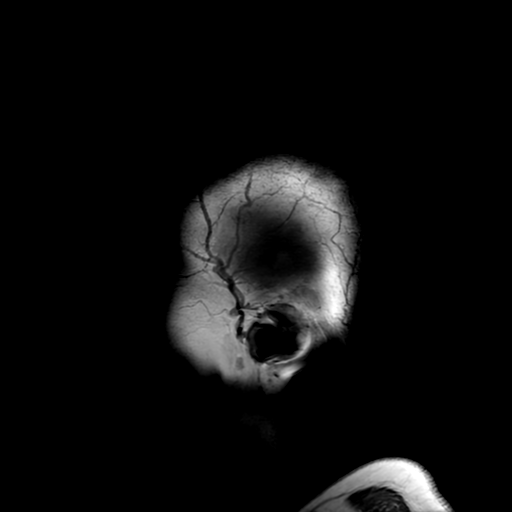

[Series 3: DWI · axial · 3.0mm · 0.94mm/px · z∈[-69,+71]mm · 8 of 96 slices shown (1 of 2)]
[im 1/96]
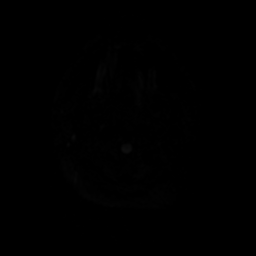
[im 14/96]
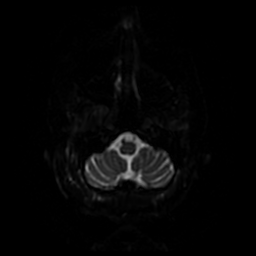
[im 28/96]
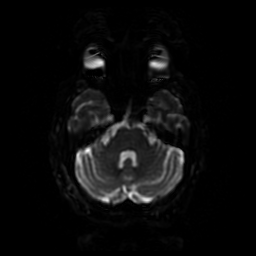
[im 41/96]
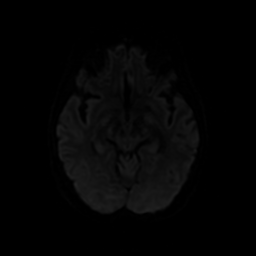
[im 55/96]
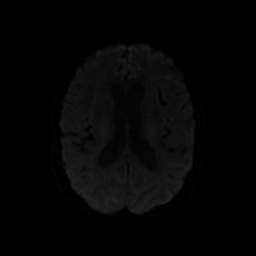
[im 68/96]
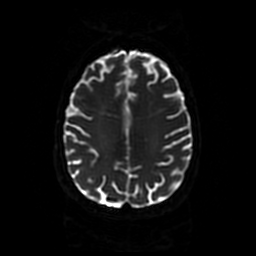
[im 82/96]
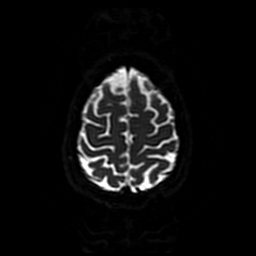
[im 96/96]
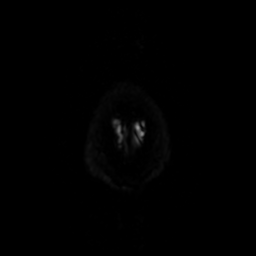

[Series 4: T2 · axial · 5.0mm · 0.47mm/px · z∈[-71,+72]mm · 2 of 25 slices shown]
[im 1/25]
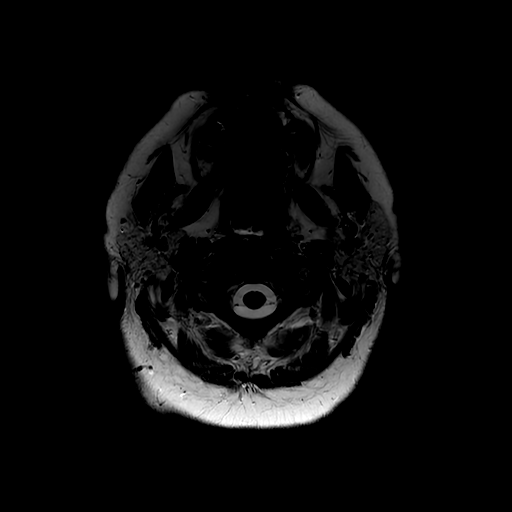
[im 25/25]
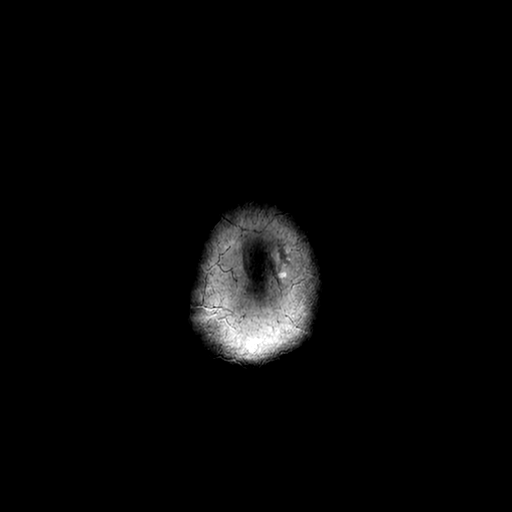

[Series 5: FLAIR · axial · 4.0mm · 0.45mm/px · z∈[-48,+84]mm · 3 of 31 slices shown (2 of 2)]
[im 1/31]
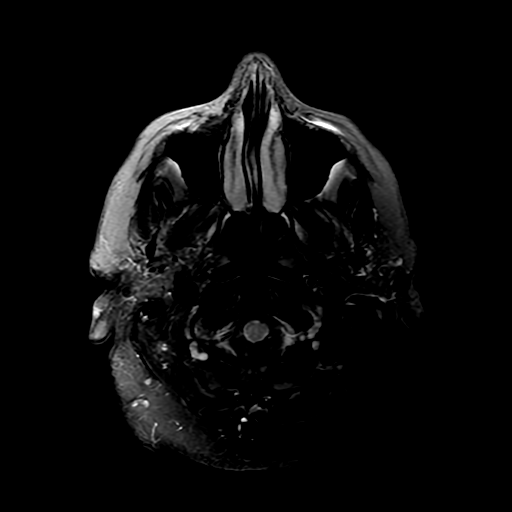
[im 16/31]
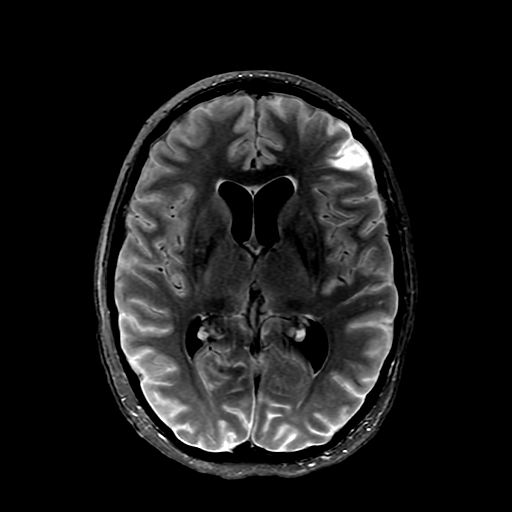
[im 31/31]
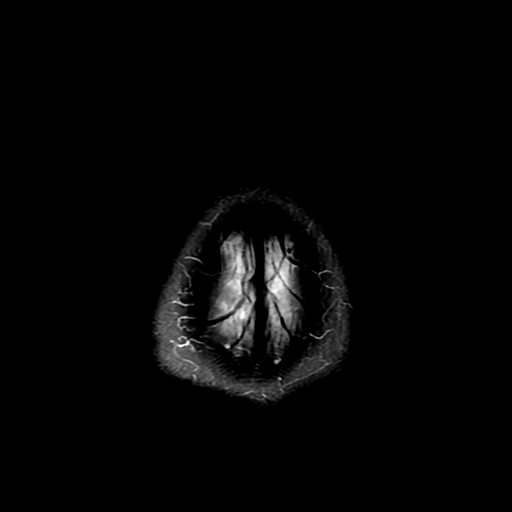

[Series 8: T2 post-contrast · coronal · 5.0mm · 0.20mm/px · 1 of 28 slices shown]
[im 1/28]
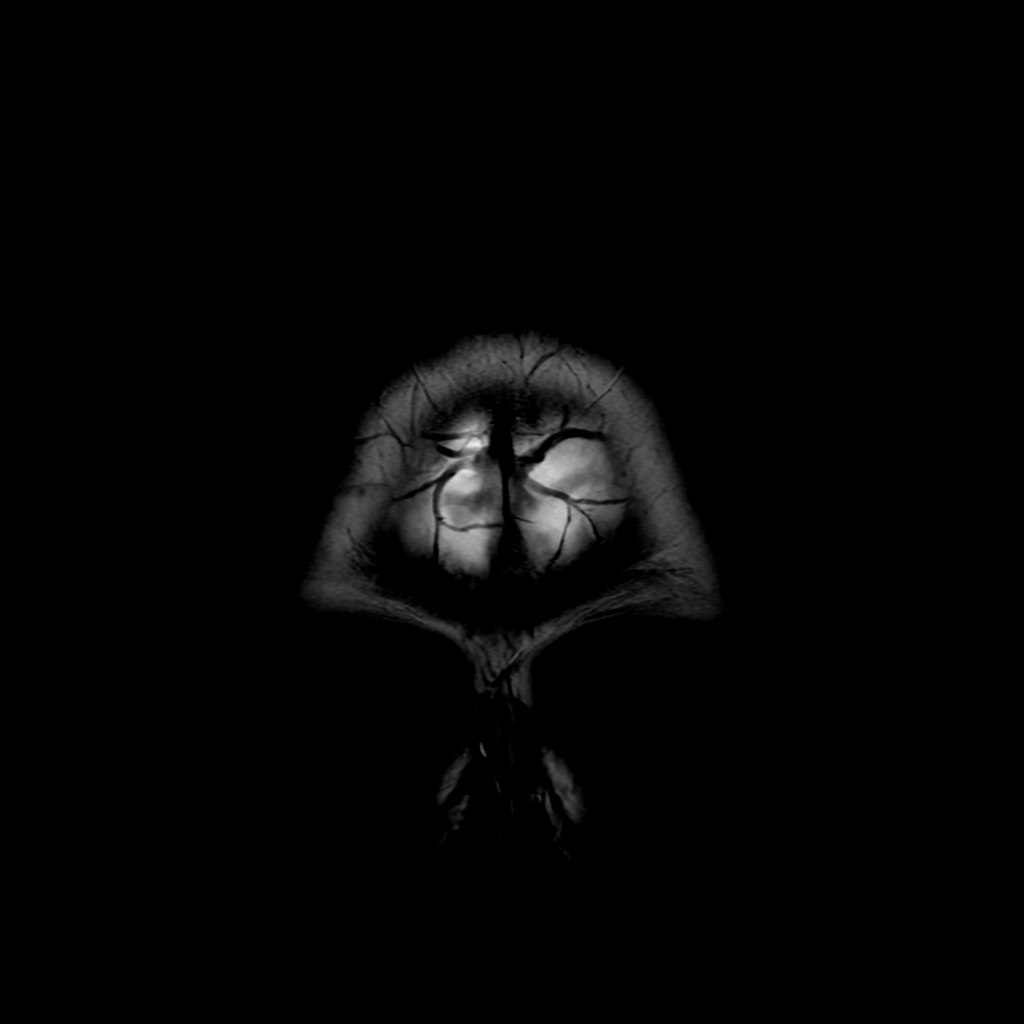

[Series 12: DWI · coronal · 4.0mm · 0.94mm/px · 6 of 68 slices shown (2 of 2)]
[im 1/68]
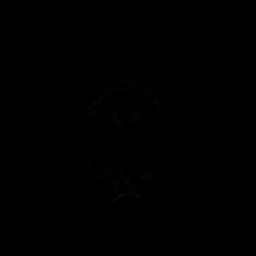
[im 14/68]
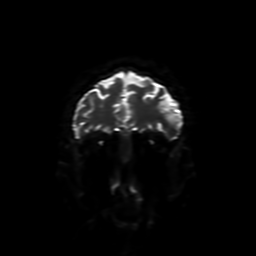
[im 27/68]
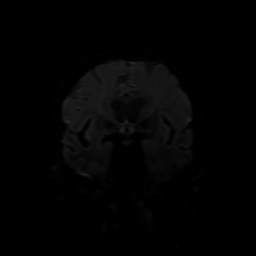
[im 41/68]
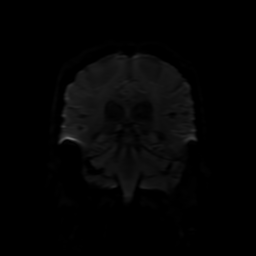
[im 54/68]
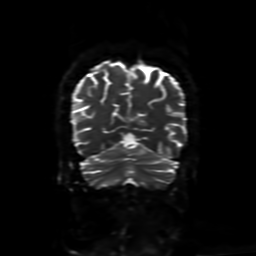
[im 68/68]
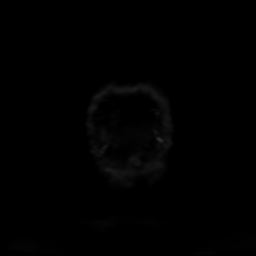

[Series 350: ADC · axial · 3.0mm · 0.94mm/px · z∈[-69,+71]mm · 4 of 48 slices shown (1 of 2)]
[im 1/48]
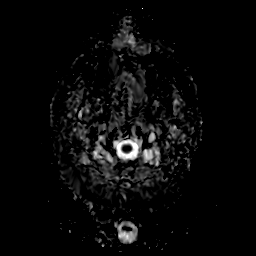
[im 16/48]
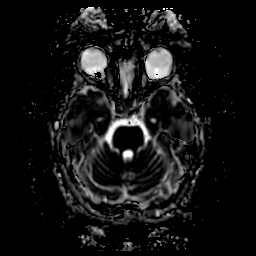
[im 32/48]
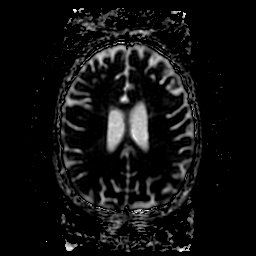
[im 48/48]
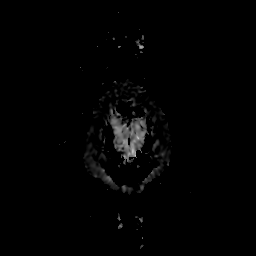

[Series 1250: ADC · coronal · 4.0mm · 0.94mm/px · 3 of 34 slices shown (2 of 2)]
[im 1/34]
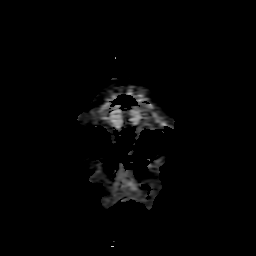
[im 17/34]
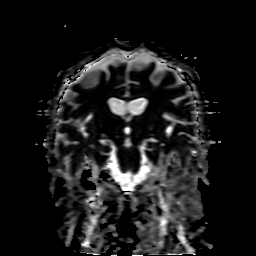
[im 34/34]
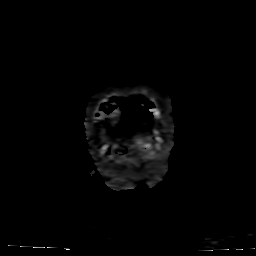

[29 of 48 positions shown; findings below may reference images not displayed]

FINDINGS: Brain: There is diffusely abnormal sulcal signal on T2 FLAIR. There
is increased underlying cortical/subcortical edema in the left
frontal lobe involving middle and inferior gyri. There is some
ill-defined enhancement in this region. Few small foci of
enhancement identified in the cerebellum and were more evident on
the cervical spine MRI. There is persistent small volume layering
debris in the occipital horns with reduced diffusion. Ventricle
caliber is slightly increased.

New patchy diffusion hyperintensity in the central pons with
corresponding T2 hyperintensity. No associated enhancement.

Vascular: Major vessel flow voids at the skull base are preserved.

Skull and upper cervical spine: Normal marrow signal is preserved.

Sinuses/Orbits: Minor mucosal thickening.  Orbits are unremarkable.

Other: Sella is unremarkable.  Patchy mastoid fluid opacification.
IMPRESSION: Meningitis with mildly progressive abnormal signal in the peripheral
left frontal lobe likely reflecting adjacent encephalitis.

Few small foci of cerebellar enhancement are probably leptomeningeal
and reflect meningitis.

Persistent small volume layering debris in the lateral ventricles
again reflecting meningitis. Ventricle caliber is slightly increased
and there may be early hydrocephalus.

New abnormal signal in the central pons reflecting late acute to
subacute ischemia.

## 2022-03-08 IMAGING — DX DG CHEST 1V PORT
1 series · 2 of 2 positions shown · non-contrast
Comparison: [DATE]

CLINICAL DATA: Status post chest tube placement.

EXAM:
PORTABLE CHEST 1 VIEW

[Series 1: chest ap · 0.14mm/px · 2 of 2 slices shown]
[im 1/2]
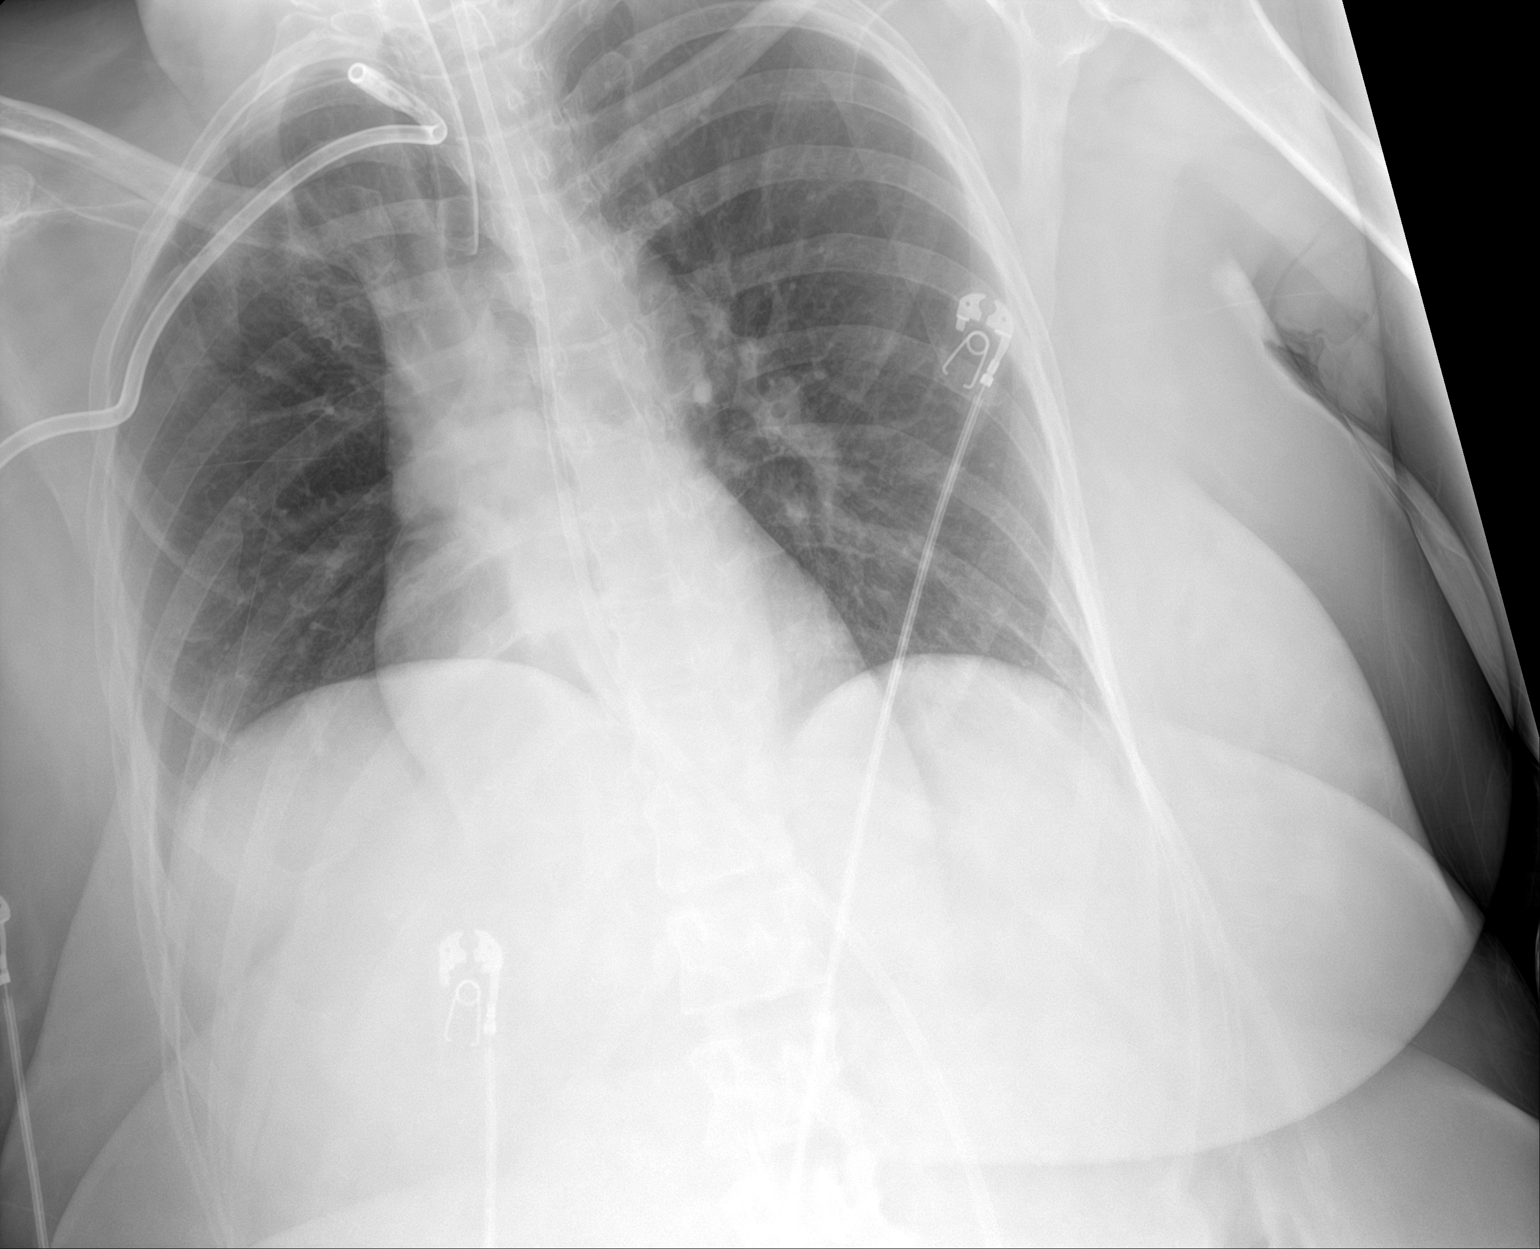
[im 2/2]
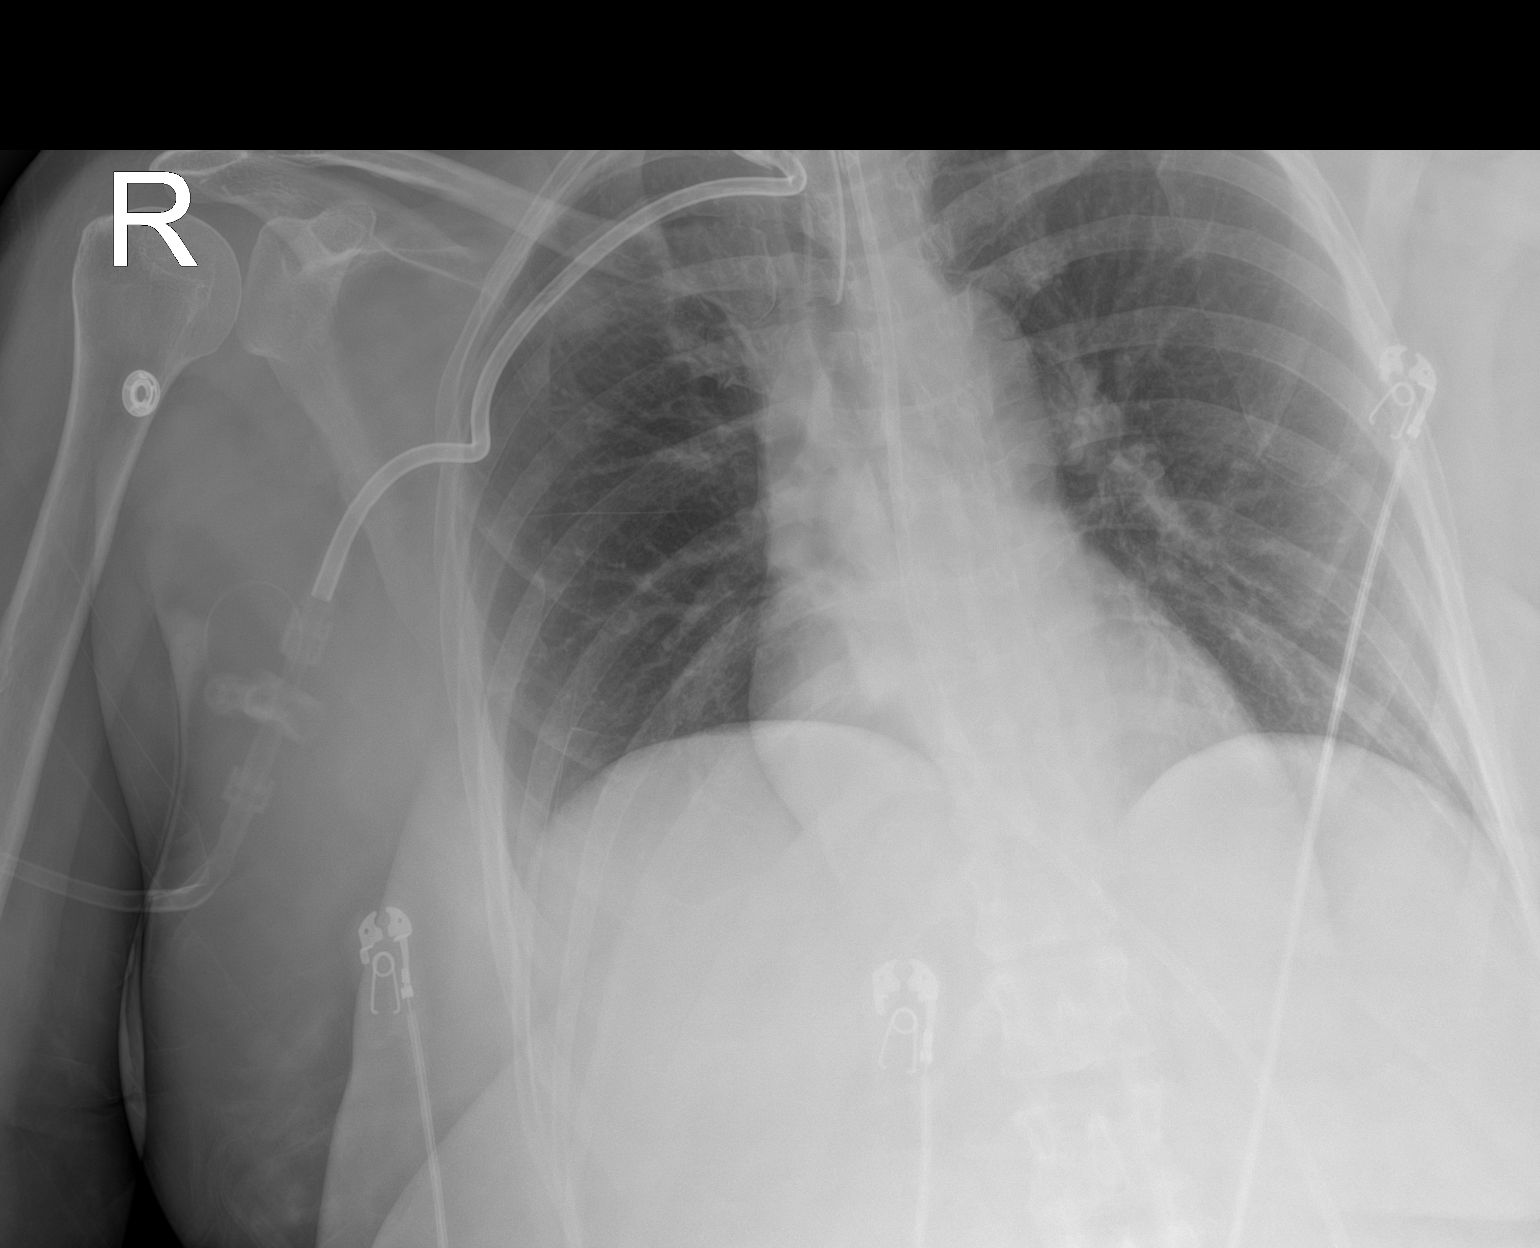

[2 of 2 positions shown; findings below may reference images not displayed]

FINDINGS: Since the prior study there is been interval placement of a
right-sided chest tube. Its distal tip is seen overlying the medial
aspect of the right apex. There is predominant stable endotracheal
tube and orogastric tube positioning. An ill-defined pulmonary
nodule is again seen within the right upper lobe. There is no
evidence of acute infiltrate or pleural effusion. A very small
residual right apical pneumothorax is seen. The heart size and
mediastinal contours are within normal limits. Both lungs are clear.
The visualized skeletal structures are unremarkable.
IMPRESSION: 1. Interval right-sided chest tube placement positioning, as
described above.
2. Very small residual right apical pneumothorax.
3. Stable right upper lobe pulmonary nodule.

## 2022-03-08 MED ORDER — GADOBUTROL 1 MMOL/ML IV SOLN
9.0000 mL | Freq: Once | INTRAVENOUS | Status: AC | PRN
  Administered 2022-03-08: 9 mL via INTRAVENOUS

## 2022-03-08 MED ORDER — DESMOPRESSIN ACETATE 4 MCG/ML IJ SOLN
0.2000 ug | Freq: Once | INTRAMUSCULAR | Status: AC
  Administered 2022-03-08: 0.2 ug via INTRAVENOUS
  Filled 2022-03-08: qty 1

## 2022-03-08 MED ORDER — DESMOPRESSIN ACETATE 0.1 MG PO TABS
0.1000 mg | ORAL_TABLET | Freq: Three times a day (TID) | ORAL | Status: DC
  Administered 2022-03-08 – 2022-03-11 (×9): 0.1 mg
  Filled 2022-03-08 (×10): qty 1

## 2022-03-08 MED ORDER — MIDAZOLAM HCL 2 MG/2ML IJ SOLN
INTRAMUSCULAR | Status: AC
  Filled 2022-03-08: qty 2

## 2022-03-08 MED ORDER — INSULIN GLARGINE-YFGN 100 UNIT/ML ~~LOC~~ SOLN
14.0000 [IU] | Freq: Two times a day (BID) | SUBCUTANEOUS | Status: DC
  Administered 2022-03-08 – 2022-03-10 (×5): 14 [IU] via SUBCUTANEOUS
  Filled 2022-03-08 (×6): qty 0.14

## 2022-03-08 MED ORDER — MIDAZOLAM HCL 2 MG/2ML IJ SOLN
1.0000 mg | Freq: Once | INTRAMUSCULAR | Status: AC
  Administered 2022-03-08: 1 mg via INTRAVENOUS

## 2022-03-08 MED ORDER — IOHEXOL 300 MG/ML  SOLN
75.0000 mL | Freq: Once | INTRAMUSCULAR | Status: AC | PRN
  Administered 2022-03-08: 75 mL via INTRAVENOUS

## 2022-03-08 NOTE — Progress Notes (Signed)
RT NOTE:  Pt transported to CT on vent without complications.

## 2022-03-08 NOTE — Progress Notes (Signed)
PT Cancellation Note  Patient Details Name: Gabrielle Vincent MRN: [id number] DOB: 1966/07/14   Cancelled Treatment:    Reason Eval/Treat Not Completed: Medical issues which prohibited therapy. Pt re-intubated yesterday, currently requiring pressor support. PT will follow up when the pt is more medically appropriate to mobilize.   [name] 03/08/2022, 7:40 AM

## 2022-03-08 NOTE — Progress Notes (Signed)
  Transition of Care [facility]) Screening Note   Patient Details  Name: Gabrielle Vincent Date of Birth: 01/14/1966   Transition of Care [facility]) CM/SW Contact:    Benard Halsted, LCSW Phone Number: 03/08/2022, 5:38 PM    Transition of Care Department [facility]) has reviewed patient is following for medical readiness for discharge planning (SNF recommended). We will continue to monitor patient advancement through interdisciplinary progression rounds.

## 2022-03-08 NOTE — Progress Notes (Signed)
OT Cancellation Note  Patient Details Name: Gabrielle Vincent MRN: [id number] DOB: 05-16-1966   Cancelled Treatment:    Reason Eval/Treat Not Completed: Medical issues which prohibited therapy (Medical decline; now intubated  with chest tube and pressors). Will check back later date.   Lee-Ann Gal,HILLARY 03/08/2022, 7:31 AM Maurie Boettcher, OT/L   Acute OT Clinical Specialist Acute Rehabilitation Services Pager [phone] Office [phone]

## 2022-03-08 NOTE — Progress Notes (Signed)
[facility]  Date of Admission:  02/14/2022           Reason for visit: Follow up on E coli epidural abscess   Current antibiotics: Ceftriaxone   ASSESSMENT:    56 y.o. female admitted with:  E coli complicated UTI with extensive epidural abscess extending from lumbar to cervical spine with associated meningitis: Status post cervical decompression and instrumentation C3-7 on 5/13 and thoracolumbar laminectomy and decompression epidural abscess on 5/18.  Repeat MRI shows significant decompression of the epidural space, but with ongoing residual fluid.  NSGY not sure that further surgical debridement will improve or resolve fevers.  MRI T and L spine 5/23 also noted enhancing lesions in the cerebellum.  MRI brain is pending for further assessment as this is concerning for development of brain abscesses. Fevers: Continues to be febrile.  Repeat blood cx NGTD.  PICC line removed 5/23 and her Foley remains in place.  Repeat MRI 5/23 notable for residual fluid as well as new concern for what may be a cerebral abscess given her clinical picture. Newly diagnosed DM: A1c is 8.9. Right upper lobe 2.2cm spiculating mass: Concerning for malignancy or the possibility of lung abscess has been raised.  CT chest obtained 5/24 with 2.3cm RUL nodule but not diagnostic, however, per CCM appears likely malignancy. Hypoxic respiratory failure: Required re-intubation 5/24 and CT chest notable for a right PTX requiring chest tube placement this morning.  Also suspected to be aspirating.   RECOMMENDATIONS:    Continue ceftriaxone as is Patient at MRI this morning Will follow   Principal Problem:   Abscess in epidural space of cervical spine Active Problems:   Sepsis secondary to UTI (HCC)   Hyponatremia   Hypokalemia   AKI (acute kidney injury) ([place])   Abnormal LFTs   Class II obesity   Hepatic steatosis   Cholelithiasis   Esophageal thickening   Acute low back pain    Encephalopathy acute   Acute respiratory failure (HCC)   Radiculopathy of lumbar region   E. coli UTI   Meningitis   Bacterial meningitis   Diabetes insipidus ([place])   H/O excision of lamina of cervical vertebra for decompression of spinal cord   Acute pyelonephritis   E coli bacteremia   Primary spontaneous pneumothorax    MEDICATIONS:    Scheduled Meds:  chlorhexidine gluconate (MEDLINE KIT)  15 mL Mouth Rinse BID   Chlorhexidine Gluconate Cloth  6 each Topical Daily   desmopressin  0.2 mcg Intravenous Once   desmopressin  0.1 mg Per Tube Q8H   feeding supplement (PROSource TF)  90 mL Per Tube BID   fentaNYL (SUBLIMAZE) injection  50 mcg Intravenous Once   free water  300 mL Per Tube Q2H   heparin injection (subcutaneous)  5,000 Units Subcutaneous Q8H   insulin aspart  0-15 Units Subcutaneous Q4H   insulin aspart  8 Units Subcutaneous Q4H   insulin glargine-yfgn  28 Units Subcutaneous BID   mouth rinse  15 mL Mouth Rinse 10 times per day   midodrine  10 mg Per Tube TID WC   pantoprazole sodium  40 mg Per Tube Q1400   senna  1 tablet Per Tube BID   zinc oxide   Topical BID   Continuous Infusions:  sodium chloride 20 mL/hr at 03/08/22 0800   cefTRIAXone (ROCEPHIN)  IV Stopped (03/07/22 2135)   dexmedetomidine (PRECEDEX) IV infusion 0.4 mcg/kg/hr (03/08/22 0800)   feeding supplement (  JEVITY 1.5 CAL/FIBER) Stopped (03/07/22 1956)   fentaNYL infusion INTRAVENOUS 50 mcg/hr (03/08/22 0800)   norepinephrine (LEVOPHED) Adult infusion 4 mcg/min (03/08/22 0800)   PRN Meds:.acetaminophen (TYLENOL) oral liquid 160 mg/5 mL, acetaminophen **OR** acetaminophen, bisacodyl, docusate, fentaNYL, fentaNYL (SUBLIMAZE) injection, lip balm, menthol-cetylpyridinium **OR** phenol, [DISCONTINUED] ondansetron **OR** ondansetron (ZOFRAN) IV, ondansetron **OR** [DISCONTINUED] ondansetron (ZOFRAN) IV, oxyCODONE, polyethylene glycol  SUBJECTIVE:   24 hour events:  Continued fevers Tmax  102.8 Intubated overnight CT chest --> pneumo --> chest tube Requiring pressors Increased WBC MRI brain pending  Patient at MRI this morning    OBJECTIVE:   Blood pressure 114/61, pulse 94, temperature (!) 102.8 F (39.3 C), temperature source Axillary, resp. rate (!) 25, height 5' 4"  (1.626 m), weight 89.7 kg, SpO2 96 %. Body mass index is 33.94 kg/m.    Lab Results: Lab Results  Component Value Date   WBC 14.0 (H) 03/08/2022   HGB 10.8 (L) 03/08/2022   HCT 35.6 (L) 03/08/2022   MCV 96.7 03/08/2022   PLT 297 03/08/2022    Lab Results  Component Value Date   NA 159 (H) 03/08/2022   K 4.5 03/08/2022   CO2 26 03/08/2022   GLUCOSE 143 (H) 03/08/2022   BUN 30 (H) 03/08/2022   CREATININE 1.29 (H) 03/08/2022   CALCIUM 8.8 (L) 03/08/2022   GFRNONAA 49 (L) 03/08/2022    Lab Results  Component Value Date   ALT 15 03/03/2022   AST 18 03/03/2022   ALKPHOS 90 03/03/2022   BILITOT 0.7 03/03/2022    No results found for: CRP  No results found for: ESRSEDRATE   I have reviewed the micro and lab results in Epic.  Imaging: CT CHEST W CONTRAST  Result Date: 03/08/2022 CLINICAL DATA:  Abnormal x-ray, lung nodule. EXAM: CT CHEST WITH CONTRAST TECHNIQUE: Multidetector CT imaging of the chest was performed during intravenous contrast administration. RADIATION DOSE REDUCTION: This exam was performed according to the departmental dose-optimization program which includes automated exposure control, adjustment of the mA and/or kV according to patient size and/or use of iterative reconstruction technique. CONTRAST:  74m OMNIPAQUE IOHEXOL 300 MG/ML  SOLN COMPARISON:  03/07/2022. FINDINGS: Cardiovascular: The heart is normal in size and there is no pericardial effusion. The aorta and pulmonary trunk are normal in caliber. Mediastinum/Nodes: Thyroid gland is within normal limits. An endotracheal tube is noted. An enteric tube is seen in the esophagus and proximal stomach. No mediastinal,  hilar, or axillary lymphadenopathy. Lungs/Pleura: There is a moderate-to-large right pneumothorax anteriorly with right lower lobe collapse. Nodular opacity is noted in the right upper lobe measuring 2.3 cm, axial image 35. Dependent atelectasis is noted in the left lung. No effusion is identified. Upper Abdomen: Fatty infiltration of the liver is noted. The spleen is mildly enlarged. Musculoskeletal: Posterior cervical spinal fusion hardware and laminectomy changes are noted. No acute fracture is seen. IMPRESSION: 1. Moderate to large right pneumothorax anteriorly with right lower lobe collapse. 2. 2.3 cm nodule in the right upper lobe which may represent pulmonary hemorrhage, versus infectious, inflammatory, or neoplastic process. 3. Hepatic steatosis. 4. Mild splenomegaly. Critical findings were reported to Dr. JMauri Brooklynat 1:53 a.m. Electronically Signed   By: LBrett FairyM.D.   On: 03/08/2022 01:56   MR CERVICAL SPINE W WO CONTRAST  Result Date: 03/07/2022 CLINICAL DATA:  Epidural abscess EXAM: MRI CERVICAL AND THORACIC SPINE WITHOUT AND WITH CONTRAST TECHNIQUE: Multiplanar and multiecho pulse sequences of the cervical spine, to include the craniocervical junction and  cervicothoracic junction, and the thoracic spine, were obtained without and with intravenous contrast. CONTRAST:  59m GADAVIST GADOBUTROL 1 MMOL/ML IV SOLN COMPARISON:  02/23/2022 FINDINGS: Evaluation is somewhat limited by motion artifact. MRI CERVICAL SPINE FINDINGS Alignment: Straightening of the normal cervical lordosis. No significant listhesis. Vertebrae: Status post interval posterior decompression and fixation C3-C7. Susceptibility artifact from the hardware limits evaluation at these levels. Increased T2 signal and enhancement about the C4-C5 and C5-C6 discs and endplates, without definite loss of cortical signal. Cord: Evaluation is limited by susceptibility. The imaged portions of the cord are normal in signal and morphology.  Previously noted epidural abscess is no longer seen. Posterior Fossa, vertebral arteries, paraspinal tissues: Extensive edema in the soft tissues posterior to the cervical spine with a small fluid collection in the subcutaneous fat (series 9, image 23). Additional small fluid collection about the spinous process of T1 (series 9, image 34). These are not unexpected postoperatively. No evidence of a deeper fluid collection near the thecal sac. Enhancing lesions are seen in the cerebellum (series 23, image 7), which were not on the prior exam, incompletely evaluated. Decreased leptomeningeal enhancement about the base of the brain and posterior fossa. Disc levels: C2-C3: No significant disc bulge. No spinal canal stenosis or neuroforaminal narrowing. C3-C4: Status post decompression. No spinal canal stenosis or neural foraminal narrowing. C4-C5: Disc height loss with disc osteophyte complex. Status post decompression. No spinal canal stenosis or neural foraminal narrowing. C5-C6: Disc height loss with disc osteophyte complex. Status post decompression. No spinal canal stenosis or definite neural foraminal narrowing. C6-C7: Disc height loss with disc osteophyte complex. Status post decompression. No spinal canal stenosis. Moderate left neural foraminal narrowing. C7-T1: Small right paracentral disc protrusion. No spinal canal stenosis or neural foraminal narrowing. MRI THORACIC SPINE FINDINGS Alignment:  Physiologic. Vertebrae: Diffusely decreased marrow signal which is nonspecific but can be seen with anemia, smoking, and obesity. No acute fracture or suspicious osseous lesion. Cord: Previously noted epidural collection is now not definitively seen until the level of T11, where it is significantly decreased in size, status post interval left hemilaminectomy and drainage at T11-T12. The collection measures up to 5 mm at the level of T12, previously 9 mm when remeasured similarly. The collection does appear to extend off  the inferior field of view into the lumbar epidural space. Paraspinal and other soft tissues: Postsurgical changes posterior to T11 and T12. No evidence of significant fluid collection in the soft tissues. Disc levels: No significant residual spinal canal stenosis in the thoracic spine. IMPRESSION: 1. Evaluation is somewhat limited by motion artifact. Within this limitation, there has been interval posterior decompression and fixation at C3-C7 and left hemilaminectomy at T11-T12, with drainage of the majority of the previously noted epidural abscess. Epidural collection is now seen from the level of T11, extending off the inferior field of view in the lumbar spine, decreased in size from the prior exam, measuring up to 5 mm at the level of T12, previously 9 mm when remeasured similarly. 2. Enhancing lesions are noted in the imaged portion of the cerebellum, which is incompletely evaluated. An MRI with and without contrast is recommended for further evaluation. 3. Edema in the soft tissues posterior to the cervical spine with a small fluid collection in the subcutaneous fat and about the spinous process of T1, which are not unexpected postoperatively. No evidence of a deeper fluid collection near the thecal sac or abscess. 4. Evaluation of the cervical spine is limited by susceptibility, however  there appears to be increased T2 signal and enhancement about the C4-C5 and C5-C6 discs and endplates, without definite loss cortical signal. This may be inflammatory or degenerative but could also be seen with early discitis osteomyelitis. Attention on follow-up. 5. No residual spinal canal stenosis in the cervical or thoracic spine. These results will be called to the ordering clinician or representative by the Radiologist Assistant, and communication documented in the PACS or [employer]. Electronically Signed   By: Merilyn Baba M.D.   On: 03/07/2022 01:44   MR THORACIC SPINE W WO CONTRAST  Result Date:  03/07/2022 CLINICAL DATA:  Epidural abscess EXAM: MRI CERVICAL AND THORACIC SPINE WITHOUT AND WITH CONTRAST TECHNIQUE: Multiplanar and multiecho pulse sequences of the cervical spine, to include the craniocervical junction and cervicothoracic junction, and the thoracic spine, were obtained without and with intravenous contrast. CONTRAST:  61m GADAVIST GADOBUTROL 1 MMOL/ML IV SOLN COMPARISON:  02/23/2022 FINDINGS: Evaluation is somewhat limited by motion artifact. MRI CERVICAL SPINE FINDINGS Alignment: Straightening of the normal cervical lordosis. No significant listhesis. Vertebrae: Status post interval posterior decompression and fixation C3-C7. Susceptibility artifact from the hardware limits evaluation at these levels. Increased T2 signal and enhancement about the C4-C5 and C5-C6 discs and endplates, without definite loss of cortical signal. Cord: Evaluation is limited by susceptibility. The imaged portions of the cord are normal in signal and morphology. Previously noted epidural abscess is no longer seen. Posterior Fossa, vertebral arteries, paraspinal tissues: Extensive edema in the soft tissues posterior to the cervical spine with a small fluid collection in the subcutaneous fat (series 9, image 23). Additional small fluid collection about the spinous process of T1 (series 9, image 34). These are not unexpected postoperatively. No evidence of a deeper fluid collection near the thecal sac. Enhancing lesions are seen in the cerebellum (series 23, image 7), which were not on the prior exam, incompletely evaluated. Decreased leptomeningeal enhancement about the base of the brain and posterior fossa. Disc levels: C2-C3: No significant disc bulge. No spinal canal stenosis or neuroforaminal narrowing. C3-C4: Status post decompression. No spinal canal stenosis or neural foraminal narrowing. C4-C5: Disc height loss with disc osteophyte complex. Status post decompression. No spinal canal stenosis or neural foraminal  narrowing. C5-C6: Disc height loss with disc osteophyte complex. Status post decompression. No spinal canal stenosis or definite neural foraminal narrowing. C6-C7: Disc height loss with disc osteophyte complex. Status post decompression. No spinal canal stenosis. Moderate left neural foraminal narrowing. C7-T1: Small right paracentral disc protrusion. No spinal canal stenosis or neural foraminal narrowing. MRI THORACIC SPINE FINDINGS Alignment:  Physiologic. Vertebrae: Diffusely decreased marrow signal which is nonspecific but can be seen with anemia, smoking, and obesity. No acute fracture or suspicious osseous lesion. Cord: Previously noted epidural collection is now not definitively seen until the level of T11, where it is significantly decreased in size, status post interval left hemilaminectomy and drainage at T11-T12. The collection measures up to 5 mm at the level of T12, previously 9 mm when remeasured similarly. The collection does appear to extend off the inferior field of view into the lumbar epidural space. Paraspinal and other soft tissues: Postsurgical changes posterior to T11 and T12. No evidence of significant fluid collection in the soft tissues. Disc levels: No significant residual spinal canal stenosis in the thoracic spine. IMPRESSION: 1. Evaluation is somewhat limited by motion artifact. Within this limitation, there has been interval posterior decompression and fixation at C3-C7 and left hemilaminectomy at T11-T12, with drainage of the majority  of the previously noted epidural abscess. Epidural collection is now seen from the level of T11, extending off the inferior field of view in the lumbar spine, decreased in size from the prior exam, measuring up to 5 mm at the level of T12, previously 9 mm when remeasured similarly. 2. Enhancing lesions are noted in the imaged portion of the cerebellum, which is incompletely evaluated. An MRI with and without contrast is recommended for further  evaluation. 3. Edema in the soft tissues posterior to the cervical spine with a small fluid collection in the subcutaneous fat and about the spinous process of T1, which are not unexpected postoperatively. No evidence of a deeper fluid collection near the thecal sac or abscess. 4. Evaluation of the cervical spine is limited by susceptibility, however there appears to be increased T2 signal and enhancement about the C4-C5 and C5-C6 discs and endplates, without definite loss cortical signal. This may be inflammatory or degenerative but could also be seen with early discitis osteomyelitis. Attention on follow-up. 5. No residual spinal canal stenosis in the cervical or thoracic spine. These results will be called to the ordering clinician or representative by the Radiologist Assistant, and communication documented in the PACS or [employer]. Electronically Signed   By: Merilyn Baba M.D.   On: 03/07/2022 01:44   DG CHEST PORT 1 VIEW  Result Date: 03/08/2022 CLINICAL DATA:  Status post chest tube placement. EXAM: PORTABLE CHEST 1 VIEW COMPARISON:  Mar 07, 2022 FINDINGS: Since the prior study there is been interval placement of a right-sided chest tube. Its distal tip is seen overlying the medial aspect of the right apex. There is predominant stable endotracheal tube and orogastric tube positioning. An ill-defined pulmonary nodule is again seen within the right upper lobe. There is no evidence of acute infiltrate or pleural effusion. A very small residual right apical pneumothorax is seen. The heart size and mediastinal contours are within normal limits. Both lungs are clear. The visualized skeletal structures are unremarkable. IMPRESSION: 1. Interval right-sided chest tube placement positioning, as described above. 2. Very small residual right apical pneumothorax. 3. Stable right upper lobe pulmonary nodule. Electronically Signed   By: Virgina Norfolk M.D.   On: 03/08/2022 03:07   DG Chest Port 1  View  Result Date: 03/07/2022 CLINICAL DATA:  Status post intubation. EXAM: PORTABLE CHEST 1 VIEW COMPARISON:  02/26/2022 FINDINGS: The endotracheal tube tip is directed towards the right mainstem bronchus. Consider withdrawing by approximately 1.5-2 cm. There is a feeding tube with tip well below the level of the GE junction. Heart size and mediastinal contours are normal. Right upper lobe pulmonary nodule with spiculated margins is identified. Previously obscured by overlying cardiac leads. This measures approximately 1.4 cm. IMPRESSION: 1. Endotracheal tube tip is directed towards the right mainstem bronchus. Consider withdrawing by approximately 1.5-2 cm. 2. Persistent right upper lobe pulmonary nodule, previously obscured by overlying cardiac leads but noted on previous thoracic MRI. Recommend further evaluation with CT of the chest. 3. These results will be called to the ordering clinician or representative by the Radiologist Assistant, and communication documented in the PACS or [employer]. Electronically Signed   By: Kerby Moors M.D.   On: 03/07/2022 18:28     Imaging independently reviewed in Epic.    [facility] [phone] pager 03/08/2022, 8:32 AM

## 2022-03-08 NOTE — Progress Notes (Signed)
[facility] Progress Note Patient Name: Gabrielle Vincent DOB: 1966-03-04 MRN: [id number]   Date of Service  03/08/2022  HPI/Events of Note  Moderate right pneumothorax noted on chest ct.  No midline shift.  Patient stable from hemodynamic and respiratory standpoint.  eICU Interventions  Ground team contacted for chest tube     Intervention Category Major Interventions: Other:  Mauri Brooklyn, P 03/08/2022, 2:00 AM

## 2022-03-08 NOTE — Procedures (Signed)
Insertion of Chest Tube Procedure Note  Gabrielle Vincent  [id number]  March 21, 1966  Date:03/08/22  Time:2:52 AM    Provider Performing: Montey Hora   Procedure: Chest Tube Insertion (40981)  Indication(s) Pneumothorax  Consent Risks of the procedure as well as the alternatives and risks of each were explained to the patient and/or caregiver.  Consent for the procedure was obtained and is signed in the bedside chart  Anesthesia Topical only with 1% lidocaine    Time Out Verified patient identification, verified procedure, site/side was marked, verified correct patient position, special equipment/implants available, medications/allergies/relevant history reviewed, required imaging and test results available.   Sterile Technique Maximal sterile technique including full sterile barrier drape, hand hygiene, sterile gown, sterile gloves, mask, hair covering, sterile ultrasound probe cover (if used).   Procedure Description Ultrasound not used to identify appropriate pleural anatomy for placement and overlying skin marked. Area of placement cleaned and draped in sterile fashion.  A 14 French pigtail pleural catheter was placed into the right pleural space using Seldinger technique. Appropriate return of air was obtained.  The tube was connected to atrium and placed on -20 cm H2O wall suction.   Complications/Tolerance None; patient tolerated the procedure well. Chest X-ray is ordered to verify placement.   EBL Minimal  Specimen(s) none   Montey Hora, PA - [facility] For pager details, please see AMION or use Epic chat  After 1900, please call [facility] for cross coverage needs 03/08/2022, 2:53 AM

## 2022-03-08 NOTE — Progress Notes (Signed)
   03/08/22 0800  Vitals  Temp (!) (S)  102.8 F (39.3 C)  Temp Source Axillary   Discussed increased temp with Dr. Tacy Learn as well as fevers overnight with no response to IV tylenol or ice packs. Plan to begin [facility] when pt returns from MRI for targeted temperature management.

## 2022-03-08 NOTE — Progress Notes (Signed)
SLP Cancellation Note  Patient Details Name: Gabrielle Vincent MRN: [id number] DOB: 01/23/1966   Cancelled treatment:       Reason Eval/Treat Not Completed: Patient not medically ready. Pt now intubated. Will sign off and await new orders as appropriate.    Aramis Zobel, Katherene Ponto 03/08/2022, 9:01 AM

## 2022-03-08 NOTE — Progress Notes (Signed)
NAME:  Gabrielle Vincent, MRN:  [id number], DOB:  18-Feb-1966, LOS: 34 ADMISSION DATE:  02/14/2022, CONSULTATION DATE:  5/5 REFERRING MD:  Rai, CHIEF COMPLAINT:  acute encephalopathy and sepsis    History of Present Illness:  56 year old female who was admitted on 5/3 with chief complaint of severe low back pain radiating down her left leg.  Also had associated hematuria and dysuria.  She had no weakness although low did say pain impacted her ability to walk.  Also noted poor p.o. intake, decreased urination as well as urinary retention, hematuria, dysuria, and some nausea.   CT scan was negative for obstructive uropathy did show fatty liver infiltrates, there is cholelithiasis but no acute cholecystitis there is mild circumferential thickening of the distal esophagus global enlargement of the uterus with thickened appearance of the endometrial stripe with radiology recommending nonemergent pelvic ultrasound.  Diagnostic evaluation notable for new AKI with serum creatinine 3.64, acute transaminitis leukocytosis, and hyponatremia of 127 she was admitted for further evaluation, cultures were sent, and she was started on IV ceftriaxone.  On 5/3 patient was noted to be more confused, speech was slurred , Working diagnosis was #1 urinary tract infection with resultant sepsis and also low back pain for which neurosurgery was consulted as MRI finding showed acute herniated L3-L4 disc with left lumbar radiculopathy.  It was felt that pain control via lumbar injection may help in that this could be treated conservatively.  On 5/4 patient becoming intermittently agitated then lethargic, because of this a CT of head was obtained this was severely limited due to motion degraded meant but there was concern about hypodense area in the right basal ganglia section raising concern for acute infarct.  On 5/5 a rapid response was called the patient was severely agitated, pulled out IVs, pull out Foley catheter  Later that  afternoon mental status continued to worsen.  Neurology consult was obtained in addition to this infectious disease was consulted with urine growing E. coli and narrowed antibiotics to cefazolin stopped vancomycin and recommended supportive care because her mental status continued to decline, she had severe metabolic derangements, and we were unable to provide medical care on the [facility] ward she was transferred to the ICU for higher level of care.  On initial arrival she was agitated requiring several nurses to hold her down, tachypneic tachycardic would not follow commands had marked increased work of breathing.  An intraosseous line was placed in the right lower extremity, she was intubated for airway protection to facilitate further MRI imaging, and a right IJ triple-lumen catheter was placed  Pertinent  Medical History  Class II obesity, hepatic steatosis, seasonal allergies, varicose veins diverticulosis, HL   Significant Hospital Events: Including procedures, antibiotic start and stop dates in addition to other pertinent events   5/3 admitted with back pain and urinary tract infection , Further complicated by AKI hyponatremia and multiple metabolic derangements.   CT imaging for renal stones showed no acute abdominal pelvic findings there was no obstructive uropathy there was severe fatty liver disease there was cholelithiasis but no cholecystitis there is colonic diverticulosis but no diverticulitis, mild circumferential thickening of the esophagus globular enlargement of the uterus with radiology recommending nonemergent pelvic ultrasound found to have uterine fibroid abdominal Ultrasound showed fatty liver but was epididymides negative.   MRI of the lumbar spine showed small left subarticular disc extrusion with inferior migration at L3-L4 correlating with radiculopathy.  She was started on ceftriaxone cultures were sent 5/4 increased confusion CT  brain obtained raising concern for possible acute  infarct in the right basal ganglia 5/5 progressive encephalopathy , Worsening leukocytosis, hypernatremia, hyperchloremia, slowly improving renal function, slowly improving procalcitonin, moved to ICU due to delirium, intubated for airway protection and to facilitate MRI imaging right IJ central line placed due to limited IV access.  Seen by neurology, also seen by infectious disease with ceftriaxone changed to cefazolin 5/6 antibiotics changed to meningitis coverage given MRI findings of fluid in the ventricles.  Image guided LP ordered 5/8 LP done with purulent CSF, studies consistent with bacterial meningitis, culture growing gm + cocci 5/10 significant rise in sodium with massive urine output of greater than 7 L leading to concern for development of central DI.  DDAVP and volume resuscitation started 5/11 hypernatremia slowly downtrending, continue DDAVP and IV resuscitation per nephrology 5/12 Sodium downtrended to 147 this am 5/13 to OR for posterior cervical decompressive laminectomy and medial facetectomy C3-T1 for evacuation of epirudal abscess, posterior cervical arthrodesis C3-C7, segmental fixation C3-7 5/18 OR for thoacolumbar laminectomy and decompression of epidural abscess from T-L spine 5/19 extubated to [place]  Interim History / Subjective:  Patient continued to spike fever with Tmax 103.3 Patient was intubated yesterday, tachypnea and hypoxia likely due to persistent aspiration  Objective   Blood pressure 120/70, pulse 86, temperature (S) (!) 102.8 F (39.3 C), temperature source Axillary, resp. rate (!) 25, height '5\' 4"'$  (1.626 m), weight 89.7 kg, SpO2 98 %.    Vent Mode: PRVC FiO2 (%):  [40 %-100 %] 40 % Set Rate:  [18 bmp-25 bmp] 25 bmp Vt Set:  [440 mL] 440 mL PEEP:  [5 cmH20] 5 cmH20 Pressure Support:  [5 cmH20] 5 cmH20 Plateau Pressure:  [14 cmH20-18 cmH20] 16 cmH20   Intake/Output Summary (Last 24 hours) at 03/08/2022 1048 Last data filed at 03/08/2022 0900 Gross per  24 hour  Intake 1535.3 ml  Output 4325 ml  Net -2789.7 ml   Filed Weights   03/03/22 0424 03/07/22 0413 03/08/22 0500  Weight: 94.5 kg 91.2 kg 89.7 kg   Physical exam: General: Crtitically ill-appearing female, lying on the bed, orally intubated HEENT: [place]/AT, eyes anicteric.  ETT and Cortrak in place Neuro: Eyes closed, does not open, not following commands  Chest: Basal crackles more on right than left, no wheezes or rhonchi Heart: Regular rate and rhythm, no murmurs or gallops Abdomen: Soft, nontender, nondistended, bowel sounds present Skin: No rash   Resolved medical problems  Hypokalemia, resolved AKI due to septic ATN, resolved NAGMA - resolved  Anasarca with hypervolemia Shock liver superimposed on known fatty liver disease  Assessment & Plan:  Principal Problem:   Abscess in epidural space of cervical spine Active Problems:   Sepsis secondary to UTI (HCC)   Hyponatremia   Hypokalemia   AKI (acute kidney injury) ([place])   Abnormal LFTs   Class II obesity   Hepatic steatosis   Cholelithiasis   Esophageal thickening   Acute low back pain   Encephalopathy acute   Acute respiratory failure (HCC)   Radiculopathy of lumbar region   E. coli UTI   Meningitis   Bacterial meningitis   Diabetes insipidus ([place])   H/O excision of lamina of cervical vertebra for decompression of spinal cord   Acute pyelonephritis   E coli bacteremia   Primary spontaneous pneumothorax   Severe sepsis with septic shock secondary to E. Coli- UTI , spinal abscess, and persistent meningitis and encephalitis Acute bacterial meningitis with E. Coli due to  direct extension from epidural abscess Extensive epidural abscess extending from foramen magnum to the sacral spine s/p C3-T1 laminectomy and evacuation of abscess, s/p thoracolumbar laminectomy and decompression of epidural abcess from thoracolumbar spine on 5/18. 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 Patient was started back on IV vasopressors due to hypotension She continues remain febrile now Tmax is 103.3 MRI cervical and thoracic spine was repeated which showed stable decompression of spine with some residual collection but better than before Discussed with neurosurgery, they recommend holding off on further surgical intervention Patient had MRI brain with and without contrast showing persistent meningitis with encephalitis Also MRI brain confirmed acute central pontine stroke Appreciate ID follow-up Continue IV ceftriaxone Continue midodrine  Acute septic encephalopathy Minimize sedation, currently on Precedex and fentanyl with RASS goal -1  Acute hypoxic respiratory failure Aspiration pneumonia Patient was noted to be tachypneic and hypoxic yesterday with encephalopathy After discussion with patient's family, decision was to proceed with endotracheal intubation and mechanical ventilation, during intubation visit will aspirate was noted in the trachea, suction CT chest confirmed aspiration pneumonia Also noted patient had lung nodule likely malignant, will discuss with my pulmonary colleagues Continue IV antibiotics Follow-up respiratory culture  Acute spontaneous right pneumothorax Post intubation x-ray showed no pneumothorax CT chest was repeated last night suggestive of moderate to large right-sided pneumothorax, anterior chest tube was placed with resolution Chest tube remains on suction at -20 cm Monitor airleak  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 Blood sugars are better controlled  now Tube feeds is on hold Decrease Lantus to 15 units Continue sliding scale with CBG goal 140-180  RUL nodule, concerning for primary lung cancer Chest CT was done, lung nodule is is likely malignancy We will speak with my pulmonary colleagues  Best Practice (right click and "Reselect all SmartList Selections" daily)   Diet/type: TF DVT prophylaxis: prophylactic heparin  GI prophylaxis: PPI Lines: PICC placed 5/15 Foley:  Yes, and it is still needed- DI Code Status:  full code Last date of multidisciplinary goals of care discussion: 5/19, IPAL note dictated 5/10  Critical care time:    Total critical care time: 38 minutes  Performed by: Jacky Kindle   Critical care time was exclusive of separately billable procedures and treating other patients.   Critical care was necessary to treat or prevent imminent or life-threatening deterioration.   Critical care was time spent personally by me on the following activities: development of treatment plan with patient and/or surrogate as well as nursing, discussions with consultants, evaluation of patient's response to treatment, examination of patient, obtaining history from patient or surrogate, ordering and performing treatments and interventions, ordering and review of laboratory studies, ordering and review of radiographic studies, pulse oximetry and re-evaluation of patient's condition.   Jacky Kindle MD [facility] See Amion for pager If no response to pager, please call [phone] until 7pm After 7pm, Please call E-link [phone]

## 2022-03-09 ENCOUNTER — Inpatient Hospital Stay (HOSPITAL_COMMUNITY): Payer: BC Managed Care – PPO

## 2022-03-09 DIAGNOSIS — J690 Pneumonitis due to inhalation of food and vomit: Secondary | ICD-10-CM | POA: Diagnosis not present

## 2022-03-09 DIAGNOSIS — J9601 Acute respiratory failure with hypoxia: Secondary | ICD-10-CM | POA: Diagnosis not present

## 2022-03-09 DIAGNOSIS — A419 Sepsis, unspecified organism: Secondary | ICD-10-CM | POA: Diagnosis not present

## 2022-03-09 DIAGNOSIS — N179 Acute kidney failure, unspecified: Secondary | ICD-10-CM | POA: Diagnosis not present

## 2022-03-09 DIAGNOSIS — E232 Diabetes insipidus: Secondary | ICD-10-CM | POA: Diagnosis not present

## 2022-03-09 DIAGNOSIS — G009 Bacterial meningitis, unspecified: Secondary | ICD-10-CM | POA: Diagnosis not present

## 2022-03-09 DIAGNOSIS — G061 Intraspinal abscess and granuloma: Secondary | ICD-10-CM | POA: Diagnosis not present

## 2022-03-09 LAB — CULTURE, BLOOD (ROUTINE X 2)
Culture: NO GROWTH
Culture: NO GROWTH
Special Requests: ADEQUATE

## 2022-03-09 LAB — BASIC METABOLIC PANEL
Anion gap: 13 (ref 5–15)
BUN: 41 mg/dL — ABNORMAL HIGH (ref 6–20)
CO2: 21 mmol/L — ABNORMAL LOW (ref 22–32)
Calcium: 8.7 mg/dL — ABNORMAL LOW (ref 8.9–10.3)
Chloride: 119 mmol/L — ABNORMAL HIGH (ref 98–111)
Creatinine, Ser: 1.29 mg/dL — ABNORMAL HIGH (ref 0.44–1.00)
GFR, Estimated: 49 mL/min — ABNORMAL LOW (ref >60)
Glucose, Bld: 178 mg/dL — ABNORMAL HIGH (ref 70–99)
Potassium: 4.6 mmol/L (ref 3.5–5.1)
Sodium: 153 mmol/L — ABNORMAL HIGH (ref 135–145)

## 2022-03-09 LAB — GLUCOSE, CAPILLARY
Glucose-Capillary: 132 mg/dL — ABNORMAL HIGH (ref 70–99)
Glucose-Capillary: 136 mg/dL — ABNORMAL HIGH (ref 70–99)
Glucose-Capillary: 139 mg/dL — ABNORMAL HIGH (ref 70–99)
Glucose-Capillary: 144 mg/dL — ABNORMAL HIGH (ref 70–99)
Glucose-Capillary: 153 mg/dL — ABNORMAL HIGH (ref 70–99)
Glucose-Capillary: 158 mg/dL — ABNORMAL HIGH (ref 70–99)
Glucose-Capillary: 176 mg/dL — ABNORMAL HIGH (ref 70–99)

## 2022-03-09 IMAGING — DX DG CHEST 1V PORT
1 series · 1 of 1 positions shown · non-contrast
Comparison: Chest x-ray dated [DATE]

CLINICAL DATA: Status post trach

EXAM:
PORTABLE CHEST 1 VIEW

[chest]
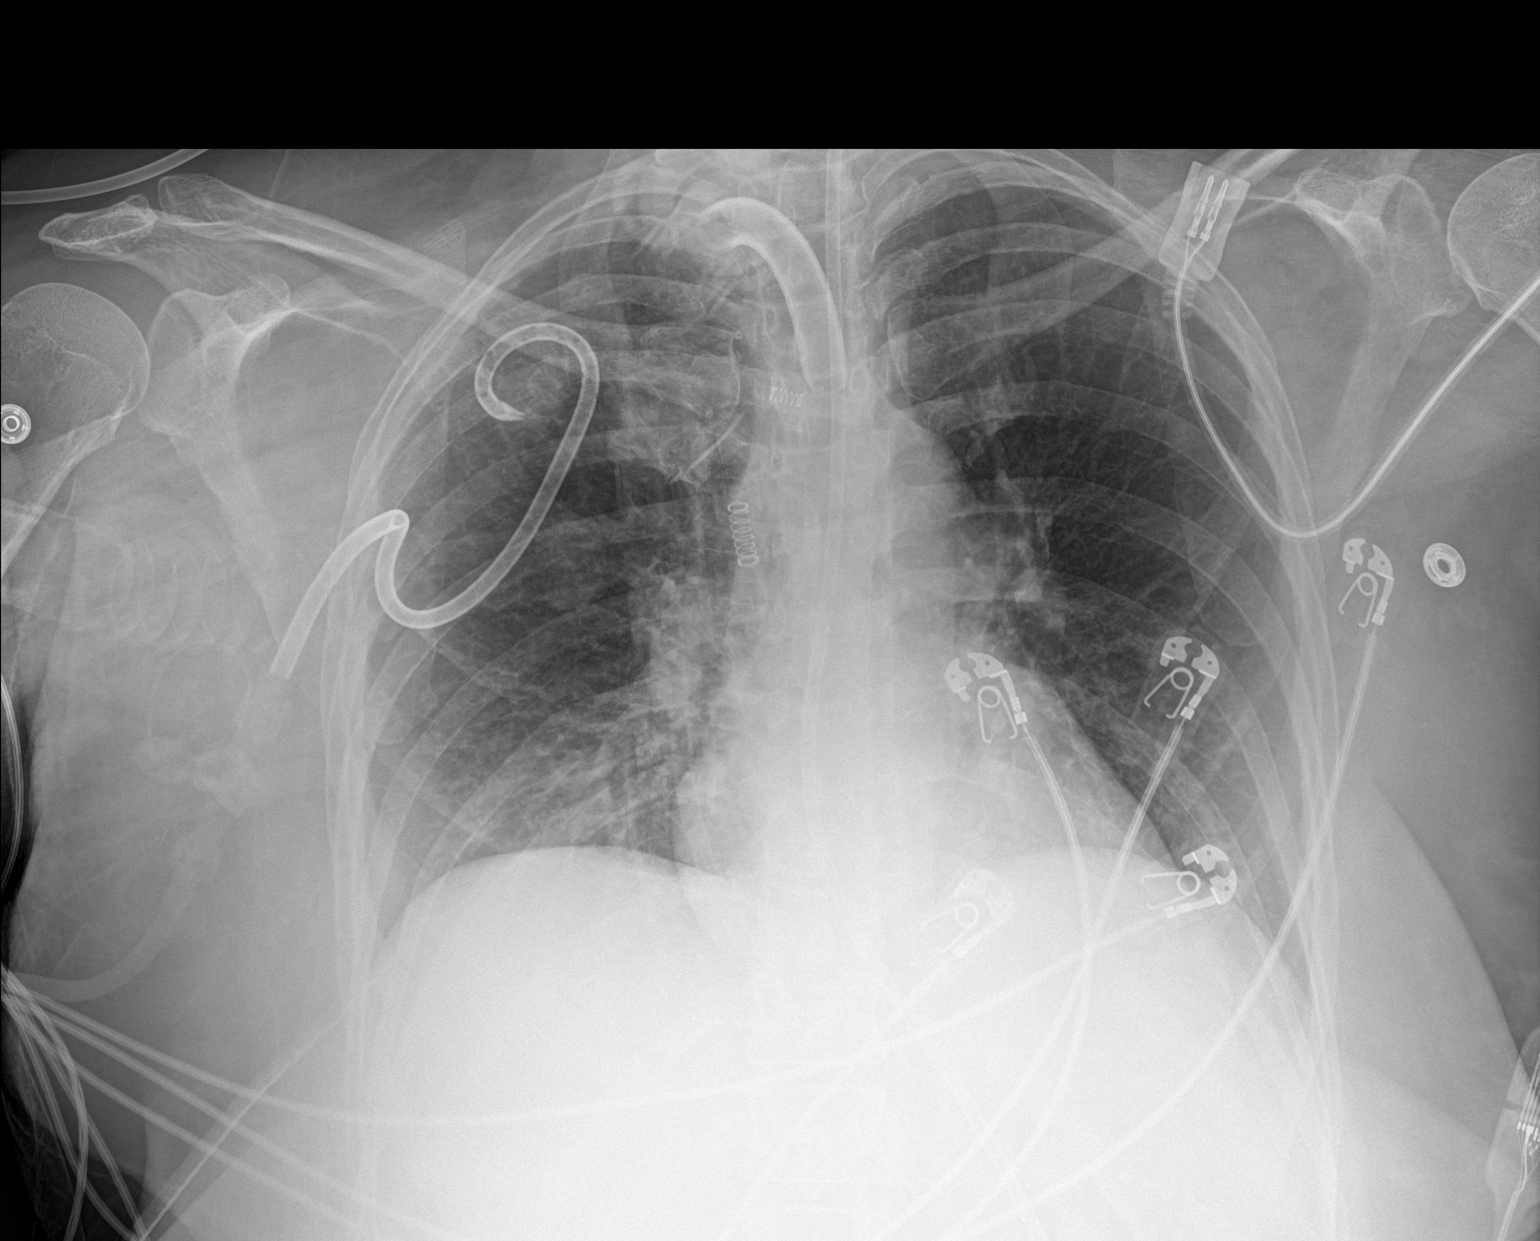

[1 of 1 positions shown; findings below may reference images not displayed]

FINDINGS: Interval placement of tracheostomy tube with tip projecting over the
midthoracic trachea. Feeding tube partially visualized coursing
below the diaphragm.

Cardiac and mediastinal contours within normal limits. New mild
right basilar opacity. No large pleural effusion or pneumothorax.
Right-sided chest tube in place. No evidence pneumothorax.
IMPRESSION: 1. Interval placement of tracheostomy tube.
2. New mild right basilar opacity, likely due to atelectasis,
infection or aspiration could appear similar.

## 2022-03-09 MED ORDER — ROCURONIUM BROMIDE 10 MG/ML (PF) SYRINGE
100.0000 mg | PREFILLED_SYRINGE | Freq: Once | INTRAVENOUS | Status: AC
  Administered 2022-03-09: 100 mg via INTRAVENOUS
  Filled 2022-03-09 (×2): qty 10

## 2022-03-09 MED ORDER — FENTANYL CITRATE PF 50 MCG/ML IJ SOSY
200.0000 ug | PREFILLED_SYRINGE | Freq: Once | INTRAMUSCULAR | Status: AC
  Filled 2022-03-09: qty 4

## 2022-03-09 MED ORDER — MIDAZOLAM HCL 2 MG/2ML IJ SOLN
5.0000 mg | Freq: Once | INTRAMUSCULAR | Status: AC
  Administered 2022-03-09: 6 mg via INTRAVENOUS
  Filled 2022-03-09: qty 6

## 2022-03-09 MED ORDER — JEVITY 1.5 CAL/FIBER PO LIQD
1000.0000 mL | ORAL | Status: DC
  Filled 2022-03-09: qty 1000

## 2022-03-09 MED ORDER — OSMOLITE 1.5 CAL PO LIQD
1000.0000 mL | ORAL | Status: DC
Start: 2022-03-09 — End: 2022-03-13
  Administered 2022-03-09 – 2022-03-12 (×5): 1000 mL

## 2022-03-09 MED ORDER — MIDAZOLAM HCL 2 MG/2ML IJ SOLN: 1.0000 mg | Freq: Once | INTRAMUSCULAR | Status: AC

## 2022-03-09 MED ORDER — PROSOURCE TF PO LIQD
90.0000 mL | Freq: Two times a day (BID) | ORAL | Status: DC
  Administered 2022-03-09 – 2022-03-21 (×24): 90 mL
  Filled 2022-03-09 (×24): qty 90

## 2022-03-09 MED ORDER — ETOMIDATE 2 MG/ML IV SOLN
20.0000 mg | Freq: Once | INTRAVENOUS | Status: AC
  Administered 2022-03-09: 20 mg via INTRAVENOUS
  Filled 2022-03-09: qty 10

## 2022-03-09 NOTE — Progress Notes (Signed)
Patient ID: Gabrielle Vincent, female   DOB: 1966/09/01, 56 y.o.   MRN: [id number] Temperature has been stable last 24 hours.  White count is gone up to 14,000.  Patient has been reintubated.  MRI of brain now shows evidence of pontine infarct.  Despite these things patient's level of consciousness appears to be quite good and she has increased some movement in her hands.  I reviewed the MRIs of her spine with Dr. Maree Erie and discussed areas of concern in the epidural space in the thoracic lumbar spine some enhancement in the disc space and facets around C5-C6 and some posterior meningeal enhancement.  No discrete surgical lesions are noted but these areas are concerning for persistent infection.  She remains on antibiotics and continue supportive care.  We will continue to follow along.  I will see the patient a self on Tuesday.  If there is any concern for acute neurosurgical intervention please contact the neurosurgeon on-call over the weekend.

## 2022-03-09 NOTE — Progress Notes (Signed)
NAME:  Saprina Chuong, MRN:  [id number], DOB:  1966-05-29, LOS: 75 ADMISSION DATE:  02/14/2022, CONSULTATION DATE:  5/5 REFERRING MD:  Rai, CHIEF COMPLAINT:  acute encephalopathy and sepsis    History of Present Illness:  56 year old female who was admitted on 5/3 with chief complaint of severe low back pain radiating down her left leg.  Also had associated hematuria and dysuria.  She had no weakness although low did say pain impacted her ability to walk.  Also noted poor p.o. intake, decreased urination as well as urinary retention, hematuria, dysuria, and some nausea.   CT scan was negative for obstructive uropathy did show fatty liver infiltrates, there is cholelithiasis but no acute cholecystitis there is mild circumferential thickening of the distal esophagus global enlargement of the uterus with thickened appearance of the endometrial stripe with radiology recommending nonemergent pelvic ultrasound.  Diagnostic evaluation notable for new AKI with serum creatinine 3.64, acute transaminitis leukocytosis, and hyponatremia of 127 she was admitted for further evaluation, cultures were sent, and she was started on IV ceftriaxone.  On 5/3 patient was noted to be more confused, speech was slurred , Working diagnosis was #1 urinary tract infection with resultant sepsis and also low back pain for which neurosurgery was consulted as MRI finding showed acute herniated L3-L4 disc with left lumbar radiculopathy.  It was felt that pain control via lumbar injection may help in that this could be treated conservatively.  On 5/4 patient becoming intermittently agitated then lethargic, because of this a CT of head was obtained this was severely limited due to motion degraded meant but there was concern about hypodense area in the right basal ganglia section raising concern for acute infarct.  On 5/5 a rapid response was called the patient was severely agitated, pulled out IVs, pull out Foley catheter  Later that  afternoon mental status continued to worsen.  Neurology consult was obtained in addition to this infectious disease was consulted with urine growing E. coli and narrowed antibiotics to cefazolin stopped vancomycin and recommended supportive care because her mental status continued to decline, she had severe metabolic derangements, and we were unable to provide medical care on the [facility] ward she was transferred to the ICU for higher level of care.  On initial arrival she was agitated requiring several nurses to hold her down, tachypneic tachycardic would not follow commands had marked increased work of breathing.  An intraosseous line was placed in the right lower extremity, she was intubated for airway protection to facilitate further MRI imaging, and a right IJ triple-lumen catheter was placed  Pertinent  Medical History  Class II obesity, hepatic steatosis, seasonal allergies, varicose veins diverticulosis, HL   Significant Hospital Events: Including procedures, antibiotic start and stop dates in addition to other pertinent events   5/3 admitted with back pain and urinary tract infection , Further complicated by AKI hyponatremia and multiple metabolic derangements.   CT imaging for renal stones showed no acute abdominal pelvic findings there was no obstructive uropathy there was severe fatty liver disease there was cholelithiasis but no cholecystitis there is colonic diverticulosis but no diverticulitis, mild circumferential thickening of the esophagus globular enlargement of the uterus with radiology recommending nonemergent pelvic ultrasound found to have uterine fibroid abdominal Ultrasound showed fatty liver but was epididymides negative.   MRI of the lumbar spine showed small left subarticular disc extrusion with inferior migration at L3-L4 correlating with radiculopathy.  She was started on ceftriaxone cultures were sent 5/4 increased confusion CT  brain obtained raising concern for possible acute  infarct in the right basal ganglia 5/5 progressive encephalopathy , Worsening leukocytosis, hypernatremia, hyperchloremia, slowly improving renal function, slowly improving procalcitonin, moved to ICU due to delirium, intubated for airway protection and to facilitate MRI imaging right IJ central line placed due to limited IV access.  Seen by neurology, also seen by infectious disease with ceftriaxone changed to cefazolin 5/6 antibiotics changed to meningitis coverage given MRI findings of fluid in the ventricles.  Image guided LP ordered 5/8 LP done with purulent CSF, studies consistent with bacterial meningitis, culture growing gm + cocci 5/10 significant rise in sodium with massive urine output of greater than 7 L leading to concern for development of central DI.  DDAVP and volume resuscitation started 5/11 hypernatremia slowly downtrending, continue DDAVP and IV resuscitation per nephrology 5/12 Sodium downtrended to 147 this am 5/13 to OR for posterior cervical decompressive laminectomy and medial facetectomy C3-T1 for evacuation of epirudal abscess, posterior cervical arthrodesis C3-C7, segmental fixation C3-7 5/18 OR for thoacolumbar laminectomy and decompression of epidural abscess from T-L spine 5/19 extubated to [facility] 5/24 reintubated 5/25 chest tube. MRI with ongoing meningitis + new area of ischemia in central pons  Interim History / Subjective:   NAEO   Na down to 153 this morning  Objective   Blood pressure 121/68, pulse 66, temperature 98.5 F (36.9 C), temperature source Bladder, resp. rate 18, height '5\' 4"'$  (1.626 m), weight 89.7 kg, SpO2 100 %.    Vent Mode: PRVC FiO2 (%):  [40 %] 40 % Set Rate:  [18 bmp-25 bmp] 18 bmp Vt Set:  [440 mL] 440 mL PEEP:  [5 cmH20] 5 cmH20 Plateau Pressure:  [15 cmH20-16 cmH20] 15 cmH20   Intake/Output Summary (Last 24 hours) at 03/09/2022 0958 Last data filed at 03/09/2022 0900 Gross per 24 hour  Intake 3845.23 ml  Output 2247 ml  Net  1598.23 ml   Filed Weights   03/03/22 0424 03/07/22 0413 03/08/22 0500  Weight: 94.5 kg 91.2 kg 89.7 kg   Physical exam: General: Critically ill middle agedF intubated NAD  HEENT: NCAT pink mm ETT secure anicteric sclera  Neuro: Awakens to voice. Not consistently following commands  Chest: Symmetrical chest expansion, mechanically ventilated. Chest tube  Heart: rrr s1s2 no rgm  Abdomen: soft ndnt + bowel sounds  Skin:c/d/w   Resolved medical problems  Hypokalemia, resolved AKI due to septic ATN, resolved NAGMA - resolved  Anasarca with hypervolemia Shock liver superimposed on known fatty liver disease  Assessment & Plan:  Principal Problem:   Abscess in epidural space of cervical spine Active Problems:   Sepsis secondary to UTI (HCC)   Hyponatremia   Hypokalemia   AKI (acute kidney injury) (HCC)   Abnormal LFTs   Class II obesity   Hepatic steatosis   Cholelithiasis   Esophageal thickening   Acute low back pain   Encephalopathy acute   Acute respiratory failure (HCC)   Radiculopathy of lumbar region   E. coli UTI   Meningitis   Bacterial meningitis   Diabetes insipidus ([place])   H/O excision of lamina of cervical vertebra for decompression of spinal cord   Acute pyelonephritis   E coli bacteremia   Primary spontaneous pneumothorax  Acute metabolic encephalopathy due to sepsis P -wean sedation as able RASS 0  to -1   Septic shock 2/2 E coli UTI, spinal abscess, persistent bacterial meningitis Paraplegia 2/2 spinal cord infarct  Acute on central pontine CVA  -s/p C3-T1  lami, evac of abscess, thoracolumbar lami/decompression P -wean NE  -BID rocephin, appreciate ID recs   Acute hypoxic respiratory failure Aspiration pneumonia Incidental finding of RUL 2.2cm spiculating appearing mass, concerning for ? Malignancy  Spontaneous R ptx  P -wean vent as tolerated -pulm hygiene -VAP -routine chest tube care   Central DI Hypernatremia  P -cont trending  UOP, BMP -DDAVP, FWF   AKI P -trend renal indices, UOP -FWF as above   DM2 poorly controlled  P -Basal + SSI     Best Practice (right click and "Reselect all SmartList Selections" daily)   Diet/type: TF DVT prophylaxis: prophylactic heparin  GI prophylaxis: PPI Lines: PICC placed 5/15 Foley:  Yes, and it is still needed- DI Code Status:  full code Last date of multidisciplinary goals of care discussion: 5/19, IPAL note dictated 5/10. CCM MD spoke with family at length 5/25 RE [place]    CRITICAL CARE Performed by: Cristal Generous  Total critical care time: 38 minutes  Critical care time was exclusive of separately billable procedures and treating other patients. Critical care was necessary to treat or prevent imminent or life-threatening deterioration.  Critical care was time spent personally by me on the following activities: development of treatment plan with patient and/or surrogate as well as nursing, discussions with consultants, evaluation of patient's response to treatment, examination of patient, obtaining history from patient or surrogate, ordering and performing treatments and interventions, ordering and review of laboratory studies, ordering and review of radiographic studies, pulse oximetry and re-evaluation of patient's condition.  Eliseo Gum MSN, AGACNP-BC [place] [phone] Amion for pager  03/09/2022, 9:58 AM

## 2022-03-09 NOTE — Progress Notes (Signed)
PT Cancellation Note  Patient Details Name: Gabrielle Vincent MRN: [id number] DOB: [birth year]   Cancelled Treatment:    Reason Eval/Treat Not Completed: Medical issues which prohibited therapy. Per RN plan for tracheostomy today. PT will follow up at a later time.   [name] 03/09/2022, 3:42 PM

## 2022-03-09 NOTE — Procedures (Signed)
Bronchoscopy Procedure Note  Gabrielle Vincent  [id number]  July 04, 1966  Date:03/09/22  Time:3:45 PM   Provider Performing:Nishtha Raider   Procedure(s):  Flexible bronchoscopy with bronchial alveolar lavage (25749)  Indication(s) Aspiration PNA  Consent Risks of the procedure as well as the alternatives and risks of each were explained to the patient and/or caregiver.  Consent for the procedure was obtained and is signed in the bedside chart  Anesthesia Etomidate and Rocuronium   Time Out Verified patient identification, verified procedure, site/side was marked, verified correct patient position, special equipment/implants available, medications/allergies/relevant history reviewed, required imaging and test results available.   Sterile Technique Usual hand hygiene, masks, gowns, and gloves were used   Procedure Description Bronchoscope advanced through tracheostomy tube and into airway.  Airways were examined down to subsegmental level with findings noted below.   Following diagnostic evaluation, BAL(s) performed in RLL with normal saline and return of [place] fluid  Findings: Moderate amount of thick secretions noted in RLL, suctioned and BAL was done   Complications/Tolerance None; patient tolerated the procedure well. Chest X-ray is needed post procedure.   EBL Minimal   Specimen(s) Sputum

## 2022-03-09 NOTE — Progress Notes (Signed)
Pharmacy Note  Pharmacy consulted to monitor antiplatelet start/resumption post-trach. Per discussion with Dr. Tacy Learn (CCM), plan to start antiplatelets 5/27.   Arturo Morton, PharmD, BCPS Please check AMION for all [place] contact numbers Clinical Pharmacist 03/09/2022 2:17 PM

## 2022-03-09 NOTE — Progress Notes (Signed)
Nutrition Follow-up  DOCUMENTATION CODES:   Obesity unspecified  INTERVENTION:   Tube feeding via cortrak tube:  Initiate Osmolite 1.5 @ 20 ml/hr and increase by 10 ml every 6 hours to goal rate of 50 ml/hr  Provides 1960 kcal, 119 gm protein, 912 ml free water daily  300 ml free water every 2 hours  Total free water: 4512 ml   NUTRITION DIAGNOSIS:   Inadequate oral intake related to inability to eat as evidenced by NPO status. Ongoing.   GOAL:   Provide needs based on ASPEN/SCCM guidelines Met with TF at goal   MONITOR:   Vent status, TF tolerance, Labs, Weight trends, Skin  REASON FOR ASSESSMENT:   Consult Enteral/tube feeding initiation and management  ASSESSMENT:   56 y.o. female with medical history of seasonal allergies, obesity, BLE swelling, varicose veins, HLD, hepatic steatosis, atherosclerosis, and diverticulosis. She presented to the ED due to severe low back pain that radiated to LLE and was associated with hematuria and dysuria. In the ED, she was noted to have multiple electrolyte abnormalities. CT renal stone study showed no obstructive uropathy, severe fatty infiltration of the liver with mild hepatomegaly, cholelithiasis with no acute cholecystitis, colonic diverticulosis, and mild circumferential thickening of the distal esophagus may represent esophagitis. She was admitted for sepsis 2/2 UTI.  Pt discussed during ICU rounds and with RN and MD. Plan for trach today per CCM.   ID following for e coli UTI with extensive epidural abscess and associated meningitis.  Pt with newly dx DM. 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  Pt on DDAVP for DI.  Per CCM pt with RUL nodule concerning ofr primary lung cancer.  Pt re-intubated; requires arctic sun due to fevers. Requiring low dose pressors   Admission weight: 95.3 kg Current weight: 89.7 kg  Mild edema noted   5/3 admitted with back pain and UTI 5/5 intubated with progressive  encephalopathy 5/8 s/p LP consistent with bacterial meningitis 5/10 developed DI due to meningitis  5/13 s/p OR for decompressive laminectomy and evacuation of epidural abscess 5/18 s/p lami and decompression of epidural abscess  5/19 extubated 5/22 s/p cortrak placement; tip in distal stomach  5/24 re-intubated; TF held 5/26 TF restarted; plan for trach    Medications reviewed and include: SSI, 8 units every 4 hours novolog, 14 units BID semglee, protonix, senokot Precedex  Fentanyl  Levophed @ 3 mcg  Labs reviewed: Na 153 Vitamin B12: 4495 (5/5)  CBG's: 132-176  UOP: 2050 ml  CT: 0 ml   Diet Order:   Diet Order             Diet NPO time specified  Diet effective now                   EDUCATION NEEDS:   No education needs have been identified at this time  Skin:  Skin Assessment:  (DTI: anus)  Last BM:  100 ml via rectal tube  Height:   Ht Readings from Last 1 Encounters:  03/07/22 5' 4"  (1.626 m)    Weight:   Wt Readings from Last 1 Encounters:  03/08/22 89.7 kg    Ideal Body Weight:  54.54 kg  BMI:  Body mass index is 33.94 kg/m.  Estimated Nutritional Needs:   Kcal:  1850-2100  Protein:  110-120 grams  Fluid:  >1.8 L/day  Lockie Pares., RD, LDN, CNSC See AMiON for contact information

## 2022-03-09 NOTE — Progress Notes (Signed)
[facility] for Infectious Disease  Date of Admission:  02/14/2022           Reason for visit: Follow up on E coli epidural abscess   Current antibiotics: Ceftriaxone  ASSESSMENT:    56 y.o. female admitted with:  E coli complicated UTI with extensive epidural abscess extending from lumbar to cervical spine with associated meningitis/encephalitis: Status post cervical decompression and instrumentation C3-7 on 5/13 and thoracolumbar laminectomy and decompression epidural abscess on 5/18.  Repeat MRI 5/23 shows significant decompression of the epidural space, but with ongoing residual fluid.  NSGY not sure that further surgical debridement will improve or resolve fevers.  MRI T and L spine 5/23 also noted enhancing lesions in the cerebellum.  MRI brain 5/25 for further assessment as this shows ongoing meningitis/encephalitis plus new area of a ischemia in the central pons. Newly diagnosed DM: A1c is 8.9. Right upper lobe 2.2cm spiculating mass: Concerning for malignancy or the possibility of lung abscess has been raised.  CT chest obtained 5/24 with 2.3cm RUL nodule but not diagnostic, however, per CCM appears likely malignancy. Hypoxic respiratory failure and aspiration pneumonia: Required re-intubation 5/24 and CT chest notable for a right PTX requiring chest tube placement this morning.  Also suspected to be aspirating.    RECOMMENDATIONS:    Continue ceftriaxone 2gm BID Lab monitoring, glycemic control, wound care Appreciate NSGY and CCM follow up Agree with goals of care discussion ongoing.  Sounds like family is leaning towards comfort care after the weekend  Dr Tommy Medal here as needed this weekend.  I will be back Tuesday.   Principal Problem:   Abscess in epidural space of cervical spine Active Problems:   Sepsis secondary to UTI (HCC)   Hyponatremia   Hypokalemia   AKI (acute kidney injury) (HCC)   Abnormal LFTs   Class II obesity   Hepatic steatosis    Cholelithiasis   Esophageal thickening   Acute low back pain   Encephalopathy acute   Acute respiratory failure (HCC)   Radiculopathy of lumbar region   E. coli UTI   Meningitis   Bacterial meningitis   Diabetes insipidus ([place])   H/O excision of lamina of cervical vertebra for decompression of spinal cord   Acute pyelonephritis   E coli bacteremia   Primary spontaneous pneumothorax    MEDICATIONS:    Scheduled Meds: . chlorhexidine gluconate (MEDLINE KIT)  15 mL Mouth Rinse BID  . Chlorhexidine Gluconate Cloth  6 each Topical Daily  . desmopressin  0.1 mg Per Tube Q8H  . feeding supplement (PROSource TF)  90 mL Per Tube BID  . fentaNYL (SUBLIMAZE) injection  50 mcg Intravenous Once  . free water  300 mL Per Tube Q2H  . heparin injection (subcutaneous)  5,000 Units Subcutaneous Q8H  . insulin aspart  0-15 Units Subcutaneous Q4H  . insulin aspart  8 Units Subcutaneous Q4H  . insulin glargine-yfgn  14 Units Subcutaneous BID  . mouth rinse  15 mL Mouth Rinse 10 times per day  . midodrine  10 mg Per Tube TID WC  . pantoprazole sodium  40 mg Per Tube Q1400  . senna  1 tablet Per Tube BID  . zinc oxide   Topical BID   Continuous Infusions: . sodium chloride Stopped (03/08/22 0820)  . cefTRIAXone (ROCEPHIN)  IV Stopped (03/08/22 2205)  . dexmedetomidine (PRECEDEX) IV infusion 0.7 mcg/kg/hr (03/09/22 0600)  . fentaNYL infusion INTRAVENOUS 150 mcg/hr (03/09/22 0600)  .  norepinephrine (LEVOPHED) Adult infusion 3 mcg/min (03/09/22 0600)   PRN Meds:.acetaminophen (TYLENOL) oral liquid 160 mg/5 mL, acetaminophen **OR** acetaminophen, bisacodyl, docusate, fentaNYL, fentaNYL (SUBLIMAZE) injection, lip balm, menthol-cetylpyridinium **OR** phenol, [DISCONTINUED] ondansetron **OR** ondansetron (ZOFRAN) IV, ondansetron **OR** [DISCONTINUED] ondansetron (ZOFRAN) IV, oxyCODONE, polyethylene glycol  SUBJECTIVE:   24 hour events:  No acute events are noted Tmax 101.5 yesterday morning Has  been on low dose Levophed MRI brain showed persistent meningitis/encephalitis and acute central pontine stroke.   Review of Systems  Unable to perform ROS: Intubated     OBJECTIVE:   Blood pressure 118/60, pulse 67, temperature 98.7 F (37.1 C), temperature source Bladder, resp. rate 18, height 5' 4"  (1.626 m), weight 89.7 kg, SpO2 100 %. Body mass index is 33.94 kg/m.  Physical Exam Constitutional:      Comments: She is intubated and sedated but her eyes are open and she is tracking.  She nods her head.   HENT:     Head: Normocephalic and atraumatic.     Nose:     Comments: NG tube Eyes:     Extraocular Movements: Extraocular movements intact.     Conjunctiva/sclera: Conjunctivae normal.  Cardiovascular:     Rate and Rhythm: Normal rate and regular rhythm.  Pulmonary:     Comments: Ventilated breath sounds.  Diminished at bases.  Abdominal:     General: There is no distension.     Palpations: Abdomen is soft.     Tenderness: There is no abdominal tenderness.  Skin:    General: Skin is warm and dry.     Findings: No rash.  Neurological:     Comments: Intubated, sedated.  Eyes open.     Lab Results: Lab Results  Component Value Date   WBC 14.0 (H) 03/08/2022   HGB 10.8 (L) 03/08/2022   HCT 35.6 (L) 03/08/2022   MCV 96.7 03/08/2022   PLT 297 03/08/2022    Lab Results  Component Value Date   NA 153 (H) 03/09/2022   K 4.6 03/09/2022   CO2 21 (L) 03/09/2022   GLUCOSE 178 (H) 03/09/2022   BUN 41 (H) 03/09/2022   CREATININE 1.29 (H) 03/09/2022   CALCIUM 8.7 (L) 03/09/2022   GFRNONAA 49 (L) 03/09/2022    Lab Results  Component Value Date   ALT 15 03/03/2022   AST 18 03/03/2022   ALKPHOS 90 03/03/2022   BILITOT 0.7 03/03/2022    No results found for: CRP  No results found for: ESRSEDRATE   I have reviewed the micro and lab results in Epic.  Imaging: CT CHEST W CONTRAST  Result Date: 03/08/2022 CLINICAL DATA:  Abnormal x-ray, lung nodule. EXAM: CT  CHEST WITH CONTRAST TECHNIQUE: Multidetector CT imaging of the chest was performed during intravenous contrast administration. RADIATION DOSE REDUCTION: This exam was performed according to the departmental dose-optimization program which includes automated exposure control, adjustment of the mA and/or kV according to patient size and/or use of iterative reconstruction technique. CONTRAST:  41m OMNIPAQUE IOHEXOL 300 MG/ML  SOLN COMPARISON:  03/07/2022. FINDINGS: Cardiovascular: The heart is normal in size and there is no pericardial effusion. The aorta and pulmonary trunk are normal in caliber. Mediastinum/Nodes: Thyroid gland is within normal limits. An endotracheal tube is noted. An enteric tube is seen in the esophagus and proximal stomach. No mediastinal, hilar, or axillary lymphadenopathy. Lungs/Pleura: There is a moderate-to-large right pneumothorax anteriorly with right lower lobe collapse. Nodular opacity is noted in the right upper lobe measuring 2.3 cm, axial image  35. Dependent atelectasis is noted in the left lung. No effusion is identified. Upper Abdomen: Fatty infiltration of the liver is noted. The spleen is mildly enlarged. Musculoskeletal: Posterior cervical spinal fusion hardware and laminectomy changes are noted. No acute fracture is seen. IMPRESSION: 1. Moderate to large right pneumothorax anteriorly with right lower lobe collapse. 2. 2.3 cm nodule in the right upper lobe which may represent pulmonary hemorrhage, versus infectious, inflammatory, or neoplastic process. 3. Hepatic steatosis. 4. Mild splenomegaly. Critical findings were reported to Dr. Mauri Brooklyn at 1:53 a.m. Electronically Signed   By: Brett Fairy M.D.   On: 03/08/2022 01:56   MR BRAIN W WO CONTRAST  Result Date: 03/08/2022 CLINICAL DATA:  Spinal meningitis and epidural abscess with abnormal intracranial findings on cervical spine MRI EXAM: MRI HEAD WITHOUT AND WITH CONTRAST TECHNIQUE: Multiplanar, multiecho pulse sequences  of the brain and surrounding structures were obtained without and with intravenous contrast. CONTRAST:  68m GADAVIST GADOBUTROL 1 MMOL/ML IV SOLN COMPARISON:  02/16/2022 MRI brain FINDINGS: Brain: There is diffusely abnormal sulcal signal on T2 FLAIR. There is increased underlying cortical/subcortical edema in the left frontal lobe involving middle and inferior gyri. There is some ill-defined enhancement in this region. Few small foci of enhancement identified in the cerebellum and were more evident on the cervical spine MRI. There is persistent small volume layering debris in the occipital horns with reduced diffusion. Ventricle caliber is slightly increased. New patchy diffusion hyperintensity in the central pons with corresponding T2 hyperintensity. No associated enhancement. Vascular: Major vessel flow voids at the skull base are preserved. Skull and upper cervical spine: Normal marrow signal is preserved. Sinuses/Orbits: Minor mucosal thickening.  Orbits are unremarkable. Other: Sella is unremarkable.  Patchy mastoid fluid opacification. IMPRESSION: Meningitis with mildly progressive abnormal signal in the peripheral left frontal lobe likely reflecting adjacent encephalitis. Few small foci of cerebellar enhancement are probably leptomeningeal and reflect meningitis. Persistent small volume layering debris in the lateral ventricles again reflecting meningitis. Ventricle caliber is slightly increased and there may be early hydrocephalus. New abnormal signal in the central pons reflecting late acute to subacute ischemia. Electronically Signed   By: PMacy MisM.D.   On: 03/08/2022 10:29   DG CHEST PORT 1 VIEW  Result Date: 03/08/2022 CLINICAL DATA:  Status post chest tube placement. EXAM: PORTABLE CHEST 1 VIEW COMPARISON:  Mar 07, 2022 FINDINGS: Since the prior study there is been interval placement of a right-sided chest tube. Its distal tip is seen overlying the medial aspect of the right apex. There is  predominant stable endotracheal tube and orogastric tube positioning. An ill-defined pulmonary nodule is again seen within the right upper lobe. There is no evidence of acute infiltrate or pleural effusion. A very small residual right apical pneumothorax is seen. The heart size and mediastinal contours are within normal limits. Both lungs are clear. The visualized skeletal structures are unremarkable. IMPRESSION: 1. Interval right-sided chest tube placement positioning, as described above. 2. Very small residual right apical pneumothorax. 3. Stable right upper lobe pulmonary nodule. Electronically Signed   By: TVirgina NorfolkM.D.   On: 03/08/2022 03:07   DG Chest Port 1 View  Result Date: 03/07/2022 CLINICAL DATA:  Status post intubation. EXAM: PORTABLE CHEST 1 VIEW COMPARISON:  02/26/2022 FINDINGS: The endotracheal tube tip is directed towards the right mainstem bronchus. Consider withdrawing by approximately 1.5-2 cm. There is a feeding tube with tip well below the level of the GE junction. Heart size and mediastinal contours are  normal. Right upper lobe pulmonary nodule with spiculated margins is identified. Previously obscured by overlying cardiac leads. This measures approximately 1.4 cm. IMPRESSION: 1. Endotracheal tube tip is directed towards the right mainstem bronchus. Consider withdrawing by approximately 1.5-2 cm. 2. Persistent right upper lobe pulmonary nodule, previously obscured by overlying cardiac leads but noted on previous thoracic MRI. Recommend further evaluation with CT of the chest. 3. These results will be called to the ordering clinician or representative by the Radiologist Assistant, and communication documented in the PACS or [employer]. Electronically Signed   By: Kerby Moors M.D.   On: 03/07/2022 18:28     Imaging independently reviewed in Epic.    Raynelle Highland for Infectious Disease [facility] [phone] pager 03/09/2022, 8:30  AM

## 2022-03-09 NOTE — Progress Notes (Signed)
OT Cancellation Note  Patient Details Name: Gabrielle Vincent MRN: [id number] DOB: Apr 10, 1966   Cancelled Treatment:    Reason Eval/Treat Not Completed: Medical issues which prohibited therapy.  Spoke with RN, advised to hold for today.  Will continue efforts as appropriate.    Jersey Ravenscroft D Arabel Barcenas 03/09/2022, 10:55 AM

## 2022-03-09 NOTE — Progress Notes (Signed)
PCCM:  I spoke with patients sister. She is agreeable to proceed with tracheostomy.   [facility] 03/09/2022 3:13 PM

## 2022-03-09 NOTE — Procedures (Signed)
Percutaneous Tracheostomy Procedure Note   Gabrielle Vincent  [id number]  1965/12/25  Date:03/09/22  Time:3:42 PM   Provider Performing:Jhayla Podgorski L Daimien Patmon  Procedure: Percutaneous Tracheostomy with Bronchoscopic Guidance ([id number])  Indication(s) Chronic respiratory failure   Consent Risks of the procedure as well as the alternatives and risks of each were explained to the patient and/or caregiver.  Consent for the procedure was obtained.  Anesthesia Etomidate, Versed, Fentanyl, Vecuronium   Time Out Verified patient identification, verified procedure, site/side was marked, verified correct patient position, special equipment/implants available, medications/allergies/relevant history reviewed, required imaging and test results available.   Sterile Technique Maximal sterile technique including sterile barrier drape, hand hygiene, sterile gown, sterile gloves, mask, hair covering.    Procedure Description Appropriate anatomy identified by palpation.  Patient's neck prepped and draped in sterile fashion.  1% lidocaine with epinephrine was used to anesthetize skin overlying neck.  1.5cm incision made and blunt dissection performed until tracheal rings could be easily palpated.   Then a size 6 Shiley tracheostomy was placed under bronchoscopic visualization using usual Seldinger technique and serial dilation.   Bronchoscope confirmed placement above the carina.  Tracheostomy was sutured in place with adhesive pad to protect skin under pressure.    Patient connected to ventilator.   Complications/Tolerance None; patient tolerated the procedure well. Chest X-ray is ordered to confirm no post-procedural complication.   EBL Minimal   Specimen(s) None   Garner Nash, DO [facility] 03/09/2022 3:43 PM

## 2022-03-10 DIAGNOSIS — A419 Sepsis, unspecified organism: Secondary | ICD-10-CM | POA: Diagnosis not present

## 2022-03-10 DIAGNOSIS — G009 Bacterial meningitis, unspecified: Secondary | ICD-10-CM | POA: Diagnosis not present

## 2022-03-10 DIAGNOSIS — N390 Urinary tract infection, site not specified: Secondary | ICD-10-CM | POA: Diagnosis not present

## 2022-03-10 DIAGNOSIS — G061 Intraspinal abscess and granuloma: Secondary | ICD-10-CM | POA: Diagnosis not present

## 2022-03-10 LAB — GLUCOSE, CAPILLARY
Glucose-Capillary: 151 mg/dL — ABNORMAL HIGH (ref 70–99)
Glucose-Capillary: 163 mg/dL — ABNORMAL HIGH (ref 70–99)
Glucose-Capillary: 178 mg/dL — ABNORMAL HIGH (ref 70–99)
Glucose-Capillary: 166 mg/dL — ABNORMAL HIGH (ref 70–99)
Glucose-Capillary: 166 mg/dL — ABNORMAL HIGH (ref 70–99)

## 2022-03-10 LAB — CBC
HCT: 31.6 % — ABNORMAL LOW (ref 36.0–46.0)
Hemoglobin: 9.5 g/dL — ABNORMAL LOW (ref 12.0–15.0)
MCH: 28.9 pg (ref 26.0–34.0)
MCHC: 30.1 g/dL (ref 30.0–36.0)
MCV: 96 fL (ref 80.0–100.0)
Platelets: 164 10*3/uL (ref 150–400)
RBC: 3.29 MIL/uL — ABNORMAL LOW (ref 3.87–5.11)
RDW: 17.2 % — ABNORMAL HIGH (ref 11.5–15.5)
WBC: 13.9 10*3/uL — ABNORMAL HIGH (ref 4.0–10.5)
nRBC: 0.4 % — ABNORMAL HIGH (ref 0.0–0.2)

## 2022-03-10 LAB — BASIC METABOLIC PANEL
Anion gap: 11 (ref 5–15)
BUN: 36 mg/dL — ABNORMAL HIGH (ref 6–20)
CO2: 22 mmol/L (ref 22–32)
Calcium: 7.9 mg/dL — ABNORMAL LOW (ref 8.9–10.3)
Chloride: 113 mmol/L — ABNORMAL HIGH (ref 98–111)
Creatinine, Ser: 1.07 mg/dL — ABNORMAL HIGH (ref 0.44–1.00)
GFR, Estimated: >60 mL/min (ref >60)
Glucose, Bld: 180 mg/dL — ABNORMAL HIGH (ref 70–99)
Potassium: 3.6 mmol/L (ref 3.5–5.1)
Sodium: 146 mmol/L — ABNORMAL HIGH (ref 135–145)

## 2022-03-10 MED ORDER — INSULIN GLARGINE-YFGN 100 UNIT/ML ~~LOC~~ SOLN
22.0000 [IU] | Freq: Two times a day (BID) | SUBCUTANEOUS | Status: DC
  Administered 2022-03-10 – 2022-03-13 (×6): 22 [IU] via SUBCUTANEOUS
  Filled 2022-03-10 (×7): qty 0.22

## 2022-03-10 MED ORDER — GERHARDT'S BUTT CREAM
TOPICAL_CREAM | Freq: Two times a day (BID) | CUTANEOUS | Status: DC
  Administered 2022-03-10 – 2022-03-13 (×2): 1 via TOPICAL
  Filled 2022-03-10 (×2): qty 1

## 2022-03-10 MED ORDER — POTASSIUM CHLORIDE 20 MEQ PO PACK
40.0000 meq | PACK | Freq: Once | ORAL | Status: AC
  Administered 2022-03-10: 40 meq
  Filled 2022-03-10: qty 2

## 2022-03-10 MED ORDER — MIDODRINE HCL 5 MG PO TABS
20.0000 mg | ORAL_TABLET | Freq: Three times a day (TID) | ORAL | Status: DC
Start: 2022-03-10 — End: 2022-03-10

## 2022-03-10 MED ORDER — ASPIRIN 81 MG PO CHEW
81.0000 mg | CHEWABLE_TABLET | Freq: Every day | ORAL | Status: DC
  Administered 2022-03-10 – 2022-03-18 (×9): 81 mg
  Filled 2022-03-10 (×9): qty 1

## 2022-03-10 MED ORDER — SENNA 8.6 MG PO TABS: 1.0000 | ORAL_TABLET | Freq: Every day | ORAL | Status: DC | PRN

## 2022-03-10 MED ORDER — MIDODRINE HCL 5 MG PO TABS
20.0000 mg | ORAL_TABLET | Freq: Three times a day (TID) | ORAL | Status: DC
  Administered 2022-03-10 – 2022-03-19 (×25): 20 mg
  Filled 2022-03-10 (×28): qty 4

## 2022-03-10 NOTE — Progress Notes (Signed)
Waste of 0 mg versed entered into Pyxis with Montez Hageman, RN as witness. 6 mg versed pulled yesterday for trach procedure on 5 mg order. During procedure Dr. Tacy Learn gave verbal order for 6 mg versed to be given, leaving undocumented waste in pyxis. Additional 1 mg verbal order placed. Pyxis waste reconciled with Montez Hageman this morning.

## 2022-03-10 NOTE — Evaluation (Signed)
SLP Cancellation Note  Patient Details Name: Gabrielle Vincent MRN: [id number] DOB: 1965-12-31   Cancelled treatment:       Reason Eval/Treat Not Completed: Patient not medically ready (pt remains on vent)  [name], MS [facility] SLP Acute Rehab Services Office [phone] Pager [phone]  Macario Golds 03/10/2022, 8:11 AM

## 2022-03-10 NOTE — Progress Notes (Signed)
NAME:  Gabrielle Vincent, MRN:  [id number], DOB:  03-07-1966, LOS: 24 ADMISSION DATE:  02/14/2022, CONSULTATION DATE:  5/5 REFERRING MD:  Rai, CHIEF COMPLAINT:  acute encephalopathy and sepsis    History of Present Illness:  56 year old female who was admitted on 5/3 with chief complaint of severe low back pain radiating down her left leg.  Also had associated hematuria and dysuria.  She had no weakness although low did say pain impacted her ability to walk.  Also noted poor p.o. intake, decreased urination as well as urinary retention, hematuria, dysuria, and some nausea.   CT scan was negative for obstructive uropathy did show fatty liver infiltrates, there is cholelithiasis but no acute cholecystitis there is mild circumferential thickening of the distal esophagus global enlargement of the uterus with thickened appearance of the endometrial stripe with radiology recommending nonemergent pelvic ultrasound.  Diagnostic evaluation notable for new AKI with serum creatinine 3.64, acute transaminitis leukocytosis, and hyponatremia of 127 she was admitted for further evaluation, cultures were sent, and she was started on IV ceftriaxone.  On 5/3 patient was noted to be more confused, speech was slurred , Working diagnosis was #1 urinary tract infection with resultant sepsis and also low back pain for which neurosurgery was consulted as MRI finding showed acute herniated L3-L4 disc with left lumbar radiculopathy.  It was felt that pain control via lumbar injection may help in that this could be treated conservatively.  On 5/4 patient becoming intermittently agitated then lethargic, because of this a CT of head was obtained this was severely limited due to motion degraded meant but there was concern about hypodense area in the right basal ganglia section raising concern for acute infarct.  On 5/5 a rapid response was called the patient was severely agitated, pulled out IVs, pull out Foley catheter  Later that  afternoon mental status continued to worsen.  Neurology consult was obtained in addition to this infectious disease was consulted with urine growing E. coli and narrowed antibiotics to cefazolin stopped vancomycin and recommended supportive care because her mental status continued to decline, she had severe metabolic derangements, and we were unable to provide medical care on the [place] ward she was transferred to the ICU for higher level of care.  On initial arrival she was agitated requiring several nurses to hold her down, tachypneic tachycardic would not follow commands had marked increased work of breathing.  An intraosseous line was placed in the right lower extremity, she was intubated for airway protection to facilitate further MRI imaging, and a right IJ triple-lumen catheter was placed  Pertinent  Medical History  Class II obesity, hepatic steatosis, seasonal allergies, varicose veins diverticulosis, HL   Significant Hospital Events: Including procedures, antibiotic start and stop dates in addition to other pertinent events   5/3 admitted with back pain and urinary tract infection , Further complicated by AKI hyponatremia and multiple metabolic derangements.   CT imaging for renal stones showed no acute abdominal pelvic findings there was no obstructive uropathy there was severe fatty liver disease there was cholelithiasis but no cholecystitis there is colonic diverticulosis but no diverticulitis, mild circumferential thickening of the esophagus globular enlargement of the uterus with radiology recommending nonemergent pelvic ultrasound found to have uterine fibroid abdominal Ultrasound showed fatty liver but was epididymides negative.   MRI of the lumbar spine showed small left subarticular disc extrusion with inferior migration at L3-L4 correlating with radiculopathy.  She was started on ceftriaxone cultures were sent 5/4 increased confusion CT  brain obtained raising concern for possible acute  infarct in the right basal ganglia 5/5 progressive encephalopathy , Worsening leukocytosis, hypernatremia, hyperchloremia, slowly improving renal function, slowly improving procalcitonin, moved to ICU due to delirium, intubated for airway protection and to facilitate MRI imaging right IJ central line placed due to limited IV access.  Seen by neurology, also seen by infectious disease with ceftriaxone changed to cefazolin 5/6 antibiotics changed to meningitis coverage given MRI findings of fluid in the ventricles.  Image guided LP ordered 5/8 LP done with purulent CSF, studies consistent with bacterial meningitis, culture growing gm + cocci 5/10 significant rise in sodium with massive urine output of greater than 7 L leading to concern for development of central DI.  DDAVP and volume resuscitation started 5/11 hypernatremia slowly downtrending, continue DDAVP and IV resuscitation per nephrology 5/12 Sodium downtrended to 147 this am 5/13 to OR for posterior cervical decompressive laminectomy and medial facetectomy C3-T1 for evacuation of epirudal abscess, posterior cervical arthrodesis C3-C7, segmental fixation C3-7 5/18 OR for thoacolumbar laminectomy and decompression of epidural abscess from T-L spine 5/19 extubated to [facility] 5/24 reintubated 5/25 chest tube. MRI with ongoing meningitis + new area of ischemia in central pons  Interim History / Subjective:  Patient had tracheostomy procedure yesterday, tolerated well Remained afebrile in last 24 hours but now started spiking fever again Tmax 100.9   Objective   Blood pressure (!) 112/53, pulse 88, temperature (!) 100.9 F (38.3 C), resp. rate (!) 23, height '5\' 4"'$  (1.626 m), weight 89.7 kg, SpO2 100 %.    Vent Mode: CPAP;PSV FiO2 (%):  [40 %] 40 % Set Rate:  [18 bmp] 18 bmp Vt Set:  [440 mL] 440 mL PEEP:  [5 cmH20] 5 cmH20 Pressure Support:  [10 cmH20] 10 cmH20 Plateau Pressure:  [15 cmH20-16 cmH20] 15 cmH20   Intake/Output Summary (Last 24  hours) at 03/10/2022 1118 Last data filed at 03/10/2022 0954 Gross per 24 hour  Intake 3964.05 ml  Output 1845 ml  Net 2119.05 ml   Filed Weights   03/07/22 0413 03/08/22 0500 03/10/22 0500  Weight: 91.2 kg 89.7 kg 89.7 kg   Physical exam: General: Critically ill middle aged F, s/p trach HEENT: NCAT pink mm, Cortrak in place Neuro: Awakens to voice.  Following commands, antigravity at elbow joint, no movement to painful stimuli in other extremities Chest: Symmetrical chest expansion, mechanically ventilated.  Right-sided chest tube in place Heart: rrr s1s2 no rgm  Abdomen: soft ndnt + bowel sounds  Skin:c/d/w  Resolved medical problems  Hypokalemia, resolved AKI due to septic ATN, resolved NAGMA - resolved  Anasarca with hypervolemia Shock liver superimposed on known fatty liver disease  Assessment & Plan:  Principal Problem:   Abscess in epidural space of cervical spine Active Problems:   Sepsis secondary to UTI (HCC)   Hyponatremia   Hypokalemia   AKI (acute kidney injury) ([place])   Abnormal LFTs   Class II obesity   Hepatic steatosis   Cholelithiasis   Esophageal thickening   Acute low back pain   Encephalopathy acute   Acute respiratory failure (HCC)   Radiculopathy of lumbar region   E. 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 Malignancy  Spontaneous R ptx  FiO2 was titrated down to 40% Patient is tolerating spontaneous breathing trial We will try trach collar as soon as possible VAP bundle PAD with low-dose fentanyl, with RASS goal 0 Chest tube is placed on waterseal Repeat x-ray chest in the morning or earlier if hemodynamic changes  Central DI Hypernatremia  Serum sodium has trended down to 146 Continue DDAVP and free water flushes Monitor serum sodium  AKI, due to severe dehydration from Central DI Serum creatinine continue to trend down Closely monitor intake and output Avoid nephrotoxic agents  We will get second to spinal cord infarct Acute central pontine stroke Continue secondary stroke prophylaxis Restarted back on aspirin  DM2 poorly controlled  Tube feeds were restarted Increase Lantus to 22 units, continue sliding scale insulin CBG goal 140-180    Best Practice (right click and "Reselect all SmartList Selections" daily)   Diet/type: TF DVT prophylaxis: prophylactic heparin  GI prophylaxis: PPI Lines: NA Foley:  Yes, and it is still needed- DI Code Status:  full code Last date of multidisciplinary goals of care discussion: 5/25 Spoke with patient brother and 2 sisters, decision was to proceed with tracheostomy and continue full scope of care   Total critical care time: 38 minutes  Performed by: Jacky Kindle   Critical care time was exclusive of separately billable procedures and treating other patients.   Critical care was necessary to treat or prevent imminent or life-threatening deterioration.   Critical care was time spent personally by me on the following activities: development  of treatment plan with patient and/or surrogate as well as nursing, discussions with consultants, evaluation of patient's response to treatment, examination of patient, obtaining history from patient or surrogate, ordering and performing treatments and interventions, ordering and review of laboratory studies, ordering and review of radiographic studies, pulse oximetry and re-evaluation of patient's condition.   Jacky Kindle MD [facility] See Amion for pager If no response to pager, please call [phone] until 7pm After 7pm, Please call E-link [phone]

## 2022-03-10 NOTE — Progress Notes (Signed)
[facility] Progress Note Patient Name: Gabrielle Vincent DOB: 04/17/1966 MRN: [id number]   Date of Service  03/10/2022  HPI/Events of Note  Patient with fever (Temp 99.3).  eICU Interventions  Bedside RN instructed to try to control fever with PRN Tylenol, and utilize Arctic sun only for rising temperature not controlled by PRN Tylenol.        Harmon Bommarito U Gabrielle Vincent 03/10/2022, 1:00 AM

## 2022-03-10 NOTE — Progress Notes (Signed)
[facility] ADULT ICU REPLACEMENT PROTOCOL   The patient does apply for the [facility] Adult ICU Electrolyte Replacment Protocol based on the criteria listed below:   1.Exclusion criteria: TCTS patients, ECMO patients, and Dialysis patients 2. Is GFR >/= 30 ml/min? Yes.    Patient's GFR today is >60 3. Is SCr </= 2? Yes.   Patient's SCr is 1.07 mg/dL 4. Did SCr increase >/= 0.5 in 24 hours? No. 5.Pt's weight >40kg  Yes.   6. Abnormal electrolyte(s):   K 3.6  7. Electrolytes replaced per protocol 8.  Call MD STAT for K+ </= 2.5, Phos </= 1, or Mag </= 1 Physician:  Lowella Bandy R Tahmir Kleckner 03/10/2022 5:58 AM

## 2022-03-11 ENCOUNTER — Inpatient Hospital Stay (HOSPITAL_COMMUNITY): Payer: BC Managed Care – PPO

## 2022-03-11 DIAGNOSIS — G009 Bacterial meningitis, unspecified: Secondary | ICD-10-CM | POA: Diagnosis not present

## 2022-03-11 DIAGNOSIS — N179 Acute kidney failure, unspecified: Secondary | ICD-10-CM | POA: Diagnosis not present

## 2022-03-11 DIAGNOSIS — G061 Intraspinal abscess and granuloma: Secondary | ICD-10-CM | POA: Diagnosis not present

## 2022-03-11 DIAGNOSIS — A419 Sepsis, unspecified organism: Secondary | ICD-10-CM | POA: Diagnosis not present

## 2022-03-11 LAB — BASIC METABOLIC PANEL
Anion gap: 6 (ref 5–15)
BUN: 21 mg/dL — ABNORMAL HIGH (ref 6–20)
CO2: 23 mmol/L (ref 22–32)
Calcium: 7.7 mg/dL — ABNORMAL LOW (ref 8.9–10.3)
Chloride: 109 mmol/L (ref 98–111)
Creatinine, Ser: 0.91 mg/dL (ref 0.44–1.00)
GFR, Estimated: >60 mL/min (ref >60)
Glucose, Bld: 145 mg/dL — ABNORMAL HIGH (ref 70–99)
Potassium: 3.4 mmol/L — ABNORMAL LOW (ref 3.5–5.1)
Sodium: 138 mmol/L (ref 135–145)

## 2022-03-11 LAB — CBC
HCT: 27.5 % — ABNORMAL LOW (ref 36.0–46.0)
Hemoglobin: 8.6 g/dL — ABNORMAL LOW (ref 12.0–15.0)
MCH: 29.7 pg (ref 26.0–34.0)
MCHC: 31.3 g/dL (ref 30.0–36.0)
MCV: 94.8 fL (ref 80.0–100.0)
Platelets: 183 10*3/uL (ref 150–400)
RBC: 2.9 MIL/uL — ABNORMAL LOW (ref 3.87–5.11)
RDW: 17.2 % — ABNORMAL HIGH (ref 11.5–15.5)
WBC: 16.8 10*3/uL — ABNORMAL HIGH (ref 4.0–10.5)
nRBC: 0.3 % — ABNORMAL HIGH (ref 0.0–0.2)

## 2022-03-11 LAB — GLUCOSE, CAPILLARY
Glucose-Capillary: 129 mg/dL — ABNORMAL HIGH (ref 70–99)
Glucose-Capillary: 131 mg/dL — ABNORMAL HIGH (ref 70–99)
Glucose-Capillary: 143 mg/dL — ABNORMAL HIGH (ref 70–99)
Glucose-Capillary: 148 mg/dL — ABNORMAL HIGH (ref 70–99)
Glucose-Capillary: 162 mg/dL — ABNORMAL HIGH (ref 70–99)
Glucose-Capillary: 167 mg/dL — ABNORMAL HIGH (ref 70–99)
Glucose-Capillary: 172 mg/dL — ABNORMAL HIGH (ref 70–99)

## 2022-03-11 LAB — CULTURE, RESPIRATORY W GRAM STAIN: Gram Stain: NONE SEEN

## 2022-03-11 IMAGING — DX DG CHEST 1V PORT
1 series · 1 of 1 positions shown · non-contrast
Comparison: [DATE]

CLINICAL DATA: Follow-up pneumothorax

EXAM:
PORTABLE CHEST 1 VIEW

[chest ap]
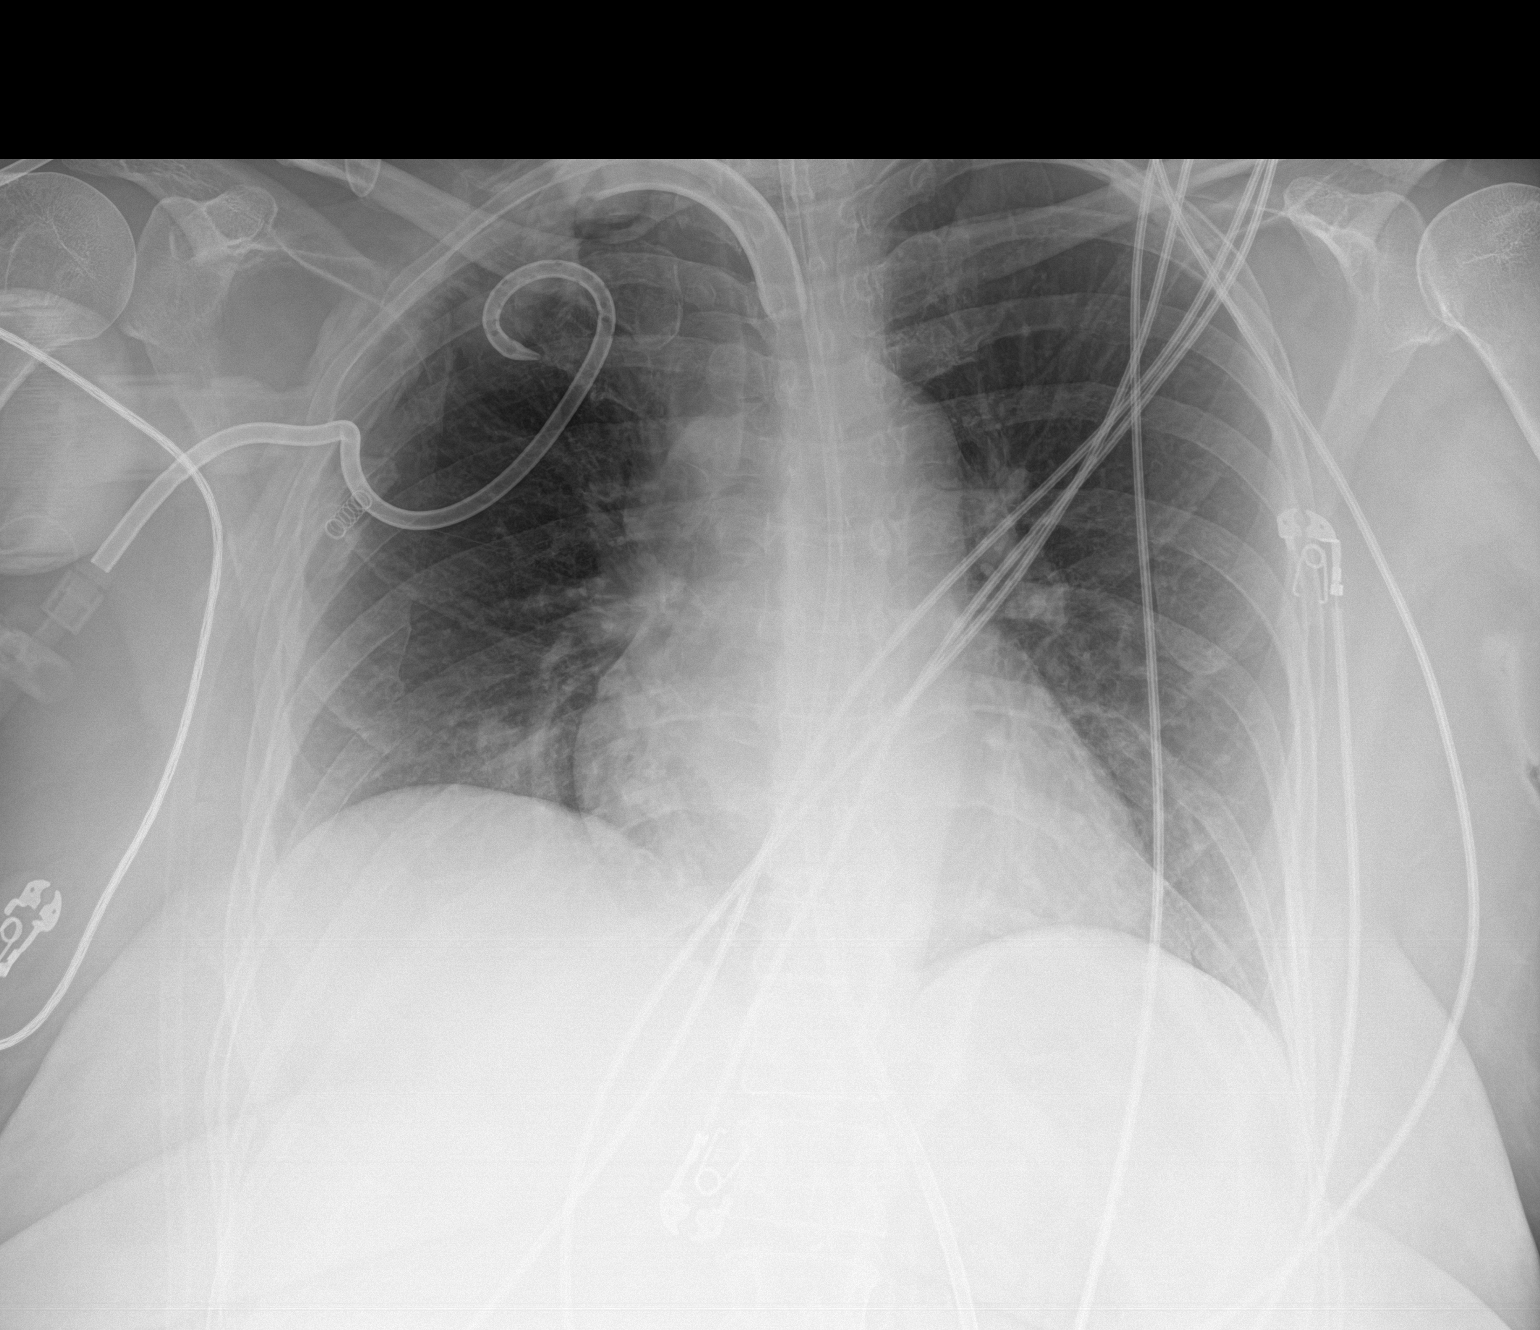

[1 of 1 positions shown; findings below may reference images not displayed]

FINDINGS: No significant change in AP portable chest radiograph. Right-sided
pigtail chest tube remains in position about the right apex without
appreciable residual pneumothorax. No acute airspace opacity.
Tracheostomy. Partially imaged enteric feeding tube. Heart and
mediastinum are unremarkable.
IMPRESSION: 1. No significant change in AP portable chest radiograph.
Right-sided pigtail chest tube remains in position about the right
apex without appreciable residual pneumothorax. No acute airspace
opacity.

2.  Tracheostomy.

## 2022-03-11 MED ORDER — POTASSIUM CHLORIDE 20 MEQ PO PACK
40.0000 meq | PACK | Freq: Once | ORAL | Status: AC
  Administered 2022-03-11: 40 meq
  Filled 2022-03-11: qty 2

## 2022-03-11 MED ORDER — FENTANYL CITRATE PF 50 MCG/ML IJ SOSY: 50.0000 ug | PREFILLED_SYRINGE | INTRAMUSCULAR | Status: DC | PRN

## 2022-03-11 MED ORDER — DESMOPRESSIN ACETATE 0.1 MG PO TABS
0.0500 mg | ORAL_TABLET | Freq: Three times a day (TID) | ORAL | Status: DC
  Administered 2022-03-11 – 2022-03-12 (×3): 0.05 mg
  Filled 2022-03-11 (×4): qty 1

## 2022-03-11 NOTE — Progress Notes (Signed)
[facility] ADULT ICU REPLACEMENT PROTOCOL   The patient does apply for the [facility] Adult ICU Electrolyte Replacment Protocol based on the criteria listed below:   1.Exclusion criteria: TCTS patients, ECMO patients, and Dialysis patients 2. Is GFR >/= 30 ml/min? Yes.    Patient's GFR today is >60 3. Is SCr </= 2? Yes.   Patient's SCr is 0.91 mg/dL 4. Did SCr increase >/= 0.5 in 24 hours? No. 5.Pt's weight >40kg  Yes.   6. Abnormal electrolyte(s): K  7. Electrolytes replaced per protocol 8.  Call MD STAT for K+ </= 2.5, Phos </= 1, or Mag </= 1 Physician:  Elisabeth Cara [facility] 03/11/2022 5:58 AM

## 2022-03-11 NOTE — Progress Notes (Signed)
NAME:  Ranesha Val, MRN:  [id number], DOB:  Feb 09, 1966, LOS: 51 ADMISSION DATE:  02/14/2022, CONSULTATION DATE:  5/5 REFERRING MD:  Rai, CHIEF COMPLAINT:  acute encephalopathy and sepsis    History of Present Illness:  56 year old female who was admitted on 5/3 with chief complaint of severe low back pain radiating down her left leg.  Also had associated hematuria and dysuria.  She had no weakness although low did say pain impacted her ability to walk.  Also noted poor p.o. intake, decreased urination as well as urinary retention, hematuria, dysuria, and some nausea.   CT scan was negative for obstructive uropathy did show fatty liver infiltrates, there is cholelithiasis but no acute cholecystitis there is mild circumferential thickening of the distal esophagus global enlargement of the uterus with thickened appearance of the endometrial stripe with radiology recommending nonemergent pelvic ultrasound.  Diagnostic evaluation notable for new AKI with serum creatinine 3.64, acute transaminitis leukocytosis, and hyponatremia of 127 she was admitted for further evaluation, cultures were sent, and she was started on IV ceftriaxone.  On 5/3 patient was noted to be more confused, speech was slurred , Working diagnosis was #1 urinary tract infection with resultant sepsis and also low back pain for which neurosurgery was consulted as MRI finding showed acute herniated L3-L4 disc with left lumbar radiculopathy.  It was felt that pain control via lumbar injection may help in that this could be treated conservatively.  On 5/4 patient becoming intermittently agitated then lethargic, because of this a CT of head was obtained this was severely limited due to motion degraded meant but there was concern about hypodense area in the right basal ganglia section raising concern for acute infarct.  On 5/5 a rapid response was called the patient was severely agitated, pulled out IVs, pull out Foley catheter  Later that  afternoon mental status continued to worsen.  Neurology consult was obtained in addition to this infectious disease was consulted with urine growing E. coli and narrowed antibiotics to cefazolin stopped vancomycin and recommended supportive care because her mental status continued to decline, she had severe metabolic derangements, and we were unable to provide medical care on the [place] ward she was transferred to the ICU for higher level of care.  On initial arrival she was agitated requiring several nurses to hold her down, tachypneic tachycardic would not follow commands had marked increased work of breathing.  An intraosseous line was placed in the right lower extremity, she was intubated for airway protection to facilitate further MRI imaging, and a right IJ triple-lumen catheter was placed  Pertinent  Medical History  Class II obesity, hepatic steatosis, seasonal allergies, varicose veins diverticulosis, HL   Significant Hospital Events: Including procedures, antibiotic start and stop dates in addition to other pertinent events   5/3 admitted with back pain and urinary tract infection , Further complicated by AKI hyponatremia and multiple metabolic derangements.   CT imaging for renal stones showed no acute abdominal pelvic findings there was no obstructive uropathy there was severe fatty liver disease there was cholelithiasis but no cholecystitis there is colonic diverticulosis but no diverticulitis, mild circumferential thickening of the esophagus globular enlargement of the uterus with radiology recommending nonemergent pelvic ultrasound found to have uterine fibroid abdominal Ultrasound showed fatty liver but was epididymides negative.   MRI of the lumbar spine showed small left subarticular disc extrusion with inferior migration at L3-L4 correlating with radiculopathy.  She was started on ceftriaxone cultures were sent 5/4 increased confusion CT  brain obtained raising concern for possible acute  infarct in the right basal ganglia 5/5 progressive encephalopathy , Worsening leukocytosis, hypernatremia, hyperchloremia, slowly improving renal function, slowly improving procalcitonin, moved to ICU due to delirium, intubated for airway protection and to facilitate MRI imaging right IJ central line placed due to limited IV access.  Seen by neurology, also seen by infectious disease with ceftriaxone changed to cefazolin 5/6 antibiotics changed to meningitis coverage given MRI findings of fluid in the ventricles.  Image guided LP ordered 5/8 LP done with purulent CSF, studies consistent with bacterial meningitis, culture growing gm + cocci 5/10 significant rise in sodium with massive urine output of greater than 7 L leading to concern for development of central DI.  DDAVP and volume resuscitation started 5/11 hypernatremia slowly downtrending, continue DDAVP and IV resuscitation per nephrology 5/12 Sodium downtrended to 147 this am 5/13 to OR for posterior cervical decompressive laminectomy and medial facetectomy C3-T1 for evacuation of epirudal abscess, posterior cervical arthrodesis C3-C7, segmental fixation C3-7 5/18 OR for thoacolumbar laminectomy and decompression of epidural abscess from T-L spine 5/19 extubated to [facility] 5/24 reintubated 5/25 chest tube. MRI with ongoing meningitis + new area of ischemia in central pons  Interim History / Subjective:  Remained afebrile Came off of vasopressors overnight Tolerating spontaneous breathing trial this morning   Objective   Blood pressure (!) 103/48, pulse 93, temperature 99.5 F (37.5 C), resp. rate (!) 22, height '5\' 4"'$  (1.626 m), weight 89.7 kg, SpO2 100 %.    Vent Mode: CPAP;PSV FiO2 (%):  [40 %] 40 % Set Rate:  [18 bmp] 18 bmp Vt Set:  [440 mL] 440 mL PEEP:  [5 cmH20] 5 cmH20 Pressure Support:  [8 cmH20] 8 cmH20 Plateau Pressure:  [12 cmH20] 12 cmH20   Intake/Output Summary (Last 24 hours) at 03/11/2022 0949 Last data filed at  03/11/2022 0920 Gross per 24 hour  Intake 5575.9 ml  Output 1595 ml  Net 3980.9 ml   Filed Weights   03/07/22 0413 03/08/22 0500 03/10/22 0500  Weight: 91.2 kg 89.7 kg 89.7 kg   Physical exam: General: Critically ill middle aged F, s/p trach HEENT: NCAT pink mm, Cortrak in place Neuro: Awake, following commands, antigravity at elbow joint, no movement to painful stimuli in other extremities Chest: Symmetrical chest expansion, mechanically ventilated.  Right-sided chest tube in place Heart: rrr s1s2 no rgm  Abdomen: soft ndnt + bowel sounds  Skin:c/d/w  Resolved medical problems  AKI due to septic ATN, resolved NAGMA - resolved  Anasarca with hypervolemia Shock liver superimposed on known fatty liver disease  Assessment & Plan:  Principal Problem:   Abscess in epidural space of cervical spine Active Problems:   Sepsis secondary to UTI (HCC)   Hyponatremia   Hypokalemia   AKI (acute kidney injury) (HCC)   Abnormal LFTs   Class II obesity   Hepatic steatosis   Cholelithiasis   Esophageal thickening   Acute low back pain   Encephalopathy acute   Acute respiratory failure (HCC)   Radiculopathy of lumbar region   E. 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 Malignancy  Spontaneous R ptx  FiO2 was titrated down to 40% Patient is tolerating spontaneous breathing trial We will try trach collar later today VAP bundle PAD with as needed fentanyl, with RASS goal 0 Chest tube is placed on waterseal We will repeat x-ray chest if there is no recurrence of pneumothorax, will discontinue chest tube  Central DI Hypernatremia, improved Serum sodium has trended down to 138 Decrease DDAVP to 0.05 mics every 8h and continue free water flushes Monitor serum sodium  AKI, due to severe dehydration from Central DI Serum creatinine improved now back to baseline Closely monitor intake and output Avoid nephrotoxic agents  We will get second to spinal cord infarct Acute central pontine stroke Continue secondary stroke prophylaxis Continue aspirin  DM2 poorly controlled  Blood sugars are better controlled Continue Lantus to 22 units, continue sliding scale insulin CBG goal 140-180  Hypokalemia Continue aggressive electrolyte supplement and monitor  Anemia of critical illness Monitor H&H and transfuse if less than 7  Best Practice (right click and "Reselect all SmartList Selections" daily)   Diet/type: TF DVT prophylaxis: prophylactic heparin  GI prophylaxis: PPI Lines: NA Foley:  Yes, and it is still needed- DI Code Status:  full code Last date of multidisciplinary goals of care discussion: 5/25 Spoke with patient brother and 2 sisters, decision was to proceed with tracheostomy and continue full scope of care   Total critical care time: 35 minutes  Performed by: Jacky Kindle   Critical care time was exclusive of separately billable procedures and treating other patients.   Critical care was necessary to treat or prevent imminent or life-threatening deterioration.    Critical care was time spent personally by me on the following activities: development of treatment plan with patient and/or surrogate as well as nursing, discussions with consultants, evaluation of patient's response to treatment, examination of patient, obtaining history from patient or surrogate, ordering and performing treatments and interventions, ordering and review of laboratory studies, ordering and review of radiographic studies, pulse oximetry and re-evaluation of patient's condition.   Jacky Kindle MD [facility] See Amion for pager If no response to pager, please call [phone] until 7pm After 7pm, Please call E-link [phone]

## 2022-03-11 NOTE — Progress Notes (Signed)
RT placed patient on 10L 40% ATC per MD. Patient tolerating well at this time. RN aware. RT will continue to monitor.

## 2022-03-12 DIAGNOSIS — J9601 Acute respiratory failure with hypoxia: Secondary | ICD-10-CM | POA: Diagnosis not present

## 2022-03-12 DIAGNOSIS — G061 Intraspinal abscess and granuloma: Secondary | ICD-10-CM | POA: Diagnosis not present

## 2022-03-12 DIAGNOSIS — N179 Acute kidney failure, unspecified: Secondary | ICD-10-CM | POA: Diagnosis not present

## 2022-03-12 DIAGNOSIS — A419 Sepsis, unspecified organism: Secondary | ICD-10-CM | POA: Diagnosis not present

## 2022-03-12 LAB — CBC
HCT: 27.1 % — ABNORMAL LOW (ref 36.0–46.0)
Hemoglobin: 8.5 g/dL — ABNORMAL LOW (ref 12.0–15.0)
MCH: 29.2 pg (ref 26.0–34.0)
MCHC: 31.4 g/dL (ref 30.0–36.0)
MCV: 93.1 fL (ref 80.0–100.0)
Platelets: 194 10*3/uL (ref 150–400)
RBC: 2.91 MIL/uL — ABNORMAL LOW (ref 3.87–5.11)
RDW: 17.3 % — ABNORMAL HIGH (ref 11.5–15.5)
WBC: 16.7 10*3/uL — ABNORMAL HIGH (ref 4.0–10.5)
nRBC: 0.2 % (ref 0.0–0.2)

## 2022-03-12 LAB — BASIC METABOLIC PANEL
Anion gap: 8 (ref 5–15)
BUN: 16 mg/dL (ref 6–20)
CO2: 21 mmol/L — ABNORMAL LOW (ref 22–32)
Calcium: 7.9 mg/dL — ABNORMAL LOW (ref 8.9–10.3)
Chloride: 104 mmol/L (ref 98–111)
Creatinine, Ser: 0.77 mg/dL (ref 0.44–1.00)
GFR, Estimated: >60 mL/min (ref >60)
Glucose, Bld: 166 mg/dL — ABNORMAL HIGH (ref 70–99)
Potassium: 3.7 mmol/L (ref 3.5–5.1)
Sodium: 133 mmol/L — ABNORMAL LOW (ref 135–145)

## 2022-03-12 LAB — GLUCOSE, CAPILLARY
Glucose-Capillary: 174 mg/dL — ABNORMAL HIGH (ref 70–99)
Glucose-Capillary: 127 mg/dL — ABNORMAL HIGH (ref 70–99)
Glucose-Capillary: 186 mg/dL — ABNORMAL HIGH (ref 70–99)
Glucose-Capillary: 169 mg/dL — ABNORMAL HIGH (ref 70–99)
Glucose-Capillary: 122 mg/dL — ABNORMAL HIGH (ref 70–99)
Glucose-Capillary: 194 mg/dL — ABNORMAL HIGH (ref 70–99)

## 2022-03-12 LAB — CULTURE, RESPIRATORY W GRAM STAIN

## 2022-03-12 MED ORDER — FREE WATER
200.0000 mL | Status: DC
  Administered 2022-03-12 – 2022-03-13 (×6): 200 mL

## 2022-03-12 MED ORDER — DESMOPRESSIN ACETATE 0.1 MG PO TABS
0.0500 mg | ORAL_TABLET | Freq: Two times a day (BID) | ORAL | Status: DC
  Administered 2022-03-12 – 2022-03-13 (×2): 0.05 mg
  Filled 2022-03-12 (×2): qty 1

## 2022-03-12 MED ORDER — IPRATROPIUM-ALBUTEROL 0.5-2.5 (3) MG/3ML IN SOLN: 3.0000 mL | Freq: Four times a day (QID) | RESPIRATORY_TRACT | Status: DC | PRN

## 2022-03-12 MED ORDER — POTASSIUM CHLORIDE 20 MEQ PO PACK
40.0000 meq | PACK | Freq: Once | ORAL | Status: AC
  Administered 2022-03-12: 40 meq
  Filled 2022-03-12: qty 2

## 2022-03-12 NOTE — Progress Notes (Signed)
NAME:  Gabrielle Vincent, MRN:  [id number], DOB:  1966/07/27, LOS: 8 ADMISSION DATE:  02/14/2022, CONSULTATION DATE:  5/5 REFERRING MD:  Rai, CHIEF COMPLAINT:  acute encephalopathy and sepsis    History of Present Illness:  56 year old female who was admitted on 5/3 with chief complaint of severe low back pain radiating down her left leg.  Also had associated hematuria and dysuria.  She had no weakness although low did say pain impacted her ability to walk.  Also noted poor p.o. intake, decreased urination as well as urinary retention, hematuria, dysuria, and some nausea.   CT scan was negative for obstructive uropathy did show fatty liver infiltrates, there is cholelithiasis but no acute cholecystitis there is mild circumferential thickening of the distal esophagus global enlargement of the uterus with thickened appearance of the endometrial stripe with radiology recommending nonemergent pelvic ultrasound.  Diagnostic evaluation notable for new AKI with serum creatinine 3.64, acute transaminitis leukocytosis, and hyponatremia of 127 she was admitted for further evaluation, cultures were sent, and she was started on IV ceftriaxone.  On 5/3 patient was noted to be more confused, speech was slurred , Working diagnosis was #1 urinary tract infection with resultant sepsis and also low back pain for which neurosurgery was consulted as MRI finding showed acute herniated L3-L4 disc with left lumbar radiculopathy.  It was felt that pain control via lumbar injection may help in that this could be treated conservatively.  On 5/4 patient becoming intermittently agitated then lethargic, because of this a CT of head was obtained this was severely limited due to motion degraded meant but there was concern about hypodense area in the right basal ganglia section raising concern for acute infarct.  On 5/5 a rapid response was called the patient was severely agitated, pulled out IVs, pull out Foley catheter  Later that  afternoon mental status continued to worsen.  Neurology consult was obtained in addition to this infectious disease was consulted with urine growing E. coli and narrowed antibiotics to cefazolin stopped vancomycin and recommended supportive care because her mental status continued to decline, she had severe metabolic derangements, and we were unable to provide medical care on the [place] ward she was transferred to the ICU for higher level of care.  On initial arrival she was agitated requiring several nurses to hold her down, tachypneic tachycardic would not follow commands had marked increased work of breathing.  An intraosseous line was placed in the right lower extremity, she was intubated for airway protection to facilitate further MRI imaging, and a right IJ triple-lumen catheter was placed  Pertinent  Medical History  Class II obesity, hepatic steatosis, seasonal allergies, varicose veins diverticulosis, HL   Significant Hospital Events: Including procedures, antibiotic start and stop dates in addition to other pertinent events   5/3 admitted with back pain and urinary tract infection , Further complicated by AKI hyponatremia and multiple metabolic derangements.   CT imaging for renal stones showed no acute abdominal pelvic findings there was no obstructive uropathy there was severe fatty liver disease there was cholelithiasis but no cholecystitis there is colonic diverticulosis but no diverticulitis, mild circumferential thickening of the esophagus globular enlargement of the uterus with radiology recommending nonemergent pelvic ultrasound found to have uterine fibroid abdominal Ultrasound showed fatty liver but was epididymides negative.   MRI of the lumbar spine showed small left subarticular disc extrusion with inferior migration at L3-L4 correlating with radiculopathy.  She was started on ceftriaxone cultures were sent 5/4 increased confusion CT  brain obtained raising concern for possible acute  infarct in the right basal ganglia 5/5 progressive encephalopathy , Worsening leukocytosis, hypernatremia, hyperchloremia, slowly improving renal function, slowly improving procalcitonin, moved to ICU due to delirium, intubated for airway protection and to facilitate MRI imaging right IJ central line placed due to limited IV access.  Seen by neurology, also seen by infectious disease with ceftriaxone changed to cefazolin 5/6 antibiotics changed to meningitis coverage given MRI findings of fluid in the ventricles.  Image guided LP ordered 5/8 LP done with purulent CSF, studies consistent with bacterial meningitis, culture growing gm + cocci 5/10 significant rise in sodium with massive urine output of greater than 7 L leading to concern for development of central DI.  DDAVP and volume resuscitation started 5/11 hypernatremia slowly downtrending, continue DDAVP and IV resuscitation per nephrology 5/12 Sodium downtrended to 147 this am 5/13 to OR for posterior cervical decompressive laminectomy and medial facetectomy C3-T1 for evacuation of epirudal abscess, posterior cervical arthrodesis C3-C7, segmental fixation C3-7 5/18 OR for thoacolumbar laminectomy and decompression of epidural abscess from T-L spine 5/19 extubated to [facility] 5/24 reintubated 5/25 chest tube. MRI with ongoing meningitis + new area of ischemia in central pons  Interim History / Subjective:  Remained afebrile and off vasopressors Tolerated spontaneous breathing trial yesterday, was placed on trach collar has been on it for 12 hours   Objective   Blood pressure (!) 106/92, pulse 85, temperature 99.5 F (37.5 C), resp. rate (!) 23, height '5\' 4"'$  (1.626 m), weight 95.1 kg, SpO2 100 %.    Vent Mode: CPAP;PSV FiO2 (%):  [35 %-40 %] 35 % PEEP:  [5 cmH20] 5 cmH20 Pressure Support:  [8 cmH20] 8 cmH20   Intake/Output Summary (Last 24 hours) at 03/12/2022 0941 Last data filed at 03/12/2022 7846 Gross per 24 hour  Intake 5145.61 ml   Output 3674 ml  Net 1471.61 ml   Filed Weights   03/08/22 0500 03/10/22 0500 03/12/22 0500  Weight: 89.7 kg 89.7 kg 95.1 kg   Physical exam: General: Critically ill middle aged F, s/p trach HEENT: NCAT pink mm, Cortrak in place Neuro: Awake, following commands, antigravity at elbow joint, no movement to painful stimuli in other extremities Chest: Symmetrical chest expansion, mechanically ventilated.  Right-sided chest tube in place Heart: rrr s1s2 no rgm  Abdomen: soft ndnt + bowel sounds  Skin:c/d/w  Resolved medical problems  AKI due to septic ATN, resolved NAGMA - resolved  Anasarca with hypervolemia Shock liver superimposed on known fatty liver disease Septic shock  Assessment & Plan:  Principal Problem:   Abscess in epidural space of cervical spine Active Problems:   Sepsis secondary to UTI (HCC)   Hyponatremia   Hypokalemia   AKI (acute kidney injury) (HCC)   Abnormal LFTs   Class II obesity   Hepatic steatosis   Cholelithiasis   Esophageal thickening   Acute low back pain   Encephalopathy acute   Acute respiratory failure (HCC)   Radiculopathy of lumbar region   E. 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 Malignancy  Spontaneous R ptx  Patient is tolerating trach collar for 12 hours Continue trach collar as long as tolerates VAP bundle PAD with as needed fentanyl, with RASS goal 0 Chest tube is under waterseal Repeat x-ray did not show recurrence of pneumothorax We will discontinue chest tube today  Central DI Hypernatremia, improved Serum sodium has trended down to 133 now Decrease DDAVP to 0.05 mics every 12h and decrease free water flushes to 200 every 4 hours Monitor serum sodium  AKI, due to severe dehydration from Central DI, resolved Closely monitor intake and output Avoid nephrotoxic agents  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 Continue secondary stroke prophylaxis Continue aspirin  DM2 poorly controlled  Blood sugars are better controlled Continue Lantus to 22 units, continue sliding scale insulin CBG goal 140-180  Hypokalemia, improved Continue aggressive electrolyte supplement and monitor  Anemia of critical illness Monitor H&H and transfuse if less than 7  Best Practice (right click and "Reselect all SmartList Selections" daily)   Diet/type: TF DVT prophylaxis: prophylactic heparin  GI prophylaxis: PPI Lines: NA Foley:  Yes, and it is still needed- DI Code Status:  full code Last date of multidisciplinary goals of care discussion: 5/25 Spoke with patient brother and 2 sisters, decision was to proceed with tracheostomy and continue full scope of care   Total critical care time: 32 minutes  Performed by: Jacky Kindle   Critical care time was exclusive of separately billable procedures and treating other patients.   Critical care was necessary to treat or prevent imminent or life-threatening deterioration.   Critical care was time spent personally by me on the following activities: development of  treatment plan with patient and/or surrogate as well as nursing, discussions with consultants, evaluation of patient's response to treatment, examination of patient, obtaining history from patient or surrogate, ordering and performing treatments and interventions, ordering and review of laboratory studies, ordering and review of radiographic studies, pulse oximetry and re-evaluation of patient's condition.   Jacky Kindle MD [facility] See Amion for pager If no response to pager, please call [phone] until 7pm After 7pm, Please call E-link [phone]

## 2022-03-12 NOTE — Progress Notes (Signed)
Physical Therapy Treatment Patient Details Name: Gabrielle Vincent MRN: [id number] DOB: March 28, 1966 Today's Date: 03/12/2022   History of Present Illness 56 y.o. female presents to [facility] on 02/14/2022 with complaints of severe back pain associated with hematuria and dysuria. Pt admitted for management of UTI and AKI. MRI demonstrates small herniated nucleus pulposus at L3-L4 on the left side. 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 Pt intubated 02/16/2022. Cultures positive for meningitis. MRI 02/23/2022 demonstrates diffuse epidural abscess with cord compression and likely signal change. Pt underwent C3-T1 decompressive laminectomy with epidural abscess evaucation along with C3-7 fusion on 5/13. MRI 5/16 demonstrates Extensive thoracic epidural abscess. 5/18- Pt underwent Thoracolumbar laminectomy and decompression of epidural abscess. Extubated 5/19. 5/24 reintubated, 5/25 chest tube placement and MRI with new area of ischemia in central pons. Trachostomy on 5/26. PMH includes obesity, HLD, diverticulosis.    PT Comments    Pt tolerates treatment well, with improved initiation of rolling and bed mobility. PT notes activation of BLE flexor and extensor musculature intermittently during session although none against gravity. Pt with improved tolerance for bed mobility, rolling multiple times and progressing to a brief period of sitting upright without back support. Pt will benefit from continued aggressive mobilization in an effort to reduce caregiver burden and improve strength.   Recommendations for follow up therapy are one component of a multi-disciplinary discharge planning process, led by the attending physician.  Recommendations may be updated based on patient status, additional functional criteria and insurance authorization.  Follow Up Recommendations  Skilled nursing-short term rehab (<3 hours/day)     Assistance Recommended at Discharge Frequent or constant  Supervision/Assistance  Patient can return home with the following Two people to help with walking and/or transfers;Two people to help with bathing/dressing/bathroom;Assistance with cooking/housework;Assistance with feeding;Assist for transportation;Help with stairs or ramp for entrance;Direct supervision/assist for medications management;Direct supervision/assist for financial management   Equipment Recommendations  Wheelchair (measurements PT);Wheelchair cushion (measurements PT);Hospital bed;Other (comment) (hoyer lift)    Recommendations for Other Services       Precautions / Restrictions Precautions Precautions: Fall;Back Precaution Booklet Issued: No Precaution Comments: Reviewed precautions Required Braces or Orthoses:  (no brace needed per orders) Restrictions Weight Bearing Restrictions: No     Mobility  Bed Mobility Overal bed mobility: Needs Assistance Bed Mobility: Rolling, Sidelying to Sit, Sit to Sidelying Rolling: Total assist Sidelying to sit: Total assist     Sit to sidelying: Total assist General bed mobility comments: pt with improved initiation of rolling, following cues to bring UE across body. PT notes some R hamstring activation in efforts to bed knee pre-roll. Pt continues to require >75% assistance for all bed mobility, rolling to L 3 times and R once    Transfers                        Ambulation/Gait                   Stairs             Wheelchair Mobility    Modified Rankin (Stroke Patients Only)       Balance Overall balance assessment: Needs assistance Sitting-balance support: Single extremity supported, Bilateral upper extremity supported, Feet unsupported Sitting balance-Leahy Scale: Zero Sitting balance - Comments: totalA Postural control: Left lateral lean  Cognition Arousal/Alertness: Awake/alert Behavior During Therapy: Flat affect Overall Cognitive  Status: Difficult to assess                                 General Comments: pt follows commands well during session, needing increased time.        Exercises      General Comments General comments (skin integrity, edema, etc.): VSS, pt on 5L 28% FiO2 trach collar. pt coughing throughout session but otherwise tolerates well      Pertinent Vitals/Pain Pain Assessment Pain Assessment: Faces Faces Pain Scale: Hurts even more Pain Location: back Pain Descriptors / Indicators: Grimacing Pain Intervention(s): Monitored during session    Home Living                          Prior Function            PT Goals (current goals can now be found in the care plan section) Acute Rehab PT Goals Patient Stated Goal: to gain strength in extremities and move better Progress towards PT goals: Progressing toward goals    Frequency    Min 3X/week      PT Plan Current plan remains appropriate    Co-evaluation              AM-PAC PT "6 Clicks" Mobility   Outcome Measure  Help needed turning from your back to your side while in a flat bed without using bedrails?: Total Help needed moving from lying on your back to sitting on the side of a flat bed without using bedrails?: Total Help needed moving to and from a bed to a chair (including a wheelchair)?: Total Help needed standing up from a chair using your arms (e.g., wheelchair or bedside chair)?: Total Help needed to walk in hospital room?: Total Help needed climbing 3-5 steps with a railing? : Total 6 Click Score: 6    End of Session Equipment Utilized During Treatment: Oxygen Activity Tolerance: Patient tolerated treatment well Patient left: in bed;with call bell/phone within reach;with bed alarm set;with nursing/sitter in room Nurse Communication: Mobility status;Need for lift equipment PT Visit Diagnosis: Other abnormalities of gait and mobility (R26.89);Other symptoms and signs involving the  nervous system (R29.898);Pain     Time: 8016-5537 PT Time Calculation (min) (ACUTE ONLY): 32 min  Charges:  $Therapeutic Activity: 23-37 mins                    [name], PT, DPT Acute Rehabilitation Pager: [phone] Office [place] 03/12/2022, 9:23 AM

## 2022-03-12 NOTE — Progress Notes (Signed)
[facility] ADULT ICU REPLACEMENT PROTOCOL   The patient does apply for the [facility] Adult ICU Electrolyte Replacment Protocol based on the criteria listed below:   1.Exclusion criteria: TCTS patients, ECMO patients, and Dialysis patients 2. Is GFR >/= 30 ml/min? Yes.    Patient's GFR today is >60 3. Is SCr </= 2? Yes.   Patient's SCr is 0.77 mg/dL 4. Did SCr increase >/= 0.5 in 24 hours? No. 5.Pt's weight >40kg  Yes.   6. Abnormal electrolyte(s): K  7. Electrolytes replaced per protocol 8.  Call MD STAT for K+ </= 2.5, Phos </= 1, or Mag </= 1 Physician:  Sherlene Shams [facility] 03/12/2022 4:52 AM

## 2022-03-12 NOTE — Progress Notes (Signed)
Joelene Millin, RN removed chest tube. Vaseline gauze dressing applied to site. No respiratory distress noted. O2 sats 97% on trach collar.

## 2022-03-12 NOTE — Evaluation (Signed)
[facility] Speaking Valve - Evaluation Patient Details  Name: Gabrielle Vincent MRN: [id number] Date of Birth: Nov 02, 1965  Today's Date: 03/12/2022 Time: 6314-9702 SLP Time Calculation (min) (ACUTE ONLY): 10 min  Past Medical History:  Past Medical History:  Diagnosis Date   Aortic atherosclerosis ([place]) 02/14/2022   Class II obesity 02/14/2022   Diverticulosis 02/14/2022   Hepatic steatosis 02/14/2022   Hyperlipidemia 02/07/2016   Seasonal allergies    Swelling of lower extremity    bilateral   Varicose veins    right leg   Past Surgical History:  Past Surgical History:  Procedure Laterality Date   LUMBAR LAMINECTOMY/DECOMPRESSION MICRODISCECTOMY N/A 03/01/2022   Procedure: Thoracic eleven - twelve Laminectomy with drainage of epidural abscess;  Surgeon: Kristeen Miss, MD;  Location: [place];  Service: Neurosurgery;  Laterality: N/A;   lymphnode drainage surgery     POSTERIOR CERVICAL FUSION/FORAMINOTOMY N/A 02/24/2022   Procedure: POSTERIOR CERVICAL LAMINECTOMY CERVICAL THREE-CERVICAL SEVEN WITH LATERAL MASS FUSION / FIXATION;  Surgeon: Eustace Moore, MD;  Location: [place];  Service: Neurosurgery;  Laterality: N/A;   HPI:  56 y.o. female presents 02/14/2022 with severe back pain associated with hematuria and dysuria. Found to have UTI and  AKI. MRI demonstrated small herniated nucleus pulposus at L3-L4. 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 Cultures positive for meningitis. MRI spine demonstrates diffuse epidural abscess with cord compression and likely signal change. Pt underwent C3-T1 decompressive laminectomy with epidural abscess evaucation along with C3-7 fusion on 5/13. MRI 5/16 extensive thoracic epidural abscess. 5/18- Pt underwent thoracolumbar laminectomy and decompression of epidural abscess. Extubated 5/19. Swallow evaluated 5/21 with recs for NPO; high aspiration risk. Reintubated 5/25; 5/25  repeat MRI with ongoing meningitis + new area of ischemia in central pons. Trach 5/26.  PMH includes obesity, HLD, diverticulosis.    Assessment / Plan / Recommendation  Clinical Impression  Pt currently on ATC and ready for PMV eval.  Cuff partially inflated at baseline; removed 4 ml air from balloon line.  [name] followed commands intermittently.  Baseline VS were noted and did not fluctuate during brief PMV trial period. Valve placed for intervals of 2-4 breath cycles; placement led to immediate and consistent coughing. Provided with rest breaks, repositioning between placements, but presence of valve continued to elicit coughing.  Cuff re-inflated to baseline pressure.   Pt unable to achieve phonation despite effort - aphonic quality of voice was present prior to re-intubation and trach.  Recommend continued trials of PMV with SLP only.  When she is ready for swallow study, will benefit from FEES so that larynx can be visualized. SLP will follow.  SLP Visit Diagnosis: Aphonia (R49.1)    SLP Assessment  Patient needs continued Speech [facility]    Recommendations for follow up therapy are one component of a multi-disciplinary discharge planning process, led by the attending physician.  Recommendations may be updated based on patient status, additional functional criteria and insurance authorization.  Follow Up Recommendations  Acute inpatient rehab (3hours/day)    Assistance Recommended at Discharge    Functional Status Assessment Patient has had a recent decline in their functional status and demonstrates the ability to make significant improvements in function in a reasonable and predictable amount of time.  Frequency and Duration min 2x/week  4 weeks    PMSV Trial PMSV was placed for: intervals of 2-4 breath cycles Able to redirect subglottic air through upper airway:  (  intermittently) Able to Attain Phonation: No Voice Quality: Aphonic Able to Expectorate  Secretions: Yes Level of Secretion Expectoration with PMSV: Tracheal Breath Support for Phonation: Inadequate Intelligibility: Not tested Respirations During Trial: 22 SpO2 During Trial: 100 % Pulse During Trial: 94 Behavior: Alert   Tracheostomy Tube  Additional Tracheostomy Tube Assessment Fenestrated: No    Vent Dependency  Vent Dependent: No FiO2 (%): 35 %    Cuff Deflation Trial Tolerated Cuff Deflation: Yes  Gabrielle Vincent, [place] Office number [phone] Pager [phone]        Gabrielle Vincent 03/12/2022, 1:45 PM

## 2022-03-12 NOTE — Progress Notes (Signed)
Inpatient Rehab Admissions Coordinator:   Per therapy recommendations, patient was screened for CIR candidacy by Clemens Catholic, MS, CCC-SLP . At this time, Pt. is total A for bed mobility and not tolerating transfers. I do not believe she is at a level to tolerate CIR at this time; however,  Pt. may have potential to progress to becoming a potential CIR candidate, so CIR admissions team will follow and monitor for progress and participation with therapies and place consult order if Pt. appears to be an appropriate candidate. Please contact me with any questions.    Clemens Catholic, [place], [place] Admissions Coordinator  [phone] ([place]) [phone] (office)

## 2022-03-13 ENCOUNTER — Telehealth: Payer: Self-pay | Admitting: Pulmonary Disease

## 2022-03-13 ENCOUNTER — Encounter (HOSPITAL_COMMUNITY): Payer: Self-pay | Admitting: Internal Medicine

## 2022-03-13 DIAGNOSIS — G061 Intraspinal abscess and granuloma: Secondary | ICD-10-CM | POA: Diagnosis not present

## 2022-03-13 DIAGNOSIS — J9601 Acute respiratory failure with hypoxia: Secondary | ICD-10-CM | POA: Diagnosis not present

## 2022-03-13 DIAGNOSIS — N390 Urinary tract infection, site not specified: Secondary | ICD-10-CM | POA: Diagnosis not present

## 2022-03-13 DIAGNOSIS — L89309 Pressure ulcer of unspecified buttock, unspecified stage: Secondary | ICD-10-CM | POA: Diagnosis not present

## 2022-03-13 DIAGNOSIS — B9620 Unspecified Escherichia coli [E. coli] as the cause of diseases classified elsewhere: Secondary | ICD-10-CM | POA: Diagnosis not present

## 2022-03-13 DIAGNOSIS — L8990 Pressure ulcer of unspecified site, unspecified stage: Secondary | ICD-10-CM | POA: Insufficient documentation

## 2022-03-13 LAB — GLUCOSE, CAPILLARY
Glucose-Capillary: 182 mg/dL — ABNORMAL HIGH (ref 70–99)
Glucose-Capillary: 154 mg/dL — ABNORMAL HIGH (ref 70–99)
Glucose-Capillary: 154 mg/dL — ABNORMAL HIGH (ref 70–99)
Glucose-Capillary: 178 mg/dL — ABNORMAL HIGH (ref 70–99)
Glucose-Capillary: 180 mg/dL — ABNORMAL HIGH (ref 70–99)
Glucose-Capillary: 160 mg/dL — ABNORMAL HIGH (ref 70–99)

## 2022-03-13 MED ORDER — JEVITY 1.5 CAL/FIBER PO LIQD
237.0000 mL | Freq: Three times a day (TID) | ORAL | Status: DC
Start: 2022-03-13 — End: 2022-03-21
  Administered 2022-03-13 – 2022-03-21 (×24): 237 mL
  Filled 2022-03-13: qty 237
  Filled 2022-03-13: qty 1000
  Filled 2022-03-13 (×6): qty 237
  Filled 2022-03-13: qty 1000
  Filled 2022-03-13 (×3): qty 237
  Filled 2022-03-13: qty 1000
  Filled 2022-03-13 (×5): qty 237
  Filled 2022-03-13 (×2): qty 1000
  Filled 2022-03-13: qty 237
  Filled 2022-03-13: qty 1000
  Filled 2022-03-13 (×5): qty 237

## 2022-03-13 MED ORDER — JEVITY 1.5 CAL/FIBER PO LIQD
474.0000 mL | Freq: Every day | ORAL | Status: DC
  Administered 2022-03-13: 474 mL
  Filled 2022-03-13 (×2): qty 474

## 2022-03-13 MED ORDER — ZINC OXIDE 12.8 % EX OINT: TOPICAL_OINTMENT | CUTANEOUS | Status: DC | PRN

## 2022-03-13 MED ORDER — ACETAMINOPHEN 650 MG RE SUPP: 650.0000 mg | RECTAL | Status: DC | PRN

## 2022-03-13 MED ORDER — FREE WATER
100.0000 mL | Freq: Four times a day (QID) | Status: DC
Start: 2022-03-13 — End: 2022-03-15
  Administered 2022-03-13 – 2022-03-15 (×8): 100 mL

## 2022-03-13 MED ORDER — ACETAMINOPHEN 160 MG/5ML PO SOLN
650.0000 mg | ORAL | Status: DC | PRN
Start: 2022-03-13 — End: 2022-03-21
  Administered 2022-03-13 – 2022-03-21 (×18): 650 mg
  Filled 2022-03-13 (×18): qty 20.3

## 2022-03-13 MED ORDER — ACETAMINOPHEN 325 MG PO TABS
650.0000 mg | ORAL_TABLET | ORAL | Status: DC | PRN
  Administered 2022-03-13: 650 mg
  Filled 2022-03-13: qty 2

## 2022-03-13 MED ORDER — INSULIN GLARGINE-YFGN 100 UNIT/ML ~~LOC~~ SOLN
25.0000 [IU] | Freq: Two times a day (BID) | SUBCUTANEOUS | Status: DC
Start: 2022-03-13 — End: 2022-03-21
  Administered 2022-03-13 – 2022-03-21 (×16): 25 [IU] via SUBCUTANEOUS
  Filled 2022-03-13 (×18): qty 0.25

## 2022-03-13 MED ORDER — JEVITY 1.5 CAL/FIBER PO LIQD
1000.0000 mL | ORAL | Status: DC
  Filled 2022-03-13: qty 1000

## 2022-03-13 MED ORDER — JUVEN PO PACK
1.0000 | PACK | Freq: Two times a day (BID) | ORAL | Status: DC
  Administered 2022-03-13 – 2022-03-21 (×18): 1
  Filled 2022-03-13 (×19): qty 1

## 2022-03-13 MED ORDER — ACETAMINOPHEN 650 MG RE SUPP
650.0000 mg | RECTAL | Status: DC | PRN
Start: 2022-03-13 — End: 2022-03-21

## 2022-03-13 NOTE — Assessment & Plan Note (Signed)
Status post tracheostomy.  Has tolerated 48 of trach collar trial Strong cough productive of clear secretions.   - Ready for transfer. - For first tracheostomy change 6/1

## 2022-03-13 NOTE — Assessment & Plan Note (Addendum)
ID following Moving arms, no leg movement Still not able to swallow  - Continue ceftriaxone - Will need PEG

## 2022-03-13 NOTE — Assessment & Plan Note (Signed)
-   Trial off DDAVP and free water

## 2022-03-13 NOTE — Assessment & Plan Note (Signed)
Due to UTI with abscess formation.

## 2022-03-13 NOTE — Assessment & Plan Note (Signed)
Likely inciting event

## 2022-03-13 NOTE — Progress Notes (Addendum)
Occupational Therapy Treatment Patient Details Name: Gabrielle Vincent MRN: [id number] DOB: [birth year] Today's Date: 03/13/2022   History of present illness 56 y.o. female presents to [facility] hospital on 02/14/2022 with complaints of severe back pain associated with hematuria and dysuria. Pt admitted for management of UTI and AKI. MRI demonstrates small herniated nucleus pulposus at L3-L4 on the left side. 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 Pt intubated 02/16/2022. Cultures positive for meningitis. MRI 02/23/2022 demonstrates diffuse epidural abscess with cord compression and likely signal change. Pt underwent C3-T1 decompressive laminectomy with epidural abscess evaucation along with C3-7 fusion on 5/13. MRI 5/16 demonstrates Extensive thoracic epidural abscess. 5/18- Pt underwent Thoracolumbar laminectomy and decompression of epidural abscess. Extubated 5/19. 5/24 reintubated, 5/25 chest tube placement and MRI with new area of ischemia in central pons. Trachostomy on 5/26. PMH includes obesity, HLD, diverticulosis.   OT comments  Pt making gradual progress towards OT goals this session. Pt greeted supine in bed nodding in agreement to OT session. Session focus on further assessment of BUE strength and ROM for ADL participation. Sat pt in chair position to with Pt able to work on active assist shoulder ROM as indicated below as pt continues to present with impaired BUE strength and endurance grossly 2/5 with RUE>LUE. Issued pt modified squeeze balls to work on functional grasp and release and instructed pt to work on passing squeeze balls from R<>L hand to improve ROM and motor planning. Pt would continue to benefit from skilled occupational therapy while admitted and after d/c to address the below listed limitations in order to improve overall functional mobility and facilitate independence with BADL participation. DC plan remains appropriate, will follow acutely per POC.       Recommendations for follow up therapy are one component of a multi-disciplinary discharge planning process, led by the attending physician.  Recommendations may be updated based on patient status, additional functional criteria and insurance authorization.    Follow Up Recommendations  Acute inpatient rehab (3hours/day)    Assistance Recommended at Discharge    Patient can return home with the following  Two people to help with walking and/or transfers;Two people to help with bathing/dressing/bathroom;Assistance with cooking/housework;Assistance with feeding;Direct supervision/assist for medications management;Direct supervision/assist for financial management;Assist for transportation;Help with stairs or ramp for entrance   Equipment Recommendations  Other (comment) (pending pt progress)    Recommendations for Other Services      Precautions / Restrictions Precautions Precautions: Fall;Back Precaution Booklet Issued: No Restrictions Weight Bearing Restrictions: No       Mobility Bed Mobility                    Transfers                         Balance                                           ADL either performed or assessed with clinical judgement   ADL                                         General ADL Comments: session focus on BUE strength and AROM for ADL participation    Extremity/Trunk  Assessment Upper Extremity Assessment Upper Extremity Assessment: RUE deficits/detail;LUE deficits/detail RUE Deficits / Details: PROM WFL, pt is grossly 2/5 at this time mmt LUE worse than RUE RUE Sensation: WNL RUE Coordination: decreased fine motor;decreased gross motor LUE Deficits / Details: PROM WFL, pt is grossly 2/5 at this time LUE Sensation: WNL LUE Coordination: decreased fine motor;decreased gross motor   Lower Extremity Assessment Lower Extremity Assessment: Defer to PT evaluation   Cervical / Trunk  Assessment Cervical / Trunk Assessment: Back Surgery    Vision   Vision Assessment?: Vision impaired- to be further tested in functional context Additional Comments: unable to fully assess d/t cog   Perception Perception Perception: Not tested   Praxis Praxis Praxis: Not tested    Cognition Arousal/Alertness: Awake/alert Behavior During Therapy: Flat affect (attempting to speak but unable to reach her lips) Overall Cognitive Status: Difficult to assess                                 General Comments: pt follows commands well during session, needing increased time.        Exercises Other Exercises Other Exercises: sat pt up in chair position and worked on scapular protraction/retraction with BUEs positioned on bed rails, pt needed more assist on LUE vs RUE, + time and effort noted Other Exercises: issued pt modified squeeze balls to work on functional digit flexion/extension Other Exercises: practiced horizontal shoulder ADD/ABD with pt instructed to pass wash cloth squeeze to R and L hand to promote increased BUE strengh and ROM for ADL participation    Shoulder Instructions       General Comments pt on 8L 35% FiO2 HR 115-120 bpm, pt coughing throughout session, used suction as needed to clean area around trach    Pertinent Vitals/ Pain       Pain Assessment Pain Assessment: Faces Faces Pain Scale: Hurts a little bit Pain Location: general discomfort with coughing Pain Descriptors / Indicators: Grimacing Pain Intervention(s): Limited activity within patient's tolerance, Monitored during session, Repositioned  Home Living                                          Prior Functioning/Environment              Frequency  Min 2X/week        Progress Toward Goals  OT Goals(current goals can now be found in the care plan section)  Progress towards OT goals: Progressing toward goals (gradually)  Acute Rehab OT Goals Patient  Stated Goal: unable to state Time For Goal Achievement: 03/19/22 Potential to Achieve Goals: [place] Discharge plan remains appropriate;Frequency remains appropriate    Co-evaluation                 AM-PAC OT "6 Clicks" Daily Activity     Outcome Measure   Help from another person eating meals?: Total Help from another person taking care of personal grooming?: A Lot Help from another person toileting, which includes using toliet, bedpan, or urinal?: Total Help from another person bathing (including washing, rinsing, drying)?: Total Help from another person to put on and taking off regular upper body clothing?: Total Help from another person to put on and taking off regular lower body clothing?: Total 6 Click Score: 7    End of Session Equipment Utilized  During Treatment: Oxygen  OT Visit Diagnosis: Other abnormalities of gait and mobility (R26.89);Muscle weakness (generalized) (M62.81);Pain Pain - part of body:  (generalized d/t coughing)   Activity Tolerance Patient tolerated treatment well   Patient Left in bed;with call bell/phone within reach;with bed alarm set   Nurse Communication Mobility status        Time: 3361-2244 OT Time Calculation (min): 25 min  Charges: OT General Charges $OT Visit: 1 Visit OT Treatments $Therapeutic Activity: 23-37 mins  Harley Alto., COTA/L Acute Rehabilitation Services [phone]   Gabrielle Vincent 03/13/2022, 11:43 AM

## 2022-03-13 NOTE — Assessment & Plan Note (Signed)
Likely secondary from extension from epidural abscess.

## 2022-03-13 NOTE — Telephone Encounter (Signed)
This pulmonary patient has been in ICU at [facility] since May 3 and has no projected discharge date.  Her sister-in-law, Baird Kay, (also her durable power of attorney) dropped FMLA paperwork off in the [place] office last week because she was told the physicians at the hospital do not handle these documents.  I've talked to Ms. Amedeo Plenty and she said she will also be bringing short term disability paperwork to the office in the next few days.  In addition, they have hired a Chief Executive Officer who will be contacting us to fill out permanent disability forms.  The patient has used up all her personal leave at her job and FMLA is required in order for her to continue to get a paycheck.  At this time, there is no provider in the office assigned to this patient.  I've emailed Etta Grandchild and Dr. Vaughan Browner to find out which office provider to assign the patient to in order to fill out and sign FMLA and future disability paperwork.  Sister-in-law - Baird Kay ph# [phone]

## 2022-03-13 NOTE — Progress Notes (Signed)
Patient transferred to 3W with no difficulties. All belongings in room transfer to new room with patient. Sister Crystal called and updated/informed.

## 2022-03-13 NOTE — Progress Notes (Addendum)
NAME:  Gabrielle Vincent, MRN:  [id number], DOB:  02-Dec-1965, LOS: 58 ADMISSION DATE:  02/14/2022, CONSULTATION DATE:  02/16/2022 REFERRING MD:  Tana Coast - TRH CHIEF COMPLAINT:  Acute encephalopathy and sepsis    History of Present Illness:  56 year old woman initially admitted 5/3 for severe back pain radiating down her leg with associated hematuria/dysuria.  Prolonged hospital course; eventually found to have new AKI with E. Coli urosepsis and bacterial meningitis secondary to multiple epidural abscesses (s/p decompressive laminectomies). Patient was also noted to have acute herniated disc (L3-L4) with L lumbar radiculopathy. Hospital course c/b septic shock, central DI and prolonged intubation (now s/p tracheostomy 5/26).  Pertinent Medical History:  Class II obesity, hepatic steatosis, seasonal allergies, varicose veins diverticulosis, HL   Significant Hospital Events: Including procedures, antibiotic start and stop dates in addition to other pertinent events   5/3 admitted with back pain and urinary tract infection , Further complicated by AKI hyponatremia and multiple metabolic derangements.   CT imaging for renal stones showed no acute abdominal pelvic findings there was no obstructive uropathy there was severe fatty liver disease there was cholelithiasis but no cholecystitis there is colonic diverticulosis but no diverticulitis, mild circumferential thickening of the esophagus globular enlargement of the uterus with radiology recommending nonemergent pelvic ultrasound found to have uterine fibroid abdominal Ultrasound showed fatty liver but was epididymides negative.   MRI of the lumbar spine showed small left subarticular disc extrusion with inferior migration at L3-L4 correlating with radiculopathy.  She was started on ceftriaxone cultures were sent 5/4 increased confusion CT brain obtained raising concern for possible acute infarct in the right basal ganglia 5/5 progressive encephalopathy ,  Worsening leukocytosis, hypernatremia, hyperchloremia, slowly improving renal function, slowly improving procalcitonin, moved to ICU due to delirium, intubated for airway protection and to facilitate MRI imaging right IJ central line placed due to limited IV access.  Seen by neurology, also seen by infectious disease with ceftriaxone changed to cefazolin 5/6 antibiotics changed to meningitis coverage given MRI findings of fluid in the ventricles.  Image guided LP ordered 5/8 LP done with purulent CSF, studies consistent with bacterial meningitis, culture growing gm + cocci 5/10 significant rise in sodium with massive urine output of greater than 7 L leading to concern for development of central DI.  DDAVP and volume resuscitation started 5/11 hypernatremia slowly downtrending, continue DDAVP and IV resuscitation per nephrology 5/12 Sodium downtrended to 147 this am 5/13 to OR for posterior cervical decompressive laminectomy and medial facetectomy C3-T1 for evacuation of epirudal abscess, posterior cervical arthrodesis C3-C7, segmental fixation C3-7 5/18 OR for thoracolumbar laminectomy and decompression of epidural abscess from T-L spine 5/19 extubated to [facility] 5/24 reintubated 5/25 chest tube. MRI with ongoing meningitis + new area of ischemia in central pons 5/26 Tracheostomy created. 5/30 Tolerating TCT x 48H. Ongoing swallow dysfunction, SLP following. Unable to tolerate PMV. Likely will need PEG. WOC assessed rectal wound.  Interim History / Subjective:  Overall slow forward progress Tolerating TCT for > 48H Ongoing moderate thick secretions/persistent cough WOC engaged for anal sphincter wound/breakdown (see Media tab) TF to Jevity with fiber to promote less liquid stools D/c liquid VT meds as able Stable for transfer to floor  Objective:  Blood pressure 123/66, pulse (!) 125, temperature (!) 100.9 F (38.3 C), resp. rate 20, height '5\' 4"'$  (1.626 m), weight 95.1 kg, SpO2 96 %.    FiO2  (%):  [35 %] 35 %   Intake/Output Summary (Last 24 hours) at 03/13/2022 1621  Last data filed at 03/13/2022 1507 Gross per 24 hour  Intake 2330 ml  Output 3975 ml  Net -1645 ml    Filed Weights   03/08/22 0500 03/10/22 0500 03/12/22 0500  Weight: 89.7 kg 89.7 kg 95.1 kg   Physical Examination: General: Acute-on-chronically ill-appearing middle-aged woman in NAD. Coughing, appears uncomfortable. HEENT: [place]/AT, anicteric sclera, PERRL, moist mucous membranes. Tracheostomy in place Shiley #6 cuffed. Copious moderately thick clear-yellow secretions. Neuro: Awake, oriented x 4. Responds to verbal stimuli. Following commands consistently. Moves BUE spontaneously. Flicker to pain in  BLE. +Corneal, +Cough, and +Gag  CV: RRR, no m/g/r. PULM: Breathing even and unlabored on ATC. Lung fields coarse in upper fields bilaterally. GI: Soft, nontender, nondistended. Normoactive bowel sounds. Extremities: Bilateral symmetric LE edema noted. Skin: Warm/dry, no rashes. Rectal wound as described by WOC (see Media tab).  Resolved medical problems  AKI due to septic ATN, resolved NAGMA - resolved  Anasarca with hypervolemia Shock liver superimposed on known fatty liver disease Septic shock  Assessment & Plan:  Principal Problem:   Abscess in epidural space of cervical spine Active Problems:   Sepsis secondary to UTI (HCC)   Hyponatremia   Hypokalemia   AKI (acute kidney injury) (HCC)   Abnormal LFTs   Class II obesity   Hepatic steatosis   Cholelithiasis   Esophageal thickening   Acute low back pain   Encephalopathy acute   Acute respiratory failure (HCC)   Radiculopathy of lumbar region   E. 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 - Continue ASA - Continue secondary stroke ppx  DM2 poorly controlled  - Continue Lantus 25U - Continue SSI - CBG goal 140-180  At risk for malnutrition Gross deconditioning in the setting of critical illness, new paraplegia - Nutrition/RD following, appreciate assistance - TF transition to Jevity + fiber, bolus feels to promote less liquid stool if possible - Will likely need PEG - PT/OT/SLP as tolerated, appreciate help  Rectal wound 2/2 continuous incontinence and skin breakdown - WOC following, appreciate recs  Anemia of critical illness - Trend H&H - Transfuse for Hgb <  7.0 or signs/symptoms of active bleeding  Best Practice (right click and "Reselect all SmartList  Selections" daily)   Diet/type: TF DVT prophylaxis: prophylactic heparin  GI prophylaxis: PPI Lines: NA Foley:  Yes, and it is still needed- DI Code Status:  full code Last date of multidisciplinary goals of care discussion: 5/25 Spoke with patient brother and 2 sisters, decision was to proceed with tracheostomy and continue full scope of care  Critical care time: N/A   Rhae Lerner [facility] 03/13/22 4:21 PM  Please see [email or web address] for pager details.  From 7A-7P if no response, please call [phone] After hours, please call ELink [phone]

## 2022-03-13 NOTE — Assessment & Plan Note (Signed)
Large rectal ulceration due to fecal management system.  - allow healing by secondary intention.  - fiber and bolus feeding to decrease wound contamination.

## 2022-03-13 NOTE — Progress Notes (Signed)
[facility]  Date of Admission:  02/14/2022     ASSESSMENT: 56 year old female currently hospitalized for E. coli UTI with E. coli meningitis, thoracic/lumbar spinal abscess and spinal cord infarct.  Her septic shock is improving slowly however still spiking fever with Tmax 100.9 on 5/29 and trending up WBC despite a prolonged course of antibiotic.  Source control has not been completed as she still has residual epidural abscess seen at T11, T12.  There is also a concern of possible early discitis/osteomyelitis at C4-C6.  Pending neurosurgery reevaluation.  Her electrolytes and kidney function are improving.  Now there is also concern of sacral pressure ulcer and irritating dermatitis secondary to paraplegia.  She is prone to more infection there.  Poor prognosis overall.  Continue ongoing goals of care conversation with family and PCCM team.  PLAN: Continue ceftriaxone 2 g twice daily Continue wound care for sacral ulcer Pending neurosurgery evaluation Continue goals of care discussion  Principal Problem:   Abscess in epidural space of cervical spine Active Problems:   Sepsis secondary to UTI ([place])   Hyponatremia   Hypokalemia   AKI (acute kidney injury) ([place])   Abnormal LFTs   Class II obesity   Hepatic steatosis   Cholelithiasis   Esophageal thickening   Acute low back pain   Encephalopathy acute   Acute respiratory failure (HCC)   Radiculopathy of lumbar region   E. coli UTI   Meningitis   Bacterial meningitis   Diabetes insipidus ([place])   H/O excision of lamina of cervical vertebra for decompression of spinal cord   Acute pyelonephritis   E coli bacteremia   Primary spontaneous pneumothorax   Scheduled Meds:  aspirin  81 mg Per Tube Daily   chlorhexidine gluconate (MEDLINE KIT)  15 mL Mouth Rinse BID   Chlorhexidine Gluconate Cloth  6 each Topical Daily   desmopressin  0.05 mg Per Tube BID   feeding supplement (PROSource TF)   90 mL Per Tube BID   free water  200 mL Per Tube Q4H   Gerhardt's butt cream   Topical BID   heparin injection (subcutaneous)  5,000 Units Subcutaneous Q8H   insulin aspart  0-15 Units Subcutaneous Q4H   insulin aspart  8 Units Subcutaneous Q4H   insulin glargine-yfgn  22 Units Subcutaneous BID   mouth rinse  15 mL Mouth Rinse 10 times per day   midodrine  20 mg Per Tube Q8H   pantoprazole sodium  40 mg Per Tube Q1400   Continuous Infusions:  cefTRIAXone (ROCEPHIN)  IV Stopped (03/12/22 2158)   feeding supplement (OSMOLITE 1.5 CAL) 1,000 mL (03/12/22 1503)   PRN Meds:.acetaminophen (TYLENOL) oral liquid 160 mg/5 mL, acetaminophen **OR** acetaminophen, bisacodyl, docusate, fentaNYL, fentaNYL (SUBLIMAZE) injection, ipratropium-albuterol, lip balm, menthol-cetylpyridinium **OR** phenol, [DISCONTINUED] ondansetron **OR** ondansetron (ZOFRAN) IV, ondansetron **OR** [DISCONTINUED] ondansetron (ZOFRAN) IV, oxyCODONE, polyethylene glycol, senna   SUBJECTIVE: Patient is seen at bedside.  She is nonverbal and sick appearing.  RN reports sacral pressure ulcer with dermatitis due to loose stools.  Review of Systems: ROS Per HPI  Allergies  Allergen Reactions   Erythromycin Anaphylaxis   Penicillins Nausea Only   Prednisone Other (See Comments)    Lymph node swelling    OBJECTIVE: Vitals:   03/13/22 0700 03/13/22 0751 03/13/22 0800 03/13/22 0900  BP: (!) 113/51  (!) 121/49 (!) 122/30  Pulse: 96 97 92 94  Resp: 19 20 (!) 28 (!) 21  Temp: 99.5 F (37.5 C) 99.3 F (37.4 C) 99.3 F (37.4 C) 99.3 F (37.4 C)  TempSrc:   Bladder   SpO2: 97% 97% 95% 99%  Weight:      Height:       Body mass index is 35.99 kg/m.  Physical Exam Constitutional:      Appearance: She is ill-appearing.  HENT:     Head: Normocephalic.  Eyes:     General:        Right eye: No discharge.        Left eye: No discharge.     Conjunctiva/sclera: Conjunctivae normal.  Neck:     Comments: Tracheostomy  in place Pulmonary:     Effort: Pulmonary effort is normal. No respiratory distress.  Neurological:     Mental Status: She is alert.     Comments: Nonverbal    Lab Results Lab Results  Component Value Date   WBC 16.7 (H) 03/12/2022   HGB 8.5 (L) 03/12/2022   HCT 27.1 (L) 03/12/2022   MCV 93.1 03/12/2022   PLT 194 03/12/2022    Lab Results  Component Value Date   CREATININE 0.77 03/12/2022   BUN 16 03/12/2022   NA 133 (L) 03/12/2022   K 3.7 03/12/2022   CL 104 03/12/2022   CO2 21 (L) 03/12/2022    Lab Results  Component Value Date   ALT 15 03/03/2022   AST 18 03/03/2022   ALKPHOS 90 03/03/2022   BILITOT 0.7 03/03/2022     Microbiology: Recent Results (from the past 240 hour(s))  Culture, blood (Routine X 2) w Reflex to ID Panel     Status: None   Collection Time: 03/04/22  2:50 PM   Specimen: BLOOD  Result Value Ref Range Status   Specimen Description BLOOD BLOOD LEFT HAND  Final   Special Requests   Final    BOTTLES DRAWN AEROBIC AND ANAEROBIC Blood Culture adequate volume   Culture   Final    NO GROWTH 5 DAYS Performed at [facility], [address], [place], [place] [address]    Report Status 03/09/2022 FINAL  Final  Culture, blood (Routine X 2) w Reflex to ID Panel     Status: None   Collection Time: 03/04/22  2:50 PM   Specimen: BLOOD  Result Value Ref Range Status   Specimen Description BLOOD BLOOD LEFT HAND  Final   Special Requests   Final    BOTTLES DRAWN AEROBIC AND ANAEROBIC Blood Culture results may not be optimal due to an inadequate volume of blood received in culture bottles   Culture   Final    NO GROWTH 5 DAYS Performed at [facility], [place] [address] [place], [place] [address]    Report Status 03/09/2022 FINAL  Final  Culture, Respiratory w Gram Stain     Status: None   Collection Time: 03/08/22 11:51 AM   Specimen: Tracheal Aspirate; Respiratory  Result Value Ref Range Status   Specimen Description TRACHEAL ASPIRATE   Final   Special Requests NONE  Final   Gram Stain NO WBC SEEN NO ORGANISMS SEEN   Final   Culture   Final    RARE CANDIDA ALBICANS NO STAPHYLOCOCCUS AUREUS ISOLATED No Pseudomonas species isolated Performed at [facility], [address], [place], [place] [address]    Report Status 03/11/2022 FINAL  Final  Culture, Respiratory w Gram Stain     Status: None   Collection Time: 03/09/22  3:47 PM   Specimen: Bronchoalveolar Lavage; Respiratory  Result Value Ref Range Status   Specimen Description BRONCHIAL ALVEOLAR LAVAGE  Final   Special Requests NONE  Final   Gram Stain   Final    MODERATE WBC PRESENT,BOTH PMN AND MONONUCLEAR RARE BUDDING YEAST SEEN Performed at [facility], [address], [place], [place] [address]    Culture MODERATE CANDIDA ALBICANS  Final   Report Status 03/12/2022 FINAL  Final    Gaylan Gerold, MD [facility]  03/13/2022, 10:28 AM

## 2022-03-13 NOTE — Consult Note (Signed)
[place] Nurse Consult Note: Patient receiving care in [place]. Primary RN, Attending MD, and PA working with the attending MD at bedside to evaluate area at the anus. Reason for Consult: medical device related anal injury Wound type: full-thickness related Medical Device Related Pressure Injury to the anal sphincter area. See photo from today Pressure Injury POA: Yes/No/NA Measurement: Wound VWU:[id number] Drainage (amount, consistency, odor) patient constantly oozes stool. A Flexiseal Fecal Containment system has been being used. Not in place today, will not remain in place but rather slips out even with bulb inflated Periwound: intact Dressing procedure/placement/frequency: Triple Paste prn after each pericleaning occurrence.  Also, the patient has an intact maroon colored, dime sized DTPI to the left posterior heel.  Foam heel dressing in place. Primary nurse had removed the rigid "boots" that had been on the patient and provided a pair of soft Prevalon Heel lift boots to prevent further heel injury.  Monitor the wound area(s) for worsening of condition such as: Signs/symptoms of infection,  Increase in size,  Development of or worsening of odor, Development of pain, or increased pain at the affected locations.  Notify the medical team if any of these develop.  Val Riles, RN, MSN, CWOCN, CNS-BC, pager [phone]

## 2022-03-13 NOTE — Assessment & Plan Note (Signed)
Mental status has cleared. Patient following commands and mouths appropriate responses.  - PT/OT - Passy-Muir valve training

## 2022-03-13 NOTE — Progress Notes (Addendum)
Nutrition Follow-up  DOCUMENTATION CODES:   Obesity unspecified  INTERVENTION:   Tube feeding via cortrak tube:  237 ml Jevity 1.5 TID (0800, 1200, 1600) 474 ml Jevity daily (2000)  50 ml free water flush before and after each bolus feeding  Provides 1935 kcal, 119 gm protein, 900 ml free water daily  Juven BID  NUTRITION DIAGNOSIS:   Inadequate oral intake related to inability to eat as evidenced by NPO status. Ongoing.   GOAL:   Provide needs based on ASPEN/SCCM guidelines Met with TF at goal   MONITOR:   Vent status, TF tolerance, Labs, Weight trends, Skin  REASON FOR ASSESSMENT:   Consult Enteral/tube feeding initiation and management  ASSESSMENT:   Pt with PMH of aortic atherosclerosis, obesity, hepatic steatosis, HLD, and lymphnode drainage surgery admitted with acute severe lower back pain radiating to the LE, radiculopathy, AKI, and acute transaminates.   Pt discussed during ICU rounds and with RN and MD. Per RN pt has developed a large wound at her anus from prolong use of rectal tube which has now been removed. Discussed with MD, plan to transition back to Jevity 1.5 and to trial bolus feedings. Also recommend imodium as pt has continued to have diarrhea while on antibiotics and frequent doses of liquid tylenol.   Pt has remained on trach collar since 5/28. SLP following pt having difficulty with PMV trials, SLP recommends PEG due to suspected long term swallow issues.   ID following for e coli UTI with extensive epidural abscess and associated meningitis.  Pt with newly dx DM. 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  Pt on DDAVP for DI.  Per CCM pt with RUL nodule concerning ofr primary lung cancer.   Per ID pt has residual epidural abscess seen at T11, T12 also possible early discitis/osteomyelitis at C4-C6.   Admission weight: 95.3 kg Current weight: 95.1 kg  Non-pitting edema   5/3 admitted with back pain and UTI 5/5 intubated with  progressive encephalopathy 5/8 s/p LP consistent with bacterial meningitis 5/10 developed DI due to meningitis  5/13 s/p OR for decompressive laminectomy and evacuation of epidural abscess 5/18 s/p lami and decompression of epidural abscess  5/19 extubated 5/22 s/p cortrak placement; tip in distal stomach  5/24 re-intubated; TF held 5/26 TF restarted; trach placed    Medications reviewed and include: SSI, 8 units every 4 hours novolog, 22 units BID semglee, protonix IV Rocephin  Labs reviewed: Na 133 Vitamin B12: 4495 (5/5)  A1C: 8.9 (5/3) CBG's: 122-194  UOP: 4350 ml  CT: 0 ml - removed 5/29  Diet Order:   Diet Order             Diet NPO time specified  Diet effective now                   EDUCATION NEEDS:   No education needs have been identified at this time  Skin:  Skin Assessment: Skin Integrity Issues: Skin Integrity Issues:: DTI, Stage I DTI: anus, L heel Stage I: R heel  Last BM:  5/29 x 2 medium; type 7  Height:   Ht Readings from Last 1 Encounters:  03/07/22 5' 4"  (1.626 m)    Weight:   Wt Readings from Last 1 Encounters:  03/12/22 95.1 kg    Ideal Body Weight:  54.54 kg  BMI:  Body mass index is 35.99 kg/m.  Estimated Nutritional Needs:   Kcal:  1850-2100  Protein:  110-120 grams  Fluid:  >1.8 L/day  Lockie Pares., RD, LDN, CNSC See AMiON for contact information

## 2022-03-13 NOTE — Progress Notes (Signed)
Patient ID: Gabrielle Vincent, female   DOB: 07/19/1966, 56 y.o.   MRN: [id number] Patient transferred to floor today. She is off ventilator mouthing words and is showing some improvement in her motor function in the upper extremities with more though trace function in her biceps and brachioradialis. Patient continues however to have a low-grade temperature with a Tmax of 100.9 over the last several days.  She also has an elevated white count at 16.7 or 16.8 thousand. Patient remains on antibiotic for her E. coli sepsis. There were some areas of concern on her last MRI at the C5-C6 vertebral level and with some persistent pus in the thoracolumbar epidural space. Though neither of those appeared surgical a follow-up MRI would be considered if the patient's temperature and white count do not improve. At this point given her improved level of consciousness, I am comfortable with observing her clinically.

## 2022-03-13 NOTE — Progress Notes (Signed)
Speech Language Pathology Treatment: [name] Speaking valve  Patient Details Name: Gabrielle Vincent MRN: [id number] DOB: [birth year] Today's Date: 03/13/2022 Time: 8466-5993 SLP Time Calculation (min) (ACUTE ONLY): 10 min  Assessment / Plan / Recommendation Clinical Impression  Pt continues to have difficulty tolerating her PMV - all attempts to place it lead to immediate coughing and no audible phonation.  Ceased trials after four unsuccessful attempts.  Will continue efforts.  Pt is not ready for a swallow assessment and I anticipate it will be some time before a PO diet is an option. May want to explore PEG placement.  SLP will continue to follow for readiness and for ongoing PMV trials. D/W Dr. Lynetta Mare.    HPI HPI: 56 y.o. female presents 02/14/2022 with severe back pain associated with hematuria and dysuria. Found to have UTI and  AKI. MRI demonstrated small herniated nucleus pulposus at L3-L4. 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 Cultures positive for meningitis. MRI spine demonstrates diffuse epidural abscess with cord compression and likely signal change. Pt underwent C3-T1 decompressive laminectomy with epidural abscess evaucation along with C3-7 fusion on 5/13. MRI 5/16 extensive thoracic epidural abscess. 5/18- Pt underwent thoracolumbar laminectomy and decompression of epidural abscess. Extubated 5/19. Swallow evaluated 5/21 with recs for NPO; high aspiration risk. Reintubated 5/25; 5/25 repeat MRI with ongoing meningitis + new area of ischemia in central pons. Trach 5/26.  PMH includes obesity, HLD, diverticulosis.      SLP Plan  Continue with current plan of care      Recommendations for follow up therapy are one component of a multi-disciplinary discharge planning process, led by the attending physician.  Recommendations may be updated based on patient status, additional functional  criteria and insurance authorization.    Recommendations         Patient may use Passy-Muir Speech Valve: with SLP only         Oral Care Recommendations: Oral care QID Follow Up Recommendations: Acute inpatient rehab (3hours/day) SLP Visit Diagnosis: Aphonia (R49.1) Plan: Continue with current plan of care         Prajwal Fellner L. Tivis Ringer, [place] Office number [phone] Pager [phone]   [name]  03/13/2022, 10:37 AM

## 2022-03-14 ENCOUNTER — Inpatient Hospital Stay (HOSPITAL_COMMUNITY): Payer: BC Managed Care – PPO

## 2022-03-14 DIAGNOSIS — N390 Urinary tract infection, site not specified: Secondary | ICD-10-CM | POA: Diagnosis not present

## 2022-03-14 DIAGNOSIS — G061 Intraspinal abscess and granuloma: Secondary | ICD-10-CM | POA: Diagnosis not present

## 2022-03-14 DIAGNOSIS — J9601 Acute respiratory failure with hypoxia: Secondary | ICD-10-CM | POA: Diagnosis not present

## 2022-03-14 DIAGNOSIS — E871 Hypo-osmolality and hyponatremia: Secondary | ICD-10-CM

## 2022-03-14 DIAGNOSIS — E669 Obesity, unspecified: Secondary | ICD-10-CM

## 2022-03-14 DIAGNOSIS — E876 Hypokalemia: Secondary | ICD-10-CM

## 2022-03-14 DIAGNOSIS — M5416 Radiculopathy, lumbar region: Secondary | ICD-10-CM

## 2022-03-14 DIAGNOSIS — N10 Acute tubulo-interstitial nephritis: Secondary | ICD-10-CM | POA: Diagnosis not present

## 2022-03-14 DIAGNOSIS — B9620 Unspecified Escherichia coli [E. coli] as the cause of diseases classified elsewhere: Secondary | ICD-10-CM | POA: Diagnosis not present

## 2022-03-14 DIAGNOSIS — R7989 Other specified abnormal findings of blood chemistry: Secondary | ICD-10-CM | POA: Diagnosis not present

## 2022-03-14 DIAGNOSIS — L89309 Pressure ulcer of unspecified buttock, unspecified stage: Secondary | ICD-10-CM

## 2022-03-14 LAB — GLUCOSE, CAPILLARY
Glucose-Capillary: 121 mg/dL — ABNORMAL HIGH (ref 70–99)
Glucose-Capillary: 125 mg/dL — ABNORMAL HIGH (ref 70–99)
Glucose-Capillary: 143 mg/dL — ABNORMAL HIGH (ref 70–99)
Glucose-Capillary: 184 mg/dL — ABNORMAL HIGH (ref 70–99)
Glucose-Capillary: 195 mg/dL — ABNORMAL HIGH (ref 70–99)
Glucose-Capillary: 201 mg/dL — ABNORMAL HIGH (ref 70–99)

## 2022-03-14 LAB — CBC WITH DIFFERENTIAL/PLATELET
Abs Immature Granulocytes: 0.54 10*3/uL — ABNORMAL HIGH (ref 0.00–0.07)
Basophils Absolute: 0.1 10*3/uL (ref 0.0–0.1)
Basophils Relative: 1 %
Eosinophils Absolute: 0.1 10*3/uL (ref 0.0–0.5)
Eosinophils Relative: 1 %
HCT: 31.5 % — ABNORMAL LOW (ref 36.0–46.0)
Hemoglobin: 9.5 g/dL — ABNORMAL LOW (ref 12.0–15.0)
Immature Granulocytes: 5 %
Lymphocytes Relative: 19 %
Lymphs Abs: 2.3 10*3/uL (ref 0.7–4.0)
MCH: 28.9 pg (ref 26.0–34.0)
MCHC: 30.2 g/dL (ref 30.0–36.0)
MCV: 95.7 fL (ref 80.0–100.0)
Monocytes Absolute: 1.1 10*3/uL — ABNORMAL HIGH (ref 0.1–1.0)
Monocytes Relative: 10 %
Neutro Abs: 7.7 10*3/uL (ref 1.7–7.7)
Neutrophils Relative %: 64 %
Platelets: 236 10*3/uL (ref 150–400)
RBC: 3.29 MIL/uL — ABNORMAL LOW (ref 3.87–5.11)
RDW: 18.9 % — ABNORMAL HIGH (ref 11.5–15.5)
WBC: 11.9 10*3/uL — ABNORMAL HIGH (ref 4.0–10.5)
nRBC: 1.7 % — ABNORMAL HIGH (ref 0.0–0.2)

## 2022-03-14 LAB — BASIC METABOLIC PANEL
Anion gap: 10 (ref 5–15)
BUN: 26 mg/dL — ABNORMAL HIGH (ref 6–20)
CO2: 25 mmol/L (ref 22–32)
Calcium: 8.9 mg/dL (ref 8.9–10.3)
Chloride: 119 mmol/L — ABNORMAL HIGH (ref 98–111)
Creatinine, Ser: 0.87 mg/dL (ref 0.44–1.00)
GFR, Estimated: >60 mL/min (ref >60)
Glucose, Bld: 122 mg/dL — ABNORMAL HIGH (ref 70–99)
Potassium: 4 mmol/L (ref 3.5–5.1)
Sodium: 154 mmol/L — ABNORMAL HIGH (ref 135–145)

## 2022-03-14 MED ORDER — SODIUM CHLORIDE 0.45 % IV BOLUS
500.0000 mL | Freq: Once | INTRAVENOUS | Status: AC
  Administered 2022-03-14: 500 mL via INTRAVENOUS

## 2022-03-14 MED ORDER — JEVITY 1.5 CAL/FIBER PO LIQD
474.0000 mL | ORAL | Status: DC
  Administered 2022-03-14 – 2022-03-20 (×7): 474 mL
  Filled 2022-03-14 (×3): qty 474
  Filled 2022-03-14: qty 1000
  Filled 2022-03-14 (×4): qty 474
  Filled 2022-03-14: qty 1000

## 2022-03-14 NOTE — Progress Notes (Signed)
Cooling blanket placed '@0320'$  per order. Set to 50 degrees F. Unable to utilize temp probe 2/2 pt's anal injury from rectal tube. Blanket placed on top of sheet above pt due to pt's frequent watery stools. Will reassess temps at least hourly.

## 2022-03-14 NOTE — Progress Notes (Signed)
[facility] Progress Note Patient Name: Gabrielle Vincent DOB: 07/23/1966 MRN: [id number]   Date of Service  03/14/2022  HPI/Events of Note  Multiple issues: 1. Fever to 102.6 F. Refractory to Tylenol. Last blood cultures done 06/04/2022. 2. No CBC ordered for AM to follow WBC count. 3. Urinary retention. Foley catheter removed yesterday. Patient required I/O cath for 1100 mL of urine. Nursing request for a Foley catheter.   eICU Interventions  Plan: Ice packs and cooling blanket PRN. CBC with Platelets in AM.  Blood cultures X 2. I/O Cath PRN.     Intervention Category Major Interventions: Infection - evaluation and management;Other:  Jla Reynolds Cornelia Copa 03/14/2022, 2:24 AM

## 2022-03-14 NOTE — Progress Notes (Signed)
@  [place], Kathlee Nations, notified of pt's persistent fever (102.6 axillary) despite 2 doses of APAP, pt's persistent tachycardia (130s ST), urinary retention (I and O Cath '@2205'$  of 1119ms), and absence of AM CBC despite Dr. EClarice Polenote desiring a trend of WBCs.  Orders later received and implemented. Will continue to monitor.

## 2022-03-14 NOTE — Progress Notes (Signed)
Gabrielle Vincent for Infectious Disease  Date of Admission:  02/14/2022           Reason for visit: Follow up on E coli epidural abscess   Current antibiotics: Ceftriaxone    ASSESSMENT:    56 y.o. female admitted with:  E coli complicated UTI with extensive epidural abscess extending from lumbar to cervical spine with associated meningitis/encephalitis: Status post cervical decompression and instrumentation C3-7 on 5/13 and thoracolumbar laminectomy and decompression epidural abscess on 5/18.  Most recent MRI T and C-spine 5/23 with enhancement in the disc space and facets around C5-6 and posterior meningeal enhancement concerning for persistent infection as well as epidural collection seen from T11 extending into the lumbar spine (measuring 36m, previously 96m.  NSGY is continuing to follow.  Newly diagnosed DM: A1c is 8.9. Diabetes insipidus: Na today is 154. Pressure injury in setting of large rectal ulceration from fecal management system: Receiving wound care and trying to keep area clean.  Right upper lobe 2.2cm spiculating mass: Concerning for malignancy.  CT chest obtained 5/24 with 2.3cm RUL nodule but not diagnostic, however, per CCM appears likely malignancy. Hypoxic respiratory failure status post tracheostomy: Currently on trach collar and transferred to floor 5/30. Urinary retention: In setting of spinal cord injury.  Foley removed yesterday and requiring I and O catheterization.  RN reports plan to replace Foley today.   RECOMMENDATIONS:    Continue ceftriaxone BID Follow up repeat blood cultures Greatly appreciate NSGY continued reassessment Consider repeat MRI later this week if fevers persist and blood cx remain negative Lab monitoring Wound care Glycemic control Will follow   Principal Problem:   Abscess in epidural space of cervical spine Active Problems:   Class II obesity   Encephalopathy acute   Acute respiratory failure (HCC)   E. coli UTI    Bacterial meningitis   [place]id[place]  H/O excision of lamina of cervical vertebra for decompression of spinal cord   E coli bacteremia   Pressure injury of skin    MEDICATIONS:    Scheduled Meds:  aspirin  81 mg Per Tube Daily   chlorhexidine gluconate (MEDLINE KIT)  15 mL Mouth Rinse BID   Chlorhexidine Gluconate Cloth  6 each Topical Daily   feeding supplement (JEVITY 1.5 CAL/FIBER)  237 mL Per Tube TID WC   feeding supplement (JEVITY 1.5 CAL/FIBER)  474 mL Per Tube Q24H   feeding supplement (PROSource TF)  90 mL Per Tube BID   free water  100 mL Per Tube QID   Gerhardt's butt cream   Topical BID   heparin injection (subcutaneous)  5,000 Units Subcutaneous Q8H   insulin aspart  0-15 Units Subcutaneous Q4H   insulin aspart  8 Units Subcutaneous Q4H   insulin glargine-yfgn  25 Units Subcutaneous BID   mouth rinse  15 mL Mouth Rinse 10 times per day   midodrine  20 mg Per Tube Q8H   nutrition supplement (JUVEN)  1 packet Per Tube BID BM   pantoprazole sodium  40 mg Per Tube Q1400   Continuous Infusions:  cefTRIAXone (ROCEPHIN)  IV 2 g (03/14/22 0844)   PRN Meds:.acetaminophen (TYLENOL) oral liquid 160 mg/5 mL **OR** acetaminophen, bisacodyl, docusate, fentaNYL, fentaNYL (SUBLIMAZE) injection, ipratropium-albuterol, lip balm, menthol-cetylpyridinium **OR** phenol, [DISCONTINUED] ondansetron **OR** ondansetron (ZOFRAN) IV, ondansetron **OR** [DISCONTINUED] ondansetron (ZOFRAN) IV, oxyCODONE, polyethylene glycol, senna, Zinc Oxide  SUBJECTIVE:   24 hour events:  Remains febrile with Tmax 102.6 WBC trended down  Blood cx repeated CT abd/pelvis pending for PEG tube placement with IR Sodium is 154 today   She reports pain.  Her nurse reports plan for replacing Foley catheter after bladder scan.  Review of Systems  All other systems reviewed and are negative.    OBJECTIVE:   Blood pressure (!) 143/75, pulse (!) 121, temperature 100 F (37.8 C), temperature  source Axillary, resp. rate 20, height _0  (1.626 m), weight 97.7 kg, SpO2 96 %. Body mass index is 36.97 kg/m.  Physical Exam Constitutional:      Comments: Ill appearing woman, lying in bed, appears uncomfortable.   HENT:     Head: Normocephalic and atraumatic.  Eyes:     Extraocular Movements: Extraocular movements intact.     Conjunctiva/sclera: Conjunctivae normal.  Neck:     Comments: On trach collar. Cardiovascular:     Rate and Rhythm: Tachycardia present.  Pulmonary:     Effort: Pulmonary effort is normal. No respiratory distress.  Abdominal:     General: There is no distension.     Palpations: Abdomen is soft.     Tenderness: There is no abdominal tenderness.  Musculoskeletal:     Comments: She is able to move her right arm.  Skin:    General: Skin is warm and dry.     Findings: No rash.  Neurological:     General: No focal deficit present.     Mental Status: Mental status is at baseline.  Psychiatric:        Mood and Affect: Mood normal.        Behavior: Behavior normal.     Lab Results: Lab Results  Component Value Date   WBC 11.9 (H) 03/14/2022   HGB 9.5 (L) 03/14/2022   HCT 31.5 (L) 03/14/2022   MCV 95.7 03/14/2022   PLT 236 03/14/2022    Lab Results  Component Value Date   NA 154 (H) 03/14/2022   K 4.0 03/14/2022   CO2 25 03/14/2022   GLUCOSE 122 (H) 03/14/2022   BUN 26 (H) 03/14/2022   CREATININE 0.87 03/14/2022   CALCIUM 8.9 03/14/2022   GFRNONAA >60 03/14/2022    Lab Results  Component Value Date   ALT 15 03/03/2022   AST 18 03/03/2022   ALKPHOS 90 03/03/2022   BILITOT 0.7 03/03/2022    No results found for: CRP  No results found for: ESRSEDRATE   I have reviewed the micro and lab results in Epic.  Imaging: No results found.   Imaging independently reviewed in Epic.    Raynelle Highland for Infectious Disease [facility] [phone] pager 03/14/2022, 10:06 AM

## 2022-03-14 NOTE — Progress Notes (Signed)
PT Cancellation Note  Patient Details Name: Gabrielle Vincent MRN: [id number] DOB: May 15, 1966   Cancelled Treatment:    Reason Eval/Treat Not Completed: Medical issues which prohibited therapy. Pts resting HR at 129bpm, on cooling blanket due to increased fever of 102. PT return later and nursing giving patient a bath. Acute PT to return as able, as appropriate to progress mobility.  Gabrielle Vincent, PT, DPT Acute Rehabilitation Services Secure chat preferred Office #: [phone]    Gabrielle Vincent 03/14/2022, 12:57 PM

## 2022-03-14 NOTE — Hospital Course (Addendum)
using hard PROFO boots when I was in. She does have the need for foot drop prevention and she rotates the left foot laterally.  Prevalon boots can be used as well and have less potential to cause device related PI. Will note that in the orders.  Flexiseal has been discontinued this should help with resolution of the anal MDRPI.  New eshcar right occiput. Suggested monitoring and turning head off of area now that the patient is more alert. Cautioned against use of donut cushions or other positioners, may be able to use air plane pillow to readjust head.    7/19

## 2022-03-14 NOTE — Progress Notes (Signed)
RN and I spoke with night secretary and she is ordering inner cannulas for patient.

## 2022-03-14 NOTE — Progress Notes (Signed)
PROGRESS NOTE    Gabrielle Vincent  HWE:[id number] DOB: [birth year] DOA: 02/14/2022 PCP: Merryl Hacker, No    Brief Narrative:  Gabrielle Vincent is a 56 y.o. female with history of allergies, obesity presented to hospital with complaint of severe low back pain radiating to her  lower extremity with hematuria and dysuria.  In the ED, patient was noted to be hyponatremic and hypokalemic with elevated anion gap of 19 and creatinine at 3.6.  She had leukocytosis with WBC at 21.3.  CT renal study did not show obstructive uropathy but hepatomegaly cholecystolithiasis.  Patient was then admitted hospital for acute or severe lower back pain with radiculopathy, acute kidney injury, UTI, elevated LFTs, electrolyte imbalances.  During hospitalization patient had worsening confusion and encephalopathy and had difficulty with airway protection.  Patient was empirically started on antibiotics for meningitis and imaging guided lumbar puncture was performed which showed a purulent CSF consistent with bacterial meningitis.  MRI of the lumbar spine was then ordered and was noted to have multiple epidural abscesses and underwent decompressive laminectomies on 02/24/2022 and 03/01/2022.[name]  Patient also was noted to have acute herniated disc L3-L4 with lumbar radiculopathy.    Patient was initially extubated on 03/02/22 but on 03/07/2022 had to be reintubated.  On 03/09/22 placed in.Hospital course was complicated by septic shock, central diabetes insipidus and recent prolonged intubation which required tracheostomy on 03/09/2022.  Patient had tolerated the trach collar for 48 hours and was considered stable for transfer out of the ICU.  Assessment and plan  Principal Problem:   Abscess in epidural space of cervical spine Active Problems:   Class II obesity   Encephalopathy acute   Acute respiratory failure (HCC)   E. coli UTI   Bacterial meningitis   Diabetes insipidus ([place])   H/O excision of lamina of cervical vertebra for decompression  of spinal cord   E coli bacteremia   Pressure injury of skin   Acute metabolic encephalopathy Secondary to sepsis and infection.  Follows few commands status post tracheostomy.  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 coli complicated UTI with extensive epidural spinal abscess/Bacterial meningitis  Status post C3-T1 laminectomy, evacuation of abscess, thoracolumbar laminectomy/decompression by neurosurgery.  Currently being followed by neurosurgery and infectious disease.  On IV Rocephin.   Shock Improved at this time.  On midodrine 20 mg 3 times daily.  Latest WBC at 11.9.   Acute hypoxic respiratory failure s/p tracheostomy Aspiration pneumonia Incidental finding of right upper lobe 2.2 mm spiculated appearing mass concerning for malignancy Spontaneous right pneumothorax-resolved Continue trach collar.  Being followed by PCCM.   Central diabetes insipidus Hyponatremia Patient received DDAVP during hospitalization.  This has been discontinued at this time.  Latest sodium of 154.  Acute kidney injury Improved at this time.  Latest creatinine of 0.8.   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  Type 2 diabetes mellitus. -Newly diagnosed.  Hemoglobin A1c of 8.9.  Continue sliding scale and long-acting insulin at this time..  Debility, deconditioning Will need PT OT evaluation.  Nutrition.  Currently on cortrak tube tube nutrition.  Considering for PEG tube placement.  Rectal wound secondary to continuous incontinence and skin breakdown Continue wound care.  Anemia of critical illness Latest hemoglobin of 9.5.  Will transfuse for hemoglobin less than 7.  Acute urinary retention.  Has failed voiding trial.  Will need Foley catheter placement      DVT prophylaxis: SCD's Start: 03/01/22 2003 Place TED hose  Start: 03/01/22 0851 heparin injection 5,000 Units Start: 02/26/22 1400 Place and maintain sequential  compression device Start: 02/15/22 1026   Code Status:     Code Status: Full Code  Disposition: Uncertain at this time  Status is: Inpatient  Remains inpatient appropriate because: Pending clinical improvement, electrolyte imbalance, IV antibiotics,   Family Communication: None at bedside  Consultants:  Infectious disease PCCM Neurosurgery   Procedures:  Intubation and mechanical ventilation Tracheostomy placement Cortrak tube placement decompressive laminectomies on 02/24/2022 and 03/01/2022.  Antimicrobials:  Rocephin IV  Anti-infectives (From admission, onward)    Start     Dose/Rate Route Frequency Ordered Stop   03/02/22 0927  ceFAZolin (ANCEF) IVPB 2g/100 mL premix        2 g 200 mL/hr over 30 Minutes Intravenous Every 8 hours 03/02/22 0927 03/02/22 1820   02/26/22 1300  vancomycin (VANCOCIN) IVPB 1000 mg/200 mL premix  Status:  Discontinued        1,000 mg 200 mL/hr over 60 Minutes Intravenous Every 24 hours 02/25/22 1458 03/01/22 0953   02/25/22 1145  vancomycin (VANCOREADY) IVPB 1500 mg/300 mL        1,500 mg 150 mL/hr over 120 Minutes Intravenous  Once 02/25/22 1051 02/25/22 1507   02/20/22 2200  cefTRIAXone (ROCEPHIN) 2 g in sodium chloride 0.9 % 100 mL IVPB        2 g 200 mL/hr over 30 Minutes Intravenous Every 12 hours 02/20/22 1122     02/19/22 2200  cefTRIAXone (ROCEPHIN) 2 g in sodium chloride 0.9 % 100 mL IVPB  Status:  Discontinued        2 g 200 mL/hr over 30 Minutes Intravenous Every 12 hours 02/19/22 1246 02/19/22 1320   02/19/22 2200  ceFEPIme (MAXIPIME) 2 g in sodium chloride 0.9 % 100 mL IVPB  Status:  Discontinued        2 g 200 mL/hr over 30 Minutes Intravenous Every 12 hours 02/19/22 1320 02/20/22 1122   02/17/22 1030  acyclovir (ZOVIRAX) 550 mg in dextrose 5 % 250 mL IVPB  Status:  Discontinued        550 mg 261 mL/hr over 60 Minutes Intravenous Every 12 hours 02/17/22 0855 02/20/22 0848   02/17/22 1000  ceFAZolin (ANCEF) IVPB 2g/100 mL  premix  Status:  Discontinued        2 g 200 mL/hr over 30 Minutes Intravenous Every 12 hours 02/16/22 1322 02/17/22 0859   02/17/22 1000  vancomycin (VANCOREADY) IVPB 1250 mg/250 mL  Status:  Discontinued        1,250 mg 166.7 mL/hr over 90 Minutes Intravenous Every 24 hours 02/17/22 0836 02/20/22 0927   02/17/22 1000  ceFEPIme (MAXIPIME) 2 g in sodium chloride 0.9 % 100 mL IVPB  Status:  Discontinued        2 g 200 mL/hr over 30 Minutes Intravenous Every 12 hours 02/17/22 0857 02/19/22 1246   02/17/22 0945  ampicillin (OMNIPEN) 2 g in sodium chloride 0.9 % 100 mL IVPB  Status:  Discontinued        2 g 300 mL/hr over 20 Minutes Intravenous Every 6 hours 02/17/22 0859 02/20/22 1122   02/17/22 0915  ceFEPIme (MAXIPIME) 2 g in sodium chloride 0.9 % 100 mL IVPB  Status:  Discontinued        2 g 200 mL/hr over 30 Minutes Intravenous Every 8 hours 02/17/22 0826 02/17/22 0857   02/17/22 0915  ampicillin (OMNIPEN) 2 g in sodium chloride 0.9 % 100 mL  IVPB  Status:  Discontinued        2 g 300 mL/hr over 20 Minutes Intravenous Every 4 hours 02/17/22 0826 02/17/22 0859   02/16/22 1230  cefTRIAXone (ROCEPHIN) 2 g in sodium chloride 0.9 % 100 mL IVPB  Status:  Discontinued        2 g 200 mL/hr over 30 Minutes Intravenous Every 24 hours 02/16/22 0609 02/16/22 1322   02/16/22 0815  vancomycin (VANCOREADY) IVPB 2000 mg/400 mL        2,000 mg 200 mL/hr over 120 Minutes Intravenous  Once 02/16/22 0721 02/16/22 1200   02/15/22 1230  cefTRIAXone (ROCEPHIN) 1 g in sodium chloride 0.9 % 100 mL IVPB  Status:  Discontinued        1 g 200 mL/hr over 30 Minutes Intravenous Every 24 hours 02/14/22 1306 02/16/22 0609   02/15/22 1200  Urelle (URELLE/URISED) 81 MG tablet 81 mg  Status:  Discontinued        1 tablet Oral 3 times daily 02/15/22 1051 02/16/22 1411   02/14/22 1230  cefTRIAXone (ROCEPHIN) 1 g in sodium chloride 0.9 % 100 mL IVPB        1 g 200 mL/hr over 30 Minutes Intravenous  Once 02/14/22 1225  02/14/22 1318        Subjective: Today, patient was seen and examined at bedside.  Patient is on trach collar.  Had fever.  Mention of thick secretions.  Very weak and frail appearing.  Squeezes hands on command.  Objective: Vitals:   03/14/22 0700 03/14/22 0731 03/14/22 0734 03/14/22 1144  BP:  (!) 143/75  132/71  Pulse: (!) 117 (!) 122 (!) 121 (!) 126  Resp: 18 (!) '22 20 18  '$ Temp:  100 F (37.8 C)  100 F (37.8 C)  TempSrc:  Axillary  Oral  SpO2: 96%  96%   Weight:      Height:        Intake/Output Summary (Last 24 hours) at 03/14/2022 1254 Last data filed at 03/14/2022 0900 Gross per 24 hour  Intake 824 ml  Output 3380 ml  Net -2556 ml   Filed Weights   03/10/22 0500 03/12/22 0500 03/14/22 0500  Weight: 89.7 kg 95.1 kg 97.7 kg    Physical Examination: Body mass index is 36.97 kg/m.   General: Obese built, not in obvious distress cortrak tube tube in place weak and deconditioned HENT:   No scleral pallor or icterus noted. Oral mucosa is moist.  Tracheostomy in place. Chest: Coarse breath sounds. CVS: S1 &S2 heard. No murmur.  Regular rate and rhythm. Abdomen: Soft, nontender, nondistended.  Bowel sounds are heard.   Extremities: No cyanosis, clubbing with bilateral lower extremity edema. Psych: Alert, awake and oriented, normal mood CNS: Follow verbal commands, alert and awake, Skin: Warm and dry.  No rashes noted.  Data Reviewed:   CBC: Recent Labs  Lab 03/08/22 0447 03/10/22 0338 03/11/22 0129 03/12/22 0258 03/14/22 0358  WBC 14.0* 13.9* 16.8* 16.7* 11.9*  NEUTROABS 9.1*  --   --   --  7.7  HGB 10.8* 9.5* 8.6* 8.5* 9.5*  HCT 35.6* 31.6* 27.5* 27.1* 31.5*  MCV 96.7 96.0 94.8 93.1 95.7  PLT 297 164 183 194 160    Basic Metabolic Panel: Recent Labs  Lab 03/09/22 0543 03/10/22 0338 03/11/22 0129 03/12/22 0258 03/14/22 0358  NA 153* 146* 138 133* 154*  K 4.6 3.6 3.4* 3.7 4.0  CL 119* 113* 109 104 119*  CO2 21* 22 23  21* 25  GLUCOSE 178*  180* 145* 166* 122*  BUN 41* 36* 21* 16 26*  CREATININE 1.29* 1.07* 0.91 0.77 0.87  CALCIUM 8.7* 7.9* 7.7* 7.9* 8.9    Liver Function Tests: No results for input(s): AST, ALT, ALKPHOS, BILITOT, PROT, ALBUMIN in the last 168 hours.   Radiology Studies: No results found.    LOS: 28 days    Flora Lipps, MD [facility] Available via Epic secure chat 7am-7pm After these hours, please refer to coverage provider listed on [email or web address] 03/14/2022, 12:54 PM

## 2022-03-14 NOTE — Progress Notes (Signed)
NAME:  Ruchi Stoney, MRN:  [id number], DOB:  08-28-1966, LOS: 67 ADMISSION DATE:  02/14/2022, CONSULTATION DATE:  02/16/2022 REFERRING MD:  Tana Coast - [facility] CHIEF COMPLAINT:  Acute encephalopathy and sepsis    History of Present Illness:  56 year old woman initially admitted 5/3 for severe back pain radiating down her leg with associated hematuria/dysuria.  Prolonged hospital course; eventually found to have new AKI with E. Coli urosepsis and bacterial meningitis secondary to multiple epidural abscesses (s/p decompressive laminectomies). Patient was also noted to have acute herniated disc (L3-L4) with L lumbar radiculopathy. Hospital course c/b septic shock, central DI and prolonged intubation (now s/p tracheostomy 5/26).  Pertinent Medical History:  Class II obesity, hepatic steatosis, seasonal allergies, varicose veins diverticulosis, HL   Significant Hospital Events: Including procedures, antibiotic start and stop dates in addition to other pertinent events   5/3 admitted with back pain and urinary tract infection , Further complicated by AKI hyponatremia and multiple metabolic derangements.   CT imaging for renal stones showed no acute abdominal pelvic findings there was no obstructive uropathy there was severe fatty liver disease there was cholelithiasis but no cholecystitis there is colonic diverticulosis but no diverticulitis, mild circumferential thickening of the esophagus globular enlargement of the uterus with radiology recommending nonemergent pelvic ultrasound found to have uterine fibroid abdominal Ultrasound showed fatty liver but was epididymides negative.   MRI of the lumbar spine showed small left subarticular disc extrusion with inferior migration at L3-L4 correlating with radiculopathy.  She was started on ceftriaxone cultures were sent 5/4 increased confusion CT brain obtained raising concern for possible acute infarct in the right basal ganglia 5/5 progressive encephalopathy ,  Worsening leukocytosis, hypernatremia, hyperchloremia, slowly improving renal function, slowly improving procalcitonin, moved to ICU due to delirium, intubated for airway protection and to facilitate MRI imaging right IJ central line placed due to limited IV access.  Seen by neurology, also seen by infectious disease with ceftriaxone changed to cefazolin 5/6 antibiotics changed to meningitis coverage given MRI findings of fluid in the ventricles.  Image guided LP ordered 5/8 LP done with purulent CSF, studies consistent with bacterial meningitis, culture growing gm + cocci 5/10 significant rise in sodium with massive urine output of greater than 7 L leading to concern for development of central DI.  DDAVP and volume resuscitation started 5/11 hypernatremia slowly downtrending, continue DDAVP and IV resuscitation per nephrology 5/12 Sodium downtrended to 147 this am 5/13 to OR for posterior cervical decompressive laminectomy and medial facetectomy C3-T1 for evacuation of epirudal abscess, posterior cervical arthrodesis C3-C7, segmental fixation C3-7 5/18 OR for thoracolumbar laminectomy and decompression of epidural abscess from T-L spine 5/19 extubated to [facility] 5/24 reintubated 5/25 chest tube. MRI with ongoing meningitis + new area of ischemia in central pons 5/26 Tracheostomy created. 5/30 Tolerating TCT x 48H. Ongoing swallow dysfunction, SLP following. Unable to tolerate PMV. Likely will need PEG. WOC assessed rectal wound.  Interim History / Subjective:  Tolerating trach collar trials Ongoing fever Still with persistent thick secretions Transfer to floor 5/30 2023  Objective:  Blood pressure (!) 143/75, pulse (!) 121, temperature 100 F (37.8 C), temperature source Axillary, resp. rate 20, height '5\' 4"'$  (1.626 m), weight 97.7 kg, SpO2 96 %.    FiO2 (%):  [35 %] 35 %   Intake/Output Summary (Last 24 hours) at 03/14/2022 1050 Last data filed at 03/14/2022 0900 Gross per 24 hour  Intake 1124  ml  Output 3380 ml  Net -2256 ml   Filed  Weights   03/10/22 0500 03/12/22 0500 03/14/22 0500  Weight: 89.7 kg 95.1 kg 97.7 kg   Physical Examination: General: Chronically ill-appearing  HEENT: Moist oral mucosa, Shiley #6 cuffed trach in place Neuro: She is awake, responds to verbal stimuli  CV: S1-S2 appreciated PULM: Coarse breath sounds bilaterally GI: Soft, nontender abdomen Extremities: Bilateral symmetric LE edema noted. Skin: Warm/dry, no rashes. Rectal wound as described by WOC (see Media tab).  Resolved medical problems  AKI due to septic ATN, resolved NAGMA - resolved  Anasarca with hypervolemia Shock liver superimposed on known fatty liver disease Septic shock  Assessment & Plan:  Principal Problem:   Abscess in epidural space of cervical spine Active Problems:   Class II obesity   Encephalopathy acute   Acute respiratory failure (HCC)   E. 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 Spinal abscess versus Bacterial meningitis  S/p C3-T1 lami, evac of abscess, thoracolumbar lami/decompression -Infectious disease continues to follow -Neurosurgery continues to follow -On IV ceftriaxone  Shock -On midodrine 20 mg 3 times daily  Acute hypoxic respiratory failure s/p tracheostomy Aspiration pneumonia Incidental finding of right upper lobe 2.2 mm spiculated appearing mass concerning for malignancy Spontaneous right pneumothorax-resolved -Continue trach collar -Continue VAP bundle  Central diabetes insipidus Hyponatremia -DDAVP discontinued -Monitor electrolytes  Acute kidney injury -Monitor electrolytes -Avoid nephrotoxic agents -Ensure adequate  renal perfusion  Incomplete quadriparesis due to spinal cord impact Acute central pontine stroke -Continue aspirin -Continue secondary stroke prophylaxis  Type 2 diabetes -On Lantus 25 units -Continue SSI -Goal CBG 140-180  At risk for malnutrition Deconditioning -Dietitian following -Patient will likely need PEG tube placement -PT/OT/SLP as tolerated  Rectal wound secondary to continuous incontinence and skin breakdown -WOC following  Anemia of critical illness -Trend H&H -Transfusion per protocol  Best Practice (right click and "Reselect all SmartList Selections" daily)   Diet/type: TF DVT prophylaxis: prophylactic heparin  GI prophylaxis: PPI Lines: NA Foley:  Yes, and it is still needed- DI Code Status:  full code Last date of multidisciplinary goals of care discussion: 5/25 Spoke with patient brother and 2 sisters, decision was to proceed with tracheostomy and continue full scope of care  Sherrilyn Rist, MD [place] [facility] Pager: See Shea Evans

## 2022-03-14 NOTE — Progress Notes (Signed)
Inpatient Rehab Admissions Coordinator:    Pt. Continues to be not yet medically ready/not tolerating well enough for CIR consult. We will follow and rescreen if Pt. Appears to demonstrate improved tolerance.   Clemens Catholic, [place], [place] Admissions Coordinator  [phone] ([place]) [phone] (office)

## 2022-03-14 NOTE — Telephone Encounter (Signed)
Per Etta Grandchild, the attending physician, Dr. Jacky Kindle, should complete the FMLA form since patient is still hospitalized.  Jackson Latino will hand carry the form to Dr. Tacy Learn at the hospital.  When Dr. Tacy Learn completes it, it will be returned to me for processing.

## 2022-03-15 DIAGNOSIS — N390 Urinary tract infection, site not specified: Secondary | ICD-10-CM | POA: Diagnosis not present

## 2022-03-15 DIAGNOSIS — R7989 Other specified abnormal findings of blood chemistry: Secondary | ICD-10-CM | POA: Diagnosis not present

## 2022-03-15 DIAGNOSIS — J9601 Acute respiratory failure with hypoxia: Secondary | ICD-10-CM | POA: Diagnosis not present

## 2022-03-15 DIAGNOSIS — N10 Acute tubulo-interstitial nephritis: Secondary | ICD-10-CM | POA: Diagnosis not present

## 2022-03-15 DIAGNOSIS — B9620 Unspecified Escherichia coli [E. coli] as the cause of diseases classified elsewhere: Secondary | ICD-10-CM | POA: Diagnosis not present

## 2022-03-15 DIAGNOSIS — G061 Intraspinal abscess and granuloma: Secondary | ICD-10-CM | POA: Diagnosis not present

## 2022-03-15 LAB — GLUCOSE, CAPILLARY
Glucose-Capillary: 106 mg/dL — ABNORMAL HIGH (ref 70–99)
Glucose-Capillary: 132 mg/dL — ABNORMAL HIGH (ref 70–99)
Glucose-Capillary: 148 mg/dL — ABNORMAL HIGH (ref 70–99)
Glucose-Capillary: 149 mg/dL — ABNORMAL HIGH (ref 70–99)
Glucose-Capillary: 163 mg/dL — ABNORMAL HIGH (ref 70–99)
Glucose-Capillary: 181 mg/dL — ABNORMAL HIGH (ref 70–99)

## 2022-03-15 LAB — COMPREHENSIVE METABOLIC PANEL
ALT: 15 U/L (ref 0–44)
AST: 23 U/L (ref 15–41)
Albumin: 2.4 g/dL — ABNORMAL LOW (ref 3.5–5.0)
Alkaline Phosphatase: 117 U/L (ref 38–126)
Anion gap: 9 (ref 5–15)
BUN: 38 mg/dL — ABNORMAL HIGH (ref 6–20)
CO2: 24 mmol/L (ref 22–32)
Calcium: 8.8 mg/dL — ABNORMAL LOW (ref 8.9–10.3)
Chloride: 124 mmol/L — ABNORMAL HIGH (ref 98–111)
Creatinine, Ser: 1.05 mg/dL — ABNORMAL HIGH (ref 0.44–1.00)
GFR, Estimated: >60 mL/min (ref >60)
Glucose, Bld: 130 mg/dL — ABNORMAL HIGH (ref 70–99)
Potassium: 4 mmol/L (ref 3.5–5.1)
Sodium: 157 mmol/L — ABNORMAL HIGH (ref 135–145)
Total Bilirubin: 0.6 mg/dL (ref 0.3–1.2)
Total Protein: 7.6 g/dL (ref 6.5–8.1)

## 2022-03-15 LAB — CBC
HCT: 32.7 % — ABNORMAL LOW (ref 36.0–46.0)
Hemoglobin: 9.9 g/dL — ABNORMAL LOW (ref 12.0–15.0)
MCH: 29.6 pg (ref 26.0–34.0)
MCHC: 30.3 g/dL (ref 30.0–36.0)
MCV: 97.9 fL (ref 80.0–100.0)
Platelets: 249 10*3/uL (ref 150–400)
RBC: 3.34 MIL/uL — ABNORMAL LOW (ref 3.87–5.11)
RDW: 19.5 % — ABNORMAL HIGH (ref 11.5–15.5)
WBC: 13.1 10*3/uL — ABNORMAL HIGH (ref 4.0–10.5)
nRBC: 0.6 % — ABNORMAL HIGH (ref 0.0–0.2)

## 2022-03-15 LAB — TRIGLYCERIDES: Triglycerides: 378 mg/dL — ABNORMAL HIGH (ref <150)

## 2022-03-15 LAB — MAGNESIUM: Magnesium: 2.4 mg/dL (ref 1.7–2.4)

## 2022-03-15 LAB — PROTIME-INR
INR: 1.2 (ref 0.8–1.2)
Prothrombin Time: 15.2 seconds (ref 11.4–15.2)

## 2022-03-15 MED ORDER — FREE WATER
150.0000 mL | Status: DC
  Administered 2022-03-15 – 2022-03-19 (×42): 150 mL

## 2022-03-15 MED ORDER — FREE WATER
100.0000 mL | Status: DC
Start: 2022-03-15 — End: 2022-03-15
  Administered 2022-03-15 (×4): 100 mL

## 2022-03-15 NOTE — Progress Notes (Addendum)
PROGRESS NOTE    Gabrielle Vincent  CNO:[id number] DOB: Aug 06, 1966 DOA: 02/14/2022 PCP: Merryl Hacker, No    Brief Narrative:  Gabrielle Vincent is a 56 y.o. female with history of allergies, obesity presented to hospital with complaint of severe low back pain radiating to her  lower extremity with hematuria and dysuria.  In the ED, patient was noted to be hyponatremic and hypokalemic with elevated anion gap of 19 and creatinine at 3.6.  She had leukocytosis with WBC at 21.3.  CT renal study did not show obstructive uropathy but hepatomegaly cholecystolithiasis.  Patient was then admitted hospital for acute or severe lower back pain with radiculopathy, acute kidney injury, UTI, elevated LFTs, electrolyte imbalances.    During hospitalization, patient had worsening confusion and encephalopathy and had difficulty with airway protection.  Patient was empirically started on antibiotics for meningitis and imaging guided lumbar puncture was performed which showed a purulent CSF consistent with bacterial meningitis.  MRI of the lumbar spine was then ordered and was noted to have multiple epidural abscesses and underwent decompressive laminectomies on 02/24/2022 and 03/01/2022.[name]  Patient also was noted to have acute herniated disc L3-L4 with lumbar radiculopathy.    Patient was initially extubated on 03/02/22 but on 03/07/2022 had to be reintubated. Hospital course was complicated by septic shock, central diabetes insipidus and recent prolonged intubation which required tracheostomy on 03/09/2022.  Patient had tolerated the trach collar for 48 hours and was transferred out of the ICU.  Assessment and plan  Principal Problem:   Abscess in epidural space of cervical spine Active Problems:   Class II obesity   Encephalopathy acute   Acute respiratory failure (HCC)   E. coli UTI   Bacterial meningitis   Diabetes insipidus ([place])   H/O excision of lamina of cervical vertebra for decompression of spinal cord   E coli  bacteremia   Pressure injury of skin   Acute metabolic encephalopathy Secondary to sepsis and infection.  Follows few commands status post tracheostomy.  Continue supportive care. E. coli complicated UTI with extensive epidural spinal abscess/Bacterial meningitis  Status post C3-T1 laminectomy, evacuation of abscess, thoracolumbar laminectomy/decompression by neurosurgery.  Currently being followed by neurosurgery and infectious disease.  On IV Rocephin IV twice daily..  Continues to have leukocytosis and high-grade fever.  Will likely need to repeat MRI of the spine in AM.  Communicated with infectious disease.  Blood culture from 5/31 negative till date.   Shock Improved at this time.  On midodrine 20 mg 3 times daily.  Latest WBC at 13.1 from 11.9.   Acute hypoxic respiratory failure s/p tracheostomy Aspiration pneumonia Incidental finding of right upper lobe 2.2 mm spiculated appearing mass concerning for malignancy Spontaneous right pneumothorax-resolved Continue trach collar.  Being followed by PCCM.  Follow PCCM recommendations.   Central diabetes insipidus Hyponatremia Patient received DDAVP during hospitalization.  This has been discontinued at this time.  Latest sodium of 157 which is higher than 154 yesterday.  We will increase her free water flushes to every 2 hour..  Acute kidney injury Improved at this time.  Latest creatinine of 1.0.   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  Type 2 diabetes mellitus. -Newly diagnosed.  Hemoglobin A1c of 8.9.  Continue sliding scale and long-acting insulin at this time.  Latest POC glucose of 106.  Nutrition.  Currently on cortrak tube tube nutrition.  IR has been consulted for PEG tube placement but due to  ongoing fevers on hold.  Patient will need to have aspirin on hold prior to intervention.  We will continue cortrak tube tube feeding in the  meantime.  Rectal wound secondary to continuous incontinence and skin breakdown Continue wound care.  Anemia of critical illness Latest hemoglobin of 9.9.  Will transfuse for hemoglobin less than 7.  Acute urinary retention.  Has failed voiding trial.  Continue Foley catheter  Pressure injury.  We will continue wound care. Pressure Injury 03/06/22 Anus Lower;Mid Deep Tissue Pressure Injury - Purple or maroon localized area of discolored intact skin or blood-filled blister due to damage of underlying soft tissue from pressure and/or shear. raised purple area at base of rect (Active)  03/06/22 1730  Location: Anus  Location Orientation: Lower;Mid  Staging: Deep Tissue Pressure Injury - Purple or maroon localized area of discolored intact skin or blood-filled blister due to damage of underlying soft tissue from pressure and/or shear.  Wound Description (Comments): raised purple area at base of rectum  Present on Admission: No     Pressure Injury 03/13/22 Heel Right Stage 1 -  Intact skin with non-blanchable redness of a localized area usually over a bony prominence. (Active)  03/13/22 0948  Location: Heel  Location Orientation: Right  Staging: Stage 1 -  Intact skin with non-blanchable redness of a localized area usually over a bony prominence.  Wound Description (Comments):   Present on Admission: No     Pressure Injury 03/13/22 Heel Left Deep Tissue Pressure Injury - Purple or maroon localized area of discolored intact skin or blood-filled blister due to damage of underlying soft tissue from pressure and/or shear. (Active)  03/13/22 0948  Location: Heel  Location Orientation: Left  Staging: Deep Tissue Pressure Injury - Purple or maroon localized area of discolored intact skin or blood-filled blister due to damage of underlying soft tissue from pressure and/or shear.  Wound Description (Comments):   Present on Admission: No   Extreme debility, deconditioning Will need PT OT  evaluation due to fever tachycardia on hold at this time.  Will likely need placement.       DVT prophylaxis: SCD's Start: 03/01/22 2003 Place TED hose Start: 03/01/22 0851 heparin injection 5,000 Units Start: 02/26/22 1400 Place and maintain sequential compression device Start: 02/15/22 1026   Code Status:     Code Status: Full Code  Disposition: Uncertain at this time  Status is: Inpatient  Remains inpatient appropriate because: Pending clinical improvement, IV antibiotics, need for further imaging and work-up, plan for PEG tube placement in the future.   Family Communication:  I spoke with the patient's brother  and daughter on the phone and updated them about the clinical condition of the patient.   Consultants:  Infectious disease PCCM Neurosurgery  Procedures:  Intubation and mechanical ventilation Tracheostomy placement Cortrak tube placement Decompressive laminectomies on 02/24/2022 and 03/01/2022.  Antimicrobials:  Rocephin IV  Anti-infectives (From admission, onward)    Start     Dose/Rate Route Frequency Ordered Stop   03/02/22 0927  ceFAZolin (ANCEF) IVPB 2g/100 mL premix        2 g 200 mL/hr over 30 Minutes Intravenous Every 8 hours 03/02/22 0927 03/02/22 1820   02/26/22 1300  vancomycin (VANCOCIN) IVPB 1000 mg/200 mL premix  Status:  Discontinued        1,000 mg 200 mL/hr over 60 Minutes Intravenous Every 24 hours 02/25/22 1458 03/01/22 0953   02/25/22 1145  vancomycin (VANCOREADY) IVPB 1500 mg/300 mL  1,500 mg 150 mL/hr over 120 Minutes Intravenous  Once 02/25/22 1051 02/25/22 1507   02/20/22 2200  cefTRIAXone (ROCEPHIN) 2 g in sodium chloride 0.9 % 100 mL IVPB        2 g 200 mL/hr over 30 Minutes Intravenous Every 12 hours 02/20/22 1122     02/19/22 2200  cefTRIAXone (ROCEPHIN) 2 g in sodium chloride 0.9 % 100 mL IVPB  Status:  Discontinued        2 g 200 mL/hr over 30 Minutes Intravenous Every 12 hours 02/19/22 1246 02/19/22 1320   02/19/22  2200  ceFEPIme (MAXIPIME) 2 g in sodium chloride 0.9 % 100 mL IVPB  Status:  Discontinued        2 g 200 mL/hr over 30 Minutes Intravenous Every 12 hours 02/19/22 1320 02/20/22 1122   02/17/22 1030  acyclovir (ZOVIRAX) 550 mg in dextrose 5 % 250 mL IVPB  Status:  Discontinued        550 mg 261 mL/hr over 60 Minutes Intravenous Every 12 hours 02/17/22 0855 02/20/22 0848   02/17/22 1000  ceFAZolin (ANCEF) IVPB 2g/100 mL premix  Status:  Discontinued        2 g 200 mL/hr over 30 Minutes Intravenous Every 12 hours 02/16/22 1322 02/17/22 0859   02/17/22 1000  vancomycin (VANCOREADY) IVPB 1250 mg/250 mL  Status:  Discontinued        1,250 mg 166.7 mL/hr over 90 Minutes Intravenous Every 24 hours 02/17/22 0836 02/20/22 0927   02/17/22 1000  ceFEPIme (MAXIPIME) 2 g in sodium chloride 0.9 % 100 mL IVPB  Status:  Discontinued        2 g 200 mL/hr over 30 Minutes Intravenous Every 12 hours 02/17/22 0857 02/19/22 1246   02/17/22 0945  ampicillin (OMNIPEN) 2 g in sodium chloride 0.9 % 100 mL IVPB  Status:  Discontinued        2 g 300 mL/hr over 20 Minutes Intravenous Every 6 hours 02/17/22 0859 02/20/22 1122   02/17/22 0915  ceFEPIme (MAXIPIME) 2 g in sodium chloride 0.9 % 100 mL IVPB  Status:  Discontinued        2 g 200 mL/hr over 30 Minutes Intravenous Every 8 hours 02/17/22 0826 02/17/22 0857   02/17/22 0915  ampicillin (OMNIPEN) 2 g in sodium chloride 0.9 % 100 mL IVPB  Status:  Discontinued        2 g 300 mL/hr over 20 Minutes Intravenous Every 4 hours 02/17/22 0826 02/17/22 0859   02/16/22 1230  cefTRIAXone (ROCEPHIN) 2 g in sodium chloride 0.9 % 100 mL IVPB  Status:  Discontinued        2 g 200 mL/hr over 30 Minutes Intravenous Every 24 hours 02/16/22 0609 02/16/22 1322   02/16/22 0815  vancomycin (VANCOREADY) IVPB 2000 mg/400 mL        2,000 mg 200 mL/hr over 120 Minutes Intravenous  Once 02/16/22 0721 02/16/22 1200   02/15/22 1230  cefTRIAXone (ROCEPHIN) 1 g in sodium chloride 0.9 % 100 mL  IVPB  Status:  Discontinued        1 g 200 mL/hr over 30 Minutes Intravenous Every 24 hours 02/14/22 1306 02/16/22 0609   02/15/22 1200  Urelle (URELLE/URISED) 81 MG tablet 81 mg  Status:  Discontinued        1 tablet Oral 3 times daily 02/15/22 1051 02/16/22 1411   02/14/22 1230  cefTRIAXone (ROCEPHIN) 1 g in sodium chloride 0.9 % 100 mL IVPB  1 g 200 mL/hr over 30 Minutes Intravenous  Once 02/14/22 1225 02/14/22 1318      Subjective:  Today, patient was seen and examined at bedside.  Nursing staff reported fever with tachycardia.  No mention of nausea vomiting or increasing shortness of breath.    Objective: Vitals:   03/15/22 0732 03/15/22 0851 03/15/22 1003 03/15/22 1142  BP:  129/74  126/68  Pulse: (!) 131 (!) 134  (!) 141  Resp: (!) 26 (!) 26  (!) 25  Temp:  (!) 101.2 F (38.4 C) 99.2 F (37.3 C) 98.9 F (37.2 C)  TempSrc:  Oral Oral Oral  SpO2: 97% 98%  95%  Weight:      Height:        Intake/Output Summary (Last 24 hours) at 03/15/2022 1203 Last data filed at 03/15/2022 0935 Gross per 24 hour  Intake 1350 ml  Output 3550 ml  Net -2200 ml   Filed Weights   03/12/22 0500 03/14/22 0500 03/15/22 0500  Weight: 95.1 kg 97.7 kg 95.1 kg    Physical Examination: Body mass index is 35.99 kg/m.   General: Obese built, not in obvious distress, appears weak and deconditioned and feeble, cortrak tube tube in place HENT:   No scleral pallor or icterus noted. Oral mucosa is moist.  Tracheostomy in place Chest:    Diminished breath sounds bilaterally.  Coarse breath sounds noted. CVS: S1 &S2 heard. No murmur.  Regular rate and rhythm.  Tachycardia noted. Abdomen: Soft, nontender, nondistended.  Bowel sounds are heard.   Extremities: No cyanosis, clubbing or extremity edema.   Psych: Alert, awake but very weak and deconditioned, follows verbal commands. CNS:  No cranial nerve deficits.  Weakness of all the extremities.  Unable to move the lower extremities, able to move  left upper arm slightly. Skin: Warm and dry.  Data Reviewed:   CBC: Recent Labs  Lab 03/10/22 0338 03/11/22 0129 03/12/22 0258 03/14/22 0358 03/15/22 0302  WBC 13.9* 16.8* 16.7* 11.9* 13.1*  NEUTROABS  --   --   --  7.7  --   HGB 9.5* 8.6* 8.5* 9.5* 9.9*  HCT 31.6* 27.5* 27.1* 31.5* 32.7*  MCV 96.0 94.8 93.1 95.7 97.9  PLT 164 183 194 236 630    Basic Metabolic Panel: Recent Labs  Lab 03/10/22 0338 03/11/22 0129 03/12/22 0258 03/14/22 0358 03/15/22 0302  NA 146* 138 133* 154* 157*  K 3.6 3.4* 3.7 4.0 4.0  CL 113* 109 104 119* 124*  CO2 22 23 21* 25 24  GLUCOSE 180* 145* 166* 122* 130*  BUN 36* 21* 16 26* 38*  CREATININE 1.07* 0.91 0.77 0.87 1.05*  CALCIUM 7.9* 7.7* 7.9* 8.9 8.8*  MG  --   --   --   --  2.4    Liver Function Tests: Recent Labs  Lab 03/15/22 0302  AST 23  ALT 15  ALKPHOS 117  BILITOT 0.6  PROT 7.6  ALBUMIN 2.4*     Radiology Studies: CT ABDOMEN WO CONTRAST  Result Date: 03/14/2022 CLINICAL DATA:  Encephalopathy, meningitis and respiratory failure. Need for percutaneous gastrostomy tube for nutrition. Evaluation anatomy prior to potential attempted percutaneous gastrostomy tube placement. EXAM: CT ABDOMEN WITHOUT CONTRAST TECHNIQUE: Multidetector CT imaging of the abdomen was performed following the standard protocol without IV contrast. RADIATION DOSE REDUCTION: This exam was performed according to the departmental dose-optimization program which includes automated exposure control, adjustment of the mA and/or kV according to patient size and/or use of iterative reconstruction  technique. COMPARISON:  Prior CT of the abdomen and pelvis on 02/14/2022 FINDINGS: Lower chest: Minimal bibasilar atelectasis. Hepatobiliary: Gallstones again visible in the gallbladder lumen. No biliary ductal dilatation. No hepatic lesions identified by unenhanced CT. Pancreas: Unremarkable. No pancreatic ductal dilatation or surrounding inflammatory changes. Spleen:  Normal in size without focal abnormality. Adrenals/Urinary Tract: Adrenal glands are unremarkable. Kidneys are normal, without renal calculi, focal lesion, or hydronephrosis. Stomach/Bowel: No hiatal hernia. A feeding tube extends to the region of the distal gastric antrum. The stomach is normally positioned. No interposition of the colon between the stomach and abdominal wall. The left lobe of the liver is interposed between the proximal stomach and the abdominal wall. The body and distal stomach lie immediately deep to the abdominal wall. No evidence of bowel obstruction, ileus or free intraperitoneal air. Vascular/Lymphatic: Atherosclerosis of the abdominal aorta without evidence of aneurysm. No enlarged lymph nodes identified. Other: No abdominal wall hernia. No ascites or abnormal fluid collections. Musculoskeletal: No acute or significant osseous findings. IMPRESSION: 1. Normal gastric positioning. No anatomic contraindication to attempted percutaneous gastrostomy tube placement. 2. Current feeding tube extends to the region of the distal gastric antrum. 3. Atherosclerosis of the abdominal aorta without aneurysm. 4. Cholelithiasis. Electronically Signed   By: Aletta Edouard M.D.   On: 03/14/2022 14:32      LOS: 29 days    Flora Lipps, MD [facility] Available via Epic secure chat 7am-7pm After these hours, please refer to coverage provider listed on [email or web address] 03/15/2022, 12:03 PM

## 2022-03-15 NOTE — Progress Notes (Signed)
Speech Language Pathology Treatment: Gabrielle Vincent Speaking valve  Patient Details Name: Gabrielle Vincent MRN: [id number] DOB: 14-Sep-1966 Today's Date: 03/15/2022 Time: 6606-3016 SLP Time Calculation (min) (ACUTE ONLY): 8 min  Assessment / Plan / Recommendation Clinical Impression  Pt was lethargic during attempts at [facility] trials this afternoon, but with seemingly less coughing than when seen by last SLP. She did have coughing elicited with placement x1, with coughing not productive of secretions. During attempts at [facility] placement, pt was not able to produce any voicing though. Question the impact of lethargy on effort, with inconsistent attempts noted; however, note that she has also been aphonic in previous sessions. Would continue PMV with SLP only. Would consider changing at least to cuffless trach to try to facilitate PMV use.    HPI HPI: 56 y.o. female presents 02/14/2022 with severe back pain associated with hematuria and dysuria. Found to have UTI and  AKI. MRI demonstrated small herniated nucleus pulposus at L3-L4. 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 Cultures positive for meningitis. MRI spine demonstrates diffuse epidural abscess with cord compression and likely signal change. Pt underwent C3-T1 decompressive laminectomy with epidural abscess evaucation along with C3-7 fusion on 5/13. MRI 5/16 extensive thoracic epidural abscess. 5/18- Pt underwent thoracolumbar laminectomy and decompression of epidural abscess. Extubated 5/19. Swallow evaluated 5/21 with recs for NPO; high aspiration risk. Reintubated 5/25; 5/25 repeat MRI with ongoing meningitis + new area of ischemia in central pons. Trach 5/26.  PMH includes obesity, HLD, diverticulosis.      SLP Plan  Continue with current plan of care      Recommendations for follow up therapy are one component of a multi-disciplinary discharge planning process, led  by the attending physician.  Recommendations may be updated based on patient status, additional functional criteria and insurance authorization.    Recommendations  Diet recommendations: NPO Medication Administration: Via alternative means      Patient may use Passy-Muir Speech Valve: with SLP only MD: Please consider changing trach tube to : Cuffless;Smaller size         Oral Care Recommendations: Oral care QID Follow Up Recommendations: Acute inpatient rehab (3hours/day) Assistance recommended at discharge: Frequent or constant Supervision/Assistance SLP Visit Diagnosis: Aphonia (R49.1) Plan: Continue with current plan of care           [name], M.A. [place] Office [phone]  Secure chat preferred   03/15/2022, 2:44 PM

## 2022-03-15 NOTE — Progress Notes (Signed)
Patient ID: Gabrielle Vincent, female   DOB: 03-26-1966, 56 y.o.   MRN: [id number] Fevers persisting and wbc increased again... Agree that mri with Gd of c spine tspine and Lspine should be performed/

## 2022-03-15 NOTE — Progress Notes (Signed)
Interventional Radiology Brief Note:  IR consulted for G-tube however patient febrile, tachycardic, with elevated WBC.  Recently placed on aspirin which will need to be held for 5 days prior to G-tube placement.  Discussed with primary MD re: timing of procedure-- hold aspirin now and re-evaluate for possible placement in 5 days, or wait until she is more stable then hold aspirin.  MD to investigate.  IR following.  Currently has NGT in place.   Brynda Greathouse, MS RD PA-C

## 2022-03-15 NOTE — Progress Notes (Signed)
Physical Therapy Treatment Patient Details Name: Gabrielle Vincent MRN: [id number] DOB: 1965-10-29 Today's Date: 03/15/2022   History of Present Illness 56 y.o. female presents to [facility] hospital on 02/14/2022 with complaints of severe back pain associated with hematuria and dysuria. Pt admitted for management of UTI and AKI. MRI demonstrates small herniated nucleus pulposus at L3-L4 on the left side. 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 Pt intubated 02/16/2022. Cultures positive for meningitis. MRI 02/23/2022 demonstrates diffuse epidural abscess with cord compression and likely signal change. Pt underwent C3-T1 decompressive laminectomy with epidural abscess evaucation along with C3-7 fusion on 5/13. MRI 5/16 demonstrates Extensive thoracic epidural abscess. 5/18- Pt underwent Thoracolumbar laminectomy and decompression of epidural abscess. Extubated 5/19. 5/24 reintubated, 5/25 chest tube placement and MRI with new area of ischemia in central pons. Trachostomy on 5/26. PMH includes obesity, HLD, diverticulosis.    PT Comments    Pt limited in mobility by increased lethargy, decreased command follow and increased weakness. PT/OT utilized bed functions to be able to bring pt to longsitting in bed for ROM and self care tasks. With adjustment of hips into neutral position pt able to progress to ~1 min bout of static longsitting with only min guard. RN notified of pts increased skin warmth, increased lethargy and need for air mattress due to pt lack of ability to weightshift and anal wound.  D/c plan remains appropriate at this time. PT will continue to follow acutely.  Recommendations for follow up therapy are one component of a multi-disciplinary discharge planning process, led by the attending physician.  Recommendations may be updated based on patient status, additional functional criteria and insurance authorization.  Follow Up Recommendations  Skilled nursing-short term  rehab (<3 hours/day)     Assistance Recommended at Discharge Frequent or constant Supervision/Assistance  Patient can return home with the following Two people to help with walking and/or transfers;Two people to help with bathing/dressing/bathroom;Assistance with cooking/housework;Assistance with feeding;Assist for transportation;Help with stairs or ramp for entrance;Direct supervision/assist for medications management;Direct supervision/assist for financial management   Equipment Recommendations  Wheelchair (measurements PT);Wheelchair cushion (measurements PT);Hospital bed;Other (comment)    Recommendations for Other Services       Precautions / Restrictions Precautions Precautions: Fall;Back Precaution Booklet Issued: No Required Braces or Orthoses: Other Brace Other Brace: no brace needed per orders Restrictions Weight Bearing Restrictions: No     Mobility  Bed Mobility               General bed mobility comments: Use of bed to position into upright posture "chair position". Then providing support into long sitting.    Transfers                   General transfer comment: Defered due to safety        Balance Overall balance assessment: Needs assistance Sitting-balance support: No upper extremity supported (long sitting) Sitting balance-Leahy Scale: Poor Sitting balance - Comments: Initially requiring Max A for upright posture long sitting in bed. However, repositioning for decreasing posterior tilt of pelvis. Pt then able to achieve sitting balance with Min Guard A for 1 min. Alternating between [place] and MIn A per fatgiue                                    Cognition Arousal/Alertness: Lethargic Behavior During Therapy: Flat affect Overall Cognitive Status: Difficult to assess  General Comments: Pt with more lethargy this session. Difficult to arouse at first and then alternating between  open/closed eyes. More awake by end of session. Poor attention and difficulty both following one step commands as well as maintaining sustain visual attention. Looking at therapist with name called but then rapidly looking around room.        Exercises Other Exercises Other Exercises: Long sitting in bed - focusing on righting reactions and correcting lateral leans. Other Exercises: Use of mirror for attention; cues to locate self in mirror and maintain attention    General Comments General comments (skin integrity, edema, etc.): HR 130s. SpO2 90s on trach collar with 8L and FiO2 35%. Pt very warm to touch      Pertinent Vitals/Pain Pain Assessment Pain Assessment: Faces Faces Pain Scale: Hurts a little bit Pain Location: general discomfort with coughing Pain Descriptors / Indicators: Grimacing Pain Intervention(s): Limited activity within patient's tolerance, Monitored during session, Repositioned     PT Goals (current goals can now be found in the care plan section) Acute Rehab PT Goals PT Goal Formulation: With patient Time For Goal Achievement: 03/29/22 Potential to Achieve Goals: Fair Progress towards PT goals: Not progressing toward goals - comment (limited by lethargy)    Frequency    Min 3X/week      PT Plan Current plan remains appropriate    Co-evaluation PT/OT/SLP Co-Evaluation/Treatment: Yes Reason for Co-Treatment: To address functional/ADL transfers PT goals addressed during session: Mobility/safety with mobility OT goals addressed during session: ADL's and self-care      AM-PAC PT "6 Clicks" Mobility   Outcome Measure  Help needed turning from your back to your side while in a flat bed without using bedrails?: Total Help needed moving from lying on your back to sitting on the side of a flat bed without using bedrails?: Total Help needed moving to and from a bed to a chair (including a wheelchair)?: Total Help needed standing up from a chair using  your arms (e.g., wheelchair or bedside chair)?: Total Help needed to walk in hospital room?: Total Help needed climbing 3-5 steps with a railing? : Total 6 Click Score: 6    End of Session Equipment Utilized During Treatment: Oxygen Activity Tolerance: Patient limited by lethargy Patient left: in bed;with call bell/phone within reach;with bed alarm set Nurse Communication: Mobility status;Other (comment) (need for air mattress due to decreased ability to weightshift, and pt very warm to touch) PT Visit Diagnosis: Other abnormalities of gait and mobility (R26.89);Other symptoms and signs involving the nervous system (R29.898);Pain Pain - part of body:  (back, LE)     Time: 5993-5701 PT Time Calculation (min) (ACUTE ONLY): 79 min  Charges:  $Therapeutic Activity: 23-37 mins                     Effie Janoski B. Migdalia Dk PT, DPT Acute Rehabilitation Services Please use secure chat or  Call Office [phone]    [place] 03/15/2022, 4:28 PM

## 2022-03-15 NOTE — Progress Notes (Signed)
Patient seen today by trach team for consult.  No education is needed at this time.  All necessary equipment is at beside.   Will continue to follow for progression.  

## 2022-03-15 NOTE — Progress Notes (Signed)
[facility]  Date of Admission:  02/14/2022           Reason for visit: Follow up on E coli epidural abscess   Current antibiotics: Ceftriaxone   ASSESSMENT:    56 y.o. female admitted with:  E coli complicated UTI with extensive epidural abscess extending from lumbar to cervical spine with associated meningitis/encephalitis: Status post cervical decompression and instrumentation C3-7 on 5/13 and thoracolumbar laminectomy and decompression epidural abscess on 5/18.  Most recent MRI T and C-spine 5/23 with enhancement in the disc space and facets around C5-6 and posterior meningeal enhancement concerning for persistent infection as well as epidural collection seen from T11 extending into the lumbar spine (measuring 50m, previously 976m.  NSGY is continuing to follow.  Newly diagnosed DM: A1c is 8.9. Diabetes insipidus: Na today is 157. Pressure injury in setting of large rectal ulceration from fecal management system: Receiving wound care and trying to keep area clean.  Right upper lobe 2.2cm spiculating mass: Concerning for malignancy.  CT chest obtained 5/24 with 2.3cm RUL nodule but not diagnostic, however, per CCM appears likely malignancy. Hypoxic respiratory failure status post tracheostomy: Currently on trach collar and transferred to floor 5/30. Urinary retention: In setting of spinal cord injury.  Foley removed 5/30 and failed void trial so replaced 5/31.   RECOMMENDATIONS:    Continue ceftriaxone BID Follow cultures from 5/31 -- NGTD Appreciate NSGY assistance Consider repeat MRI tomorrow if continued persistent fevers Lab monitoring Wound care Glycemic control Will follow   Principal Problem:   Abscess in epidural space of cervical spine Active Problems:   Class II obesity   Encephalopathy acute   Acute respiratory failure (HCC)   E. coli UTI   Bacterial meningitis   [place]  H/O excision of lamina of cervical  vertebra for decompression of spinal cord   E coli bacteremia   Pressure injury of skin    MEDICATIONS:    Scheduled Meds:  aspirin  81 mg Per Tube Daily   chlorhexidine gluconate (MEDLINE KIT)  15 mL Mouth Rinse BID   Chlorhexidine Gluconate Cloth  6 each Topical Daily   feeding supplement (JEVITY 1.5 CAL/FIBER)  237 mL Per Tube TID WC   feeding supplement (JEVITY 1.5 CAL/FIBER)  474 mL Per Tube Q24H   feeding supplement (PROSource TF)  90 mL Per Tube BID   free water  100 mL Per Tube Q2H   Gerhardt's butt cream   Topical BID   heparin injection (subcutaneous)  5,000 Units Subcutaneous Q8H   insulin aspart  0-15 Units Subcutaneous Q4H   insulin aspart  8 Units Subcutaneous Q4H   insulin glargine-yfgn  25 Units Subcutaneous BID   mouth rinse  15 mL Mouth Rinse 10 times per day   midodrine  20 mg Per Tube Q8H   nutrition supplement (JUVEN)  1 packet Per Tube BID BM   pantoprazole sodium  40 mg Per Tube Q1400   Continuous Infusions:  cefTRIAXone (ROCEPHIN)  IV 2 g (03/14/22 2334)   PRN Meds:.acetaminophen (TYLENOL) oral liquid 160 mg/5 mL **OR** acetaminophen, bisacodyl, docusate, fentaNYL, fentaNYL (SUBLIMAZE) injection, ipratropium-albuterol, lip balm, menthol-cetylpyridinium **OR** phenol, [DISCONTINUED] ondansetron **OR** ondansetron (ZOFRAN) IV, ondansetron **OR** [DISCONTINUED] ondansetron (ZOFRAN) IV, oxyCODONE, polyethylene glycol, senna, Zinc Oxide  SUBJECTIVE:   24 hour events:  Febrile, Tmax 102.2 WBC 13.1 Creatinine 1.05, Na 157 5/31 blood cx NGTD Tachycardic, tachypneic On Trach collar  Review of Systems  Unable to perform ROS: Medical condition     OBJECTIVE:   Blood pressure 128/74, pulse (!) 145, temperature (!) 101.6 F (38.7 C), temperature source Oral, resp. rate (!) 30, height 5' 4"  (1.626 m), weight 95.1 kg, SpO2 97 %. Body mass index is 35.99 kg/m.  Physical Exam Constitutional:      Appearance: She is ill-appearing.  HENT:     Head:  Normocephalic and atraumatic.  Eyes:     Extraocular Movements: Extraocular movements intact.     Conjunctiva/sclera: Conjunctivae normal.  Neck:     Comments: Trach collar. Cardiovascular:     Rate and Rhythm: Tachycardia present.  Pulmonary:     Effort: Pulmonary effort is normal. No respiratory distress.  Abdominal:     General: There is no distension.     Palpations: Abdomen is soft.     Tenderness: There is no abdominal tenderness.  Musculoskeletal:     Comments: She is unable to move lower extremities. She is able to lift her left arm slightly and following commands.  Skin:    General: Skin is warm and dry.  Neurological:     General: No focal deficit present.     Mental Status: She is alert. Mental status is at baseline.  Psychiatric:        Mood and Affect: Mood normal.        Behavior: Behavior normal.     Lab Results: Lab Results  Component Value Date   WBC 13.1 (H) 03/15/2022   HGB 9.9 (L) 03/15/2022   HCT 32.7 (L) 03/15/2022   MCV 97.9 03/15/2022   PLT 249 03/15/2022    Lab Results  Component Value Date   NA 157 (H) 03/15/2022   K 4.0 03/15/2022   CO2 24 03/15/2022   GLUCOSE 130 (H) 03/15/2022   BUN 38 (H) 03/15/2022   CREATININE 1.05 (H) 03/15/2022   CALCIUM 8.8 (L) 03/15/2022   GFRNONAA >60 03/15/2022    Lab Results  Component Value Date   ALT 15 03/15/2022   AST 23 03/15/2022   ALKPHOS 117 03/15/2022   BILITOT 0.6 03/15/2022    No results found for: CRP  No results found for: ESRSEDRATE   I have reviewed the micro and lab results in Epic.  Imaging: CT ABDOMEN WO CONTRAST  Result Date: 03/14/2022 CLINICAL DATA:  Encephalopathy, meningitis and respiratory failure. Need for percutaneous gastrostomy tube for nutrition. Evaluation anatomy prior to potential attempted percutaneous gastrostomy tube placement. EXAM: CT ABDOMEN WITHOUT CONTRAST TECHNIQUE: Multidetector CT imaging of the abdomen was performed following the standard protocol  without IV contrast. RADIATION DOSE REDUCTION: This exam was performed according to the departmental dose-optimization program which includes automated exposure control, adjustment of the mA and/or kV according to patient size and/or use of iterative reconstruction technique. COMPARISON:  Prior CT of the abdomen and pelvis on 02/14/2022 FINDINGS: Lower chest: Minimal bibasilar atelectasis. Hepatobiliary: Gallstones again visible in the gallbladder lumen. No biliary ductal dilatation. No hepatic lesions identified by unenhanced CT. Pancreas: Unremarkable. No pancreatic ductal dilatation or surrounding inflammatory changes. Spleen: Normal in size without focal abnormality. Adrenals/Urinary Tract: Adrenal glands are unremarkable. Kidneys are normal, without renal calculi, focal lesion, or hydronephrosis. Stomach/Bowel: No hiatal hernia. A feeding tube extends to the region of the distal gastric antrum. The stomach is normally positioned. No interposition of the colon between the stomach and abdominal wall. The left lobe of the liver is interposed between the proximal stomach and the abdominal wall. The body and distal  stomach lie immediately deep to the abdominal wall. No evidence of bowel obstruction, ileus or free intraperitoneal air. Vascular/Lymphatic: Atherosclerosis of the abdominal aorta without evidence of aneurysm. No enlarged lymph nodes identified. Other: No abdominal wall hernia. No ascites or abnormal fluid collections. Musculoskeletal: No acute or significant osseous findings. IMPRESSION: 1. Normal gastric positioning. No anatomic contraindication to attempted percutaneous gastrostomy tube placement. 2. Current feeding tube extends to the region of the distal gastric antrum. 3. Atherosclerosis of the abdominal aorta without aneurysm. 4. Cholelithiasis. Electronically Signed   By: Aletta Edouard M.D.   On: 03/14/2022 14:32     Imaging independently reviewed in Epic.    [facility] [phone] pager 03/15/2022, 8:19 AM

## 2022-03-15 NOTE — Progress Notes (Signed)
Occupational Therapy Treatment Patient Details Name: Gabrielle Vincent MRN: [id number] DOB: Aug 10, 1966 Today's Date: 03/15/2022   History of present illness 56 y.o. female presents to [facility] hospital on 02/14/2022 with complaints of severe back pain associated with hematuria and dysuria. Pt admitted for management of UTI and AKI. MRI demonstrates small herniated nucleus pulposus at L3-L4 on the left side. 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 Pt intubated 02/16/2022. Cultures positive for meningitis. MRI 02/23/2022 demonstrates diffuse epidural abscess with cord compression and likely signal change. Pt underwent C3-T1 decompressive laminectomy with epidural abscess evaucation along with C3-7 fusion on 5/13. MRI 5/16 demonstrates Extensive thoracic epidural abscess. 5/18- Pt underwent Thoracolumbar laminectomy and decompression of epidural abscess. Extubated 5/19. 5/24 reintubated, 5/25 chest tube placement and MRI with new area of ischemia in central pons. Trachostomy on 5/26. PMH includes obesity, HLD, diverticulosis.   OT comments  Pt presenting with session with more lethargy and decreased attention. Focused session on sitting tolerance, sitting balance, attention, and occupational participation. Use of bed to achieve upright posture and pt requiring Max A to long sit in bed without back support. Able to achieve sitting balance of Min Guard-Min A with repositioning of pelvis. Total hand over hand for grooming while in sitting. VSS. Continue to recommend dc to [facility] for intensive OT and will continue to follow acutely as admitted.    Recommendations for follow up therapy are one component of a multi-disciplinary discharge planning process, led by the attending physician.  Recommendations may be updated based on patient status, additional functional criteria and insurance authorization.    Follow Up Recommendations  Acute inpatient rehab (3hours/day)    Assistance Recommended at  Discharge    Patient can return home with the following  Two people to help with walking and/or transfers;Two people to help with bathing/dressing/bathroom;Assistance with cooking/housework;Assistance with feeding;Direct supervision/assist for medications management;Direct supervision/assist for financial management;Assist for transportation;Help with stairs or ramp for entrance   Equipment Recommendations  Other (comment) (Pending progress)    Recommendations for Other Services Rehab consult    Precautions / Restrictions Precautions Precautions: Fall;Back Precaution Booklet Issued: No Required Braces or Orthoses: Other Brace Other Brace: no brace needed per orders Restrictions Weight Bearing Restrictions: No       Mobility Bed Mobility               General bed mobility comments: Use of bed to position into upright posture "chair position". Then providing support into long sitting.    Transfers                   General transfer comment: Defered due to safety     Balance Overall balance assessment: Needs assistance Sitting-balance support: No upper extremity supported (long sitting) Sitting balance-Leahy Scale: Poor Sitting balance - Comments: Initially requiring Max A for upright posture long sitting in bed. However, repositioning for decreasing posterior tilt of pelvis. Pt then able to achieve sitting balance with Min Guard A for 1 min. Alternating between MIn Gaurd and MIn A per fatgiue                                   ADL either performed or assessed with clinical judgement   ADL Overall ADL's : Needs assistance/impaired     Grooming: Total assistance;Bed level Grooming Details (indicate cue type and reason): While long sitting in bed with support  by PT (Min Guard-Min A), Total hand over hand provided to wash face. Having patient hold therapist's wrist to increase participation and possible muscle engagement.                                General ADL Comments: Focused session on sitting balance, attention, and participation.    Extremity/Trunk Assessment Upper Extremity Assessment Upper Extremity Assessment: RUE deficits/detail;LUE deficits/detail RUE Deficits / Details: PROM WFL; grimacing with certain movements. Pt able to maintain grasp on soap bottle (empty), but poor attention to open and close hands. RUE Coordination: decreased fine motor;decreased gross motor LUE Deficits / Details: PROM WFL; grimacing with certain movements. Pt able to maintain grasp on soap bottle (empty), but poor attention to open and close hands. LUE Coordination: decreased fine motor;decreased gross motor   Lower Extremity Assessment Lower Extremity Assessment: Defer to PT evaluation        Vision       Perception     Praxis      Cognition Arousal/Alertness: Lethargic Behavior During Therapy: Flat affect Overall Cognitive Status: Difficult to assess                                 General Comments: Pt with more lethargy this session. Difficult to arouse at first and then alternating between open/closed eyes. More awake by end of session. Poor attention and difficulty both following one step commands as well as maintaining sustain visual attention. Looking at therapist with name called but then rapidly looking around room.        Exercises Exercises: Other exercises Other Exercises Other Exercises: Long sitting in bed - focusing on righting reactions and correcting lateral leans. Other Exercises: Use of mirror for attention; cues to locate self in mirror and maintain attention    Shoulder Instructions       General Comments HR 130s. SpO2 90s on trach collar with 8L and FiO2 35%.    Pertinent Vitals/ Pain       Pain Assessment Pain Assessment: Faces Faces Pain Scale: Hurts a little bit Pain Location: general discomfort with coughing Pain Descriptors / Indicators: Grimacing Pain  Intervention(s): Monitored during session, Limited activity within patient's tolerance, Repositioned  Home Living                                          Prior Functioning/Environment              Frequency  Min 2X/week        Progress Toward Goals  OT Goals(current goals can now be found in the care plan section)  Progress towards OT goals: Not progressing toward goals - comment (More lethargic)  Acute Rehab OT Goals OT Goal Formulation: With patient Time For Goal Achievement: 03/19/22 Potential to Achieve Goals: Fair ADL Goals Pt Will Perform Grooming: sitting;with mod assist Pt Will Perform Upper Body Dressing: with mod assist;sitting Pt Will Perform Lower Body Dressing: with max assist;sit to/from stand Pt Will Transfer to Toilet: with mod assist;stand pivot transfer;bedside commode Additional ADL Goal #1: Pt will complete bed mobility with mod A as a precursor to ADLs Additional ADL Goal #2: Pt will tolerate at least 5 minutes of EOB funcitonal activity while sitting unsupported  Plan Discharge plan remains  appropriate    Co-evaluation    PT/OT/SLP Co-Evaluation/Treatment: Yes Reason for Co-Treatment: To address functional/ADL transfers;For patient/therapist safety;Complexity of the patient's impairments (multi-system involvement)   OT goals addressed during session: ADL's and self-care      AM-PAC OT "6 Clicks" Daily Activity     Outcome Measure   Help from another person eating meals?: Total Help from another person taking care of personal grooming?: Total Help from another person toileting, which includes using toliet, bedpan, or urinal?: Total Help from another person bathing (including washing, rinsing, drying)?: Total Help from another person to put on and taking off regular upper body clothing?: Total Help from another person to put on and taking off regular lower body clothing?: Total 6 Click Score: 6    End of Session  Equipment Utilized During Treatment: Oxygen  OT Visit Diagnosis: Other abnormalities of gait and mobility (R26.89);Muscle weakness (generalized) (M62.81);Pain Pain - part of body:  (Generalized)   Activity Tolerance Patient limited by lethargy   Patient Left in bed;with call bell/phone within reach;with SCD's reapplied   Nurse Communication Mobility status;Other (comment) (Benefit from air mattress)        Time: 4854-6270 OT Time Calculation (min): 42 min  Charges: OT General Charges $OT Visit: 1 Visit OT Treatments $Self Care/Home Management : 8-22 mins $Therapeutic Activity: 8-22 mins  [place], OTR/L Acute Rehab Pager: [phone] Office: [place] 03/15/2022, 4:22 PM

## 2022-03-16 ENCOUNTER — Inpatient Hospital Stay (HOSPITAL_COMMUNITY): Payer: BC Managed Care – PPO

## 2022-03-16 DIAGNOSIS — R7989 Other specified abnormal findings of blood chemistry: Secondary | ICD-10-CM | POA: Diagnosis not present

## 2022-03-16 DIAGNOSIS — G061 Intraspinal abscess and granuloma: Secondary | ICD-10-CM | POA: Diagnosis not present

## 2022-03-16 DIAGNOSIS — N10 Acute tubulo-interstitial nephritis: Secondary | ICD-10-CM | POA: Diagnosis not present

## 2022-03-16 DIAGNOSIS — Z794 Long term (current) use of insulin: Secondary | ICD-10-CM

## 2022-03-16 DIAGNOSIS — N390 Urinary tract infection, site not specified: Secondary | ICD-10-CM | POA: Diagnosis not present

## 2022-03-16 DIAGNOSIS — B9620 Unspecified Escherichia coli [E. coli] as the cause of diseases classified elsewhere: Secondary | ICD-10-CM | POA: Diagnosis not present

## 2022-03-16 DIAGNOSIS — Z0289 Encounter for other administrative examinations: Secondary | ICD-10-CM

## 2022-03-16 DIAGNOSIS — J9601 Acute respiratory failure with hypoxia: Secondary | ICD-10-CM | POA: Diagnosis not present

## 2022-03-16 DIAGNOSIS — E139 Other specified diabetes mellitus without complications: Secondary | ICD-10-CM

## 2022-03-16 LAB — COMPREHENSIVE METABOLIC PANEL
ALT: 16 U/L (ref 0–44)
AST: 21 U/L (ref 15–41)
Albumin: 2.4 g/dL — ABNORMAL LOW (ref 3.5–5.0)
Alkaline Phosphatase: 105 U/L (ref 38–126)
Anion gap: 8 (ref 5–15)
BUN: 40 mg/dL — ABNORMAL HIGH (ref 6–20)
CO2: 27 mmol/L (ref 22–32)
Calcium: 8.6 mg/dL — ABNORMAL LOW (ref 8.9–10.3)
Chloride: 127 mmol/L — ABNORMAL HIGH (ref 98–111)
Creatinine, Ser: 1.05 mg/dL — ABNORMAL HIGH (ref 0.44–1.00)
GFR, Estimated: >60 mL/min (ref >60)
Glucose, Bld: 107 mg/dL — ABNORMAL HIGH (ref 70–99)
Potassium: 4.1 mmol/L (ref 3.5–5.1)
Sodium: 162 mmol/L (ref 135–145)
Total Bilirubin: 0.4 mg/dL (ref 0.3–1.2)
Total Protein: 7.3 g/dL (ref 6.5–8.1)

## 2022-03-16 LAB — GLUCOSE, CAPILLARY
Glucose-Capillary: 102 mg/dL — ABNORMAL HIGH (ref 70–99)
Glucose-Capillary: 133 mg/dL — ABNORMAL HIGH (ref 70–99)
Glucose-Capillary: 140 mg/dL — ABNORMAL HIGH (ref 70–99)
Glucose-Capillary: 186 mg/dL — ABNORMAL HIGH (ref 70–99)
Glucose-Capillary: 207 mg/dL — ABNORMAL HIGH (ref 70–99)
Glucose-Capillary: 266 mg/dL — ABNORMAL HIGH (ref 70–99)

## 2022-03-16 LAB — CBC
HCT: 32.3 % — ABNORMAL LOW (ref 36.0–46.0)
Hemoglobin: 9.5 g/dL — ABNORMAL LOW (ref 12.0–15.0)
MCH: 29.3 pg (ref 26.0–34.0)
MCHC: 29.4 g/dL — ABNORMAL LOW (ref 30.0–36.0)
MCV: 99.7 fL (ref 80.0–100.0)
Platelets: 233 10*3/uL (ref 150–400)
RBC: 3.24 MIL/uL — ABNORMAL LOW (ref 3.87–5.11)
RDW: 19.7 % — ABNORMAL HIGH (ref 11.5–15.5)
WBC: 12.3 10*3/uL — ABNORMAL HIGH (ref 4.0–10.5)
nRBC: 0.3 % — ABNORMAL HIGH (ref 0.0–0.2)

## 2022-03-16 LAB — SODIUM: Sodium: 162 mmol/L (ref 135–145)

## 2022-03-16 LAB — MAGNESIUM: Magnesium: 2.9 mg/dL — ABNORMAL HIGH (ref 1.7–2.4)

## 2022-03-16 IMAGING — MR MR CERVICAL SPINE WO/W CM
5 of 10 series · 18 of 48 positions shown · IV contrast (Yes GAD)
Comparison: Postoperative cervical spine MRI [DATE].

CLINICAL DATA: 56-year-old female. Severe spinal epidural abscess
last month, cervical through lumbar. Meningitis. Quadriplegia.
TECHNIQUE: Multiplanar and multiecho pulse sequences of the cervical spine, to
include the craniocervical junction and cervicothoracic junction,
were obtained without and with intravenous contrast.

CONTRAST:  9.5mL GADAVIST GADOBUTROL 1 MMOL/ML IV SOLN

[Series 10: T1 · sagittal · 3.0mm · 0.90mm/px · 3 of 12 slices shown (1 of 2)]
[im 1/12]
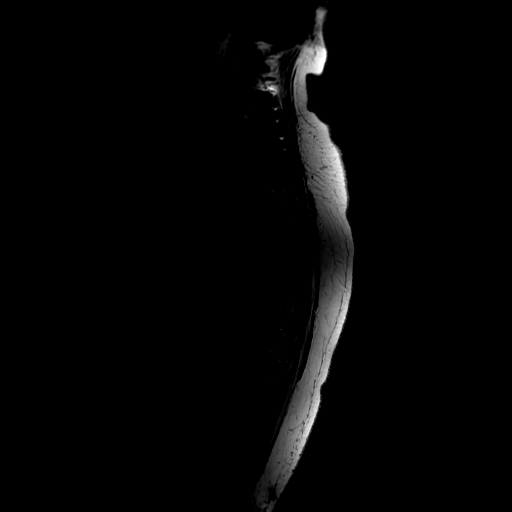
[im 6/12]
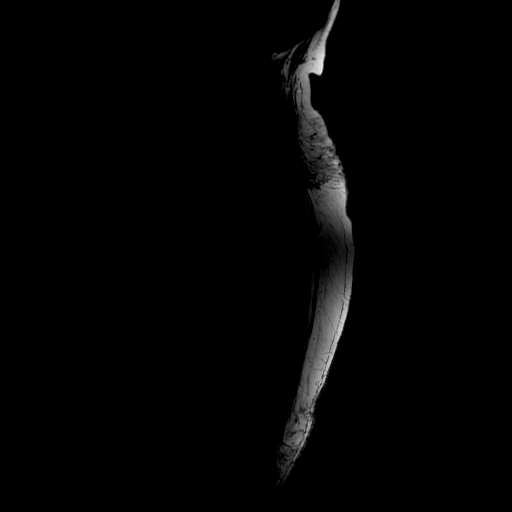
[im 12/12]
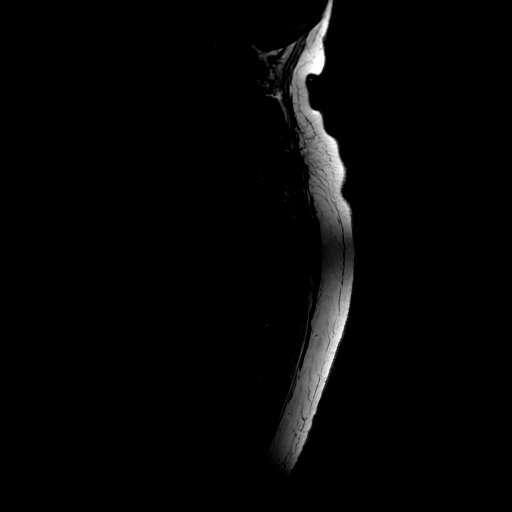

[Series 11: T2 · sagittal · 3.0mm · 0.66mm/px · 2 of 15 slices shown (1 of 3)]
[im 1/15]
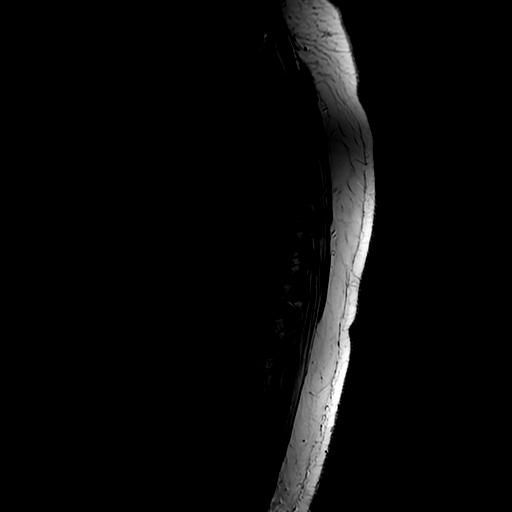
[im 15/15]
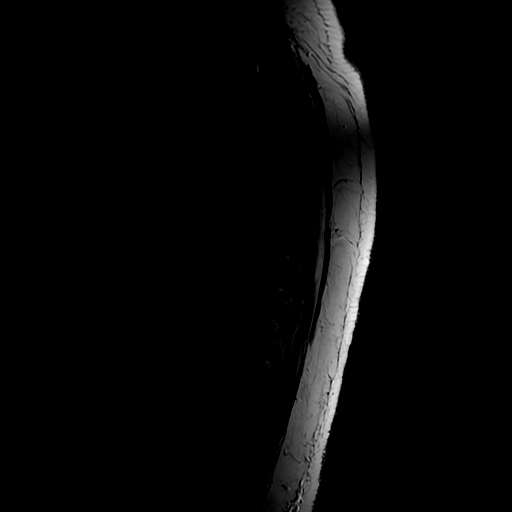

[Series 13: T1 · sagittal · 3.0mm · 0.66mm/px · 2 of 16 slices shown (2 of 2)]
[im 1/16]
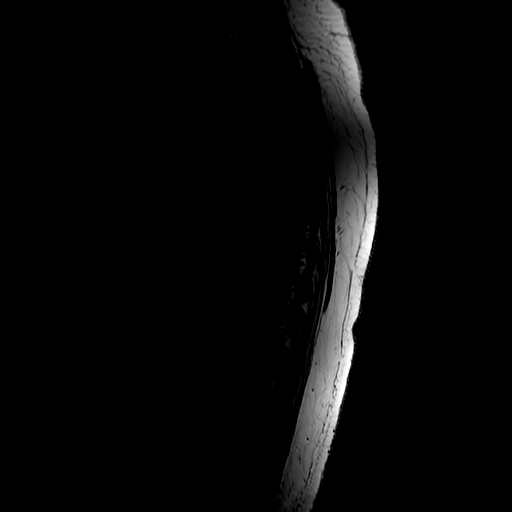
[im 8/16]
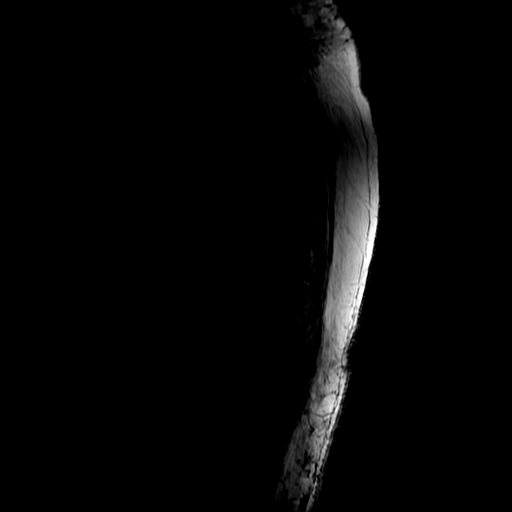

[Series 14: T2 · sagittal · 3.0mm · 0.66mm/px · 3 of 16 slices shown (2 of 3)]
[im 1/16]
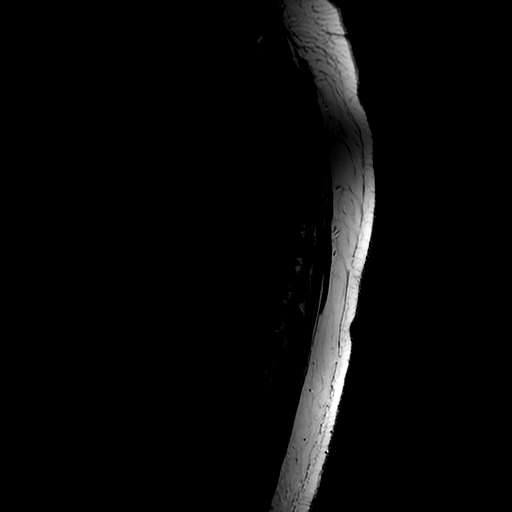
[im 8/16]
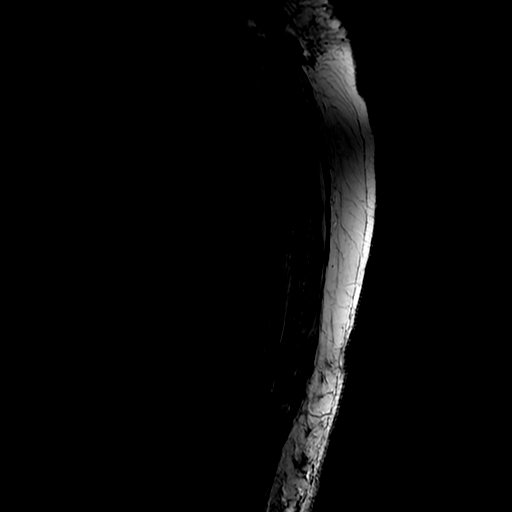
[im 16/16]
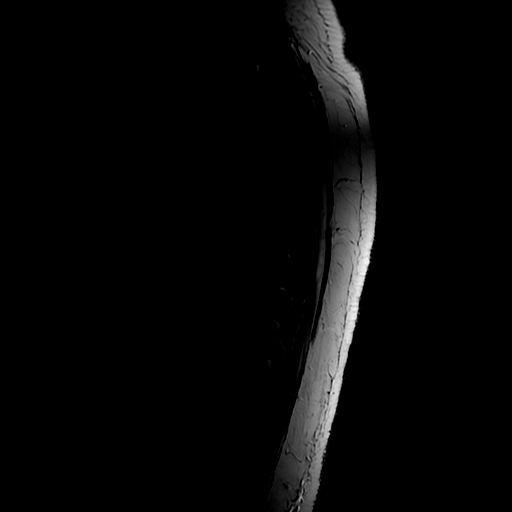

[Series 15: T2 · axial · 4.0mm · 0.39mm/px · z∈[-338,-108]mm · 8 of 49 slices shown (3 of 3)]
[im 1/49]
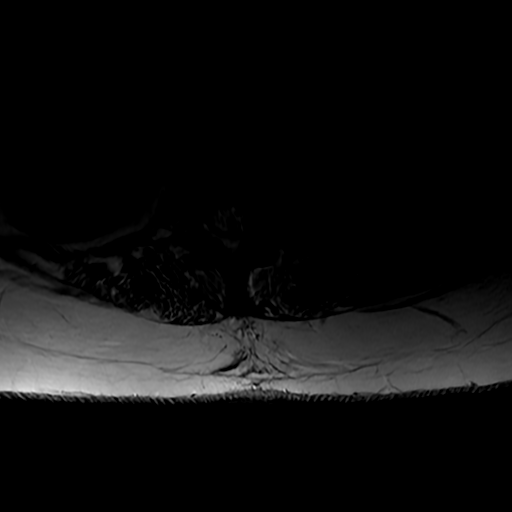
[im 7/49]
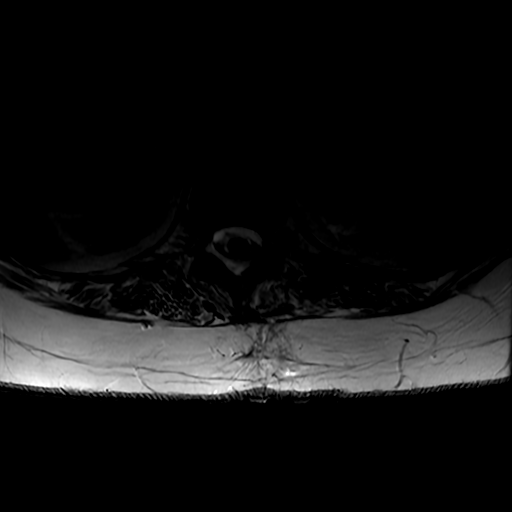
[im 14/49]
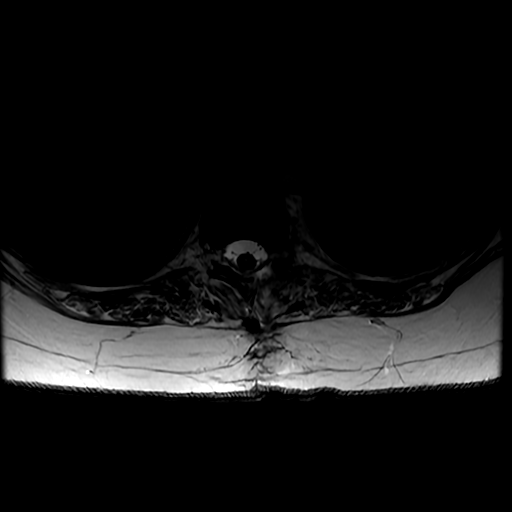
[im 21/49]
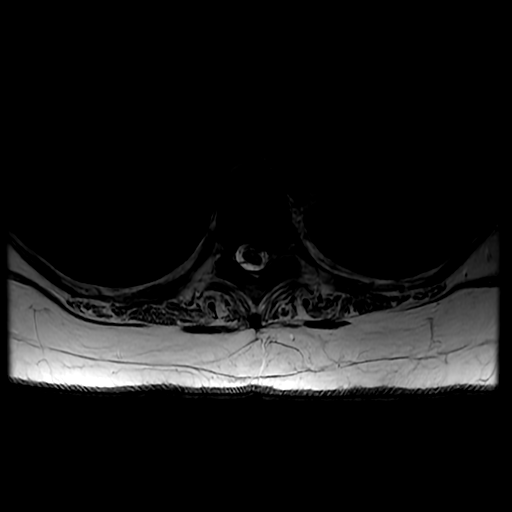
[im 28/49]
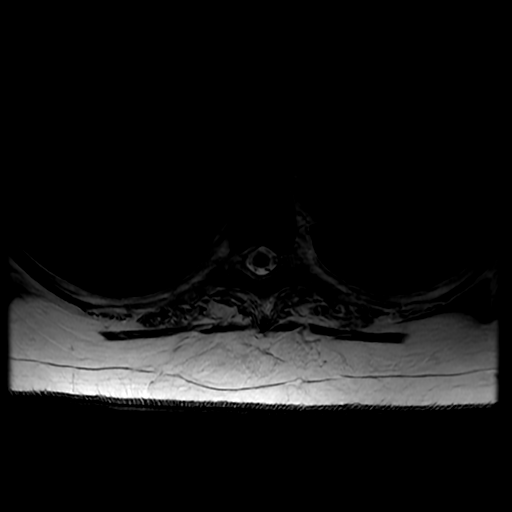
[im 35/49]
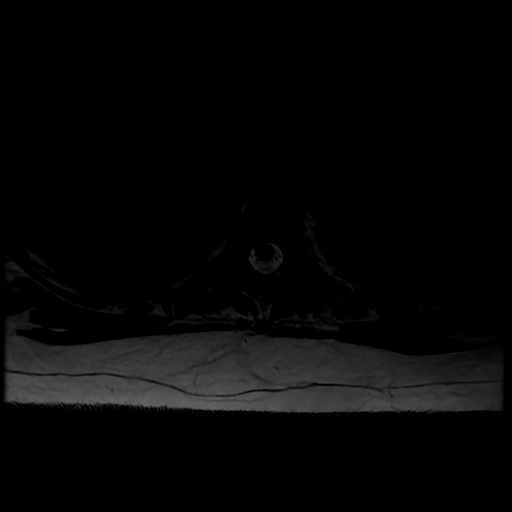
[im 42/49]
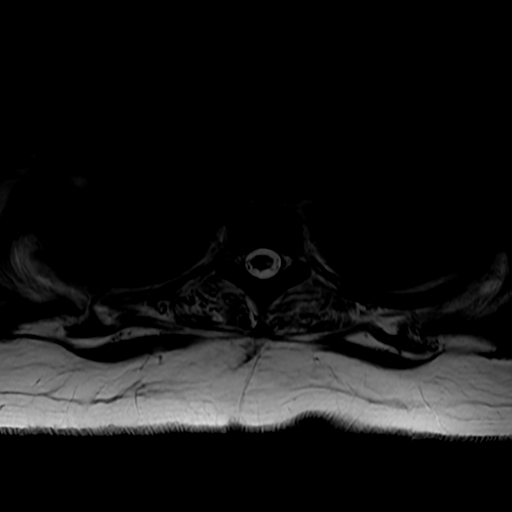
[im 49/49]
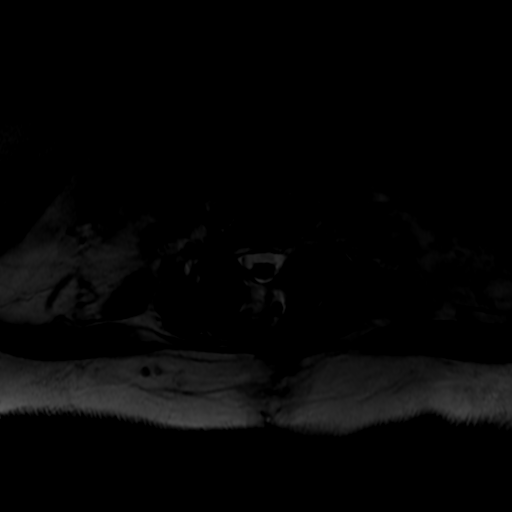

[18 of 48 positions shown; findings below may reference images not displayed]

Status post [DATE] C3 through T1 posterior decompression of
epidural abscess, C3 through C7 posterior fusion. And status post
[DATE] posterior epidural abscess evacuation from T12 utilizing
pediatric feeding tube assisted evacuation of frank pus and necrotic
fatty tissue from the thoracolumbar epidural space.

Persistent fever and leukocytosis now.

EXAM:
MRI CERVICAL SPINE WITHOUT AND WITH CONTRAST
FINDINGS: Alignment: Stable straightening of cervical lordosis. No significant
spondylolisthesis.

Vertebrae: Posterior decompression from C3 through the superior T1
lamina. Bilateral posterior spinal fusion hardware with mild
susceptibility artifact. No convincing marrow edema. Background bone
marrow signal is within normal limits.

Cord: Cervical spinal cord signal and morphology now appears normal.
No epidural or intrathecal collection identified. Mild posterior
dural thickening and enhancement suspected, no abnormal intradural
enhancement.

Posterior Fossa, vertebral arteries, paraspinal tissues:
Cervicomedullary junction is within normal limits. Grossly stable
visible brain parenchyma, conspicuous dilated perivascular spaces in
the pons. No abnormal posterior fossa enhance min identified.

Posterior decompression and fusion surgical changes with fairly
simple appearing fluid collection in the laminectomy space, no
associated mass effect on the thecal sac. No unexpected paraspinal
soft tissue inflammation. Partially visible tracheostomy tube at the
thoracic inlet. Partially visible enteric tube coursing into the
esophagus.

Disc levels:

C2-C3 and C3-C4 levels appear stable, negative.

C4-C5 through C6-C7. Subtle increased T2 and STIR signal within the
disc spaces, which appear chronically degenerated otherwise with
disc space loss and circumferential disc osteophyte complex. Of
these, there is postcontrast enhancement of the C5-C6 disc space
(series 28, image 9), which is stable or regressed since [DATE].
No convincing new endplate erosion there.

Capacious cervical spinal canal following decompression.

C7-T1: Stable and negative.
IMPRESSION: 1. Abnormal enhancement of the C5-C6 disc space strongly suggestive
of Discitis, and additional trace fluid signal in the adjacent C4-C5
and C6-C7 discs. But no convincing progression since [DATE] and
no convincing endplate Osteomyelitis.

2. Satisfactory postoperative appearance of posterior decompression
and fusion from C3 through C7. Midline postoperative seroma.

## 2022-03-16 IMAGING — MR MR CERVICAL SPINE WO/W CM
5 of 9 series · 18 of 48 positions shown · IV contrast (gadavist)
Comparison: Postoperative cervical spine MRI [DATE].

CLINICAL DATA: 56-year-old female. Severe spinal epidural abscess
last month, cervical through lumbar. Meningitis. Quadriplegia.
TECHNIQUE: Multiplanar and multiecho pulse sequences of the cervical spine, to
include the craniocervical junction and cervicothoracic junction,
were obtained without and with intravenous contrast.

CONTRAST:  9.5mL GADAVIST GADOBUTROL 1 MMOL/ML IV SOLN

[Series 3: T2 · sagittal · 3.0mm · 0.43mm/px · 4 of 17 slices shown (1 of 2)]
[im 1/17]
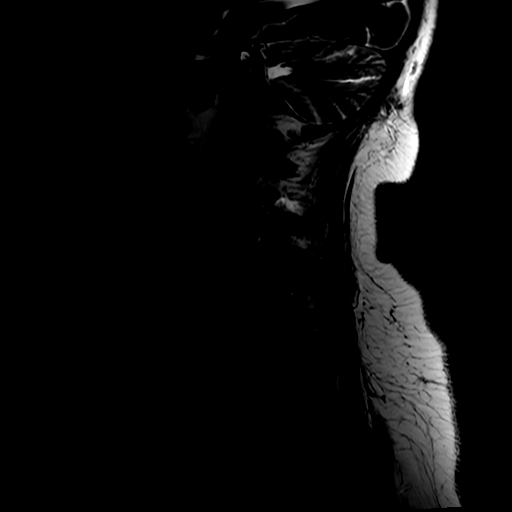
[im 6/17]
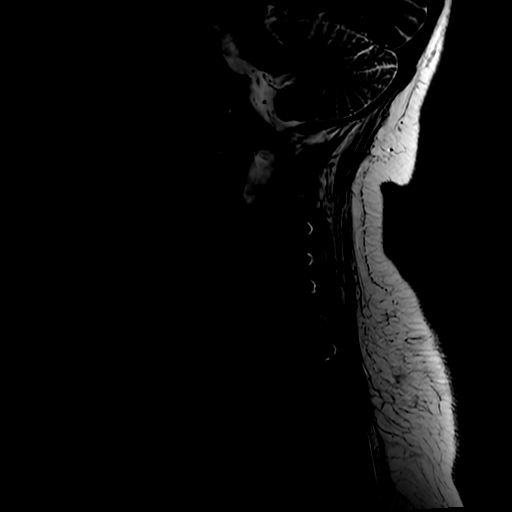
[im 11/17]
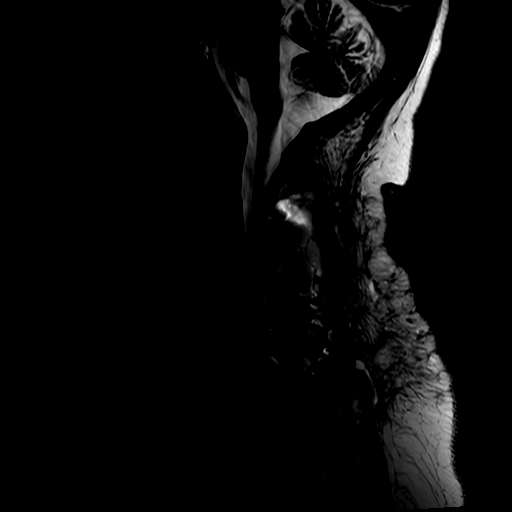
[im 17/17]
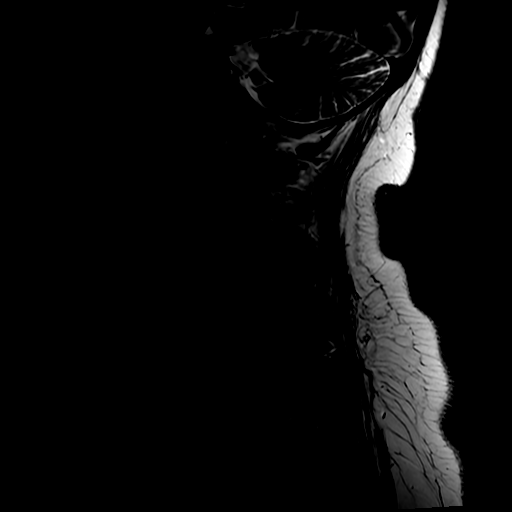

[Series 5: STIR · sagittal · 3.0mm · 0.43mm/px · 1 of 17 slices shown]
[im 1/17]
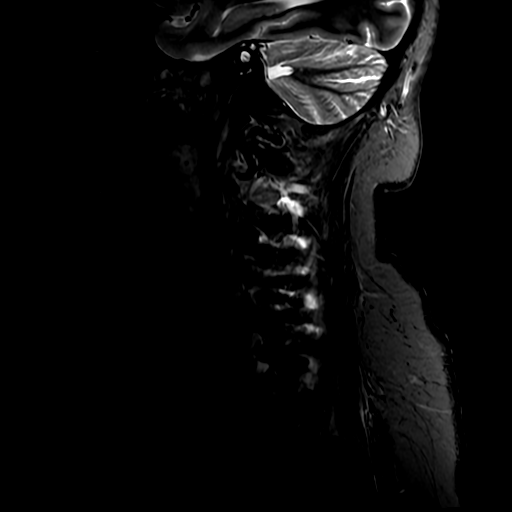

[Series 7: T2 · axial · 3.0mm · 0.35mm/px · z∈[-106,-25]mm · 5 of 27 slices shown (2 of 2)]
[im 1/27]
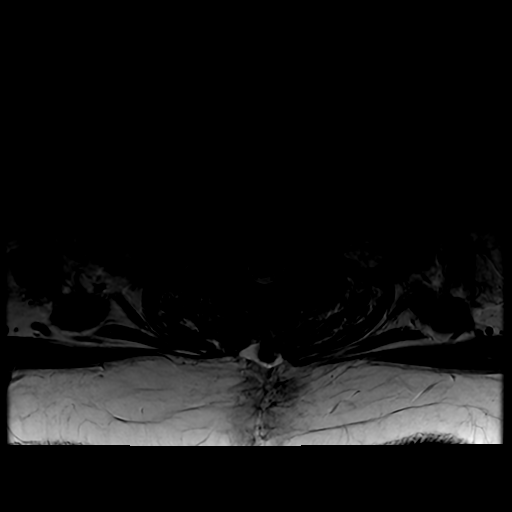
[im 7/27]
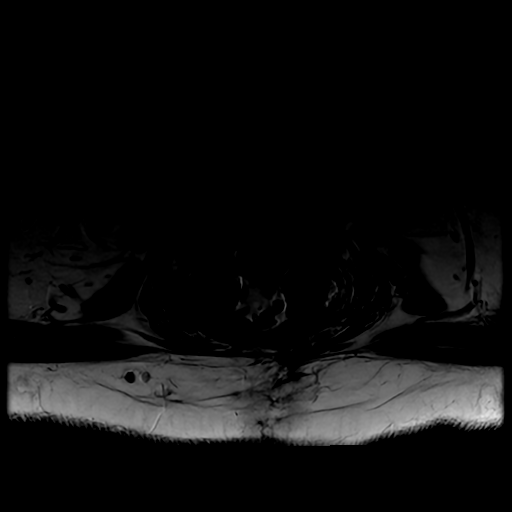
[im 14/27]
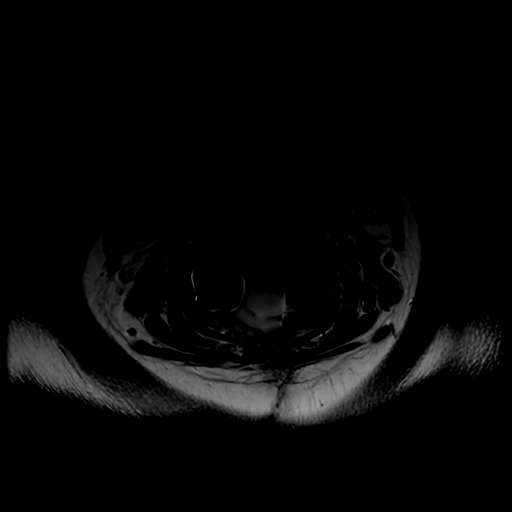
[im 20/27]
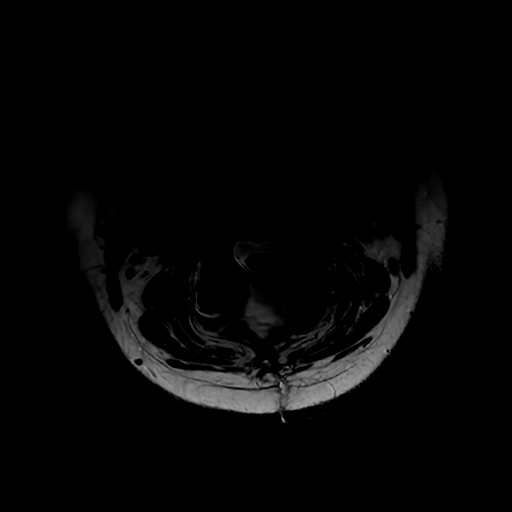
[im 27/27]
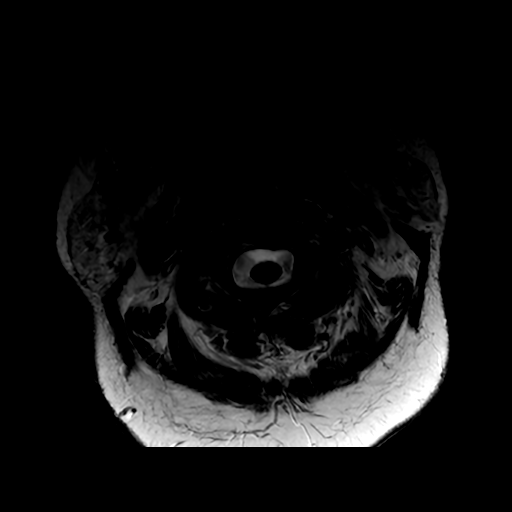

[Series 8: T1 · axial · non-contrast · 3.0mm · 0.35mm/px · z∈[-106,-25]mm · 5 of 27 slices shown]
[im 1/27]
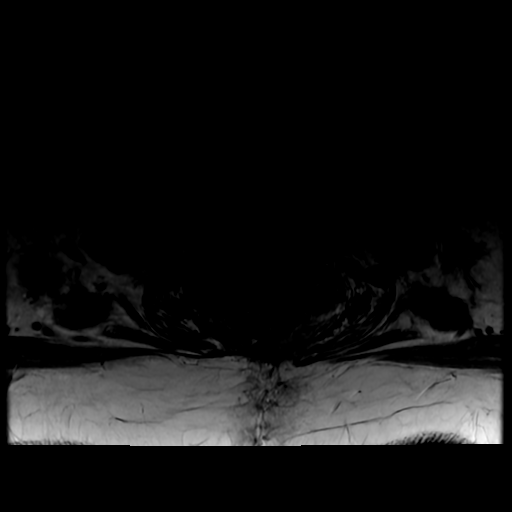
[im 7/27]
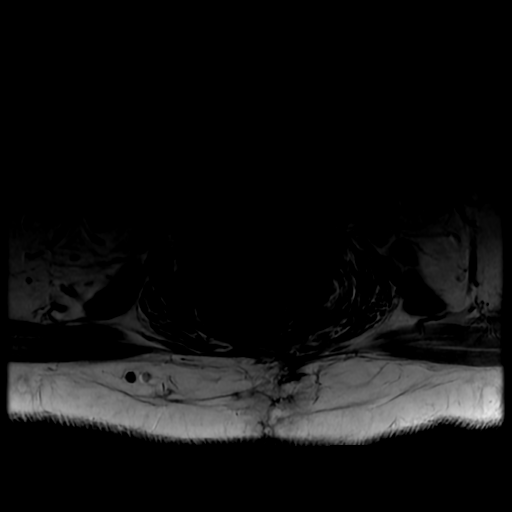
[im 14/27]
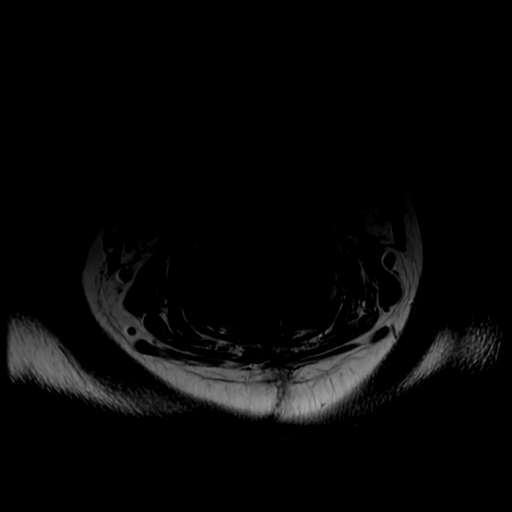
[im 20/27]
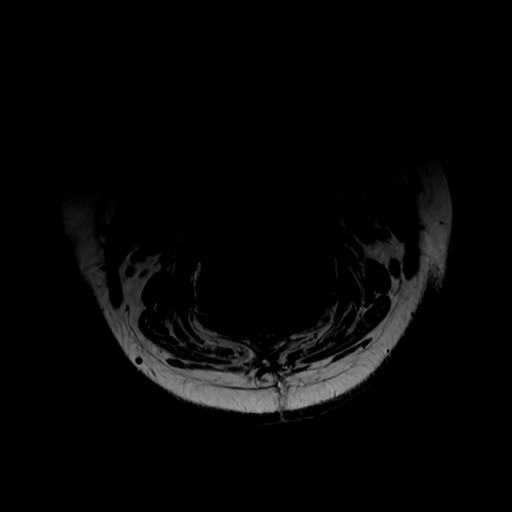
[im 27/27]
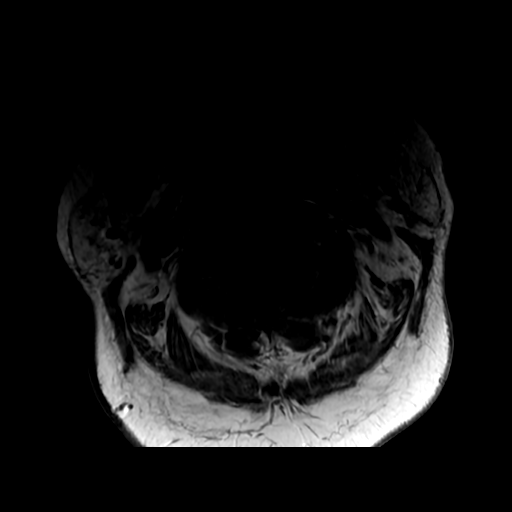

[Series 28: T1 fat-sat post-contrast · sagittal · 3.0mm · 0.43mm/px · 3 of 17 slices shown]
[im 1/17]
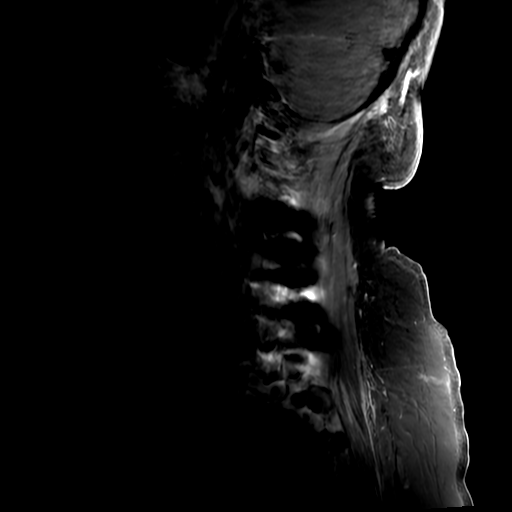
[im 9/17]
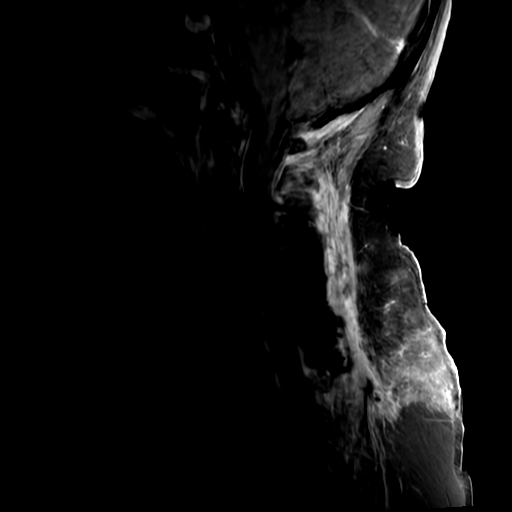
[im 17/17]
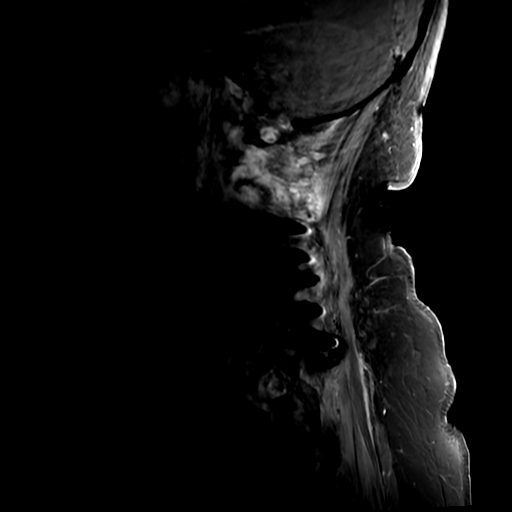

[18 of 48 positions shown; findings below may reference images not displayed]

Status post [DATE] C3 through T1 posterior decompression of
epidural abscess, C3 through C7 posterior fusion. And status post
[DATE] posterior epidural abscess evacuation from T12 utilizing
pediatric feeding tube assisted evacuation of frank pus and necrotic
fatty tissue from the thoracolumbar epidural space.

Persistent fever and leukocytosis now.

EXAM:
MRI CERVICAL SPINE WITHOUT AND WITH CONTRAST
FINDINGS: Alignment: Stable straightening of cervical lordosis. No significant
spondylolisthesis.

Vertebrae: Posterior decompression from C3 through the superior T1
lamina. Bilateral posterior spinal fusion hardware with mild
susceptibility artifact. No convincing marrow edema. Background bone
marrow signal is within normal limits.

Cord: Cervical spinal cord signal and morphology now appears normal.
No epidural or intrathecal collection identified. Mild posterior
dural thickening and enhancement suspected, no abnormal intradural
enhancement.

Posterior Fossa, vertebral arteries, paraspinal tissues:
Cervicomedullary junction is within normal limits. Grossly stable
visible brain parenchyma, conspicuous dilated perivascular spaces in
the pons. No abnormal posterior fossa enhance min identified.

Posterior decompression and fusion surgical changes with fairly
simple appearing fluid collection in the laminectomy space, no
associated mass effect on the thecal sac. No unexpected paraspinal
soft tissue inflammation. Partially visible tracheostomy tube at the
thoracic inlet. Partially visible enteric tube coursing into the
esophagus.

Disc levels:

C2-C3 and C3-C4 levels appear stable, negative.

C4-C5 through C6-C7. Subtle increased T2 and STIR signal within the
disc spaces, which appear chronically degenerated otherwise with
disc space loss and circumferential disc osteophyte complex. Of
these, there is postcontrast enhancement of the C5-C6 disc space
(series 28, image 9), which is stable or regressed since [DATE].
No convincing new endplate erosion there.

Capacious cervical spinal canal following decompression.

C7-T1: Stable and negative.
IMPRESSION: 1. Abnormal enhancement of the C5-C6 disc space strongly suggestive
of Discitis, and additional trace fluid signal in the adjacent C4-C5
and C6-C7 discs. But no convincing progression since [DATE] and
no convincing endplate Osteomyelitis.

2. Satisfactory postoperative appearance of posterior decompression
and fusion from C3 through C7. Midline postoperative seroma.

## 2022-03-16 IMAGING — MR MR CERVICAL SPINE WO/W CM
4 of 7 series · 18 of 48 positions shown · IV contrast (gadavist)
Comparison: Postoperative cervical spine MRI [DATE].

CLINICAL DATA: 56-year-old female. Severe spinal epidural abscess
last month, cervical through lumbar. Meningitis. Quadriplegia.
TECHNIQUE: Multiplanar and multiecho pulse sequences of the cervical spine, to
include the craniocervical junction and cervicothoracic junction,
were obtained without and with intravenous contrast.

CONTRAST:  9.5mL GADAVIST GADOBUTROL 1 MMOL/ML IV SOLN

[Series 19: T2 · sagittal · 4.0mm · 0.55mm/px · 5 of 15 slices shown (1 of 2)]
[im 1/15]
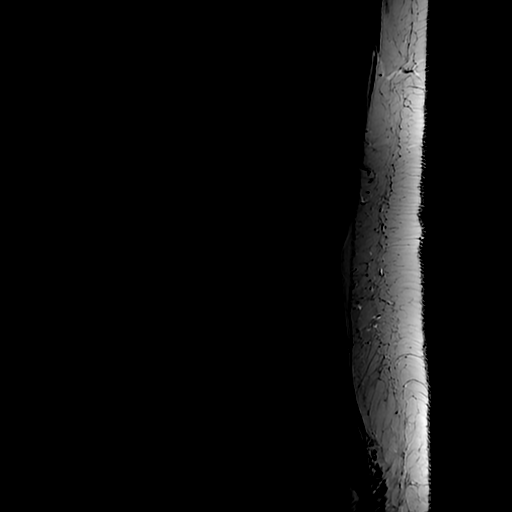
[im 4/15]
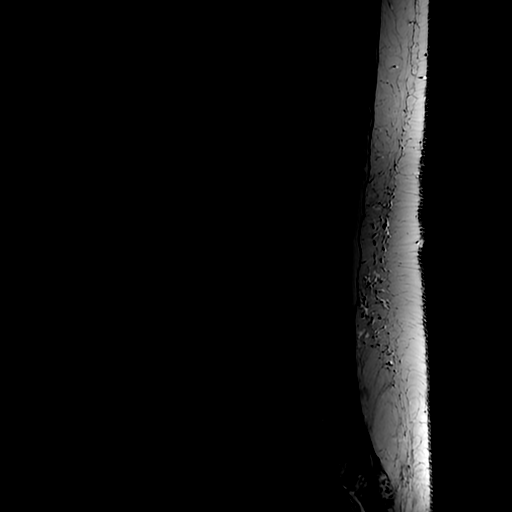
[im 8/15]
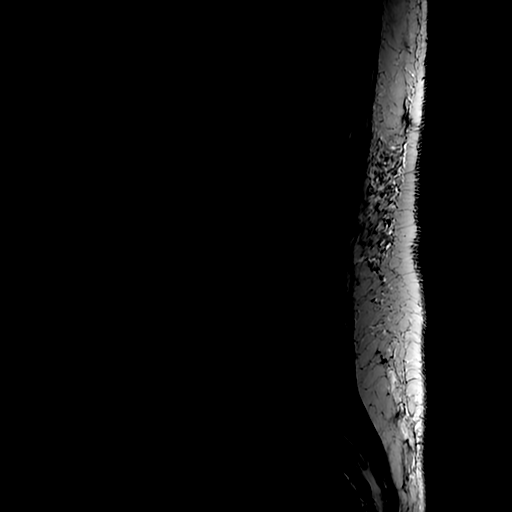
[im 11/15]
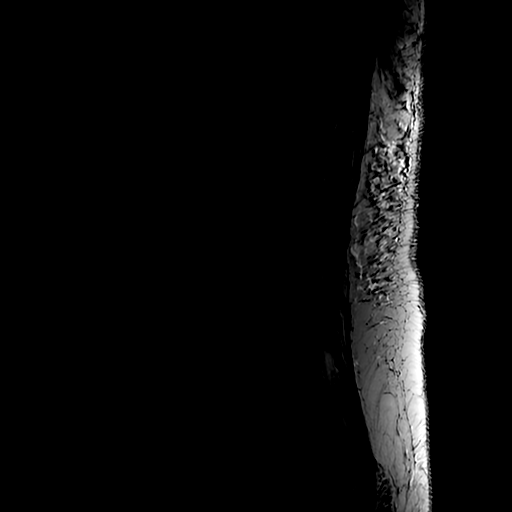
[im 15/15]
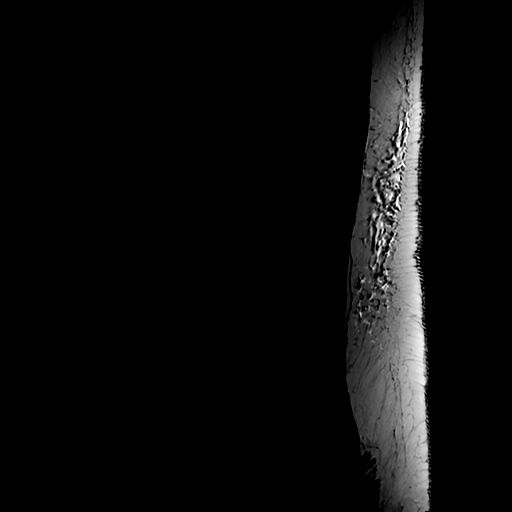

[Series 21: T1 · sagittal · 4.0mm · 0.55mm/px · 3 of 15 slices shown (1 of 2)]
[im 1/15]
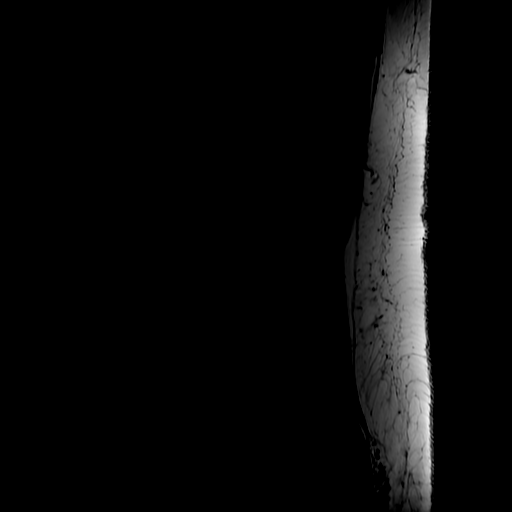
[im 10/15]
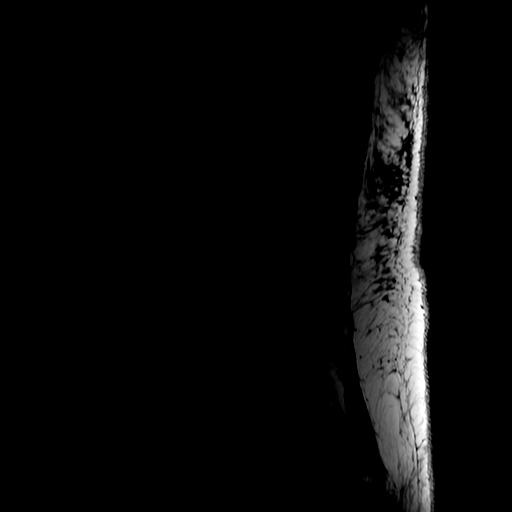
[im 15/15]
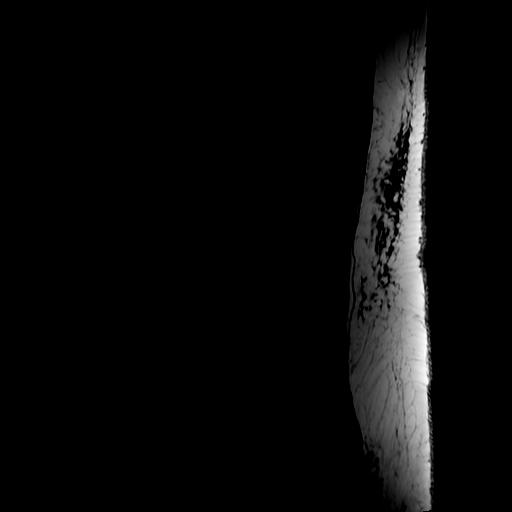

[Series 22: T2 · axial · 4.0mm · 0.39mm/px · z∈[-515,-317]mm · 7 of 37 slices shown (2 of 2)]
[im 1/37]
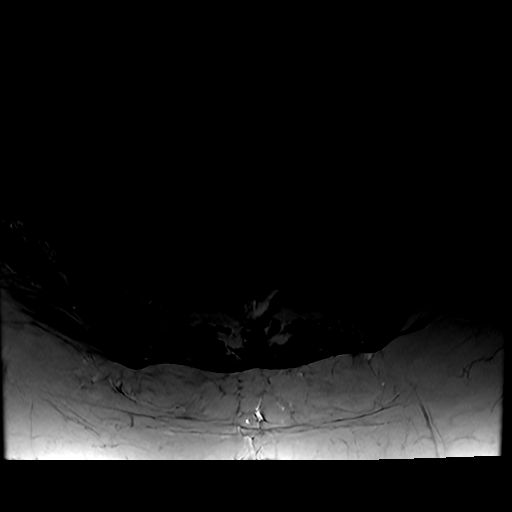
[im 5/37]
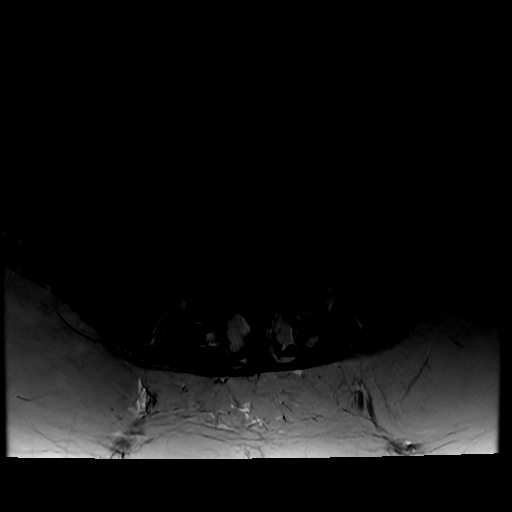
[im 13/37]
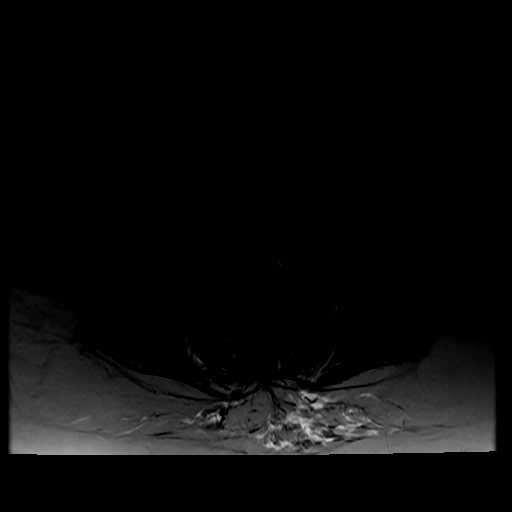
[im 17/37]
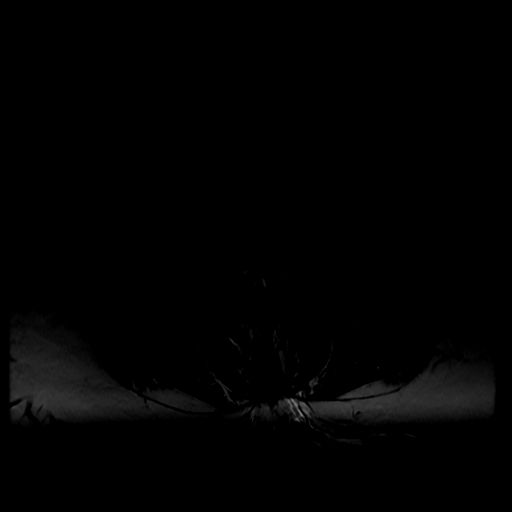
[im 21/37]
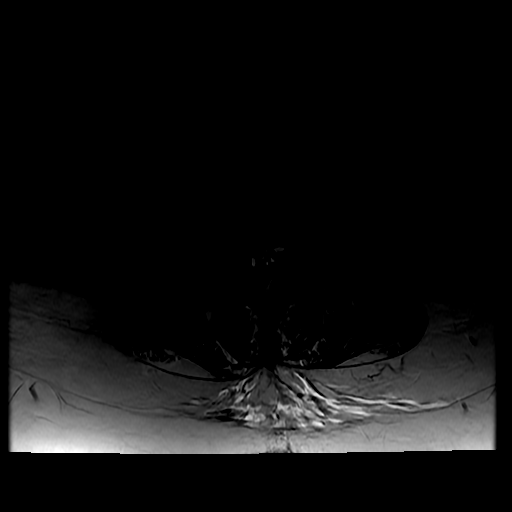
[im 25/37]
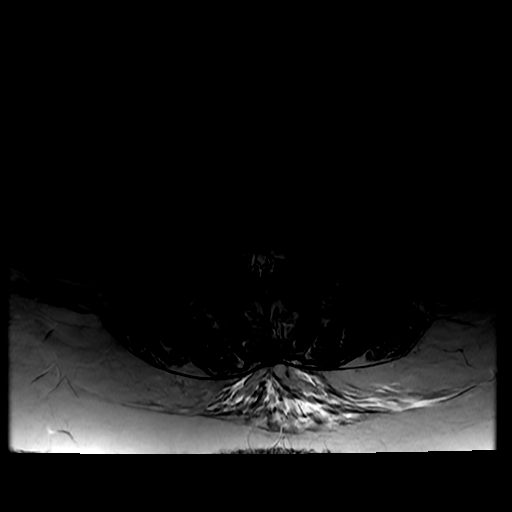
[im 33/37]
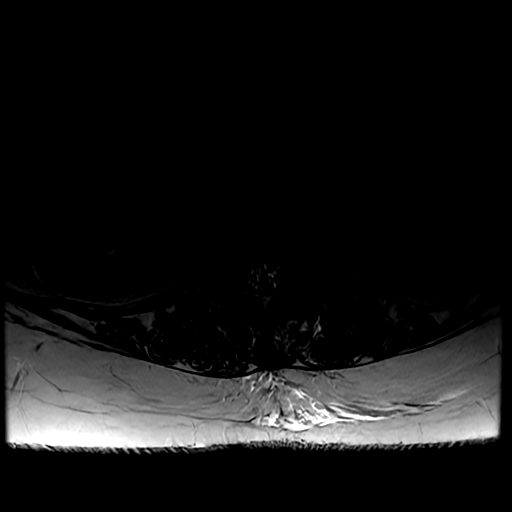

[Series 23: T1 · axial · 4.0mm · 0.39mm/px · z∈[-495,-317]mm · 3 of 37 slices shown (2 of 2)]
[im 5/37]
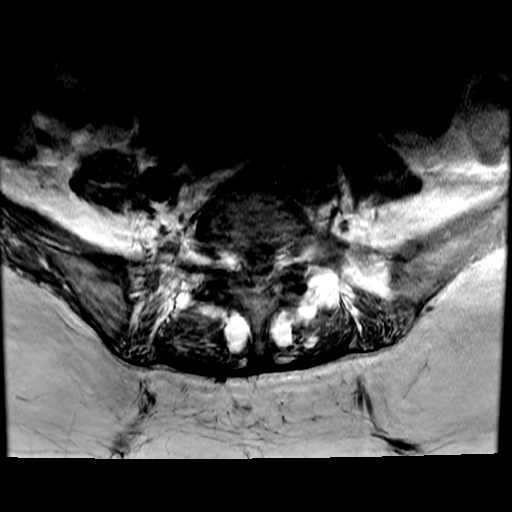
[im 21/37]
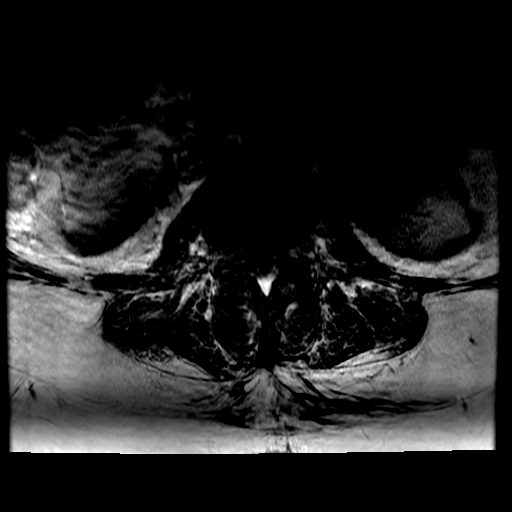
[im 33/37]
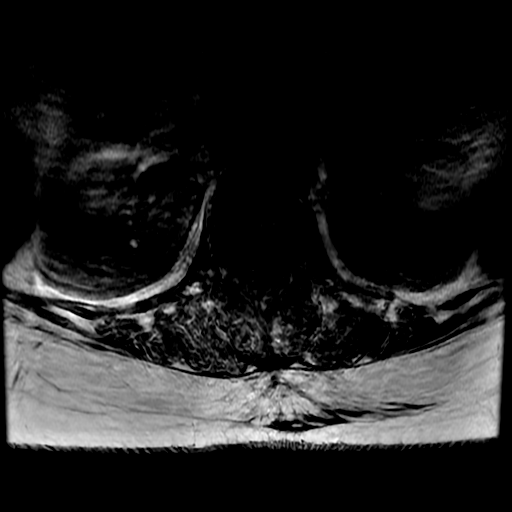

[18 of 48 positions shown; findings below may reference images not displayed]

Status post [DATE] C3 through T1 posterior decompression of
epidural abscess, C3 through C7 posterior fusion. And status post
[DATE] posterior epidural abscess evacuation from T12 utilizing
pediatric feeding tube assisted evacuation of frank pus and necrotic
fatty tissue from the thoracolumbar epidural space.

Persistent fever and leukocytosis now.

EXAM:
MRI CERVICAL SPINE WITHOUT AND WITH CONTRAST
FINDINGS: Alignment: Stable straightening of cervical lordosis. No significant
spondylolisthesis.

Vertebrae: Posterior decompression from C3 through the superior T1
lamina. Bilateral posterior spinal fusion hardware with mild
susceptibility artifact. No convincing marrow edema. Background bone
marrow signal is within normal limits.

Cord: Cervical spinal cord signal and morphology now appears normal.
No epidural or intrathecal collection identified. Mild posterior
dural thickening and enhancement suspected, no abnormal intradural
enhancement.

Posterior Fossa, vertebral arteries, paraspinal tissues:
Cervicomedullary junction is within normal limits. Grossly stable
visible brain parenchyma, conspicuous dilated perivascular spaces in
the pons. No abnormal posterior fossa enhance min identified.

Posterior decompression and fusion surgical changes with fairly
simple appearing fluid collection in the laminectomy space, no
associated mass effect on the thecal sac. No unexpected paraspinal
soft tissue inflammation. Partially visible tracheostomy tube at the
thoracic inlet. Partially visible enteric tube coursing into the
esophagus.

Disc levels:

C2-C3 and C3-C4 levels appear stable, negative.

C4-C5 through C6-C7. Subtle increased T2 and STIR signal within the
disc spaces, which appear chronically degenerated otherwise with
disc space loss and circumferential disc osteophyte complex. Of
these, there is postcontrast enhancement of the C5-C6 disc space
(series 28, image 9), which is stable or regressed since [DATE].
No convincing new endplate erosion there.

Capacious cervical spinal canal following decompression.

C7-T1: Stable and negative.
IMPRESSION: 1. Abnormal enhancement of the C5-C6 disc space strongly suggestive
of Discitis, and additional trace fluid signal in the adjacent C4-C5
and C6-C7 discs. But no convincing progression since [DATE] and
no convincing endplate Osteomyelitis.

2. Satisfactory postoperative appearance of posterior decompression
and fusion from C3 through C7. Midline postoperative seroma.

## 2022-03-16 MED ORDER — DEXTROSE 5 % IV SOLN: INTRAVENOUS | Status: DC

## 2022-03-16 MED ORDER — GADOBUTROL 1 MMOL/ML IV SOLN
9.5000 mL | Freq: Once | INTRAVENOUS | Status: AC | PRN
  Administered 2022-03-16: 9.5 mL via INTRAVENOUS

## 2022-03-16 NOTE — Progress Notes (Signed)
Serum sodium is critically-high this am. Plan to give D5W at 125 mL/hr and repeat sodium level in 4 hours.

## 2022-03-16 NOTE — Progress Notes (Signed)
[facility]  Date of Admission:  02/14/2022           Reason for visit: Follow up on  E coli epidural abscess   Current antibiotics: Ceftriaxone    ASSESSMENT:    56 y.o. female admitted with:  E coli complicated UTI with extensive epidural abscess extending from lumbar to cervical spine with associated meningitis/encephalitis: Status post cervical decompression and instrumentation C3-7 on 5/13 and thoracolumbar laminectomy and decompression epidural abscess on 5/18.  Most recent MRI T and C-spine 5/23 with enhancement in the disc space and facets around C5-6 and posterior meningeal enhancement concerning for persistent infection as well as epidural collection seen from T11 extending into the lumbar spine (measuring 50m, previously 942m.  NSGY is continuing to follow and repeat MRI C/T/L spine planned today. Newly diagnosed DM: A1c is 8.9. 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 Pressure injury in setting of large rectal ulceration from fecal management system: Receiving wound care and trying to keep area clean.  Right upper lobe 2.2cm spiculating mass: Concerning for malignancy.  CT chest obtained 5/24 with 2.3cm RUL nodule but not diagnostic, however, per CCM appears likely malignancy. Hypoxic respiratory failure status post tracheostomy: Currently on trach collar and transferred to floor 5/30. Urinary retention: In setting of spinal cord injury.  Foley removed 5/30 and failed void trial so replaced 5/31.   RECOMMENDATIONS:    Continue ceftriaxone BID Follow cultures from 5/31 -- NGTD Follow MRI from today Lab monitoring, wound care, glycemic control Will follow.  Dr SiCandiss Norsevailable as needed over the weekend and new ID team to take over on Monday (Dr SiCandiss Norser Dr SnBaxter Flattery   Principal Problem:   Abscess in epidural space of cervical spine Active Problems:   Class II obesity   Encephalopathy acute   Acute respiratory failure (HCC)   E. coli UTI    Bacterial meningitis   Di[place] insipidus [place] [place]  H/O excision of lamina of cervical vertebra for decompression of spinal cord   E coli bacteremia   Pressure injury of skin    MEDICATIONS:    Scheduled Meds: . aspirin  81 mg Per Tube Daily  . chlorhexidine gluconate (MEDLINE KIT)  15 mL Mouth Rinse BID  . Chlorhexidine Gluconate Cloth  6 each Topical Daily  . feeding supplement (JEVITY 1.5 CAL/FIBER)  237 mL Per Tube TID WC  . feeding supplement (JEVITY 1.5 CAL/FIBER)  474 mL Per Tube Q24H  . feeding supplement (PROSource TF)  90 mL Per Tube BID  . free water  150 mL Per Tube Q2H  . Gerhardt's butt cream   Topical BID  . heparin injection (subcutaneous)  5,000 Units Subcutaneous Q8H  . insulin aspart  0-15 Units Subcutaneous Q4H  . insulin aspart  8 Units Subcutaneous Q4H  . insulin glargine-yfgn  25 Units Subcutaneous BID  . mouth rinse  15 mL Mouth Rinse 10 times per day  . midodrine  20 mg Per Tube Q8H  . nutrition supplement (JUVEN)  1 packet Per Tube BID BM  . pantoprazole sodium  40 mg Per Tube Q1400   Continuous Infusions: . cefTRIAXone (ROCEPHIN)  IV Stopped (03/15/22 2136)  . dextrose     PRN Meds:.acetaminophen (TYLENOL) oral liquid 160 mg/5 mL **OR** acetaminophen, bisacodyl, docusate, fentaNYL, fentaNYL (SUBLIMAZE) injection, ipratropium-albuterol, lip balm, menthol-cetylpyridinium **OR** phenol, [DISCONTINUED] ondansetron **OR** ondansetron (ZOFRAN) IV, ondansetron **OR** [DISCONTINUED] ondansetron (ZOFRAN) IV, oxyCODONE, polyethylene glycol, senna, Zinc Oxide  SUBJECTIVE:  24 hour events:  Worsening sodium Still febrile Tmax 102.9 Repeat MRI ordered by NSGY WBC is 12.3 PEG tube placement on hold due to fever, tachy, elevated WBC She is on Trach collar   She appears somnolent.  RN at bedside is about to take her down for MRI.  Review of Systems  Unable to perform ROS: Medical condition     OBJECTIVE:   Blood pressure 130/78, pulse (!) 115,  temperature 99.7 F (37.6 C), temperature source Oral, resp. rate (!) 24, height 5' 4"  (1.626 m), weight 94.9 kg, SpO2 95 %. Body mass index is 35.91 kg/m.  Physical Exam Constitutional:      Comments: Ill appearing, lying in bed, on trach collar, NAD  HENT:     Head: Normocephalic and atraumatic.  Eyes:     Comments: Eyes are closed.   Neck:     Comments: Trach in place.  Pulmonary:     Effort: Pulmonary effort is normal. No respiratory distress.  Abdominal:     General: There is no distension.     Palpations: Abdomen is soft.  Genitourinary:    Comments: Foley catheter in place.  Musculoskeletal:     Comments: Not moving lower extremities.  Skin:    General: Skin is warm and dry.     Findings: No rash.  Neurological:     General: No focal deficit present.     Lab Results: Lab Results  Component Value Date   WBC 12.3 (H) 03/16/2022   HGB 9.5 (L) 03/16/2022   HCT 32.3 (L) 03/16/2022   MCV 99.7 03/16/2022   PLT 233 03/16/2022    Lab Results  Component Value Date   NA 162 (HH) 03/16/2022   K 4.1 03/16/2022   CO2 27 03/16/2022   GLUCOSE 107 (H) 03/16/2022   BUN 40 (H) 03/16/2022   CREATININE 1.05 (H) 03/16/2022   CALCIUM 8.6 (L) 03/16/2022   GFRNONAA >60 03/16/2022    Lab Results  Component Value Date   ALT 16 03/16/2022   AST 21 03/16/2022   ALKPHOS 105 03/16/2022   BILITOT 0.4 03/16/2022    No results found for: CRP  No results found for: ESRSEDRATE   I have reviewed the micro and lab results in Epic.  Imaging: CT ABDOMEN WO CONTRAST  Result Date: 03/14/2022 CLINICAL DATA:  Encephalopathy, meningitis and respiratory failure. Need for percutaneous gastrostomy tube for nutrition. Evaluation anatomy prior to potential attempted percutaneous gastrostomy tube placement. EXAM: CT ABDOMEN WITHOUT CONTRAST TECHNIQUE: Multidetector CT imaging of the abdomen was performed following the standard protocol without IV contrast. RADIATION DOSE REDUCTION: This exam  was performed according to the departmental dose-optimization program which includes automated exposure control, adjustment of the mA and/or kV according to patient size and/or use of iterative reconstruction technique. COMPARISON:  Prior CT of the abdomen and pelvis on 02/14/2022 FINDINGS: Lower chest: Minimal bibasilar atelectasis. Hepatobiliary: Gallstones again visible in the gallbladder lumen. No biliary ductal dilatation. No hepatic lesions identified by unenhanced CT. Pancreas: Unremarkable. No pancreatic ductal dilatation or surrounding inflammatory changes. Spleen: Normal in size without focal abnormality. Adrenals/Urinary Tract: Adrenal glands are unremarkable. Kidneys are normal, without renal calculi, focal lesion, or hydronephrosis. Stomach/Bowel: No hiatal hernia. A feeding tube extends to the region of the distal gastric antrum. The stomach is normally positioned. No interposition of the colon between the stomach and abdominal wall. The left lobe of the liver is interposed between the proximal stomach and the abdominal wall. The body and distal stomach  lie immediately deep to the abdominal wall. No evidence of bowel obstruction, ileus or free intraperitoneal air. Vascular/Lymphatic: Atherosclerosis of the abdominal aorta without evidence of aneurysm. No enlarged lymph nodes identified. Other: No abdominal wall hernia. No ascites or abnormal fluid collections. Musculoskeletal: No acute or significant osseous findings. IMPRESSION: 1. Normal gastric positioning. No anatomic contraindication to attempted percutaneous gastrostomy tube placement. 2. Current feeding tube extends to the region of the distal gastric antrum. 3. Atherosclerosis of the abdominal aorta without aneurysm. 4. Cholelithiasis. Electronically Signed   By: Aletta Edouard M.D.   On: 03/14/2022 14:32     Imaging independently reviewed in Epic.    [facility]  [facility] [phone] pager 03/16/2022, 8:03 AM

## 2022-03-16 NOTE — Progress Notes (Signed)
PROGRESS NOTE    Gabrielle Vincent  KYH:[id number] DOB: May 12, 1966 DOA: 02/14/2022 PCP: Merryl Hacker, No    Brief Narrative:  Gabrielle Vincent is a 56 y.o. female with history of allergies, obesity presented to hospital with complaint of severe low back pain radiating to her  lower extremity with hematuria and dysuria.  In the ED, patient was noted to be hyponatremic and hypokalemic with elevated anion gap of 19 and creatinine at 3.6.  She had leukocytosis with WBC at 21.3.  CT renal study did not show obstructive uropathy but hepatomegaly cholecystolithiasis.  Patient was then admitted hospital for acute or severe lower back pain with radiculopathy, acute kidney injury, UTI, elevated LFTs, electrolyte imbalances.    During hospitalization, patient had worsening confusion and encephalopathy and had difficulty with airway protection.  Patient was empirically started on antibiotics for meningitis and imaging guided lumbar puncture was performed which showed a purulent CSF consistent with bacterial meningitis.  MRI of the lumbar spine was then ordered and was noted to have multiple epidural abscesses and underwent decompressive laminectomies on 02/24/2022 and 03/01/2022.[name]  Patient also was noted to have acute herniated disc L3-L4 with lumbar radiculopathy.    Patient was initially extubated on 03/02/22 but on 03/07/2022 had to be reintubated. Hospital course was complicated by septic shock, central diabetes insipidus and recent prolonged intubation which required tracheostomy on 03/09/2022.  Patient had tolerated the trach collar for 48 hours and was transferred out of the ICU.  Assessment and plan  Principal Problem:   Abscess in epidural space of cervical spine Active Problems:   Class II obesity   Encephalopathy acute   Acute respiratory failure (HCC)   E. coli UTI   Bacterial meningitis   Diabetes insipidus ([place])   H/O excision of lamina of cervical vertebra for decompression of spinal cord   E coli  bacteremia   Pressure injury of skin   Acute metabolic encephalopathy Secondary to sepsis and infection.  Follows few commands.Continue supportive care.  E. coli complicated UTI with extensive epidural spinal abscess/Bacterial meningitis  Status post C3-T1 laminectomy, evacuation of abscess, thoracolumbar laminectomy/decompression by neurosurgery.  Currently being followed by neurosurgery and infectious disease.  On IV Rocephin IV twice daily.  Febrile with Tmax of 102.9 F with leukocytosis.Blood culture from 5/31 negative till date.  ID on board.  Repeat MRI of the cervical lumbar and thoracic spine has been ordered.  Seen by neurosurgery as well.   Shock Improved at this time.  On midodrine 20 mg 3 times daily.  Latest WBC at 12.3 from 13.1 < 11.9.   Acute hypoxic respiratory failure s/p tracheostomy Aspiration pneumonia Incidental finding of right upper lobe 2.2 mm spiculated appearing mass concerning for malignancy Spontaneous right pneumothorax-resolved Continue trach collar.  Being followed by PCCM.    Central diabetes insipidus Hyponatremia followed by hypernatremia. Patient received DDAVP during hospitalization.  This has been discontinued at this time.  Latest sodium of  162 from 157< 154 yesterday.  Free water flushes 150 mL every 2 hours.  We will add on D5 water 100 mL/h.  Closely monitor sodium levels at this time.  Acute kidney injury Improved at this time.  Latest creatinine of 1.0.   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  Will need to hold aspirin for PEG tube placement when appropriate.  Type 2 diabetes mellitus. -Newly diagnosed.  Hemoglobin A1c of 8.9.  Continue sliding scale and long-acting insulin at this  time.  Latest POC glucose of 102.  Nutrition.  Currently on cortrak tube tube nutrition.  IR has been consulted for PEG tube placement but due to ongoing fevers on hold.  Patient will need  to have aspirin on hold prior to intervention.  We will continue cortrak tube tube feeding in the meantime.  Rectal wound secondary to continuous incontinence and skin breakdown. Continue wound care.  Anemia of critical illness Latest hemoglobin of 9.5.  Will transfuse for hemoglobin less than 7.  Acute urinary retention.  Has failed voiding trial.  Continue Foley catheter  Pressure injury.  We will continue wound care. Pressure Injury 03/06/22 Anus Lower;Mid Deep Tissue Pressure Injury - Purple or maroon localized area of discolored intact skin or blood-filled blister due to damage of underlying soft tissue from pressure and/or shear. raised purple area at base of rect (Active)  03/06/22 1730  Location: Anus  Location Orientation: Lower;Mid  Staging: Deep Tissue Pressure Injury - Purple or maroon localized area of discolored intact skin or blood-filled blister due to damage of underlying soft tissue from pressure and/or shear.  Wound Description (Comments): raised purple area at base of rectum  Present on Admission: No     Pressure Injury 03/13/22 Heel Right Stage 1 -  Intact skin with non-blanchable redness of a localized area usually over a bony prominence. (Active)  03/13/22 0948  Location: Heel  Location Orientation: Right  Staging: Stage 1 -  Intact skin with non-blanchable redness of a localized area usually over a bony prominence.  Wound Description (Comments):   Present on Admission: No     Pressure Injury 03/13/22 Heel Left Deep Tissue Pressure Injury - Purple or maroon localized area of discolored intact skin or blood-filled blister due to damage of underlying soft tissue from pressure and/or shear. (Active)  03/13/22 0948  Location: Heel  Location Orientation: Left  Staging: Deep Tissue Pressure Injury - Purple or maroon localized area of discolored intact skin or blood-filled blister due to damage of underlying soft tissue from pressure and/or shear.  Wound Description  (Comments):   Present on Admission: No   Extreme debility, deconditioning Will need PT OT evaluation due to fever tachycardia on hold at this time.  Will likely need placement.       DVT prophylaxis: SCD's Start: 03/01/22 2003 Place TED hose Start: 03/01/22 0851 heparin injection 5,000 Units Start: 02/26/22 1400 Place and maintain sequential compression device Start: 02/15/22 1026   Code Status:     Code Status: Full Code  Disposition: Uncertain at this time, would benefit from LTAC.  We will get TOC referral.  Status is: Inpatient  Remains inpatient appropriate because: Pending clinical improvement, IV antibiotics, need for further imaging and work-up, plan for PEG tube placement in the future.   Family Communication:  I spoke with the patient's brother and daughter on the phone on 03/15/2022.  Consultants:  Infectious disease PCCM Neurosurgery  Procedures:  Intubation and mechanical ventilation Tracheostomy placement Cortrak tube placement Decompressive laminectomies on 02/24/2022 and 03/01/2022.  Antimicrobials:  Rocephin IV  Anti-infectives (From admission, onward)    Start     Dose/Rate Route Frequency Ordered Stop   03/02/22 0927  ceFAZolin (ANCEF) IVPB 2g/100 mL premix        2 g 200 mL/hr over 30 Minutes Intravenous Every 8 hours 03/02/22 0927 03/02/22 1820   02/26/22 1300  vancomycin (VANCOCIN) IVPB 1000 mg/200 mL premix  Status:  Discontinued        1,000 mg  200 mL/hr over 60 Minutes Intravenous Every 24 hours 02/25/22 1458 03/01/22 0953   02/25/22 1145  vancomycin (VANCOREADY) IVPB 1500 mg/300 mL        1,500 mg 150 mL/hr over 120 Minutes Intravenous  Once 02/25/22 1051 02/25/22 1507   02/20/22 2200  cefTRIAXone (ROCEPHIN) 2 g in sodium chloride 0.9 % 100 mL IVPB        2 g 200 mL/hr over 30 Minutes Intravenous Every 12 hours 02/20/22 1122     02/19/22 2200  cefTRIAXone (ROCEPHIN) 2 g in sodium chloride 0.9 % 100 mL IVPB  Status:  Discontinued        2  g 200 mL/hr over 30 Minutes Intravenous Every 12 hours 02/19/22 1246 02/19/22 1320   02/19/22 2200  ceFEPIme (MAXIPIME) 2 g in sodium chloride 0.9 % 100 mL IVPB  Status:  Discontinued        2 g 200 mL/hr over 30 Minutes Intravenous Every 12 hours 02/19/22 1320 02/20/22 1122   02/17/22 1030  acyclovir (ZOVIRAX) 550 mg in dextrose 5 % 250 mL IVPB  Status:  Discontinued        550 mg 261 mL/hr over 60 Minutes Intravenous Every 12 hours 02/17/22 0855 02/20/22 0848   02/17/22 1000  ceFAZolin (ANCEF) IVPB 2g/100 mL premix  Status:  Discontinued        2 g 200 mL/hr over 30 Minutes Intravenous Every 12 hours 02/16/22 1322 02/17/22 0859   02/17/22 1000  vancomycin (VANCOREADY) IVPB 1250 mg/250 mL  Status:  Discontinued        1,250 mg 166.7 mL/hr over 90 Minutes Intravenous Every 24 hours 02/17/22 0836 02/20/22 0927   02/17/22 1000  ceFEPIme (MAXIPIME) 2 g in sodium chloride 0.9 % 100 mL IVPB  Status:  Discontinued        2 g 200 mL/hr over 30 Minutes Intravenous Every 12 hours 02/17/22 0857 02/19/22 1246   02/17/22 0945  ampicillin (OMNIPEN) 2 g in sodium chloride 0.9 % 100 mL IVPB  Status:  Discontinued        2 g 300 mL/hr over 20 Minutes Intravenous Every 6 hours 02/17/22 0859 02/20/22 1122   02/17/22 0915  ceFEPIme (MAXIPIME) 2 g in sodium chloride 0.9 % 100 mL IVPB  Status:  Discontinued        2 g 200 mL/hr over 30 Minutes Intravenous Every 8 hours 02/17/22 0826 02/17/22 0857   02/17/22 0915  ampicillin (OMNIPEN) 2 g in sodium chloride 0.9 % 100 mL IVPB  Status:  Discontinued        2 g 300 mL/hr over 20 Minutes Intravenous Every 4 hours 02/17/22 0826 02/17/22 0859   02/16/22 1230  cefTRIAXone (ROCEPHIN) 2 g in sodium chloride 0.9 % 100 mL IVPB  Status:  Discontinued        2 g 200 mL/hr over 30 Minutes Intravenous Every 24 hours 02/16/22 0609 02/16/22 1322   02/16/22 0815  vancomycin (VANCOREADY) IVPB 2000 mg/400 mL        2,000 mg 200 mL/hr over 120 Minutes Intravenous  Once 02/16/22  0721 02/16/22 1200   02/15/22 1230  cefTRIAXone (ROCEPHIN) 1 g in sodium chloride 0.9 % 100 mL IVPB  Status:  Discontinued        1 g 200 mL/hr over 30 Minutes Intravenous Every 24 hours 02/14/22 1306 02/16/22 0609   02/15/22 1200  Urelle (URELLE/URISED) 81 MG tablet 81 mg  Status:  Discontinued  1 tablet Oral 3 times daily 02/15/22 1051 02/16/22 1411   02/14/22 1230  cefTRIAXone (ROCEPHIN) 1 g in sodium chloride 0.9 % 100 mL IVPB        1 g 200 mL/hr over 30 Minutes Intravenous  Once 02/14/22 1225 02/14/22 1318      Subjective:  Today, patient was seen and examined at bedside.  Temperature max of 102.5 F yesterday evening.  Still mildly tachycardic.  Patient unable to verbalize much due to tracheostomy.  Objective: Vitals:   03/16/22 0440 03/16/22 0500 03/16/22 0738 03/16/22 0847  BP:   130/78   Pulse:   (!) 115 (!) 113  Resp: (!) 28  (!) 24 18  Temp:   99.7 F (37.6 C)   TempSrc:   Oral   SpO2: 95%  95% 97%  Weight:  94.9 kg    Height:        Intake/Output Summary (Last 24 hours) at 03/16/2022 1326 Last data filed at 03/16/2022 1225 Gross per 24 hour  Intake --  Output 3325 ml  Net -3325 ml   Filed Weights   03/14/22 0500 03/15/22 0500 03/16/22 0500  Weight: 97.7 kg 95.1 kg 94.9 kg    Physical Examination: Body mass index is 35.91 kg/m.   General: Obese built, not in obvious distress, appears weak and deconditioned and febrile, cortrak tube tube in place HENT:   No scleral pallor or icterus noted. Oral mucosa is moist.  Tracheostomy in place. Chest:   Diminished breath sounds bilaterally.  Coarse breath sounds noted CVS: S1 &S2 heard. No murmur.  Regular rate and rhythm.  Tachycardia noted Abdomen: Soft, nontender, nondistended.  Bowel sounds are heard.   Extremities: No cyanosis, clubbing or edema.  Peripheral pulses are palpable. Psych: Alert, awake and oriented, normal mood CNS:  No cranial nerve deficits.  Generalized weakness of the extremities.  Able to  move left upper extremity but unable to move bilateral lower extremities. Skin: Warm and dry.  No rashes noted.  Data Reviewed:   CBC: Recent Labs  Lab 03/11/22 0129 03/12/22 0258 03/14/22 0358 03/15/22 0302 03/16/22 0352  WBC 16.8* 16.7* 11.9* 13.1* 12.3*  NEUTROABS  --   --  7.7  --   --   HGB 8.6* 8.5* 9.5* 9.9* 9.5*  HCT 27.5* 27.1* 31.5* 32.7* 32.3*  MCV 94.8 93.1 95.7 97.9 99.7  PLT 183 194 236 249 154    Basic Metabolic Panel: Recent Labs  Lab 03/11/22 0129 03/12/22 0258 03/14/22 0358 03/15/22 0302 03/16/22 0352 03/16/22 0914  NA 138 133* 154* 157* 162* 162*  K 3.4* 3.7 4.0 4.0 4.1  --   CL 109 104 119* 124* 127*  --   CO2 23 21* '25 24 27  '$ --   GLUCOSE 145* 166* 122* 130* 107*  --   BUN 21* 16 26* 38* 40*  --   CREATININE 0.91 0.77 0.87 1.05* 1.05*  --   CALCIUM 7.7* 7.9* 8.9 8.8* 8.6*  --   MG  --   --   --  2.4 2.9*  --     Liver Function Tests: Recent Labs  Lab 03/15/22 0302 03/16/22 0352  AST 23 21  ALT 15 16  ALKPHOS 117 105  BILITOT 0.6 0.4  PROT 7.6 7.3  ALBUMIN 2.4* 2.4*     Radiology Studies: CT ABDOMEN WO CONTRAST  Result Date: 03/14/2022 CLINICAL DATA:  Encephalopathy, meningitis and respiratory failure. Need for percutaneous gastrostomy tube for nutrition. Evaluation anatomy prior to  potential attempted percutaneous gastrostomy tube placement. EXAM: CT ABDOMEN WITHOUT CONTRAST TECHNIQUE: Multidetector CT imaging of the abdomen was performed following the standard protocol without IV contrast. RADIATION DOSE REDUCTION: This exam was performed according to the departmental dose-optimization program which includes automated exposure control, adjustment of the mA and/or kV according to patient size and/or use of iterative reconstruction technique. COMPARISON:  Prior CT of the abdomen and pelvis on 02/14/2022 FINDINGS: Lower chest: Minimal bibasilar atelectasis. Hepatobiliary: Gallstones again visible in the gallbladder lumen. No biliary ductal  dilatation. No hepatic lesions identified by unenhanced CT. Pancreas: Unremarkable. No pancreatic ductal dilatation or surrounding inflammatory changes. Spleen: Normal in size without focal abnormality. Adrenals/Urinary Tract: Adrenal glands are unremarkable. Kidneys are normal, without renal calculi, focal lesion, or hydronephrosis. Stomach/Bowel: No hiatal hernia. A feeding tube extends to the region of the distal gastric antrum. The stomach is normally positioned. No interposition of the colon between the stomach and abdominal wall. The left lobe of the liver is interposed between the proximal stomach and the abdominal wall. The body and distal stomach lie immediately deep to the abdominal wall. No evidence of bowel obstruction, ileus or free intraperitoneal air. Vascular/Lymphatic: Atherosclerosis of the abdominal aorta without evidence of aneurysm. No enlarged lymph nodes identified. Other: No abdominal wall hernia. No ascites or abnormal fluid collections. Musculoskeletal: No acute or significant osseous findings. IMPRESSION: 1. Normal gastric positioning. No anatomic contraindication to attempted percutaneous gastrostomy tube placement. 2. Current feeding tube extends to the region of the distal gastric antrum. 3. Atherosclerosis of the abdominal aorta without aneurysm. 4. Cholelithiasis. Electronically Signed   By: Aletta Edouard M.D.   On: 03/14/2022 14:32   MR CERVICAL SPINE W WO CONTRAST  Result Date: 03/16/2022 CLINICAL DATA:  56 year old female. 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 Meningitis. Quadriplegia. Status post 02/24/2022 C3 through T1 posterior decompression of epidural abscess, C3 through C7 posterior fusion. And status post 03/01/2022 posterior epidural abscess evacuation from T12 utilizing pediatric feeding tube assisted evacuation of frank pus and necrotic fatty tissue from the thoracolumbar epidural space. Persistent fever and leukocytosis now. EXAM: MRI  CERVICAL SPINE WITHOUT AND WITH CONTRAST TECHNIQUE: Multiplanar and multiecho pulse sequences of the cervical spine, to include the craniocervical junction and cervicothoracic junction, were obtained without and with intravenous contrast. CONTRAST:  9.85m GADAVIST GADOBUTROL 1 MMOL/ML IV SOLN COMPARISON:  Postoperative cervical spine MRI 03/07/2022. FINDINGS: Alignment: Stable straightening of cervical lordosis. No significant spondylolisthesis. Vertebrae: Posterior decompression from C3 through the superior T1 lamina. Bilateral posterior spinal fusion hardware with mild susceptibility artifact. No convincing marrow edema. Background bone marrow signal is within normal limits. Cord: Cervical spinal cord signal and morphology now appears normal. No epidural or intrathecal collection identified. Mild posterior dural thickening and enhancement suspected, no abnormal intradural enhancement. Posterior Fossa, vertebral arteries, paraspinal tissues: Cervicomedullary junction is within normal limits. Grossly stable visible brain parenchyma, conspicuous dilated perivascular spaces in the pons. No abnormal posterior fossa enhance min identified. Posterior decompression and fusion surgical changes with fairly simple appearing fluid collection in the laminectomy space, no associated mass effect on the thecal sac. No unexpected paraspinal soft tissue inflammation. Partially visible tracheostomy tube at the thoracic inlet. Partially visible enteric tube coursing into the esophagus. Disc levels: C2-C3 and C3-C4 levels appear stable, negative. C4-C5 through C6-C7. Subtle increased T2 and STIR signal within the disc spaces, which appear chronically degenerated otherwise with disc space loss and circumferential disc osteophyte complex. Of these, there is postcontrast enhancement of  the C5-C6 disc space (series 28, image 9), which is stable or regressed since 03/07/2022. No convincing new endplate erosion there. Capacious cervical  spinal canal following decompression. C7-T1: Stable and negative. IMPRESSION: 1. Abnormal enhancement of the C5-C6 disc space strongly suggestive of Discitis, and additional trace fluid signal in the adjacent C4-C5 and C6-C7 discs. But no convincing progression since 03/07/2022 and no convincing endplate Osteomyelitis. 2. Satisfactory postoperative appearance of posterior decompression and fusion from C3 through C7. Midline postoperative seroma. Electronically Signed   By: Genevie Ann M.D.   On: 03/16/2022 11:44   MR THORACIC SPINE W WO CONTRAST  Addendum Date: 03/16/2022   ADDENDUM REPORT: 03/16/2022 12:50 ADDENDUM: After reviewing the lumbar images today, it is apparent that a ventral thoracic epidural or subdural collection extends from approximately T10-T11 caudally to the L1 level. This measures up to 6 mm in thickness and dorsally displaces the lower thoracic cord. See series 15, image 39 and series 12 image 6. But there is very little associated meningeal thickening and enhancement. And the fluid within the collection is near CSF intensity. CONCLUSION: CONCLUSION Additional Ventral Thoracic Epidural Or Subdural Fluid Collection tracking from T10 through L1 measures up to 6 mm in thickness and dorsally displaces the lower thoracic spinal cord. This is most apparent on series 12, image 6, series 15, image 39, and on the sagittal Lumbar MRI today which is reported separately. This and the other Cervical, Thoracic, and Lumbar MRI findings today were discussed by telephone with Dr. Kristeen Miss on 03/16/2022 at 12:46. Electronically Signed   By: Genevie Ann M.D.   On: 03/16/2022 12:50   Result Date: 03/16/2022 CLINICAL DATA:  56 year old female. 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 Meningitis. Quadriplegia. Status post 02/24/2022 C3 through T1 posterior decompression of epidural abscess, C3 through C7 posterior fusion. And status post 03/01/2022 posterior epidural abscess evacuation from  T12 utilizing pediatric feeding tube assisted evacuation of frank pus and necrotic fatty tissue from the thoracolumbar epidural space. Persistent fever and leukocytosis now. EXAM: MRI THORACIC WITHOUT AND WITH CONTRAST TECHNIQUE: Multiplanar and multiecho pulse sequences of the thoracic spine were obtained without and with intravenous contrast. CONTRAST:  9.35m GADAVIST GADOBUTROL 1 MMOL/ML IV SOLN in conjunction with contrast enhanced imaging of the cervical spine reported separately. COMPARISON:  Cervical spine MRI today reported separately. Postoperative thoracic MRI 03/06/2022. FINDINGS: Limited cervical spine imaging:  Reported separately today. Thoracic spine segmentation: Appears to be normal, same numbering system used previously. Alignment: Stable thoracic kyphosis. No significant spondylolisthesis. Vertebrae: Lower cervical posterior element fusion hardware. Fairly subtle postoperative changes to the posterior elements at T11 and T12. No marrow edema or evidence of acute osseous abnormality. Background bone marrow signal is within normal limits. Cord: Small foci of abnormal signal in the lateral thoracic upper thoracic cord at T2 and T3 (series 15, images 7 and 8). Relatively maintained thoracic cord volume. No associated abnormal cord enhancement. Distortion of the normal thoracic cord morphology in the mid to lower thoracic spine, most notable at T8-T9 (series 15, image 26) appears stable and may be due to arachnoid adhesions. No significant leptomeningeal enhancement at this level. But there is widespread dorsal thoracic mild dural thickening and enhancement. And beginning at the T11-T12 level there is either ventral leptomeningeal enhancement of the cord and/or ventral pachymeningeal enhancement which continues into the upper lumbar levels (series 26, images 6 and 7). No definite conus signal abnormality. However, there is a small T10-T11 thoracic cord syrinx (series  15, image 34). Substantially  improved thoracic dorsal epidural space from T10 inferiorly, but there do appear to be dorsal arachnoid adhesions and/or dorsal epidural granulation tissue remaining at T11-T12 (series 26, image 8). Trace postoperative seroma in the laminectomy space there. Lumbar levels are detailed separately today. Paraspinal and other soft tissues: Mild postoperative changes to the posterior thoracic paraspinal soft tissues. No paraspinal fluid collection. Grossly negative visible chest and upper abdominal viscera. Disc levels: No thoracic spinal stenosis now. Degenerative appearing right paracentral disc protrusion at T7-T8 is more apparent, but without significant stenosis. Similar left paracentral disc protrusion at T9-T10 appears stable. IMPRESSION: 1. Further improved appearance of the Thoracic Spine since 03/06/2022, but note: - abnormal upper thoracic lateral cord signal abnormality at T2 and T3, perhaps sequelae of small Cord Infarcts. 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 - continued abnormal dorsal thoracic dural thickening and enhancement, and evidence of additional pachymeningeal or leptomeningeal abnormality at the cauda equina and upper Lumbar Spine (detailed separately). 2. No thoracic discitis osteomyelitis. No thoracic spinal stenosis. Small degenerative disc herniations at T7-T8 and T9-8 T9. Electronically Signed: By: Genevie Ann M.D. On: 03/16/2022 12:04   MR Lumbar Spine W Wo Contrast  Result Date: 03/16/2022 CLINICAL DATA:  56 year old female. 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 Quadriplegia. Status post 02/24/2022 C3 through T1 posterior decompression of epidural abscess, C3 through C7 posterior fusion. And status post 03/01/2022 posterior epidural abscess evacuation from T12 utilizing pediatric feeding tube  assisted evacuation of frank pus and necrotic fatty tissue from the thoracolumbar epidural space. Persistent fever and leukocytosis now. EXAM: MRI LUMBAR SPINE WITHOUT AND WITH CONTRAST TECHNIQUE: Multiplanar and multiecho pulse sequences of the lumbar spine were obtained without and with intravenous contrast. CONTRAST:  9.52m GADAVIST GADOBUTROL 1 MMOL/ML IV SOLN in conjunction with contrast enhanced imaging of the cervical and thoracic reported separately. COMPARISON:  Thoracic MRI today. Prior lumbar MRI 02/23/2022 and earlier. FINDINGS: Segmentation: Transitional anatomy, lumbarized S1 level which is the same numbering system used on 02/23/2022. Correlation with radiographs is recommended prior to any operative intervention. Alignment: Stable lumbar lordosis. No significant spondylolisthesis. Vertebrae: Dense marrow edema and enhancement now in the L5 spinous process (series 24, image 8) with moderate to extensive heterogeneous marrow edema also in the bilateral L5 facets, pedicles, S1 superior articulating facets, and involving the posterior L5 vertebral body left greater than right. See series 24 images 5 and 11. Bilateral L5 transverse process is also affected. But no L5 endplate erosion. No other S1 or visible sacral marrow edema or enhancement. No L1 through L4 marrow edema or enhancement. Conus medullaris and cauda equina: Conus extends to the L1-L2 level. There is extensive abnormal intrathecal enhancement throughout the lumbar spine, beginning from the dorsal thoracic dural thickening and enhancement, becoming circumferential in the upper lumbar spine (series 25, image 7). An although there is abnormal thickening of the cauda equina nerve roots, there is only subtle associated leptomeningeal enhancement in the lumbar spine. However, there are several small areas of loculated fluid in the cauda equina, most notably at the ventral L2-L3 level (along a segment of 19 mm series 19, image 8 and series 22,  image 8). See also the left lateral recess of L3 on series 22, image 14, central L4 thecal sac on image 20. Ongoing inflammation throughout the lumbar epidural space,  but no other discrete intraspinal fluid collection. However, it is apparent on these images if there is an abnormal ventral thoracic fluid collection tracking inferiorly to the T12-L1 level. See series 20, image 8. Paraspinal and other soft tissues: Extensive lumbar erector spinae muscle edema, most pronounced at the L5-S1 posterior elements. No discrete paraspinal abscess. Grossly negative visible abdominal viscera. Partially visible lobulated fibroid uterus. Disc levels: Lumbar disc spaces do not appear infected. And there is relatively mild disc disease. There is a broad-based right foraminal disc herniation at L3-L4 (series 22, image 17 and series 19, image 6) with moderate right L3 foraminal stenosis. There is a small S1-S2 posterior disc protrusion and annular fissure. IMPRESSION: 1. 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 This is likely the sequelae of bilateral septic facets at that level. Associated lumbar erector spinae muscle edema, but no discrete paraspinal abscess. 2. The lumbar thecal sac remains highly abnormal, with diffuse pachymeningeal thickening and enhancement, diffuse thickening of the cauda equina nerve roots, and several small loculated fluid collections amongst the cauda equina, suspicious for residual small intrathecal abscesses. 3. No convincing lumbar discitis at this time. Electronically Signed   By: Genevie Ann M.D.   On: 03/16/2022 12:21      LOS: 30 days    Flora Lipps, MD [facility] Available via Epic secure chat 7am-7pm After these hours, please refer to coverage provider listed on [email or web address] 03/16/2022, 1:26 PM

## 2022-03-16 NOTE — Progress Notes (Signed)
PT Cancellation Note  Patient Details Name: Gabrielle Vincent MRN: [id number] DOB: 07-Dec-1965   Cancelled Treatment:    Reason Eval/Treat Not Completed: (P) Patient declined, no reason specified Pt just returned from extensive MRI session and requests no therapy today. Air mattress not in place, PT requested RN look into air mattress orders. PT will follow back on Monday.  Madellyn Denio B. Migdalia Dk PT, DPT Acute Rehabilitation Services Please use secure chat or  Call Office [phone]   [place] 03/16/2022, 12:03 PM

## 2022-03-16 NOTE — Telephone Encounter (Signed)
Dr.Chand has completed the FMLA form - I called Baird Kay and patient's brother, Cobi Aldape, will come to the office today to pick the form up at the front desk.  I notified Mercer Pod to drop the $29 fee to the account.  Family will take care of getting form submitted to patient's employer.

## 2022-03-16 NOTE — TOC Progression Note (Signed)
Transition of Care [facility]) - Progression Note    Patient Details  Name: Gabrielle Vincent MRN: [id number] Date of Birth: 1966-04-19  Transition of Care [facility] CM/SW [place], [place] Phone Number: 03/16/2022, 3:21 PM  Clinical Narrative:   CSW acknowledging consult for LTACH placement. CSW reached out to sister, Crystal, to discuss LTACH and answer questions. CSW provided choice, Crystal agreeable to looking into [facility]. CSW notified [facility] liaison. CSW to follow.    Expected Discharge Plan: Long Term Acute Care (LTAC) Barriers to Discharge: Continued Medical Work up, Orthoptist and Services Expected Discharge Plan: [place] (LTAC)                                               Social Determinants of Health (SDOH) Interventions    Readmission Risk Interventions     View : No data to display.

## 2022-03-16 NOTE — Progress Notes (Signed)
Patient ID: Gabrielle Vincent, female   DOB: [birth year], 56 y.o.   MRN: [id number] I have reviewed the MRIs of the cervical thoracic and lumbar spines that have been completed today with Dr. Lars Pinks and have viewed the images myself after our lengthy discussion of the findings cervical spinal canal appears well decompressed as does the upper thoracic spinal canal dural thickening is still noted throughout the dorsal spine and the ventral epidural or possibly subdural collection is noted displacing the thoracic cord slightly dorsally but without any overt compression.  Dural thickening is noted down into the lumbar spine and there is some evidence of what appears to be osteomyelitis in the posterior elements at L5 and L6 and into the facet joints.  There is no clear-cut collection of pus though in this area that would suggest need for drainage.  Patient has been on continued antibiotics with ongoing fevers present.  In the cervical spine there is also disc enhancement at the C5-6 disc which was noted previously this likely represents a discitis but is not evolved to any significant degree.  Changes in the facet joint and the spinous processes were noted early on by Dr. Jeralyn Ruths and one of the earlier scans but this has evolved now involve the spinous process and the posterior elements.  I am not certain the drainage of this would help the situation but is something that can certainly be discussed.  It certainly does not represent any abscess formation.  I will continue to follow along with you.

## 2022-03-16 NOTE — Progress Notes (Signed)
NAME:  Lashann Hagg, MRN:  [id number], DOB:  [birth year], LOS: 3 ADMISSION DATE:  02/14/2022, CONSULTATION DATE:  02/16/2022 REFERRING MD:  Tana Coast - [facility] CHIEF COMPLAINT:  Acute encephalopathy and sepsis    History of Present Illness:  56 year old woman initially admitted 5/3 for severe back pain radiating down her leg with associated hematuria/dysuria.  Prolonged hospital course; eventually found to have new AKI with E. Coli urosepsis and bacterial meningitis secondary to multiple epidural abscesses (s/p decompressive laminectomies). Patient was also noted to have acute herniated disc (L3-L4) with L lumbar radiculopathy. Hospital course c/b septic shock, central DI and prolonged intubation (now s/p tracheostomy 5/26).  Pertinent Medical History:  Class II obesity, hepatic steatosis, seasonal allergies, varicose veins diverticulosis, HL   Significant Hospital Events: Including procedures, antibiotic start and stop dates in addition to other pertinent events   5/3 admitted with back pain and urinary tract infection , Further complicated by AKI hyponatremia and multiple metabolic derangements.   CT imaging for renal stones showed no acute abdominal pelvic findings there was no obstructive uropathy there was severe fatty liver disease there was cholelithiasis but no cholecystitis there is colonic diverticulosis but no diverticulitis, mild circumferential thickening of the esophagus globular enlargement of the uterus with radiology recommending nonemergent pelvic ultrasound found to have uterine fibroid abdominal Ultrasound showed fatty liver but was epididymides negative.   MRI of the lumbar spine showed small left subarticular disc extrusion with inferior migration at L3-L4 correlating with radiculopathy.  She was started on ceftriaxone cultures were sent 5/4 increased confusion CT brain obtained raising concern for possible acute infarct in the right basal ganglia 5/5 progressive encephalopathy ,  Worsening leukocytosis, hypernatremia, hyperchloremia, slowly improving renal function, slowly improving procalcitonin, moved to ICU due to delirium, intubated for airway protection and to facilitate MRI imaging right IJ central line placed due to limited IV access.  Seen by neurology, also seen by infectious disease with ceftriaxone changed to cefazolin 5/6 antibiotics changed to meningitis coverage given MRI findings of fluid in the ventricles.  Image guided LP ordered 5/8 LP done with purulent CSF, studies consistent with bacterial meningitis, culture growing gm + cocci 5/10 significant rise in sodium with massive urine output of greater than 7 L leading to concern for development of central DI.  DDAVP and volume resuscitation started 5/11 hypernatremia slowly downtrending, continue DDAVP and IV resuscitation per nephrology 5/12 Sodium downtrended to 147 this am 5/13 to OR for posterior cervical decompressive laminectomy and medial facetectomy C3-T1 for evacuation of epirudal abscess, posterior cervical arthrodesis C3-C7, segmental fixation C3-7 5/18 OR for thoracolumbar laminectomy and decompression of epidural abscess from T-L spine 5/19 extubated to [facility] 5/24 reintubated 5/25 chest tube. MRI with ongoing meningitis + new area of ischemia in central pons 5/26 Tracheostomy created. 5/30 Tolerating TCT x 48H. Ongoing swallow dysfunction, SLP following. Unable to tolerate PMV. Likely will need PEG. WOC assessed rectal wound.  Interim History / Subjective:  On trach collar appears to be tolerating this well Still with thick secretions Transferred to floor 03/13/2022  Objective:  Blood pressure 130/78, pulse (!) 113, temperature 99.7 F (37.6 C), temperature source Oral, resp. rate 18, height '5\' 4"'$  (1.626 m), weight 94.9 kg, SpO2 97 %.    FiO2 (%):  [28 %-35 %] 28 %   Intake/Output Summary (Last 24 hours) at 03/16/2022 1037 Last data filed at 03/16/2022 0441 Gross per 24 hour  Intake --  Output  2700 ml  Net -2700 ml   Danley Danker  Weights   03/14/22 0500 03/15/22 0500 03/16/22 0500  Weight: 97.7 kg 95.1 kg 94.9 kg   Physical Examination: General: Chronically ill-appearing  HEENT: Moist oral mucosa, Shiley #6 cuffed trach in place  Neuro: Awake and responsive CV: S1-S2 appreciated coarse breath sounds PULM: Coarse breath sounds, rhonchi GI: Soft, nontender Extremities: Bilateral lower extremity edema Skin: Warm/dry, no rashes. Rectal wound as described by WOC (see Media tab).  Resolved medical problems  AKI due to septic ATN, resolved NAGMA - resolved  Anasarca with hypervolemia Shock liver superimposed on known fatty liver disease Septic shock  Assessment & Plan:  Principal Problem:   Abscess in epidural space of cervical spine Active Problems:   Class II obesity   Encephalopathy acute   Acute respiratory failure (HCC)   E. 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 -Continue  aspirin -Continue secondary stroke prophylaxis  Type 2 diabetes -On Lantus -Continue SSI  At risk for malnutrition Deconditioning -Dietitian following -Patient will likely need PEG tube placement -PT/OT/SLP as tolerated  Rectal wound secondary to continuous incontinence and skin breakdown -WOC following  Anemia of critical illness -Trend H&H -Transfusion per protocol  Urinary retention Could not void -Foley in place  Best Practice (right click and "Reselect all SmartList Selections" daily)   Diet/type: TF DVT prophylaxis: prophylactic heparin  GI prophylaxis: PPI Lines: NA Foley:  Yes, and it is still needed- DI Code Status:  full code Last date of multidisciplinary goals of care discussion: 5/25 Spoke with patient brother and 2 sisters, decision was to proceed with tracheostomy and continue full scope of care  Sherrilyn Rist, MD [facility] Pager: See Shea Evans

## 2022-03-17 DIAGNOSIS — E870 Hyperosmolality and hypernatremia: Secondary | ICD-10-CM

## 2022-03-17 DIAGNOSIS — M545 Low back pain, unspecified: Secondary | ICD-10-CM | POA: Diagnosis not present

## 2022-03-17 DIAGNOSIS — J9601 Acute respiratory failure with hypoxia: Secondary | ICD-10-CM | POA: Diagnosis not present

## 2022-03-17 DIAGNOSIS — G061 Intraspinal abscess and granuloma: Secondary | ICD-10-CM | POA: Diagnosis not present

## 2022-03-17 DIAGNOSIS — G009 Bacterial meningitis, unspecified: Secondary | ICD-10-CM | POA: Diagnosis not present

## 2022-03-17 LAB — GLUCOSE, CAPILLARY
Glucose-Capillary: 126 mg/dL — ABNORMAL HIGH (ref 70–99)
Glucose-Capillary: 116 mg/dL — ABNORMAL HIGH (ref 70–99)
Glucose-Capillary: 174 mg/dL — ABNORMAL HIGH (ref 70–99)
Glucose-Capillary: 92 mg/dL (ref 70–99)
Glucose-Capillary: 152 mg/dL — ABNORMAL HIGH (ref 70–99)
Glucose-Capillary: 208 mg/dL — ABNORMAL HIGH (ref 70–99)

## 2022-03-17 LAB — CBC
HCT: 30.2 % — ABNORMAL LOW (ref 36.0–46.0)
Hemoglobin: 8.8 g/dL — ABNORMAL LOW (ref 12.0–15.0)
MCH: 29.1 pg (ref 26.0–34.0)
MCHC: 29.1 g/dL — ABNORMAL LOW (ref 30.0–36.0)
MCV: 100 fL (ref 80.0–100.0)
Platelets: 236 10*3/uL (ref 150–400)
RBC: 3.02 MIL/uL — ABNORMAL LOW (ref 3.87–5.11)
RDW: 19.1 % — ABNORMAL HIGH (ref 11.5–15.5)
WBC: 12 10*3/uL — ABNORMAL HIGH (ref 4.0–10.5)
nRBC: 0.7 % — ABNORMAL HIGH (ref 0.0–0.2)

## 2022-03-17 LAB — COMPREHENSIVE METABOLIC PANEL
ALT: 17 U/L (ref 0–44)
AST: 23 U/L (ref 15–41)
Albumin: 2.3 g/dL — ABNORMAL LOW (ref 3.5–5.0)
Alkaline Phosphatase: 95 U/L (ref 38–126)
Anion gap: 8 (ref 5–15)
BUN: 44 mg/dL — ABNORMAL HIGH (ref 6–20)
CO2: 25 mmol/L (ref 22–32)
Calcium: 8.2 mg/dL — ABNORMAL LOW (ref 8.9–10.3)
Chloride: 122 mmol/L — ABNORMAL HIGH (ref 98–111)
Creatinine, Ser: 0.94 mg/dL (ref 0.44–1.00)
GFR, Estimated: >60 mL/min (ref >60)
Glucose, Bld: 224 mg/dL — ABNORMAL HIGH (ref 70–99)
Potassium: 3.1 mmol/L — ABNORMAL LOW (ref 3.5–5.1)
Sodium: 155 mmol/L — ABNORMAL HIGH (ref 135–145)
Total Bilirubin: 0.3 mg/dL (ref 0.3–1.2)
Total Protein: 6.7 g/dL (ref 6.5–8.1)

## 2022-03-17 LAB — MAGNESIUM: Magnesium: 2.5 mg/dL — ABNORMAL HIGH (ref 1.7–2.4)

## 2022-03-17 MED ORDER — POTASSIUM CHLORIDE 10 MEQ/100ML IV SOLN
10.0000 meq | INTRAVENOUS | Status: AC
  Administered 2022-03-17 (×4): 10 meq via INTRAVENOUS
  Filled 2022-03-17 (×4): qty 100

## 2022-03-17 NOTE — Plan of Care (Signed)
  Problem: Health Behavior/Discharge Planning: Goal: Ability to manage health-related needs will improve Outcome: Progressing   

## 2022-03-17 NOTE — Progress Notes (Addendum)
PROGRESS NOTE    Gabrielle Vincent  JIR:[id number] DOB: 1965/10/28 DOA: 02/14/2022 PCP: Merryl Hacker, No    Brief Narrative:  Gabrielle Vincent is a 56 y.o. female with history of allergies, obesity presented to hospital with complaint of severe low back pain radiating to her  lower extremity with hematuria and dysuria.  In the ED, patient was noted to be hyponatremic and hypokalemic with elevated anion gap of 19 and creatinine at 3.6.  She had leukocytosis with WBC at 21.3.  CT renal study did not show obstructive uropathy but hepatomegaly cholecystolithiasis.  Patient was then admitted hospital for acute or severe lower back pain with radiculopathy, acute kidney injury, UTI, elevated LFTs, electrolyte imbalances.    During hospitalization, patient had worsening confusion and encephalopathy and had difficulty with airway protection.  Patient was empirically started on antibiotics for meningitis and imaging guided lumbar puncture was performed which showed a purulent CSF consistent with bacterial meningitis.  MRI of the lumbar spine was then ordered and was noted to have multiple epidural abscesses and underwent decompressive laminectomies on 02/24/2022 and 03/01/2022.[name]  Patient also was noted to have acute herniated disc L3-L4 with lumbar radiculopathy.    Patient was initially extubated on 03/02/22 but on 03/07/2022 had to be reintubated. Hospital course was complicated by septic shock, central diabetes insipidus and recent prolonged intubation which required tracheostomy on 03/09/2022.  Patient had tolerated the trach collar for 48 hours and was transferred out of the ICU.  Assessment and plan  Principal Problem:   Abscess in epidural space of cervical spine Active Problems:   Hypokalemia   Class II obesity   Encephalopathy acute   Acute respiratory failure (HCC)   E. coli UTI   Bacterial meningitis   Diabetes insipidus ([place])   H/O excision of lamina of cervical vertebra for decompression of spinal cord   E  coli bacteremia   Pressure injury of skin   Hypernatremia   Acute metabolic encephalopathy Secondary to sepsis and infection.  Improving  E. coli complicated UTI with extensive epidural spinal abscess/Bacterial meningitis  Status post C3-T1 laminectomy, evacuation of abscess, thoracolumbar laminectomy/decompression by neurosurgery. Neurosurgery and infectious disease female.  On IV Rocephin  twice daily.  Febrile with Tmax of 102.5 F with leukocytosis. Blood culture from 5/31 negative till date.  ID on board.  Repeat MRI of the cervical lumbar and thoracic spine was performed and was reviewed by neurosurgery on 03/16/2022.  We will follow neurosurgical recommendations.    Shock Resolved at this time.  On midodrine 20 mg 3 times daily.  Latest WBC at 12.0 from 12.3 <13.1 < 11.9.  Hypokalemia.  Potassium of 3.1 today.  We will replace with IV KCl 40 mEq.  Check BMP in AM.   Acute hypoxic respiratory failure s/p tracheostomy Aspiration pneumonia Incidental finding of right upper lobe 2.2 mm spiculated appearing mass concerning for malignancy Spontaneous right pneumothorax-resolved Continue trach collar.  Patient has been  followed by PCCM.    Central diabetes insipidus Hyponatremia followed by hypernatremia. Patient received DDAVP during hospitalization.  Latest sodium of  155  from 162<157< 154 yesterday.  On increased free water flushes 150 mL every 2 hours.  Continue D5 water 100 mL/h.  Closely monitor sodium levels at this time.  Acute kidney injury Improved at this time.  Latest creatinine of 0.9.   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  Will need to hold aspirin for PEG tube placement  when appropriate and when fever settle.  Type 2 diabetes mellitus. -Newly diagnosed.  Hemoglobin A1c of 8.9.  Continue sliding scale and long-acting insulin at this time.  Latest POC glucose of 116.  Nutrition.  Currently on  cortrak tube tube nutrition.  IR has been consulted for PEG tube placement but due to ongoing fevers on hold.  Patient will need to have aspirin on hold prior to intervention.  We will continue cortrak tube tube feeding in the meantime.  Rectal wound secondary to continuous incontinence and skin breakdown. Continue wound care.  Anemia of critical illness Latest hemoglobin of 8.8, will transfuse for hemoglobin less than 7.  Acute urinary retention.  Has failed voiding trial.  Continue Foley catheter  Pressure injury.  We will continue wound care. Pressure Injury 03/06/22 Anus Lower;Mid Deep Tissue Pressure Injury - Purple or maroon localized area of discolored intact skin or blood-filled blister due to damage of underlying soft tissue from pressure and/or shear. raised purple area at base of rect (Active)  03/06/22 1730  Location: Anus  Location Orientation: Lower;Mid  Staging: Deep Tissue Pressure Injury - Purple or maroon localized area of discolored intact skin or blood-filled blister due to damage of underlying soft tissue from pressure and/or shear.  Wound Description (Comments): raised purple area at base of rectum  Present on Admission: No     Pressure Injury 03/13/22 Heel Right Stage 1 -  Intact skin with non-blanchable redness of a localized area usually over a bony prominence. (Active)  03/13/22 0948  Location: Heel  Location Orientation: Right  Staging: Stage 1 -  Intact skin with non-blanchable redness of a localized area usually over a bony prominence.  Wound Description (Comments):   Present on Admission: No     Pressure Injury 03/13/22 Heel Left Deep Tissue Pressure Injury - Purple or maroon localized area of discolored intact skin or blood-filled blister due to damage of underlying soft tissue from pressure and/or shear. (Active)  03/13/22 0948  Location: Heel  Location Orientation: Left  Staging: Deep Tissue Pressure Injury - Purple or maroon localized area of discolored  intact skin or blood-filled blister due to damage of underlying soft tissue from pressure and/or shear.  Wound Description (Comments):   Present on Admission: No   Extreme debility, deconditioning We will assess with PT OT as able.  Will likely need rehabilitation.       DVT prophylaxis: SCD's Start: 03/01/22 2003 Place TED hose Start: 03/01/22 0851 heparin injection 5,000 Units Start: 02/26/22 1400 Place and maintain sequential compression device Start: 02/15/22 1026   Code Status:     Code Status: Full Code  Disposition: Uncertain at this time, would benefit from LTAC.   TOC on board.  Status is: Inpatient  Remains inpatient appropriate because: Pending clinical improvement, IV antibiotics, neurosurgery follow-up, plan for PEG tube placement in the future.   Family Communication:  I spoke with the patient's brother and daughter on the phone on 03/15/2022.  Consultants:  Infectious disease PCCM Neurosurgery  Procedures:  Intubation and mechanical ventilation Tracheostomy placement Cortrak tube placement Decompressive laminectomies on 02/24/2022 and 03/01/2022.  Antimicrobials:  Rocephin IV  Anti-infectives (From admission, onward)    Start     Dose/Rate Route Frequency Ordered Stop   03/02/22 0927  ceFAZolin (ANCEF) IVPB 2g/100 mL premix        2 g 200 mL/hr over 30 Minutes Intravenous Every 8 hours 03/02/22 0927 03/02/22 1820   02/26/22 1300  vancomycin (VANCOCIN) IVPB 1000 mg/200  mL premix  Status:  Discontinued        1,000 mg 200 mL/hr over 60 Minutes Intravenous Every 24 hours 02/25/22 1458 03/01/22 0953   02/25/22 1145  vancomycin (VANCOREADY) IVPB 1500 mg/300 mL        1,500 mg 150 mL/hr over 120 Minutes Intravenous  Once 02/25/22 1051 02/25/22 1507   02/20/22 2200  cefTRIAXone (ROCEPHIN) 2 g in sodium chloride 0.9 % 100 mL IVPB        2 g 200 mL/hr over 30 Minutes Intravenous Every 12 hours 02/20/22 1122     02/19/22 2200  cefTRIAXone (ROCEPHIN) 2 g in  sodium chloride 0.9 % 100 mL IVPB  Status:  Discontinued        2 g 200 mL/hr over 30 Minutes Intravenous Every 12 hours 02/19/22 1246 02/19/22 1320   02/19/22 2200  ceFEPIme (MAXIPIME) 2 g in sodium chloride 0.9 % 100 mL IVPB  Status:  Discontinued        2 g 200 mL/hr over 30 Minutes Intravenous Every 12 hours 02/19/22 1320 02/20/22 1122   02/17/22 1030  acyclovir (ZOVIRAX) 550 mg in dextrose 5 % 250 mL IVPB  Status:  Discontinued        550 mg 261 mL/hr over 60 Minutes Intravenous Every 12 hours 02/17/22 0855 02/20/22 0848   02/17/22 1000  ceFAZolin (ANCEF) IVPB 2g/100 mL premix  Status:  Discontinued        2 g 200 mL/hr over 30 Minutes Intravenous Every 12 hours 02/16/22 1322 02/17/22 0859   02/17/22 1000  vancomycin (VANCOREADY) IVPB 1250 mg/250 mL  Status:  Discontinued        1,250 mg 166.7 mL/hr over 90 Minutes Intravenous Every 24 hours 02/17/22 0836 02/20/22 0927   02/17/22 1000  ceFEPIme (MAXIPIME) 2 g in sodium chloride 0.9 % 100 mL IVPB  Status:  Discontinued        2 g 200 mL/hr over 30 Minutes Intravenous Every 12 hours 02/17/22 0857 02/19/22 1246   02/17/22 0945  ampicillin (OMNIPEN) 2 g in sodium chloride 0.9 % 100 mL IVPB  Status:  Discontinued        2 g 300 mL/hr over 20 Minutes Intravenous Every 6 hours 02/17/22 0859 02/20/22 1122   02/17/22 0915  ceFEPIme (MAXIPIME) 2 g in sodium chloride 0.9 % 100 mL IVPB  Status:  Discontinued        2 g 200 mL/hr over 30 Minutes Intravenous Every 8 hours 02/17/22 0826 02/17/22 0857   02/17/22 0915  ampicillin (OMNIPEN) 2 g in sodium chloride 0.9 % 100 mL IVPB  Status:  Discontinued        2 g 300 mL/hr over 20 Minutes Intravenous Every 4 hours 02/17/22 0826 02/17/22 0859   02/16/22 1230  cefTRIAXone (ROCEPHIN) 2 g in sodium chloride 0.9 % 100 mL IVPB  Status:  Discontinued        2 g 200 mL/hr over 30 Minutes Intravenous Every 24 hours 02/16/22 0609 02/16/22 1322   02/16/22 0815  vancomycin (VANCOREADY) IVPB 2000 mg/400 mL         2,000 mg 200 mL/hr over 120 Minutes Intravenous  Once 02/16/22 0721 02/16/22 1200   02/15/22 1230  cefTRIAXone (ROCEPHIN) 1 g in sodium chloride 0.9 % 100 mL IVPB  Status:  Discontinued        1 g 200 mL/hr over 30 Minutes Intravenous Every 24 hours 02/14/22 1306 02/16/22 0609   02/15/22 1200  Urelle (URELLE/URISED)  81 MG tablet 81 mg  Status:  Discontinued        1 tablet Oral 3 times daily 02/15/22 1051 02/16/22 1411   02/14/22 1230  cefTRIAXone (ROCEPHIN) 1 g in sodium chloride 0.9 % 100 mL IVPB        1 g 200 mL/hr over 30 Minutes Intravenous  Once 02/14/22 1225 02/14/22 1318      Subjective:  Today, patient was seen and examined at bedside.  Alert awake and moving upper extremities.  Unable to verbalize much due to tracheostomy.  Objective: Vitals:   03/17/22 0231 03/17/22 0325 03/17/22 0332 03/17/22 0724  BP:  133/66    Pulse: 95  94 86  Resp:  19 16   Temp: 99 F (37.2 C) 99.5 F (37.5 C)  98.8 F (37.1 C)  TempSrc: Oral Axillary  Oral  SpO2:   100% 99%  Weight:      Height:        Intake/Output Summary (Last 24 hours) at 03/17/2022 0939 Last data filed at 03/17/2022 0606 Gross per 24 hour  Intake 2093.53 ml  Output 3325 ml  Net -1231.47 ml   Filed Weights   03/14/22 0500 03/15/22 0500 03/16/22 0500  Weight: 97.7 kg 95.1 kg 94.9 kg    Physical Examination: Body mass index is 35.91 kg/m.   General: Obese built, not in obvious distress, weak and deconditioned, cortrak tube tube in place HENT:   No scleral pallor or icterus noted. Oral mucosa is moist.  Tracheostomy tube in place Chest:  Diminished breath sounds bilaterally.  Coarse breath sounds noted CVS: S1 &S2 heard. No murmur.  Regular rate and rhythm. Abdomen: Soft, nontender, nondistended.  Bowel sounds are heard.   Extremities: No cyanosis, clubbing or edema.  Peripheral pulses are palpable. Psych: Alert, awake and oriented, normal mood CNS:  No cranial nerve deficits.  Moves upper extremities  against gravity, unable to move bilateral lower extremities. Skin: Warm and dry.    Data Reviewed:   CBC: Recent Labs  Lab 03/12/22 0258 03/14/22 0358 03/15/22 0302 03/16/22 0352 03/17/22 0108  WBC 16.7* 11.9* 13.1* 12.3* 12.0*  NEUTROABS  --  7.7  --   --   --   HGB 8.5* 9.5* 9.9* 9.5* 8.8*  HCT 27.1* 31.5* 32.7* 32.3* 30.2*  MCV 93.1 95.7 97.9 99.7 100.0  PLT 194 236 249 233 268    Basic Metabolic Panel: Recent Labs  Lab 03/12/22 0258 03/14/22 0358 03/15/22 0302 03/16/22 0352 03/16/22 0914 03/17/22 0108  NA 133* 154* 157* 162* 162* 155*  K 3.7 4.0 4.0 4.1  --  3.1*  CL 104 119* 124* 127*  --  122*  CO2 21* '25 24 27  '$ --  25  GLUCOSE 166* 122* 130* 107*  --  224*  BUN 16 26* 38* 40*  --  44*  CREATININE 0.77 0.87 1.05* 1.05*  --  0.94  CALCIUM 7.9* 8.9 8.8* 8.6*  --  8.2*  MG  --   --  2.4 2.9*  --  2.5*    Liver Function Tests: Recent Labs  Lab 03/15/22 0302 03/16/22 0352 03/17/22 0108  AST '23 21 23  '$ ALT '15 16 17  '$ ALKPHOS 117 105 95  BILITOT 0.6 0.4 0.3  PROT 7.6 7.3 6.7  ALBUMIN 2.4* 2.4* 2.3*     Radiology Studies: MR CERVICAL SPINE W WO CONTRAST  Result Date: 03/16/2022 CLINICAL DATA:  56 year old female. 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 Meningitis.  Quadriplegia. Status post 02/24/2022 C3 through T1 posterior decompression of epidural abscess, C3 through C7 posterior fusion. And status post 03/01/2022 posterior epidural abscess evacuation from T12 utilizing pediatric feeding tube assisted evacuation of frank pus and necrotic fatty tissue from the thoracolumbar epidural space. Persistent fever and leukocytosis now. EXAM: MRI CERVICAL SPINE WITHOUT AND WITH CONTRAST TECHNIQUE: Multiplanar and multiecho pulse sequences of the cervical spine, to include the craniocervical junction and cervicothoracic junction, were obtained without and with intravenous contrast. CONTRAST:  9.3m GADAVIST GADOBUTROL 1 MMOL/ML IV SOLN  COMPARISON:  Postoperative cervical spine MRI 03/07/2022. FINDINGS: Alignment: Stable straightening of cervical lordosis. No significant spondylolisthesis. Vertebrae: Posterior decompression from C3 through the superior T1 lamina. Bilateral posterior spinal fusion hardware with mild susceptibility artifact. No convincing marrow edema. Background bone marrow signal is within normal limits. Cord: Cervical spinal cord signal and morphology now appears normal. No epidural or intrathecal collection identified. Mild posterior dural thickening and enhancement suspected, no abnormal intradural enhancement. Posterior Fossa, vertebral arteries, paraspinal tissues: Cervicomedullary junction is within normal limits. Grossly stable visible brain parenchyma, conspicuous dilated perivascular spaces in the pons. No abnormal posterior fossa enhance min identified. Posterior decompression and fusion surgical changes with fairly simple appearing fluid collection in the laminectomy space, no associated mass effect on the thecal sac. No unexpected paraspinal soft tissue inflammation. Partially visible tracheostomy tube at the thoracic inlet. Partially visible enteric tube coursing into the esophagus. Disc levels: C2-C3 and C3-C4 levels appear stable, negative. C4-C5 through C6-C7. Subtle increased T2 and STIR signal within the disc spaces, which appear chronically degenerated otherwise with disc space loss and circumferential disc osteophyte complex. Of these, there is postcontrast enhancement of the C5-C6 disc space (series 28, image 9), which is stable or regressed since 03/07/2022. No convincing new endplate erosion there. Capacious cervical spinal canal following decompression. C7-T1: Stable and negative. IMPRESSION: 1. Abnormal enhancement of the C5-C6 disc space strongly suggestive of Discitis, and additional trace fluid signal in the adjacent C4-C5 and C6-C7 discs. But no convincing progression since 03/07/2022 and no convincing  endplate Osteomyelitis. 2. Satisfactory postoperative appearance of posterior decompression and fusion from C3 through C7. Midline postoperative seroma. Electronically Signed   By: HGenevie AnnM.D.   On: 03/16/2022 11:44   MR THORACIC SPINE W WO CONTRAST  Addendum Date: 03/16/2022   ADDENDUM REPORT: 03/16/2022 12:50 ADDENDUM: After reviewing the lumbar images today, it is apparent that a ventral thoracic epidural or subdural collection extends from approximately T10-T11 caudally to the L1 level. This measures up to 6 mm in thickness and dorsally displaces the lower thoracic cord. See series 15, image 39 and series 12 image 6. But there is very little associated meningeal thickening and enhancement. And the fluid within the collection is near CSF intensity. CONCLUSION: CONCLUSION Additional Ventral Thoracic Epidural Or Subdural Fluid Collection tracking from T10 through L1 measures up to 6 mm in thickness and dorsally displaces the lower thoracic spinal cord. This is most apparent on series 12, image 6, series 15, image 39, and on the sagittal Lumbar MRI today which is reported separately. This and the other Cervical, Thoracic, and Lumbar MRI findings today were discussed by telephone with Dr. HKristeen Misson 03/16/2022 at 12:46. Electronically Signed   By: HGenevie AnnM.D.   On: 03/16/2022 12:50   Result Date: 03/16/2022 CLINICAL DATA:  56year old female. 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 Meningitis. Quadriplegia. Status post 02/24/2022 C3 through T1 posterior decompression of epidural abscess, C3  through C7 posterior fusion. And status post 03/01/2022 posterior epidural abscess evacuation from T12 utilizing pediatric feeding tube assisted evacuation of frank pus and necrotic fatty tissue from the thoracolumbar epidural space. Persistent fever and leukocytosis now. EXAM: MRI THORACIC WITHOUT AND WITH CONTRAST TECHNIQUE: Multiplanar and multiecho pulse sequences of the thoracic spine were  obtained without and with intravenous contrast. CONTRAST:  9.75m GADAVIST GADOBUTROL 1 MMOL/ML IV SOLN in conjunction with contrast enhanced imaging of the cervical spine reported separately. COMPARISON:  Cervical spine MRI today reported separately. Postoperative thoracic MRI 03/06/2022. FINDINGS: Limited cervical spine imaging:  Reported separately today. Thoracic spine segmentation: Appears to be normal, same numbering system used previously. Alignment: Stable thoracic kyphosis. No significant spondylolisthesis. Vertebrae: Lower cervical posterior element fusion hardware. Fairly subtle postoperative changes to the posterior elements at T11 and T12. No marrow edema or evidence of acute osseous abnormality. Background bone marrow signal is within normal limits. Cord: Small foci of abnormal signal in the lateral thoracic upper thoracic cord at T2 and T3 (series 15, images 7 and 8). Relatively maintained thoracic cord volume. No associated abnormal cord enhancement. Distortion of the normal thoracic cord morphology in the mid to lower thoracic spine, most notable at T8-T9 (series 15, image 26) appears stable and may be due to arachnoid adhesions. No significant leptomeningeal enhancement at this level. But there is widespread dorsal thoracic mild dural thickening and enhancement. And beginning at the T11-T12 level there is either ventral leptomeningeal enhancement of the cord and/or ventral pachymeningeal enhancement which continues into the upper lumbar levels (series 26, images 6 and 7). No definite conus signal abnormality. However, there is a small T10-T11 thoracic cord syrinx (series 15, image 34). Substantially improved thoracic dorsal epidural space from T10 inferiorly, but there do appear to be dorsal arachnoid adhesions and/or dorsal epidural granulation tissue remaining at T11-T12 (series 26, image 8). Trace postoperative seroma in the laminectomy space there. Lumbar levels are detailed separately today.  Paraspinal and other soft tissues: Mild postoperative changes to the posterior thoracic paraspinal soft tissues. No paraspinal fluid collection. Grossly negative visible chest and upper abdominal viscera. Disc levels: No thoracic spinal stenosis now. Degenerative appearing right paracentral disc protrusion at T7-T8 is more apparent, but without significant stenosis. Similar left paracentral disc protrusion at T9-T10 appears stable. IMPRESSION: 1. Further improved appearance of the Thoracic Spine since 03/06/2022, but note: - abnormal upper thoracic lateral cord signal abnormality at T2 and T3, perhaps sequelae of small Cord Infarcts. 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 - continued abnormal dorsal thoracic dural thickening and enhancement, and evidence of additional pachymeningeal or leptomeningeal abnormality at the cauda equina and upper Lumbar Spine (detailed separately). 2. No thoracic discitis osteomyelitis. No thoracic spinal stenosis. Small degenerative disc herniations at T7-T8 and T9-8 T9. Electronically Signed: By: HGenevie AnnM.D. On: 03/16/2022 12:04   MR Lumbar Spine W Wo Contrast  Result Date: 03/16/2022 CLINICAL DATA:  56year old female. 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 Quadriplegia. Status post 02/24/2022 C3 through T1 posterior decompression of epidural abscess, C3 through C7 posterior fusion. And status post 03/01/2022 posterior epidural abscess evacuation from T12 utilizing pediatric feeding tube assisted evacuation of frank pus and necrotic fatty tissue from the thoracolumbar epidural space. Persistent fever and leukocytosis now. EXAM: MRI LUMBAR SPINE WITHOUT AND WITH CONTRAST TECHNIQUE: Multiplanar and multiecho pulse  sequences of the lumbar spine were obtained without and with intravenous  contrast. CONTRAST:  9.64m GADAVIST GADOBUTROL 1 MMOL/ML IV SOLN in conjunction with contrast enhanced imaging of the cervical and thoracic reported separately. COMPARISON:  Thoracic MRI today. Prior lumbar MRI 02/23/2022 and earlier. FINDINGS: Segmentation: Transitional anatomy, lumbarized S1 level which is the same numbering system used on 02/23/2022. Correlation with radiographs is recommended prior to any operative intervention. Alignment: Stable lumbar lordosis. No significant spondylolisthesis. Vertebrae: Dense marrow edema and enhancement now in the L5 spinous process (series 24, image 8) with moderate to extensive heterogeneous marrow edema also in the bilateral L5 facets, pedicles, S1 superior articulating facets, and involving the posterior L5 vertebral body left greater than right. See series 24 images 5 and 11. Bilateral L5 transverse process is also affected. But no L5 endplate erosion. No other S1 or visible sacral marrow edema or enhancement. No L1 through L4 marrow edema or enhancement. Conus medullaris and cauda equina: Conus extends to the L1-L2 level. There is extensive abnormal intrathecal enhancement throughout the lumbar spine, beginning from the dorsal thoracic dural thickening and enhancement, becoming circumferential in the upper lumbar spine (series 25, image 7). An although there is abnormal thickening of the cauda equina nerve roots, there is only subtle associated leptomeningeal enhancement in the lumbar spine. However, there are several small areas of loculated fluid in the cauda equina, most notably at the ventral L2-L3 level (along a segment of 19 mm series 19, image 8 and series 22, image 8). See also the left lateral recess of L3 on series 22, image 14, central L4 thecal sac on image 20. Ongoing inflammation throughout the lumbar epidural space, but no other discrete intraspinal fluid collection. However, it is apparent on these images if there is an abnormal ventral thoracic fluid  collection tracking inferiorly to the T12-L1 level. See series 20, image 8. Paraspinal and other soft tissues: Extensive lumbar erector spinae muscle edema, most pronounced at the L5-S1 posterior elements. No discrete paraspinal abscess. Grossly negative visible abdominal viscera. Partially visible lobulated fibroid uterus. Disc levels: Lumbar disc spaces do not appear infected. And there is relatively mild disc disease. There is a broad-based right foraminal disc herniation at L3-L4 (series 22, image 17 and series 19, image 6) with moderate right L3 foraminal stenosis. There is a small S1-S2 posterior disc protrusion and annular fissure. IMPRESSION: 1. 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 This is likely the sequelae of bilateral septic facets at that level. Associated lumbar erector spinae muscle edema, but no discrete paraspinal abscess. 2. The lumbar thecal sac remains highly abnormal, with diffuse pachymeningeal thickening and enhancement, diffuse thickening of the cauda equina nerve roots, and several small loculated fluid collections amongst the cauda equina, suspicious for residual small intrathecal abscesses. 3. No convincing lumbar discitis at this time. Electronically Signed   By: HGenevie AnnM.D.   On: 03/16/2022 12:21      LOS: 31 days    LFlora Lipps MD [facility] Available via Epic secure chat 7am-7pm After these hours, please refer to coverage provider listed on [email or web address] 03/17/2022, 9:39 AM

## 2022-03-17 NOTE — Progress Notes (Signed)
NEUROSURGERY PROGRESS NOTE  Doing ok, no acute events overnight. On trach collar. No movement in lower extremities but able to raise arms up against gravity   Temp:  [99 F (37.2 C)-102.5 F (39.2 C)] 99.5 F (37.5 C) (06/03 0325) Pulse Rate:  [94-124] 94 (06/03 0332) Resp:  [16-32] 16 (06/03 0332) BP: (115-140)/(66-76) 133/66 (06/03 0325) SpO2:  [96 %-100 %] 100 % (06/03 0332) FiO2 (%):  [28 %] 28 % (06/03 0332)    Gabrielle Chiquito, NP 03/17/2022 8:42 AM

## 2022-03-18 DIAGNOSIS — G009 Bacterial meningitis, unspecified: Secondary | ICD-10-CM | POA: Diagnosis not present

## 2022-03-18 DIAGNOSIS — M545 Low back pain, unspecified: Secondary | ICD-10-CM | POA: Diagnosis not present

## 2022-03-18 DIAGNOSIS — G061 Intraspinal abscess and granuloma: Secondary | ICD-10-CM | POA: Diagnosis not present

## 2022-03-18 DIAGNOSIS — J9601 Acute respiratory failure with hypoxia: Secondary | ICD-10-CM | POA: Diagnosis not present

## 2022-03-18 LAB — GLUCOSE, CAPILLARY
Glucose-Capillary: 104 mg/dL — ABNORMAL HIGH (ref 70–99)
Glucose-Capillary: 120 mg/dL — ABNORMAL HIGH (ref 70–99)
Glucose-Capillary: 129 mg/dL — ABNORMAL HIGH (ref 70–99)
Glucose-Capillary: 130 mg/dL — ABNORMAL HIGH (ref 70–99)
Glucose-Capillary: 160 mg/dL — ABNORMAL HIGH (ref 70–99)
Glucose-Capillary: 171 mg/dL — ABNORMAL HIGH (ref 70–99)

## 2022-03-18 LAB — BASIC METABOLIC PANEL
Anion gap: 7 (ref 5–15)
BUN: 33 mg/dL — ABNORMAL HIGH (ref 6–20)
CO2: 25 mmol/L (ref 22–32)
Calcium: 8.7 mg/dL — ABNORMAL LOW (ref 8.9–10.3)
Chloride: 119 mmol/L — ABNORMAL HIGH (ref 98–111)
Creatinine, Ser: 0.83 mg/dL (ref 0.44–1.00)
GFR, Estimated: >60 mL/min (ref >60)
Glucose, Bld: 137 mg/dL — ABNORMAL HIGH (ref 70–99)
Potassium: 4.5 mmol/L (ref 3.5–5.1)
Sodium: 151 mmol/L — ABNORMAL HIGH (ref 135–145)

## 2022-03-18 LAB — CBC
HCT: 30.9 % — ABNORMAL LOW (ref 36.0–46.0)
Hemoglobin: 9.2 g/dL — ABNORMAL LOW (ref 12.0–15.0)
MCH: 29.1 pg (ref 26.0–34.0)
MCHC: 29.8 g/dL — ABNORMAL LOW (ref 30.0–36.0)
MCV: 97.8 fL (ref 80.0–100.0)
Platelets: 254 10*3/uL (ref 150–400)
RBC: 3.16 MIL/uL — ABNORMAL LOW (ref 3.87–5.11)
RDW: 18.3 % — ABNORMAL HIGH (ref 11.5–15.5)
WBC: 10.1 10*3/uL (ref 4.0–10.5)
nRBC: 0.9 % — ABNORMAL HIGH (ref 0.0–0.2)

## 2022-03-18 LAB — MAGNESIUM: Magnesium: 2.5 mg/dL — ABNORMAL HIGH (ref 1.7–2.4)

## 2022-03-18 NOTE — Progress Notes (Addendum)
PROGRESS NOTE    Gabrielle Vincent  ZOX:[id number] DOB: 09-12-1966 DOA: 02/14/2022 PCP: Merryl Hacker, No    Brief Narrative:  Gabrielle Vincent is a 56 y.o. female with history of allergies, obesity presented to hospital with complaint of severe low back pain radiating to her  lower extremity with hematuria and dysuria.  In the ED, patient was noted to be hyponatremic and hypokalemic with elevated anion gap of 19 and creatinine at 3.6.  She had leukocytosis with WBC at 21.3.  CT renal study did not show obstructive uropathy but hepatomegaly cholecystolithiasis.  Patient was then admitted hospital for acute or severe lower back pain with radiculopathy, acute kidney injury, UTI, elevated LFTs, electrolyte imbalances.    During hospitalization, patient had worsening confusion and encephalopathy and had difficulty with airway protection.  Patient was empirically started on antibiotics for meningitis and imaging guided lumbar puncture was performed which showed a purulent CSF consistent with bacterial meningitis.  MRI of the lumbar spine was then ordered and was noted to have multiple epidural abscesses and underwent decompressive laminectomies on 02/24/2022 and 03/01/2022.  Patient also was noted to have acute herniated disc L3-L4 with lumbar radiculopathy.    Patient was initially extubated on 03/02/22 but on 03/07/2022 had to be reintubated. Hospital course was complicated by septic shock, central diabetes insipidus and recent prolonged intubation which required tracheostomy on 03/09/2022.  Patient had tolerated the trach collar for 48 hours and was transferred out of the ICU.  Assessment and plan  Principal Problem:   Abscess in epidural space of cervical spine Active Problems:   Hypokalemia   Class II obesity   Encephalopathy acute   Acute respiratory failure (HCC)   E. coli UTI   Bacterial meningitis   Diabetes insipidus ([place])   H/O excision of lamina of cervical vertebra for decompression of spinal cord   E  coli bacteremia   Pressure injury of skin   Hypernatremia   Acute metabolic encephalopathy Secondary to sepsis and infection.  Improving  E. coli complicated UTI with extensive epidural spinal abscess/Bacterial meningitis  Status post C3-T1 laminectomy, evacuation of abscess, thoracolumbar laminectomy/decompression by neurosurgery. Neurosurgery and infectious disease on board..  On IV Rocephin  twice daily.  Febrile with Tmax of 99.5 F improving leukocytosis at 10.1 today.  Blood culture from 5/31 negative till date.  ID on board.  Repeat MRI of the cervical lumbar and thoracic spine was performed and was reviewed by neurosurgery on 03/16/2022.  No plans for neurosurgical intervention at this time.  Shock Resolved at this time.  On midodrine 20 mg 3 times daily.  Latest WBC at 10.1 < 12.0< 12.3 <13.1 < 11.9.  Hypokalemia.  Improved after replacement.  Latest potassium 4.5.  Acute hypoxic respiratory failure s/p tracheostomy Aspiration pneumonia Incidental finding of right upper lobe 2.2 mm spiculated appearing mass concerning for malignancy Spontaneous right pneumothorax-resolved Continue trach collar.  Patient has been  followed by PCCM.    Central diabetes insipidus Hyponatremia followed by hypernatremia. Patient received DDAVP during hospitalization.  Latest sodium of  151 from 155<162<157< 154 yesterday.  On increased free water flushes 150 mL every 2 hours.  Continue D5 water 100 mL/h.  Closely monitor sodium levels at this time.  Acute kidney injury Improved at this time.  Latest creatinine of 0.8.   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  Will need to hold aspirin for PEG tube placement when appropriate and when fever settle.  Type 2 diabetes mellitus. -Newly diagnosed.  Hemoglobin A1c of 8.9.  Continue sliding scale and long-acting insulin at this time.  Latest POC glucose of 104.  Nutrition.  Currently  on cortrak tube tube nutrition.  IR has been consulted for PEG tube placement but due to ongoing fevers on hold.  Improving fever and leukocytosis so we will keep the aspirin on hold.  We will continue cortrak tube tube feeding in the meantime.  Rectal wound secondary to continuous incontinence and skin breakdown. Continue wound care.  Anemia of critical illness Latest hemoglobin of 9.2 will transfuse for hemoglobin less than 7.  Acute urinary retention.  Has failed voiding trial.  Continue Foley catheter  Pressure injury.  We will continue wound care. Pressure Injury 03/06/22 Anus Lower;Mid Deep Tissue Pressure Injury - Purple or maroon localized area of discolored intact skin or blood-filled blister due to damage of underlying soft tissue from pressure and/or shear. raised purple area at base of rect (Active)  03/06/22 1730  Location: Anus  Location Orientation: Lower;Mid  Staging: Deep Tissue Pressure Injury - Purple or maroon localized area of discolored intact skin or blood-filled blister due to damage of underlying soft tissue from pressure and/or shear.  Wound Description (Comments): raised purple area at base of rectum  Present on Admission: No     Pressure Injury 03/13/22 Heel Right Stage 1 -  Intact skin with non-blanchable redness of a localized area usually over a bony prominence. (Active)  03/13/22 0948  Location: Heel  Location Orientation: Right  Staging: Stage 1 -  Intact skin with non-blanchable redness of a localized area usually over a bony prominence.  Wound Description (Comments):   Present on Admission: No     Pressure Injury 03/13/22 Heel Left Deep Tissue Pressure Injury - Purple or maroon localized area of discolored intact skin or blood-filled blister due to damage of underlying soft tissue from pressure and/or shear. (Active)  03/13/22 0948  Location: Heel  Location Orientation: Left  Staging: Deep Tissue Pressure Injury - Purple or maroon localized area of  discolored intact skin or blood-filled blister due to damage of underlying soft tissue from pressure and/or shear.  Wound Description (Comments):   Present on Admission: No   Extreme debility, deconditioning We will assess with PT OT as able.  Will likely need rehabilitation.       DVT prophylaxis: SCD's Start: 03/01/22 2003 Place TED hose Start: 03/01/22 0851 heparin injection 5,000 Units Start: 02/26/22 1400 Place and maintain sequential compression device Start: 02/15/22 1026   Code Status:     Code Status: Full Code  Disposition: Likely to LTAC.   TOC on board.  Status is: Inpatient  Remains inpatient appropriate because: Pending clinical improvement, IV antibiotics, neurosurgery follow-up, plan for PEG tube placement   Family Communication:  I spoke with the patient's brother and daughter on the phone on 03/15/2022.  Consultants:  Infectious disease PCCM Neurosurgery  Procedures:  Intubation and mechanical ventilation Tracheostomy placement Cortrak tube placement Decompressive laminectomies on 02/24/2022 and 03/01/2022.  Antimicrobials:  Rocephin IV  Subjective  Patient was seen and examined at bedside.  Follows commands.  Denies overt pain.  Difficult to understand speech weak and deconditioned Objective: Vitals:   03/17/22 2315 03/18/22 0411 03/18/22 0500 03/18/22 0910  BP: (!) 152/78 (!) 147/77    Pulse: (!) 108 (!) 108  92  Resp: 18 20  (!) 22  Temp:  98.8 F (37.1 C)    TempSrc:  Oral  SpO2: 97% 98%  96%  Weight:   129.3 kg   Height:        Intake/Output Summary (Last 24 hours) at 03/18/2022 1054 Last data filed at 03/18/2022 8416 Gross per 24 hour  Intake 0 ml  Output 4750 ml  Net -4750 ml   Filed Weights   03/15/22 0500 03/16/22 0500 03/18/22 0500  Weight: 95.1 kg 94.9 kg 129.3 kg    Physical Examination: Body mass index is 48.92 kg/m.   General: Obese built, not in obvious distress, cortrak tube tube in place, appears weak and  deconditioned. HENT:   No scleral pallor or icterus noted. Oral mucosa is moist.  Tracheostomy tube in place. Chest:   Diminished breath sounds bilaterally.  Coarse breath sounds noted. CVS: S1 &S2 heard. No murmur.  Regular rate and rhythm. Abdomen: Soft, nontender, nondistended.  Bowel sounds are heard.   Extremities: No cyanosis, clubbing or edema.  Peripheral pulses are palpable. Psych: Alert, awake and oriented, normal mood CNS: Moves upper extremities against gravity, unable to move bilateral lower extremities. Skin: Warm and dry.  No rashes noted.    Data Reviewed:   CBC: Recent Labs  Lab 03/14/22 0358 03/15/22 0302 03/16/22 0352 03/17/22 0108 03/18/22 0259  WBC 11.9* 13.1* 12.3* 12.0* 10.1  NEUTROABS 7.7  --   --   --   --   HGB 9.5* 9.9* 9.5* 8.8* 9.2*  HCT 31.5* 32.7* 32.3* 30.2* 30.9*  MCV 95.7 97.9 99.7 100.0 97.8  PLT 236 249 233 236 606    Basic Metabolic Panel: Recent Labs  Lab 03/14/22 0358 03/15/22 0302 03/16/22 0352 03/16/22 0914 03/17/22 0108 03/18/22 0259  NA 154* 157* 162* 162* 155* 151*  K 4.0 4.0 4.1  --  3.1* 4.5  CL 119* 124* 127*  --  122* 119*  CO2 '25 24 27  '$ --  25 25  GLUCOSE 122* 130* 107*  --  224* 137*  BUN 26* 38* 40*  --  44* 33*  CREATININE 0.87 1.05* 1.05*  --  0.94 0.83  CALCIUM 8.9 8.8* 8.6*  --  8.2* 8.7*  MG  --  2.4 2.9*  --  2.5* 2.5*    Liver Function Tests: Recent Labs  Lab 03/15/22 0302 03/16/22 0352 03/17/22 0108  AST '23 21 23  '$ ALT '15 16 17  '$ ALKPHOS 117 105 95  BILITOT 0.6 0.4 0.3  PROT 7.6 7.3 6.7  ALBUMIN 2.4* 2.4* 2.3*     Radiology Studies: MR CERVICAL SPINE W WO CONTRAST  Result Date: 03/16/2022 CLINICAL DATA:  56 year old female. 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 Meningitis. Quadriplegia. Status post 02/24/2022 C3 through T1 posterior decompression of epidural abscess, C3 through C7 posterior fusion. And status post 03/01/2022 posterior epidural abscess evacuation  from T12 utilizing pediatric feeding tube assisted evacuation of frank pus and necrotic fatty tissue from the thoracolumbar epidural space. Persistent fever and leukocytosis now. EXAM: MRI CERVICAL SPINE WITHOUT AND WITH CONTRAST TECHNIQUE: Multiplanar and multiecho pulse sequences of the cervical spine, to include the craniocervical junction and cervicothoracic junction, were obtained without and with intravenous contrast. CONTRAST:  9.50m GADAVIST GADOBUTROL 1 MMOL/ML IV SOLN COMPARISON:  Postoperative cervical spine MRI 03/07/2022. FINDINGS: Alignment: Stable straightening of cervical lordosis. No significant spondylolisthesis. Vertebrae: Posterior decompression from C3 through the superior T1 lamina. Bilateral posterior spinal fusion hardware with mild susceptibility artifact. No convincing marrow edema. Background bone marrow signal is within normal limits. Cord: Cervical spinal cord signal and morphology  now appears normal. No epidural or intrathecal collection identified. Mild posterior dural thickening and enhancement suspected, no abnormal intradural enhancement. Posterior Fossa, vertebral arteries, paraspinal tissues: Cervicomedullary junction is within normal limits. Grossly stable visible brain parenchyma, conspicuous dilated perivascular spaces in the pons. No abnormal posterior fossa enhance min identified. Posterior decompression and fusion surgical changes with fairly simple appearing fluid collection in the laminectomy space, no associated mass effect on the thecal sac. No unexpected paraspinal soft tissue inflammation. Partially visible tracheostomy tube at the thoracic inlet. Partially visible enteric tube coursing into the esophagus. Disc levels: C2-C3 and C3-C4 levels appear stable, negative. C4-C5 through C6-C7. Subtle increased T2 and STIR signal within the disc spaces, which appear chronically degenerated otherwise with disc space loss and circumferential disc osteophyte complex. Of these,  there is postcontrast enhancement of the C5-C6 disc space (series 28, image 9), which is stable or regressed since 03/07/2022. No convincing new endplate erosion there. Capacious cervical spinal canal following decompression. C7-T1: Stable and negative. IMPRESSION: 1. Abnormal enhancement of the C5-C6 disc space strongly suggestive of Discitis, and additional trace fluid signal in the adjacent C4-C5 and C6-C7 discs. But no convincing progression since 03/07/2022 and no convincing endplate Osteomyelitis. 2. Satisfactory postoperative appearance of posterior decompression and fusion from C3 through C7. Midline postoperative seroma. Electronically Signed   By: Genevie Ann M.D.   On: 03/16/2022 11:44   MR THORACIC SPINE W WO CONTRAST  Addendum Date: 03/16/2022   ADDENDUM REPORT: 03/16/2022 12:50 ADDENDUM: After reviewing the lumbar images today, it is apparent that a ventral thoracic epidural or subdural collection extends from approximately T10-T11 caudally to the L1 level. This measures up to 6 mm in thickness and dorsally displaces the lower thoracic cord. See series 15, image 39 and series 12 image 6. But there is very little associated meningeal thickening and enhancement. And the fluid within the collection is near CSF intensity. CONCLUSION: CONCLUSION Additional Ventral Thoracic Epidural Or Subdural Fluid Collection tracking from T10 through L1 measures up to 6 mm in thickness and dorsally displaces the lower thoracic spinal cord. This is most apparent on series 12, image 6, series 15, image 39, and on the sagittal Lumbar MRI today which is reported separately. This and the other Cervical, Thoracic, and Lumbar MRI findings today were discussed by telephone with Dr. Kristeen Miss on 03/16/2022 at 12:46. Electronically Signed   By: Genevie Ann M.D.   On: 03/16/2022 12:50   Result Date: 03/16/2022 CLINICAL DATA:  56 year old female. 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 Meningitis.  Quadriplegia. Status post 02/24/2022 C3 through T1 posterior decompression of epidural abscess, C3 through C7 posterior fusion. And status post 03/01/2022 posterior epidural abscess evacuation from T12 utilizing pediatric feeding tube assisted evacuation of frank pus and necrotic fatty tissue from the thoracolumbar epidural space. Persistent fever and leukocytosis now. EXAM: MRI THORACIC WITHOUT AND WITH CONTRAST TECHNIQUE: Multiplanar and multiecho pulse sequences of the thoracic spine were obtained without and with intravenous contrast. CONTRAST:  9.52m GADAVIST GADOBUTROL 1 MMOL/ML IV SOLN in conjunction with contrast enhanced imaging of the cervical spine reported separately. COMPARISON:  Cervical spine MRI today reported separately. Postoperative thoracic MRI 03/06/2022. FINDINGS: Limited cervical spine imaging:  Reported separately today. Thoracic spine segmentation: Appears to be normal, same numbering system used previously. Alignment: Stable thoracic kyphosis. No significant spondylolisthesis. Vertebrae: Lower cervical posterior element fusion hardware. Fairly subtle postoperative changes to the posterior elements at T11 and T12. No marrow edema or evidence of  acute osseous abnormality. Background bone marrow signal is within normal limits. Cord: Small foci of abnormal signal in the lateral thoracic upper thoracic cord at T2 and T3 (series 15, images 7 and 8). Relatively maintained thoracic cord volume. No associated abnormal cord enhancement. Distortion of the normal thoracic cord morphology in the mid to lower thoracic spine, most notable at T8-T9 (series 15, image 26) appears stable and may be due to arachnoid adhesions. No significant leptomeningeal enhancement at this level. But there is widespread dorsal thoracic mild dural thickening and enhancement. And beginning at the T11-T12 level there is either ventral leptomeningeal enhancement of the cord and/or ventral pachymeningeal enhancement which  continues into the upper lumbar levels (series 26, images 6 and 7). No definite conus signal abnormality. However, there is a small T10-T11 thoracic cord syrinx (series 15, image 34). Substantially improved thoracic dorsal epidural space from T10 inferiorly, but there do appear to be dorsal arachnoid adhesions and/or dorsal epidural granulation tissue remaining at T11-T12 (series 26, image 8). Trace postoperative seroma in the laminectomy space there. Lumbar levels are detailed separately today. Paraspinal and other soft tissues: Mild postoperative changes to the posterior thoracic paraspinal soft tissues. No paraspinal fluid collection. Grossly negative visible chest and upper abdominal viscera. Disc levels: No thoracic spinal stenosis now. Degenerative appearing right paracentral disc protrusion at T7-T8 is more apparent, but without significant stenosis. Similar left paracentral disc protrusion at T9-T10 appears stable. IMPRESSION: 1. Further improved appearance of the Thoracic Spine since 03/06/2022, but note: - abnormal upper thoracic lateral cord signal abnormality at T2 and T3, perhaps sequelae of small Cord Infarcts. 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 - continued abnormal dorsal thoracic dural thickening and enhancement, and evidence of additional pachymeningeal or leptomeningeal abnormality at the cauda equina and upper Lumbar Spine (detailed separately). 2. No thoracic discitis osteomyelitis. No thoracic spinal stenosis. Small degenerative disc herniations at T7-T8 and T9-8 T9. Electronically Signed: By: Genevie Ann M.D. On: 03/16/2022 12:04   MR Lumbar Spine W Wo Contrast  Result Date: 03/16/2022 CLINICAL DATA:  56 year old female. 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 Quadriplegia. Status post 02/24/2022 C3  through T1 posterior decompression of epidural abscess, C3 through C7 posterior fusion. And status post 03/01/2022 posterior epidural abscess evacuation from T12 utilizing pediatric feeding tube assisted evacuation of frank pus and necrotic fatty tissue from the thoracolumbar epidural space. Persistent fever and leukocytosis now. EXAM: MRI LUMBAR SPINE WITHOUT AND WITH CONTRAST TECHNIQUE: Multiplanar and multiecho pulse sequences of the lumbar spine were obtained without and with intravenous contrast. CONTRAST:  9.27m GADAVIST GADOBUTROL 1 MMOL/ML IV SOLN in conjunction with contrast enhanced imaging of the cervical and thoracic reported separately. COMPARISON:  Thoracic MRI today. Prior lumbar MRI 02/23/2022 and earlier. FINDINGS: Segmentation: Transitional anatomy, lumbarized S1 level which is the same numbering system used on 02/23/2022. Correlation with radiographs is recommended prior to any operative intervention. Alignment: Stable lumbar lordosis. No significant spondylolisthesis. Vertebrae: Dense marrow edema and enhancement now in the L5 spinous process (series 24, image 8) with moderate to extensive heterogeneous marrow edema also in the bilateral L5 facets, pedicles, S1 superior articulating facets, and involving the posterior L5 vertebral body left greater than right. See series 24 images 5 and 11. Bilateral L5 transverse process is also affected. But no L5 endplate erosion. No other S1 or visible  sacral marrow edema or enhancement. No L1 through L4 marrow edema or enhancement. Conus medullaris and cauda equina: Conus extends to the L1-L2 level. There is extensive abnormal intrathecal enhancement throughout the lumbar spine, beginning from the dorsal thoracic dural thickening and enhancement, becoming circumferential in the upper lumbar spine (series 25, image 7). An although there is abnormal thickening of the cauda equina nerve roots, there is only subtle associated leptomeningeal enhancement in the  lumbar spine. However, there are several small areas of loculated fluid in the cauda equina, most notably at the ventral L2-L3 level (along a segment of 19 mm series 19, image 8 and series 22, image 8). See also the left lateral recess of L3 on series 22, image 14, central L4 thecal sac on image 20. Ongoing inflammation throughout the lumbar epidural space, but no other discrete intraspinal fluid collection. However, it is apparent on these images if there is an abnormal ventral thoracic fluid collection tracking inferiorly to the T12-L1 level. See series 20, image 8. Paraspinal and other soft tissues: Extensive lumbar erector spinae muscle edema, most pronounced at the L5-S1 posterior elements. No discrete paraspinal abscess. Grossly negative visible abdominal viscera. Partially visible lobulated fibroid uterus. Disc levels: Lumbar disc spaces do not appear infected. And there is relatively mild disc disease. There is a broad-based right foraminal disc herniation at L3-L4 (series 22, image 17 and series 19, image 6) with moderate right L3 foraminal stenosis. There is a small S1-S2 posterior disc protrusion and annular fissure. IMPRESSION: 1. 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 This is likely the sequelae of bilateral septic facets at that level. Associated lumbar erector spinae muscle edema, but no discrete paraspinal abscess. 2. The lumbar thecal sac remains highly abnormal, with diffuse pachymeningeal thickening and enhancement, diffuse thickening of the cauda equina nerve roots, and several small loculated fluid collections amongst the cauda equina, suspicious for residual small intrathecal abscesses. 3. No convincing lumbar discitis at this time. Electronically Signed   By: Genevie Ann M.D.   On: 03/16/2022 12:21      LOS: 22 days    Flora Lipps, MD [facility] Available via Epic secure chat 7am-7pm After these hours, please refer to  coverage provider listed on [email or web address] 03/18/2022, 10:54 AM

## 2022-03-18 NOTE — Progress Notes (Signed)
NEUROSURGERY PROGRESS NOTE  Status post epidural abscess.  No change in neurologic function.  No acute events overnight.  Temp:  [98.5 F (36.9 C)-99.5 F (37.5 C)] 98.8 F (37.1 C) (06/04 0411) Pulse Rate:  [100-113] 108 (06/04 0411) Resp:  [16-25] 20 (06/04 0411) BP: (122-152)/(69-78) 147/77 (06/04 0411) SpO2:  [97 %-99 %] 98 % (06/04 0411) FiO2 (%):  [28 %] 28 % (06/04 0411) Weight:  [129.3 kg] 129.3 kg (06/04 0500)  Eleonore Chiquito, NP 03/18/2022 7:55 AM

## 2022-03-19 DIAGNOSIS — R7881 Bacteremia: Secondary | ICD-10-CM | POA: Diagnosis not present

## 2022-03-19 DIAGNOSIS — G009 Bacterial meningitis, unspecified: Secondary | ICD-10-CM | POA: Diagnosis not present

## 2022-03-19 DIAGNOSIS — G061 Intraspinal abscess and granuloma: Secondary | ICD-10-CM | POA: Diagnosis not present

## 2022-03-19 DIAGNOSIS — M545 Low back pain, unspecified: Secondary | ICD-10-CM | POA: Diagnosis not present

## 2022-03-19 DIAGNOSIS — J9601 Acute respiratory failure with hypoxia: Secondary | ICD-10-CM | POA: Diagnosis not present

## 2022-03-19 DIAGNOSIS — B9620 Unspecified Escherichia coli [E. coli] as the cause of diseases classified elsewhere: Secondary | ICD-10-CM | POA: Diagnosis not present

## 2022-03-19 LAB — CBC
HCT: 35 % — ABNORMAL LOW (ref 36.0–46.0)
Hemoglobin: 10.6 g/dL — ABNORMAL LOW (ref 12.0–15.0)
MCH: 29.8 pg (ref 26.0–34.0)
MCHC: 30.3 g/dL (ref 30.0–36.0)
MCV: 98.3 fL (ref 80.0–100.0)
Platelets: 235 10*3/uL (ref 150–400)
RBC: 3.56 MIL/uL — ABNORMAL LOW (ref 3.87–5.11)
RDW: 18.7 % — ABNORMAL HIGH (ref 11.5–15.5)
WBC: 9.9 10*3/uL (ref 4.0–10.5)
nRBC: 0.3 % — ABNORMAL HIGH (ref 0.0–0.2)

## 2022-03-19 LAB — COMPREHENSIVE METABOLIC PANEL
ALT: 18 U/L (ref 0–44)
AST: 26 U/L (ref 15–41)
Albumin: 2.7 g/dL — ABNORMAL LOW (ref 3.5–5.0)
Alkaline Phosphatase: 98 U/L (ref 38–126)
Anion gap: 11 (ref 5–15)
BUN: 32 mg/dL — ABNORMAL HIGH (ref 6–20)
CO2: 25 mmol/L (ref 22–32)
Calcium: 9.3 mg/dL (ref 8.9–10.3)
Chloride: 122 mmol/L — ABNORMAL HIGH (ref 98–111)
Creatinine, Ser: 0.9 mg/dL (ref 0.44–1.00)
GFR, Estimated: >60 mL/min (ref >60)
Glucose, Bld: 158 mg/dL — ABNORMAL HIGH (ref 70–99)
Potassium: 3.9 mmol/L (ref 3.5–5.1)
Sodium: 158 mmol/L — ABNORMAL HIGH (ref 135–145)
Total Bilirubin: 0.5 mg/dL (ref 0.3–1.2)
Total Protein: 7.7 g/dL (ref 6.5–8.1)

## 2022-03-19 LAB — GLUCOSE, CAPILLARY
Glucose-Capillary: 125 mg/dL — ABNORMAL HIGH (ref 70–99)
Glucose-Capillary: 132 mg/dL — ABNORMAL HIGH (ref 70–99)
Glucose-Capillary: 139 mg/dL — ABNORMAL HIGH (ref 70–99)
Glucose-Capillary: 168 mg/dL — ABNORMAL HIGH (ref 70–99)
Glucose-Capillary: 239 mg/dL — ABNORMAL HIGH (ref 70–99)
Glucose-Capillary: 248 mg/dL — ABNORMAL HIGH (ref 70–99)

## 2022-03-19 LAB — CULTURE, BLOOD (ROUTINE X 2)
Culture: NO GROWTH
Culture: NO GROWTH

## 2022-03-19 LAB — MAGNESIUM: Magnesium: 2.6 mg/dL — ABNORMAL HIGH (ref 1.7–2.4)

## 2022-03-19 MED ORDER — FREE WATER
200.0000 mL | Status: DC
  Administered 2022-03-19 – 2022-03-21 (×25): 200 mL

## 2022-03-19 MED ORDER — MIDODRINE HCL 5 MG PO TABS
10.0000 mg | ORAL_TABLET | Freq: Three times a day (TID) | ORAL | Status: DC
  Administered 2022-03-19 – 2022-03-20 (×2): 10 mg
  Filled 2022-03-19 (×2): qty 2

## 2022-03-19 NOTE — Evaluation (Signed)
Clinical/Bedside Swallow Evaluation Patient Details  Name: Gabrielle Vincent MRN: [id number] Date of Birth: 02/27/1966  Today's Date: 03/19/2022 Time: SLP Start Time (ACUTE ONLY): 1016 SLP Stop Time (ACUTE ONLY): 1037 SLP Time Calculation (min) (ACUTE ONLY): 21 min  Past Medical History:  Past Medical History:  Diagnosis Date   AKI (acute kidney injury) ([place]) 02/14/2022   Aortic atherosclerosis ([place]) 02/14/2022   Class II obesity 02/14/2022   Diverticulosis 02/14/2022   Hepatic steatosis 02/14/2022   Hyperlipidemia 02/07/2016   Seasonal allergies    Swelling of lower extremity    bilateral   Varicose veins    right leg   Past Surgical History:  Past Surgical History:  Procedure Laterality Date   LUMBAR LAMINECTOMY/DECOMPRESSION MICRODISCECTOMY N/A 03/01/2022   Procedure: Thoracic eleven - twelve Laminectomy with drainage of epidural abscess;  Surgeon: Kristeen Miss, MD;  Location: [facility];  Service: Neurosurgery;  Laterality: N/A;   lymphnode drainage surgery     POSTERIOR CERVICAL FUSION/FORAMINOTOMY N/A 02/24/2022   Procedure: POSTERIOR CERVICAL LAMINECTOMY CERVICAL THREE-CERVICAL SEVEN WITH LATERAL MASS FUSION / FIXATION;  Surgeon: Eustace Moore, MD;  Location: [facility];  Service: Neurosurgery;  Laterality: N/A;   HPI:  56 y.o. female presents 02/14/2022 with severe back pain associated with hematuria and dysuria. Found to have UTI and  AKI. MRI demonstrated small herniated nucleus pulposus at L3-L4. 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 Cultures positive for meningitis. MRI spine demonstrates diffuse epidural abscess with cord compression and likely signal change. Pt underwent C3-T1 decompressive laminectomy with epidural abscess evaucation along with C3-7 fusion on 5/13. MRI 5/16 extensive thoracic epidural abscess. 5/18- Pt underwent thoracolumbar laminectomy and decompression of epidural abscess. Extubated  5/19. Swallow evaluated 5/21 with recs for NPO; high aspiration risk. Reintubated 5/25; 5/25 repeat MRI with ongoing meningitis + new area of ischemia in central pons. Trach 5/26.  PMH includes obesity, HLD, diverticulosis.    Assessment / Plan / Recommendation  Clinical Impression  Pt is aphonic with prolonged intubation this admission, followed by reintubation and now trach. Question her ability to adduct her vocal folds for swallowing and airway protection as well, although there are no overt s/s of aspiration with ice chips. She seems to orally manipulate them appropriately, and there is some movement noted to hyolaryngeal palpation to suggest a swallow occurs, but there is not much movement noted. The integrity of her swallow is unknown, and would be best assessed with instrumental swallow study. Recommend FEES in the setting of persistent aphonia. Discussed with pt, brother (in person), and sister (over phone), and all are in agreement with proceeding today. SLP Visit Diagnosis: Dysphagia, unspecified (R13.10)    Aspiration Risk  Moderate aspiration risk;Severe aspiration risk    Diet Recommendation NPO   Medication Administration: Via alternative means    Other  Recommendations Oral Care Recommendations: Oral care QID    Recommendations for follow up therapy are one component of a multi-disciplinary discharge planning process, led by the attending physician.  Recommendations may be updated based on patient status, additional functional criteria and insurance authorization.  Follow up Recommendations Acute inpatient rehab (3hours/day)      Assistance Recommended at Discharge Frequent or constant Supervision/Assistance  Functional Status Assessment    Frequency and Duration            Prognosis Prognosis for Safe Diet Advancement: Good      Swallow Study   General HPI: 56  y.o. female presents 02/14/2022 with severe back pain associated with hematuria and dysuria. Found to have  UTI and  AKI. MRI demonstrated small herniated nucleus pulposus at L3-L4. 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 Cultures positive for meningitis. MRI spine demonstrates diffuse epidural abscess with cord compression and likely signal change. Pt underwent C3-T1 decompressive laminectomy with epidural abscess evaucation along with C3-7 fusion on 5/13. MRI 5/16 extensive thoracic epidural abscess. 5/18- Pt underwent thoracolumbar laminectomy and decompression of epidural abscess. Extubated 5/19. Swallow evaluated 5/21 with recs for NPO; high aspiration risk. Reintubated 5/25; 5/25 repeat MRI with ongoing meningitis + new area of ischemia in central pons. Trach 5/26.  PMH includes obesity, HLD, diverticulosis. Type of Study: Bedside Swallow Evaluation Previous Swallow Assessment: BSE earlier this admission recommending NPO (this was before she was trached) Diet Prior to this Study: NPO;NG Tube Temperature Spikes Noted: Yes (101.4) Respiratory Status: Trach;Trach Collar Trach Size and Type: Cuff;#6;Deflated;With PMSV in place History of Recent Intubation: Yes Length of Intubations (days): 15 days (across 2 intubations) Date extubated:  (trach 5/26) Behavior/Cognition: Alert;Cooperative Oral Care Completed by SLP: No Oral Cavity - Dentition: Adequate natural dentition Self-Feeding Abilities: Total assist Patient Positioning:  (EOB) Baseline Vocal Quality: Aphonic Volitional Cough: Weak    Oral/Motor/Sensory Function     Ice Chips Ice chips: Impaired Presentation: Spoon Pharyngeal Phase Impairments: Decreased hyoid-laryngeal movement   Thin Liquid Thin Liquid: Not tested    Nectar Thick Nectar Thick Liquid: Not tested   Honey Thick Honey Thick Liquid: Not tested   Puree Puree: Not tested   Solid     Solid: Not tested      [name], M.A. [facility] Office  [phone]  Secure chat preferred  03/19/2022,12:03 PM

## 2022-03-19 NOTE — Progress Notes (Signed)
PROGRESS NOTE    Gabrielle Vincent  EHO:[id number] DOB: 1966-01-29 DOA: 02/14/2022 PCP: Merryl Hacker, No    Brief Narrative:  Gabrielle Vincent is a 56 y.o. female with history of allergies, obesity presented to hospital with complaint of severe low back pain radiating to her  lower extremity with hematuria and dysuria.  In the ED, patient was noted to be hyponatremic and hypokalemic with elevated anion gap of 19 and creatinine at 3.6.  She had leukocytosis with WBC at 21.3.  CT renal study did not show obstructive uropathy but hepatomegaly cholecystolithiasis.  Patient was then admitted hospital for acute or severe lower back pain with radiculopathy, acute kidney injury, UTI, elevated LFTs, electrolyte imbalances.    During hospitalization, patient had worsening confusion and encephalopathy and had difficulty with airway protection.  Patient was empirically started on antibiotics for meningitis and imaging guided lumbar puncture was performed which showed a purulent CSF consistent with bacterial meningitis.  MRI of the lumbar spine was then ordered and was noted to have multiple epidural abscesses and underwent decompressive laminectomies on 02/24/2022 and 03/01/2022.  Patient also was noted to have acute herniated disc L3-L4 with lumbar radiculopathy.   Patient was initially extubated on 03/02/22 but on 03/07/2022 had to be reintubated. Hospital course was complicated by septic shock, central diabetes insipidus and recent prolonged intubation which required tracheostomy on 03/09/2022.  Patient had tolerated the trach collar for 48 hours and was transferred out of the ICU.  Patient has had a prolonged hospitalization due to ongoing fevers and concern for epidural infection.  Neurosurgery and ID has been following the patient at this time.  Another issue is with nutrition.  Currently on cortrak tube tube.  Will need PEG tube placement.  Aspirin on hold.  PEG tube placement likely on 03/23/2022.  Assessment and  plan  Principal Problem:   Abscess in epidural space of cervical spine Active Problems:   Hypokalemia   Class II obesity   Encephalopathy acute   Acute respiratory failure (HCC)   E. coli UTI   Bacterial meningitis   Diabetes insipidus ([place])   H/O excision of lamina of cervical vertebra for decompression of spinal cord   E coli bacteremia   Pressure injury of skin   Hypernatremia   Acute metabolic encephalopathy Secondary to sepsis and infection.   E. coli complicated UTI with extensive epidural spinal abscess/Bacterial meningitis  Status post C3-T1 laminectomy, evacuation of abscess, thoracolumbar laminectomy/decompression by neurosurgery. Neurosurgery and infectious disease on board..  On IV Rocephin  twice daily.  Temperature max of 101.4 F on 03/18/2022, leukocytosis has resolved at this time at 9.9.  Blood culture from 03/14/22 negative in 5 days.  Repeat MRI of the cervical lumbar and thoracic spine was performed and was reviewed by neurosurgery on 03/16/2022.  No plans for neurosurgical intervention at this time.  Shock Resolved at this time.  On midodrine 20 mg 3 times daily.  Latest WBC at 9.9< 10.1 < 12.0< 12.3 <13.1 < 11.9.  Hypokalemia.  Improved after replacement.  Latest potassium 3.9  Acute hypoxic respiratory failure s/p tracheostomy Aspiration pneumonia Incidental finding of right upper lobe 2.2 mm spiculated appearing mass concerning for malignancy Spontaneous right pneumothorax-resolved Continue trach collar.  Patient has been  followed by PCCM.    Central diabetes insipidus Hyponatremia followed by hypernatremia. Patient received DDAVP during hospitalization.  Latest sodium of  158< 151<155<162<157< 154 yesterday.  Will increase free water flushes 200 mL every 2 hours.  Continue D5 water 100  mL/h.  Closely monitor sodium levels at this time.  Acute kidney injury Improved at this time.  Latest creatinine of 0.9.   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  Will need to hold aspirin for PEG tube placement likely on 03/23/2022.  Type 2 diabetes mellitus. -Newly diagnosed.  Hemoglobin A1c of 8.9.  Continue sliding scale and long-acting insulin at this time.  Latest POC glucose of 132  Nutrition.  Currently on cortrak tube tube nutrition.  IR has been consulted for PEG tube placement.  Improving leukocytosis still with fever.  So we will keep the aspirin on hold.  We will continue cortrak tube tube feeding in the meantime.  Rectal wound secondary to continuous incontinence and skin breakdown. Continue wound care.  Anemia of critical illness Latest hemoglobin of 9.2 will transfuse for hemoglobin less than 7.  Acute urinary retention.  Has failed voiding trial.  Continue Foley catheter  Pressure injury.  We will continue wound care. Pressure Injury 03/06/22 Anus Lower;Mid Deep Tissue Pressure Injury - Purple or maroon localized area of discolored intact skin or blood-filled blister due to damage of underlying soft tissue from pressure and/or shear. raised purple area at base of rect (Active)  03/06/22 1730  Location: Anus  Location Orientation: Lower;Mid  Staging: Deep Tissue Pressure Injury - Purple or maroon localized area of discolored intact skin or blood-filled blister due to damage of underlying soft tissue from pressure and/or shear.  Wound Description (Comments): raised purple area at base of rectum  Present on Admission: No     Pressure Injury 03/13/22 Heel Right Stage 1 -  Intact skin with non-blanchable redness of a localized area usually over a bony prominence. (Active)  03/13/22 0948  Location: Heel  Location Orientation: Right  Staging: Stage 1 -  Intact skin with non-blanchable redness of a localized area usually over a bony prominence.  Wound Description (Comments):   Present on Admission: No     Pressure Injury 03/13/22 Heel Left Deep Tissue Pressure Injury -  Purple or maroon localized area of discolored intact skin or blood-filled blister due to damage of underlying soft tissue from pressure and/or shear. (Active)  03/13/22 0948  Location: Heel  Location Orientation: Left  Staging: Deep Tissue Pressure Injury - Purple or maroon localized area of discolored intact skin or blood-filled blister due to damage of underlying soft tissue from pressure and/or shear.  Wound Description (Comments):   Present on Admission: No   Extreme debility, deconditioning  Patient is a good candidate for LTAC.  TOC involved.    DVT prophylaxis: SCD's Start: 03/01/22 2003 Place TED hose Start: 03/01/22 0851 heparin injection 5,000 Units Start: 02/26/22 1400 Place and maintain sequential compression device Start: 02/15/22 1026  Code Status:     Code Status: Full Code  Disposition: Likely to LTAC.   TOC on board.  Status is: Inpatient  Remains inpatient appropriate because: Pending clinical improvement, IV antibiotics, neurosurgery follow-up, plan for PEG tube placement   Family Communication:  Communicated with the patient's sister Ms Donella Stade on the phone and updated her about the clinical condition of the patient.  Consultants:  Infectious disease PCCM Neurosurgery  Procedures:  Intubation and mechanical ventilation Tracheostomy placement Cortrak tube placement Decompressive laminectomies on 02/24/2022 and 03/01/2022.  Antimicrobials:  Rocephin IV  Subjective  Today, patient was seen and examined at bedside.  Noticed fever of 101.4 F yesterday.  Complains of mild back pain.  Difficult to phonate and understand.  Objective: Vitals:   03/19/22 0422 03/19/22 0603 03/19/22 0722 03/19/22 0849  BP: 125/78 128/77 130/79   Pulse: (!) 114 (!) 110 (!) 109 (!) 123  Resp: 20 (!) 21 (!) 21 (!) 22  Temp: 98.1 F (36.7 C) 98.9 F (37.2 C) 98.8 F (37.1 C)   TempSrc: Oral Oral Oral   SpO2: 98% 96% 100% 100%  Weight:      Height:        Intake/Output  Summary (Last 24 hours) at 03/19/2022 1056 Last data filed at 03/19/2022 0813 Gross per 24 hour  Intake 2044.62 ml  Output 6950 ml  Net -4905.38 ml   Filed Weights   03/15/22 0500 03/16/22 0500 03/18/22 0500  Weight: 95.1 kg 94.9 kg 129.3 kg    Physical Examination: Body mass index is 48.92 kg/m.   General: Obese built, not in obvious distress, cortrak tube tube in place, appears weak and deconditioned HENT:   No scleral pallor or icterus noted. Oral mucosa is moist.  Tracheostomy tube in place. Chest:   Diminished breath sounds bilaterally.  Coarse breath sounds noted. CVS: S1 &S2 heard. No murmur.  Regular rate and rhythm. Abdomen: Soft, nontender, nondistended.  Bowel sounds are heard.   Extremities: No cyanosis, clubbing or edema.  Peripheral pulses are palpable. Psych: Alert, awake and oriented, normal mood CNS: Moves upper extremities against gravity.  Unable to move bilateral lower extremities. Skin: Warm and dry.  No rashes noted.  Data Reviewed:   CBC: Recent Labs  Lab 03/14/22 0358 03/15/22 0302 03/16/22 0352 03/17/22 0108 03/18/22 0259 03/19/22 0529  WBC 11.9* 13.1* 12.3* 12.0* 10.1 9.9  NEUTROABS 7.7  --   --   --   --   --   HGB 9.5* 9.9* 9.5* 8.8* 9.2* 10.6*  HCT 31.5* 32.7* 32.3* 30.2* 30.9* 35.0*  MCV 95.7 97.9 99.7 100.0 97.8 98.3  PLT 236 249 233 236 254 553    Basic Metabolic Panel: Recent Labs  Lab 03/15/22 0302 03/16/22 0352 03/16/22 0914 03/17/22 0108 03/18/22 0259 03/19/22 0529  NA 157* 162* 162* 155* 151* 158*  K 4.0 4.1  --  3.1* 4.5 3.9  CL 124* 127*  --  122* 119* 122*  CO2 24 27  --  '25 25 25  '$ GLUCOSE 130* 107*  --  224* 137* 158*  BUN 38* 40*  --  44* 33* 32*  CREATININE 1.05* 1.05*  --  0.94 0.83 0.90  CALCIUM 8.8* 8.6*  --  8.2* 8.7* 9.3  MG 2.4 2.9*  --  2.5* 2.5* 2.6*    Liver Function Tests: Recent Labs  Lab 03/15/22 0302 03/16/22 0352 03/17/22 0108 03/19/22 0529  AST '23 21 23 26  '$ ALT '15 16 17 18  '$ ALKPHOS 117 105  95 98  BILITOT 0.6 0.4 0.3 0.5  PROT 7.6 7.3 6.7 7.7  ALBUMIN 2.4* 2.4* 2.3* 2.7*     Radiology Studies: No results found.    LOS: 33 days    Flora Lipps, MD [facility] Available via Epic secure chat 7am-7pm After these hours, please refer to coverage provider listed on [email or web address] 03/19/2022, 10:56 AM

## 2022-03-19 NOTE — Progress Notes (Signed)
[facility]  Date of Admission:  02/14/2022     Total days of antibiotics 33         ASSESSMENT:  Ms. Dilworth blood cultures from 03/14/22 have finalized without growth to date. MRI on 6/2 with cervical discitis without endplate osteomyelitis and possible osteomyelitis in the posterior elements at L5/S1 with no clear-cut collection of pus that would appear to need surgical drainage. Febrile once again on 03/18/22 with temperature of 101.3 F. Suspect this is likely related to burden of infection and will continue to monitor. No new clear sources of infection at this point. Continue with current dose of ceftriaxone. Remaining medical and supportive care per primary team.   PLAN:  Continue current dose of ceftriaxone.  Monitor fever curves. Remaining medical and supportive care per primary team.  Principal Problem:   Abscess in epidural space of cervical spine Active Problems:   Hypokalemia   Class II obesity   Encephalopathy acute   Acute respiratory failure (HCC)   E. coli UTI   Bacterial meningitis   Diabetes insipidus ([place])   H/O excision of lamina of cervical vertebra for decompression of spinal cord   E coli bacteremia   Pressure injury of skin   Hypernatremia    chlorhexidine gluconate (MEDLINE KIT)  15 mL Mouth Rinse BID   Chlorhexidine Gluconate Cloth  6 each Topical Daily   feeding supplement (JEVITY 1.5 CAL/FIBER)  237 mL Per Tube TID WC   feeding supplement (JEVITY 1.5 CAL/FIBER)  474 mL Per Tube Q24H   feeding supplement (PROSource TF)  90 mL Per Tube BID   free water  200 mL Per Tube Q2H   Gerhardt's butt cream   Topical BID   heparin injection (subcutaneous)  5,000 Units Subcutaneous Q8H   insulin aspart  0-15 Units Subcutaneous Q4H   insulin aspart  8 Units Subcutaneous Q4H   insulin glargine-yfgn  25 Units Subcutaneous BID   mouth rinse  15 mL Mouth Rinse 10 times per day   midodrine  20 mg Per Tube Q8H   nutrition supplement  (JUVEN)  1 packet Per Tube BID BM   pantoprazole sodium  40 mg Per Tube Q1400    SUBJECTIVE:  Afebrile overnight with no acute events. Fatigued after working with physical therapy.   Allergies  Allergen Reactions   Erythromycin Anaphylaxis   Penicillins Nausea Only   Prednisone Other (See Comments)    Lymph node swelling     Review of Systems: Review of Systems  Constitutional:  Negative for chills, fever and weight loss.  Respiratory:  Positive for cough. Negative for shortness of breath and wheezing.   Cardiovascular:  Negative for chest pain and leg swelling.  Gastrointestinal:  Negative for abdominal pain, constipation, diarrhea, nausea and vomiting.  Skin:  Negative for rash.     OBJECTIVE: Vitals:   03/19/22 0722 03/19/22 0849 03/19/22 1105 03/19/22 1212  BP: 130/79  124/82 113/75  Pulse: (!) 109 (!) 123 (!) 117 (!) 116  Resp: (!) 21 (!) 22 (!) 22 (!) 28  Temp: 98.8 F (37.1 C)   98.9 F (37.2 C)  TempSrc: Oral   Oral  SpO2: 100% 100% 97% 97%  Weight:      Height:       Body mass index is 48.92 kg/m.  Physical Exam Constitutional:      General: She is not in acute distress.    Appearance: She is well-developed.  Cardiovascular:  Rate and Rhythm: Normal rate and regular rhythm.     Heart sounds: Normal heart sounds.  Pulmonary:     Effort: Pulmonary effort is normal.     Breath sounds: Normal breath sounds.     Comments: Trach in place with trach collar use.  Skin:    General: Skin is warm and dry.  Neurological:     Mental Status: She is alert and oriented to person, place, and time.  Psychiatric:        Behavior: Behavior normal.        Thought Content: Thought content normal.        Judgment: Judgment normal.    Lab Results Lab Results  Component Value Date   WBC 9.9 03/19/2022   HGB 10.6 (L) 03/19/2022   HCT 35.0 (L) 03/19/2022   MCV 98.3 03/19/2022   PLT 235 03/19/2022    Lab Results  Component Value Date   CREATININE 0.90  03/19/2022   BUN 32 (H) 03/19/2022   NA 158 (H) 03/19/2022   K 3.9 03/19/2022   CL 122 (H) 03/19/2022   CO2 25 03/19/2022    Lab Results  Component Value Date   ALT 18 03/19/2022   AST 26 03/19/2022   ALKPHOS 98 03/19/2022   BILITOT 0.5 03/19/2022     Microbiology: Recent Results (from the past 240 hour(s))  Culture, Respiratory w Gram Stain     Status: None   Collection Time: 03/09/22  3:47 PM   Specimen: Bronchoalveolar Lavage; Respiratory  Result Value Ref Range Status   Specimen Description BRONCHIAL ALVEOLAR LAVAGE  Final   Special Requests NONE  Final   Gram Stain   Final    MODERATE WBC PRESENT,BOTH PMN AND MONONUCLEAR RARE BUDDING YEAST SEEN Performed at [facility], [address] [place], [place] [address]    Culture MODERATE CANDIDA ALBICANS  Final   Report Status 03/12/2022 FINAL  Final  Culture, blood (Routine X 2) w Reflex to ID Panel     Status: None   Collection Time: 03/14/22  3:59 AM   Specimen: BLOOD RIGHT HAND  Result Value Ref Range Status   Specimen Description BLOOD RIGHT HAND  Final   Special Requests   Final    BOTTLES DRAWN AEROBIC AND ANAEROBIC Blood Culture results may not be optimal due to an excessive volume of blood received in culture bottles   Culture   Final    NO GROWTH 5 DAYS Performed at [facility], [place] [address] [place], [place] [address]    Report Status 03/19/2022 FINAL  Final  Culture, blood (Routine X 2) w Reflex to ID Panel     Status: None   Collection Time: 03/14/22  4:06 AM   Specimen: BLOOD RIGHT FOREARM  Result Value Ref Range Status   Specimen Description BLOOD RIGHT FOREARM  Final   Special Requests   Final    BOTTLES DRAWN AEROBIC AND ANAEROBIC Blood Culture results may not be optimal due to an excessive volume of blood received in culture bottles   Culture   Final    NO GROWTH 5 DAYS Performed at [facility], [place] [address], [place], [place] [address]    Report Status 03/19/2022 FINAL   Final     Terri Piedra, NP [facility]  03/19/2022  1:46 PM

## 2022-03-19 NOTE — Progress Notes (Signed)
RT NOTE: RT removed trach sutures per CCM with no complications. Trach care performed, IC changed, and trach ties changed and secure. Vitals are stable. RT will continue to monitor.

## 2022-03-19 NOTE — Procedures (Addendum)
Objective Swallowing Evaluation: Type of Study: FEES-Fiberoptic Endoscopic Evaluation of Swallow   Patient Details  Name: Gabrielle Vincent MRN: [id number] Date of Birth: Jul 22, 1966  Today's Date: 03/19/2022 Time: SLP Start Time (ACUTE ONLY): 1200 -SLP Stop Time (ACUTE ONLY): 1259  SLP Time Calculation (min) (ACUTE ONLY): 59 min   Past Medical History:  Past Medical History:  Diagnosis Date   AKI (acute kidney injury) ([place]) 02/14/2022   Aortic atherosclerosis ([place]) 02/14/2022   Class II obesity 02/14/2022   Diverticulosis 02/14/2022   Hepatic steatosis 02/14/2022   Hyperlipidemia 02/07/2016   Seasonal allergies    Swelling of lower extremity    bilateral   Varicose veins    right leg   Past Surgical History:  Past Surgical History:  Procedure Laterality Date   LUMBAR LAMINECTOMY/DECOMPRESSION MICRODISCECTOMY N/A 03/01/2022   Procedure: Thoracic eleven - twelve Laminectomy with drainage of epidural abscess;  Surgeon: Kristeen Miss, MD;  Location: [name];  Service: Neurosurgery;  Laterality: N/A;   lymphnode drainage surgery     POSTERIOR CERVICAL FUSION/FORAMINOTOMY N/A 02/24/2022   Procedure: POSTERIOR CERVICAL LAMINECTOMY CERVICAL THREE-CERVICAL SEVEN WITH LATERAL MASS FUSION / FIXATION;  Surgeon: Eustace Moore, MD;  Location: [place];  Service: Neurosurgery;  Laterality: N/A;   HPI: 56 y.o. female presents 02/14/2022 with severe back pain associated with hematuria and dysuria. Found to have UTI and  AKI. MRI demonstrated small herniated nucleus pulposus at L3-L4. 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 Cultures positive for meningitis. MRI spine demonstrates diffuse epidural abscess with cord compression and likely signal change. Pt underwent C3-T1 decompressive laminectomy with epidural abscess evaucation along with C3-7 fusion on 5/13. MRI 5/16 extensive thoracic epidural abscess. 5/18- Pt underwent  thoracolumbar laminectomy and decompression of epidural abscess. Extubated 5/19. Swallow evaluated 5/21 with recs for NPO; high aspiration risk. Reintubated 5/25; 5/25 repeat MRI with ongoing meningitis + new area of ischemia in central pons. Trach 5/26.  PMH includes obesity, HLD, diverticulosis.   Subjective: alert and following commands, trying to talk but aphonic    Recommendations for follow up therapy are one component of a multi-disciplinary discharge planning process, led by the attending physician.  Recommendations may be updated based on patient status, additional functional criteria and insurance authorization.  Assessment / Plan / Recommendation     03/19/2022    1:00 PM  Clinical Impressions  Clinical Impression Pt has bilateral movement of her vocal folds, but what appears to be incomplete adduction. She has generalized pharyngeal weakness that also contributes to incomplete pharyngeal clearance and airway protection. Thin liquids via tsp and ice chips penetrate through the interarytenoid space, possibly spilling below the vocal folds. Intermittent coughing throughout the study was not overtly following aspiration events, although the potential for delayed sensation cannot be fully excluded. Pt had some penetration but no obvious aspiration with purees and nectar thick liquids, although also required multiple subswallows per bolus to reduce pharyngeal residue and pt seemed fatigued by the end of the study. PMV did remain in place for the duration of the FEES though. Recommend increasing trials of PMV, using it intermittently when full supervision can be provided. Longer use may also be facilitated by at least exchanging for a cuffless trach. Would also try trials of purees and nectar thick liquids when alert and PMV is donned, to work on utilizing musculature and providing increased moisture. If she tolerates this well, could consider starting a diet of  these consistencies.   SLP Visit  Diagnosis Dysphagia, unspecified (R13.10)  Impact on safety and function Mild aspiration risk;Moderate aspiration risk         03/19/2022    1:00 PM  Treatment Recommendations  Treatment Recommendations Therapy as outlined in treatment plan below        03/19/2022    1:00 PM  Prognosis  Prognosis for Safe Diet Advancement Good  Barriers to Reach Goals Cognitive deficits;Severity of deficits       03/19/2022    1:00 PM  Diet Recommendations  SLP Diet Recommendations Other (Comment)  Liquid Administration via Spoon;Straw  Medication Administration Via alternative means  Compensations Slow rate;Small sips/bites  Postural Changes Seated upright at 90 degrees         03/19/2022    1:00 PM  Other Recommendations  Oral Care Recommendations Oral care QID  Other Recommendations Place PMSV during PO intake  Follow Up Recommendations Acute inpatient rehab (3hours/day)  Assistance recommended at discharge Frequent or constant Supervision/Assistance  Functional Status Assessment Patient has had a recent decline in their functional status and demonstrates the ability to make significant improvements in function in a reasonable and predictable amount of time.       03/19/2022    1:00 PM  Frequency and Duration   Speech Therapy Frequency (ACUTE ONLY) min 2x/week  Treatment Duration 2 weeks          View : No data to display.             03/19/2022    1:00 PM  Pharyngeal Phase  Pharyngeal Phase Impaired  Pharyngeal- Nectar Teaspoon Reduced pharyngeal peristalsis;Reduced epiglottic inversion;Reduced anterior laryngeal mobility;Reduced laryngeal elevation;Reduced airway/laryngeal closure;Reduced tongue base retraction;Pharyngeal residue - valleculae;Pharyngeal residue - pyriform;Inter-arytenoid space residue  Pharyngeal- Nectar Straw Reduced pharyngeal peristalsis;Reduced epiglottic inversion;Reduced anterior laryngeal mobility;Reduced laryngeal elevation;Reduced airway/laryngeal  closure;Reduced tongue base retraction;Pharyngeal residue - valleculae;Pharyngeal residue - pyriform  Pharyngeal- Thin Teaspoon Reduced pharyngeal peristalsis;Reduced epiglottic inversion;Reduced anterior laryngeal mobility;Reduced laryngeal elevation;Reduced airway/laryngeal closure;Reduced tongue base retraction;Pharyngeal residue - valleculae;Pharyngeal residue - pyriform;Inter-arytenoid space residue;Penetration/Aspiration during swallow;Penetration/Apiration after swallow  Pharyngeal Material enters airway, passes BELOW cords without attempt by patient to eject out (silent aspiration)  Pharyngeal- Puree Reduced pharyngeal peristalsis;Reduced epiglottic inversion;Reduced anterior laryngeal mobility;Reduced laryngeal elevation;Reduced airway/laryngeal closure;Reduced tongue base retraction;Pharyngeal residue - valleculae;Pharyngeal residue - pyriform;Penetration/Aspiration during swallow  Pharyngeal Material enters airway, remains ABOVE vocal cords then ejected out         View : No data to display.           [name], M.A. [place] Office [phone]  Secure chat preferred  03/19/2022, 3:24 PM

## 2022-03-19 NOTE — Progress Notes (Signed)
Speech Language Pathology Treatment: [name] Speaking valve  Patient Details Name: Gabrielle Vincent MRN: [id number] DOB: 10/11/1966 Today's Date: 03/19/2022 Time: 5643-3295 SLP Time Calculation (min) (ACUTE ONLY): 21 min  Assessment / Plan / Recommendation Clinical Impression  Pt was seen while EOB with PT/OT, much more alert today. PMV was placed with an episode of coughing that quickly subsided. No back pressure or overt change in VS noted while PMV was donned. Pt is still not able to phonate when cued to do so, but is responsive to questions and mouthing responses. She was getting fatigued from therapy session that had started before SLP arrived, so valve was doffed as pt was transitioned back to bed to rest. In total worn for several minutes, which is longer then she has been able to tolerate it in the past. Will try to place for a longer duration during next visit when not as fatigued. Education was provided to brother (in person) and sister (over phone) about use of PMV with SLP until it can be used for longer intervals, but also noting that her aphonia persists even with PMV in place.    HPI HPI: 56 y.o. female presents 02/14/2022 with severe back pain associated with hematuria and dysuria. Found to have UTI and  AKI. MRI demonstrated small herniated nucleus pulposus at L3-L4. 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 Cultures positive for meningitis. MRI spine demonstrates diffuse epidural abscess with cord compression and likely signal change. Pt underwent C3-T1 decompressive laminectomy with epidural abscess evaucation along with C3-7 fusion on 5/13. MRI 5/16 extensive thoracic epidural abscess. 5/18- Pt underwent thoracolumbar laminectomy and decompression of epidural abscess. Extubated 5/19. Swallow evaluated 5/21 with recs for NPO; high aspiration risk. Reintubated 5/25; 5/25 repeat MRI with ongoing  meningitis + new area of ischemia in central pons. Trach 5/26.  PMH includes obesity, HLD, diverticulosis.      SLP Plan  Other (Comment) (FEES)      Recommendations for follow up therapy are one component of a multi-disciplinary discharge planning process, led by the attending physician.  Recommendations may be updated based on patient status, additional functional criteria and insurance authorization.    Recommendations         Patient may use Passy-Muir Speech Valve: with SLP only MD: Please consider changing trach tube to : Cuffless;Smaller size         Oral Care Recommendations: Oral care QID Follow Up Recommendations: Acute inpatient rehab (3hours/day) Assistance recommended at discharge: Frequent or constant Supervision/Assistance SLP Visit Diagnosis: Aphonia (R49.1) Plan: Other (Comment) (FEES)           [name], M.A. [place] Office [phone]  Secure chat preferred   03/19/2022, 11:53 AM

## 2022-03-19 NOTE — Progress Notes (Signed)
Occupational Therapy Treatment Patient Details Name: Gabrielle Vincent MRN: [id number] DOB: 1966/03/12 Today's Date: 03/19/2022   History of present illness 56 y.o. female presents to [facility] hospital on 02/14/2022 with complaints of severe back pain associated with hematuria and dysuria. Pt admitted for management of UTI and AKI. MRI demonstrates small herniated nucleus pulposus at L3-L4 on the left side. 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 Pt intubated 02/16/2022. Cultures positive for meningitis. MRI 02/23/2022 demonstrates diffuse epidural abscess with cord compression and likely signal change. Pt underwent C3-T1 decompressive laminectomy with epidural abscess evaucation along with C3-7 fusion on 5/13. MRI 5/16 demonstrates Extensive thoracic epidural abscess. 5/18- Pt underwent Thoracolumbar laminectomy and decompression of epidural abscess. Extubated 5/19. 5/24 reintubated, 5/25 chest tube placement and MRI with new area of ischemia in central pons. Trachostomy on 5/26. PMH includes obesity, HLD, diverticulosis.   OT comments  Pt progressing towards established OT goals and demonstrating high motivation to participate in therapy despite pain. Pt requiring Total A for bed mobility and able to achieve sitting balance at EOB with Mod A. Pt participating in washing her face while sitting at EOB; requiring Max A for bringing RUE to face. Pt participating in UE exercises at EOB. Continue to highly recommend dc to AIR for intensive OT. Will continue to follow acutely as admitted.   Recommendations for follow up therapy are one component of a multi-disciplinary discharge planning process, led by the attending physician.  Recommendations may be updated based on patient status, additional functional criteria and insurance authorization.    Follow Up Recommendations  Acute inpatient rehab (3hours/day)    Assistance Recommended at Discharge    Patient can return home with the  following  Two people to help with walking and/or transfers;Two people to help with bathing/dressing/bathroom;Assistance with cooking/housework;Assistance with feeding;Direct supervision/assist for medications management;Direct supervision/assist for financial management;Assist for transportation;Help with stairs or ramp for entrance   Equipment Recommendations  Other (comment) (Pending progress)    Recommendations for Other Services Rehab consult    Precautions / Restrictions Precautions Precautions: Fall;Back Precaution Booklet Issued: No Precaution Comments: Reviewed precautions Required Braces or Orthoses: Other Brace Other Brace: no brace needed per orders Restrictions Weight Bearing Restrictions: No       Mobility Bed Mobility Overal bed mobility: Needs Assistance Bed Mobility: Supine to Sit, Sit to Supine     Supine to sit: Total assist, +2 for physical assistance Sit to supine: Total assist, +2 for physical assistance   General bed mobility comments: HOB elevated to chair position then helicopter method using bed pad to pivot to EOB. HOB again elevated for return to supine.    Transfers Overall transfer level: Needs assistance                 General transfer comment: Defered due to safety     Balance Overall balance assessment: Needs assistance Sitting-balance support: No upper extremity supported (long sitting) Sitting balance-Leahy Scale: Poor Sitting balance - Comments: Mod A for dyanmic sitting balance. Pt sat EOB x 15 minutes. Postural control: Left lateral lean                                 ADL either performed or assessed with clinical judgement   ADL Overall ADL's : Needs assistance/impaired     Grooming: Maximal assistance;Sitting Grooming Details (indicate cue type and reason): Pt requiring Mod A for  sitting balance during dynamic tasks. Pt requiring Max A to support elbow and wrist to bring hand to wash face                                General ADL Comments: Pt tolerating sitting at EOB for 15 min with Mod A for balance. Performing exercises and grooming task.    Extremity/Trunk Assessment Upper Extremity Assessment Upper Extremity Assessment: RUE deficits/detail;LUE deficits/detail RUE Deficits / Details: PROM WFL; grimacing with certain movements. Pt able to maintain grasp on soap bottle (empty), but poor attention to open and close hands. RUE Sensation: WNL RUE Coordination: decreased fine motor;decreased gross motor LUE Deficits / Details: PROM WFL; grimacing with certain movements. Pt able to maintain grasp on soap bottle (empty), but poor attention to open and close hands. LUE Sensation: WNL LUE Coordination: decreased fine motor;decreased gross motor   Lower Extremity Assessment Lower Extremity Assessment: Defer to PT evaluation RLE Deficits / Details: flaccid, withdrawal to pain, PROM WFL LLE Deficits / Details: flaccid, withdrawal to pain, PROM appears [facility] however pain limiting ankle DF and knee/hip flexion        Vision   Vision Assessment?: Vision impaired- to be further tested in functional context   Perception Perception Perception: Not tested   Praxis Praxis Praxis: Not tested    Cognition Arousal/Alertness: Awake/alert Behavior During Therapy: WFL for tasks assessed/performed Overall Cognitive Status: Difficult to assess                                 General Comments: Pt more awake and alert this session. Very motivated to participate in therapy despite discomfort and pain. Pt following one step commands.        Exercises Exercises: Other exercises Other Exercises Other Exercises: "Patty cake" ; clapping hands together adn then touch therpaists hand with R. repeating and altenating. x2. sitting at EOB Other Exercises: Sitting at EOB for ~15 min.    Shoulder Instructions       General Comments HR in 120s. BP stable. SpO2 in 90s on trach  collar.    Pertinent Vitals/ Pain       Pain Assessment Pain Assessment: Faces Faces Pain Scale: Hurts little more Pain Location: Grimacing with moveement of BUE/BLE Pain Descriptors / Indicators: Grimacing, Tingling (stiff) Pain Intervention(s): Monitored during session, Limited activity within patient's tolerance, Repositioned  Home Living                                          Prior Functioning/Environment              Frequency  Min 2X/week        Progress Toward Goals  OT Goals(current goals can now be found in the care plan section)  Progress towards OT goals: Progressing toward goals  Acute Rehab OT Goals OT Goal Formulation: With patient Time For Goal Achievement: 04/02/22 Potential to Achieve Goals: Fair ADL Goals Pt Will Perform Grooming: sitting;with mod assist Pt Will Perform Upper Body Dressing: with mod assist;sitting Pt Will Perform Lower Body Dressing: with max assist;sit to/from stand Pt Will Transfer to Toilet: with mod assist;stand pivot transfer;bedside commode Additional ADL Goal #1: Pt will complete bed mobility with mod A as a precursor to ADLs Additional ADL  Goal #2: Pt will tolerate at least 5 minutes of EOB funcitonal activity while sitting unsupported  Plan Discharge plan remains appropriate    Co-evaluation    PT/OT/SLP Co-Evaluation/Treatment: Yes Reason for Co-Treatment: Complexity of the patient's impairments (multi-system involvement);Necessary to address cognition/behavior during functional activity;For patient/therapist safety PT goals addressed during session: Mobility/safety with mobility;Balance OT goals addressed during session: ADL's and self-care SLP goals addressed during session: Swallowing;Communication    AM-PAC OT "6 Clicks" Daily Activity     Outcome Measure   Help from another person eating meals?: Total Help from another person taking care of personal grooming?: A Lot Help from another  person toileting, which includes using toliet, bedpan, or urinal?: Total Help from another person bathing (including washing, rinsing, drying)?: Total Help from another person to put on and taking off regular upper body clothing?: Total Help from another person to put on and taking off regular lower body clothing?: Total 6 Click Score: 7    End of Session Equipment Utilized During Treatment: Oxygen  OT Visit Diagnosis: Other abnormalities of gait and mobility (R26.89);Muscle weakness (generalized) (M62.81);Pain Pain - part of body:  (Generalized)   Activity Tolerance Patient tolerated treatment well   Patient Left in bed;with call bell/phone within reach;with family/visitor present   Nurse Communication Mobility status        Time: 2694-8546 OT Time Calculation (min): 31 min  Charges: OT General Charges $OT Visit: 1 Visit OT Treatments $Self Care/Home Management : 8-22 mins  [place], OTR/L Acute Rehab Pager: [phone] Office: [place] 03/19/2022, 1:28 PM

## 2022-03-19 NOTE — Progress Notes (Signed)
NAME:  Gabrielle Vincent, MRN:  [id number], DOB:  07-21-1966, LOS: 7 ADMISSION DATE:  02/14/2022, CONSULTATION DATE:  02/16/2022 REFERRING MD:  Tana Coast - [facility] CHIEF COMPLAINT:  Acute encephalopathy and sepsis    History of Present Illness:  56 year old woman initially admitted 5/3 for severe back pain radiating down her leg with associated hematuria/dysuria.  Prolonged hospital course; eventually found to have new AKI with E. Coli urosepsis and bacterial meningitis secondary to multiple epidural abscesses (s/p decompressive laminectomies). Patient was also noted to have acute herniated disc (L3-L4) with L lumbar radiculopathy. Hospital course c/b septic shock, central DI and prolonged intubation (now s/p tracheostomy 5/26).  Pertinent Medical History:  Class II obesity, hepatic steatosis, seasonal allergies, varicose veins diverticulosis, HL   Significant Hospital Events: Including procedures, antibiotic start and stop dates in addition to other pertinent events   5/3 admitted with back pain and urinary tract infection , Further complicated by AKI hyponatremia and multiple metabolic derangements.   CT imaging for renal stones showed no acute abdominal pelvic findings there was no obstructive uropathy there was severe fatty liver disease there was cholelithiasis but no cholecystitis there is colonic diverticulosis but no diverticulitis, mild circumferential thickening of the esophagus globular enlargement of the uterus with radiology recommending nonemergent pelvic ultrasound found to have uterine fibroid abdominal Ultrasound showed fatty liver but was epididymides negative.   MRI of the lumbar spine showed small left subarticular disc extrusion with inferior migration at L3-L4 correlating with radiculopathy.  She was started on ceftriaxone cultures were sent 5/4 increased confusion CT brain obtained raising concern for possible acute infarct in the right basal ganglia 5/5 progressive encephalopathy ,  Worsening leukocytosis, hypernatremia, hyperchloremia, slowly improving renal function, slowly improving procalcitonin, moved to ICU due to delirium, intubated for airway protection and to facilitate MRI imaging right IJ central line placed due to limited IV access.  Seen by neurology, also seen by infectious disease with ceftriaxone changed to cefazolin 5/6 antibiotics changed to meningitis coverage given MRI findings of fluid in the ventricles.  Image guided LP ordered 5/8 LP done with purulent CSF, studies consistent with bacterial meningitis, culture growing gm + cocci 5/10 significant rise in sodium with massive urine output of greater than 7 L leading to concern for development of central DI.  DDAVP and volume resuscitation started 5/11 hypernatremia slowly downtrending, continue DDAVP and IV resuscitation per nephrology 5/12 Sodium downtrended to 147 this am 5/13 to OR for posterior cervical decompressive laminectomy and medial facetectomy C3-T1 for evacuation of epirudal abscess, posterior cervical arthrodesis C3-C7, segmental fixation C3-7 5/18 OR for thoracolumbar laminectomy and decompression of epidural abscess from T-L spine 5/19 extubated to [facility] 5/24 reintubated 5/25 chest tube. MRI with ongoing meningitis + new area of ischemia in central pons 5/26 Tracheostomy created. 5/30 Tolerating TCT x 48H. Ongoing swallow dysfunction, SLP following. Unable to tolerate PMV. Likely will need PEG. WOC assessed rectal wound.  Interim History / Subjective:  On trach collar appears to be tolerating this well Still with thick secretions  Objective:  Blood pressure 130/79, pulse (!) 123, temperature 98.8 F (37.1 C), temperature source Oral, resp. rate (!) 22, height '5\' 4"'$  (1.626 m), weight 129.3 kg, SpO2 100 %.    FiO2 (%):  [28 %] 28 %   Intake/Output Summary (Last 24 hours) at 03/19/2022 0932 Last data filed at 03/19/2022 0813 Gross per 24 hour  Intake 2339.14 ml  Output 6950 ml  Net -4610.86  ml   [employer]  03/15/22 0500 03/16/22 0500 03/18/22 0500  Weight: 95.1 kg 94.9 kg 129.3 kg   Physical Examination: General: Chronically ill-appearing female, lying on the bed HEENT: Moist oral mucosa, Shiley #6 cuffed trach in place  Neuro: Awake and responsive, bilateral upper extremity antigravity, no movement to painful stimuli in lower extremities CV: S1-S2 appreciated coarse breath sounds PULM: Coarse breath sounds, rhonchi GI: Soft, nontender Extremities: Bilateral lower extremity edema  Resolved medical problems  AKI due to septic ATN, resolved NAGMA - resolved  Anasarca with hypervolemia Shock liver superimposed on known fatty liver disease Septic shock  Assessment & Plan:   Acute hypoxic respiratory failure s/p tracheostomy Aspiration pneumonia Right upper lobe nodule Concerning for malignancy Continue trach collar as long as patient can tolerate Try inline PMV Not a candidate for decannulation   Spontaneous right pneumothorax, resolved  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

## 2022-03-19 NOTE — Progress Notes (Signed)
Physical Therapy Treatment Patient Details Name: Gabrielle Vincent MRN: [id number] DOB: 07/24/1966 Today's Date: 03/19/2022   History of Present Illness 56 y.o. female presents to [facility] hospital on 02/14/2022 with complaints of severe back pain associated with hematuria and dysuria. Pt admitted for management of UTI and AKI. MRI demonstrates small herniated nucleus pulposus at L3-L4 on the left side. 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 Pt intubated 02/16/2022. Cultures positive for meningitis. MRI 02/23/2022 demonstrates diffuse epidural abscess with cord compression and likely signal change. Pt underwent C3-T1 decompressive laminectomy with epidural abscess evaucation along with C3-7 fusion on 5/13. MRI 5/16 demonstrates Extensive thoracic epidural abscess. 5/18- Pt underwent Thoracolumbar laminectomy and decompression of epidural abscess. Extubated 5/19. 5/24 reintubated, 5/25 chest tube placement and MRI with new area of ischemia in central pons. Trachostomy on 5/26. PMH includes obesity, HLD, diverticulosis.    PT Comments    Pt alert and eager to participate in therapy today. HOB elevated to chair position, then +2 total assist using bed pad to pivot to sitting EOB. Mod assist (with therapist posterior to pt) to maintain sitting balance x 15 minutes with pt participating in dynamic activities with OT. HOB again elevated and +2 total assist to return to supine.   Recommendations for follow up therapy are one component of a multi-disciplinary discharge planning process, led by the attending physician.  Recommendations may be updated based on patient status, additional functional criteria and insurance authorization.  Follow Up Recommendations  Skilled nursing-short term rehab (<3 hours/day)     Assistance Recommended at Discharge Frequent or constant Supervision/Assistance  Patient can return home with the following Two people to help with walking and/or transfers;Two  people to help with bathing/dressing/bathroom;Assistance with cooking/housework;Assistance with feeding;Assist for transportation;Help with stairs or ramp for entrance;Direct supervision/assist for medications management;Direct supervision/assist for financial management   Equipment Recommendations  Wheelchair (measurements PT);Wheelchair cushion (measurements PT);Hospital bed;Other (comment)    Recommendations for Other Services       Precautions / Restrictions Precautions Precautions: Fall;Back Precaution Booklet Issued: No Precaution Comments: Reviewed precautions Required Braces or Orthoses: Other Brace Other Brace: no brace needed per orders Restrictions Weight Bearing Restrictions: No     Mobility  Bed Mobility Overal bed mobility: Needs Assistance Bed Mobility: Supine to Sit, Sit to Supine     Supine to sit: Total assist, +2 for physical assistance, +2 for safety/equipment, HOB elevated Sit to supine: Total assist, +2 for physical assistance, +2 for safety/equipment, HOB elevated   General bed mobility comments: HOB elevated to chair position then helicopter method using bed pad to pivot to EOB. HOB again elevated for return to supine.    Transfers                        Ambulation/Gait                   Stairs             Wheelchair Mobility    Modified Rankin (Stroke Patients Only)       Balance Overall balance assessment: Needs assistance Sitting-balance support: No upper extremity supported, Feet supported Sitting balance-Leahy Scale: Poor Sitting balance - Comments: Mod A for dyanmic sitting balance. Pt sat EOB x 15 minutes.  Cognition Arousal/Alertness: Awake/alert Behavior During Therapy: WFL for tasks assessed/performed Overall Cognitive Status: Difficult to assess                                 General Comments: Pt more awake and alert this session. Very  motivated to participate in therapy despite discomfort and pain. Pt following one step commands.        Exercises      General Comments General comments (skin integrity, edema, etc.): HR in 120s. BP stable. SpO2 in 90s on trach collar.      Pertinent Vitals/Pain Pain Assessment Pain Assessment: Faces Faces Pain Scale: Hurts little more Pain Location: Grimacing with moveement of BUE/BLE Pain Descriptors / Indicators: Grimacing, Tingling Pain Intervention(s): Monitored during session, Repositioned, Limited activity within patient's tolerance    Home Living                          Prior Function            PT Goals (current goals can now be found in the care plan section) Acute Rehab PT Goals Patient Stated Goal: get stronger Progress towards PT goals: Progressing toward goals    Frequency    Min 3X/week      PT Plan Current plan remains appropriate    Co-evaluation PT/OT/SLP Co-Evaluation/Treatment: Yes Reason for Co-Treatment: For patient/therapist safety;Complexity of the patient's impairments (multi-system involvement);To address functional/ADL transfers PT goals addressed during session: Mobility/safety with mobility;Balance OT goals addressed during session: ADL's and self-care      AM-PAC PT "6 Clicks" Mobility   Outcome Measure  Help needed turning from your back to your side while in a flat bed without using bedrails?: Total Help needed moving from lying on your back to sitting on the side of a flat bed without using bedrails?: Total Help needed moving to and from a bed to a chair (including a wheelchair)?: Total Help needed standing up from a chair using your arms (e.g., wheelchair or bedside chair)?: Total Help needed to walk in hospital room?: Total Help needed climbing 3-5 steps with a railing? : Total 6 Click Score: 6    End of Session Equipment Utilized During Treatment: Oxygen;Other (comment) (bilat PRAFOs in bed) Activity  Tolerance: Patient tolerated treatment well Patient left: in bed;with call bell/phone within reach;with family/visitor present Nurse Communication: Mobility status PT Visit Diagnosis: Other abnormalities of gait and mobility (R26.89);Other symptoms and signs involving the nervous system (R29.898);Pain     Time: 8099-8338 PT Time Calculation (min) (ACUTE ONLY): 32 min  Charges:  $Therapeutic Activity: 8-22 mins                     Lorrin Goodell, PT  Office # [phone] Pager [phone]    Lorriane Shire 03/19/2022, 11:25 AM

## 2022-03-20 DIAGNOSIS — M545 Low back pain, unspecified: Secondary | ICD-10-CM | POA: Diagnosis not present

## 2022-03-20 DIAGNOSIS — G009 Bacterial meningitis, unspecified: Secondary | ICD-10-CM | POA: Diagnosis not present

## 2022-03-20 DIAGNOSIS — G061 Intraspinal abscess and granuloma: Secondary | ICD-10-CM | POA: Diagnosis not present

## 2022-03-20 DIAGNOSIS — J9601 Acute respiratory failure with hypoxia: Secondary | ICD-10-CM | POA: Diagnosis not present

## 2022-03-20 LAB — CBC
HCT: 33.1 % — ABNORMAL LOW (ref 36.0–46.0)
Hemoglobin: 9.5 g/dL — ABNORMAL LOW (ref 12.0–15.0)
MCH: 28.8 pg (ref 26.0–34.0)
MCHC: 28.7 g/dL — ABNORMAL LOW (ref 30.0–36.0)
MCV: 100.3 fL — ABNORMAL HIGH (ref 80.0–100.0)
Platelets: 197 10*3/uL (ref 150–400)
RBC: 3.3 MIL/uL — ABNORMAL LOW (ref 3.87–5.11)
RDW: 19.3 % — ABNORMAL HIGH (ref 11.5–15.5)
WBC: 10.3 10*3/uL (ref 4.0–10.5)
nRBC: 0.4 % — ABNORMAL HIGH (ref 0.0–0.2)

## 2022-03-20 LAB — GLUCOSE, CAPILLARY
Glucose-Capillary: 124 mg/dL — ABNORMAL HIGH (ref 70–99)
Glucose-Capillary: 140 mg/dL — ABNORMAL HIGH (ref 70–99)
Glucose-Capillary: 153 mg/dL — ABNORMAL HIGH (ref 70–99)
Glucose-Capillary: 167 mg/dL — ABNORMAL HIGH (ref 70–99)
Glucose-Capillary: 189 mg/dL — ABNORMAL HIGH (ref 70–99)
Glucose-Capillary: 225 mg/dL — ABNORMAL HIGH (ref 70–99)

## 2022-03-20 LAB — OSMOLALITY, URINE: Osmolality, Ur: 218 mOsm/kg — ABNORMAL LOW (ref 300–900)

## 2022-03-20 LAB — BASIC METABOLIC PANEL
Anion gap: 8 (ref 5–15)
BUN: 37 mg/dL — ABNORMAL HIGH (ref 6–20)
CO2: 25 mmol/L (ref 22–32)
Calcium: 8.9 mg/dL (ref 8.9–10.3)
Chloride: 120 mmol/L — ABNORMAL HIGH (ref 98–111)
Creatinine, Ser: 0.81 mg/dL (ref 0.44–1.00)
GFR, Estimated: >60 mL/min (ref >60)
Glucose, Bld: 189 mg/dL — ABNORMAL HIGH (ref 70–99)
Potassium: 4 mmol/L (ref 3.5–5.1)
Sodium: 153 mmol/L — ABNORMAL HIGH (ref 135–145)

## 2022-03-20 LAB — SODIUM
Sodium: 152 mmol/L — ABNORMAL HIGH (ref 135–145)
Sodium: 152 mmol/L — ABNORMAL HIGH (ref 135–145)
Sodium: 151 mmol/L — ABNORMAL HIGH (ref 135–145)
Sodium: 149 mmol/L — ABNORMAL HIGH (ref 135–145)

## 2022-03-20 LAB — MAGNESIUM: Magnesium: 2.5 mg/dL — ABNORMAL HIGH (ref 1.7–2.4)

## 2022-03-20 MED ORDER — MIDODRINE HCL 5 MG PO TABS
5.0000 mg | ORAL_TABLET | Freq: Three times a day (TID) | ORAL | Status: DC
  Administered 2022-03-20 – 2022-03-21 (×4): 5 mg
  Filled 2022-03-20 (×4): qty 1

## 2022-03-20 MED ORDER — DESMOPRESSIN ACETATE 4 MCG/ML IJ SOLN
1.0000 ug | Freq: Two times a day (BID) | INTRAMUSCULAR | Status: DC
  Administered 2022-03-20: 1 ug via INTRAVENOUS
  Filled 2022-03-20 (×2): qty 1

## 2022-03-20 MED ORDER — ORAL CARE MOUTH RINSE
15.0000 mL | Freq: Two times a day (BID) | OROMUCOSAL | Status: DC
  Administered 2022-03-20 – 2022-03-21 (×3): 15 mL via OROMUCOSAL

## 2022-03-20 NOTE — Progress Notes (Signed)
Dr. Louanne Belton notified of 15 beat run of vtach.

## 2022-03-20 NOTE — Progress Notes (Signed)
Nutrition Follow-up  DOCUMENTATION CODES:   Obesity unspecified  INTERVENTION:  Tube feeding via cortrak tube:   237 ml Jevity 1.5 TID (0800, 1200, 1600) 474 ml Jevity daily (2000)   50 ml free water flush before and after each bolus feeding   Provides 1935 kcal, 119 gm protein, 900 ml free water daily   Juven BID  NUTRITION DIAGNOSIS:   Inadequate oral intake related to inability to eat as evidenced by NPO status.  Ongoing  GOAL:   Provide needs based on ASPEN/SCCM guidelines  Goal met  MONITOR:   Vent status, TF tolerance, Labs, Weight trends, Skin  REASON FOR ASSESSMENT:   Consult Enteral/tube feeding initiation and management  ASSESSMENT:   Pt with PMH of aortic atherosclerosis, obesity, hepatic steatosis, HLD, and lymphnode drainage surgery admitted with acute severe lower back pain radiating to the LE, radiculopathy, AKI, and acute transaminates.   5/3 admitted with back pain and UTI 5/5 intubated with progressive encephalopathy 5/8 s/p LP consistent with bacterial meningitis 5/10 developed DI due to meningitis  5/13 s/p OR for decompressive laminectomy and evacuation of epidural abscess 5/18 s/p lami and decompression of epidural abscess  5/19 extubated 5/22 s/p cortrak placement; tip in distal stomach  5/24 re-intubated; TF held 5/26 TF restarted; trach placed  6/5: FEES, continued PMV trials  SLP following with ongoing swallowing trials and utilization of PMV. Recommend increasing PMV trials and trials of purees and nectar thick liquids when alert and PMV donned. If tolerates well, could consider starting dysphagia 1/thick.  Noted plans for PEG tube placement likely on 6/9  Repeat MRI/spine on 06/02. Noted dural thickening of lumbar spine and evidence of osteomyelitis on posterior elements at L5 and L6. No planned interventions at this time.   Pt noted to have had spontaneous R pneumothorax which has since resolved.  Pt in good spirits, smiling  and attempting to speak during visit. Her daughter was at bedside. Pt expressed excitement over trials of pudding and cranberry juice today. Tolerating tube feeding well and expressing increase in hunger and appetite.   Admission weight: 95.3 kg Current weight: 122.5 kg  Non-pitting edema  Question accuracy of current wt as pt transferred to new unit. Will continue to monitor throughout admission.    Medications: SSI 0-15 units Q4h, SSI 8 units Q4h, semglee 25 units BID, protonix,  IV drips: rocephin, D5 @ 129m/hr   Labs: sodium 153, BUN 37, Mg 2.5, CBG's 124-248 x24 hours  UOP: 5840 ml x24 hours I/O's: -70072msince admission  Diet Order:   Diet Order             Diet NPO time specified  Diet effective now                   EDUCATION NEEDS:   No education needs have been identified at this time  Skin:  Skin Assessment: Skin Integrity Issues: Skin Integrity Issues:: Unstageable DTI: anus, L heel Stage I: R heel Unstageable: R lower head  Last BM:  6/4 (type 6 medium)  Height:   Ht Readings from Last 1 Encounters:  03/07/22 5' 4"  (1.626 m)    Weight:   Wt Readings from Last 1 Encounters:  03/20/22 122.5 kg    Ideal Body Weight:  54.54 kg  BMI:  Body mass index is 46.35 kg/m.  Estimated Nutritional Needs:   Kcal:  1850-2100  Protein:  110-120 grams  Fluid:  >1.8 L/day  AlClayborne DanaRDN, LDN Clinical Nutrition

## 2022-03-20 NOTE — Consult Note (Signed)
Reason for Consult: Hypernatremia Referring Physician: Louanne Belton, MD  Gabrielle Vincent is an 56 y.o. female with past medical history of obesity and allergies who was admitted on 02/14/22 with back pain and found to have AKI with Scr of 3.6.  She was diagnosed with E. Coli UTI which progressed to meningitis.  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  She underwent thoracolumbar laminectomy and decompression of epidural abscess on 02/24/22 and 03/01/22.  She also had an acute herniated disc at L3-4.  Pt failed extubation and underwent tracheostomy on 03/09/22.  She developed hypernatremia and we were consulted and diagnosed her with central diabetes insipidus (please see consult dated 02/21/22.  She was started on ddAVP 6mcg IV bid but eventually required 2 mcg IV q6 with free water and improved to normal on 03/11/22.  She was weaned off of ddVAP and taken off of it on 03/13/22.  We were re-consulted due to recurrence of polyuria and hypernatremia despite high dose free water flushes of 200 mL every 2 hours and D50 at  100 ml/hr.  The trend in serum sodium is seen below.  She denies any N/V/D, SOB, edema, or chest pain.  Trend in Serum Sodium: Sodium  Date/Time Value Ref Range Status  03/20/2022 03:21 AM 153 (H) 135 - 145 mmol/L Final  03/19/2022 05:29 AM 158 (H) 135 - 145 mmol/L Final  03/18/2022 02:59 AM 151 (H) 135 - 145 mmol/L Final  03/17/2022 01:08 AM 155 (H) 135 - 145 mmol/L Final  03/16/2022 09:14 AM 162 (HH) 135 - 145 mmol/L Final  03/16/2022 03:52 AM 162 (HH) 135 - 145 mmol/L Final  03/15/2022 03:02 AM 157 (H) 135 - 145 mmol/L Final  03/14/2022 03:58 AM 154 (H) 135 - 145 mmol/L Final  03/12/2022 02:58 AM 133 (L) 135 - 145 mmol/L Final  03/11/2022 01:29 AM 138 135 - 145 mmol/L Final  03/10/2022 03:38 AM 146 (H) 135 - 145 mmol/L Final  03/09/2022 05:43 AM 153 (H) 135 - 145 mmol/L Final  03/08/2022 04:47 AM 159 (H) 135 - 145 mmol/L Final  03/07/2022 07:45 PM  156 (H) 135 - 145 mmol/L Final  03/07/2022 01:22 AM 147 (H) 135 - 145 mmol/L Final  03/06/2022 03:59 PM 150 (H) 135 - 145 mmol/L Final  03/06/2022 04:48 AM 150 (H) 135 - 145 mmol/L Final  03/05/2022 03:28 PM 150 (H) 135 - 145 mmol/L Final  03/05/2022 08:06 AM 155 (H) 135 - 145 mmol/L Final  03/05/2022 02:31 AM 154 (H) 135 - 145 mmol/L Final  03/04/2022 02:52 PM 155 (H) 135 - 145 mmol/L Final  03/04/2022 06:57 AM 158 (H) 135 - 145 mmol/L Final  03/03/2022 05:04 PM 160 (H) 135 - 145 mmol/L Final  03/03/2022 08:19 AM 161 (HH) 135 - 145 mmol/L Corrected  03/03/2022 01:10 AM 160 (H) 135 - 145 mmol/L Final  03/02/2022 04:08 PM 154 (H) 135 - 145 mmol/L Final  03/02/2022 08:23 AM 152 (H) 135 - 145 mmol/L Final  03/02/2022 08:23 AM 151 (H) 135 - 145 mmol/L Final  03/02/2022 05:03 AM 150 (H) 135 - 145 mmol/L Final  03/01/2022 09:19 PM 147 (H) 135 - 145 mmol/L Final  03/01/2022 04:20 PM 147 (H) 135 - 145 mmol/L Final  03/01/2022 12:10 PM 147 (H) 135 - 145 mmol/L Final  03/01/2022 08:34 AM 144 135 - 145 mmol/L Final  03/01/2022 05:52 AM 144 135 - 145 mmol/L Final  03/01/2022 12:21 AM 144 135 - 145  mmol/L Final  02/28/2022 08:20 PM 137 135 - 145 mmol/L Final  02/28/2022 03:47 PM 142 135 - 145 mmol/L Final  02/28/2022 11:46 AM 144 135 - 145 mmol/L Final  02/28/2022 08:05 AM 144 135 - 145 mmol/L Final  02/28/2022 05:34 AM 144 135 - 145 mmol/L Final  02/28/2022 12:29 AM 146 (H) 135 - 145 mmol/L Final  02/27/2022 08:22 PM 146 (H) 135 - 145 mmol/L Final  02/27/2022 05:36 PM 148 (H) 135 - 145 mmol/L Final  02/27/2022 11:40 AM 132 (L) 135 - 145 mmol/L Final  02/27/2022 07:50 AM 149 (H) 135 - 145 mmol/L Final  02/27/2022 04:31 AM 147 (H) 135 - 145 mmol/L Final  02/26/2022 11:46 PM 149 (H) 135 - 145 mmol/L Final  02/26/2022 07:54 PM 148 (H) 135 - 145 mmol/L Final  02/26/2022 04:39 PM 149 (H) 135 - 145 mmol/L Final  02/26/2022 01:29 PM 149 (H) 135 - 145 mmol/L Final  02/26/2022 11:10 AM 151 (H) 135 -  145 mmol/L Final  02/26/2022 09:36 AM 149 (H) 135 - 145 mmol/L Final    PMH:   Past Medical History:  Diagnosis Date   AKI (acute kidney injury) ([place]) 02/14/2022   Aortic atherosclerosis ([place]) 02/14/2022   Class II obesity 02/14/2022   Diverticulosis 02/14/2022   Hepatic steatosis 02/14/2022   Hyperlipidemia 02/07/2016   Seasonal allergies    Swelling of lower extremity    bilateral   Varicose veins    right leg    PSH:   Past Surgical History:  Procedure Laterality Date   LUMBAR LAMINECTOMY/DECOMPRESSION MICRODISCECTOMY N/A 03/01/2022   Procedure: Thoracic eleven - twelve Laminectomy with drainage of epidural abscess;  Surgeon: Kristeen Miss, MD;  Location: [facility];  Service: Neurosurgery;  Laterality: N/A;   lymphnode drainage surgery     POSTERIOR CERVICAL FUSION/FORAMINOTOMY N/A 02/24/2022   Procedure: POSTERIOR CERVICAL LAMINECTOMY CERVICAL THREE-CERVICAL SEVEN WITH LATERAL MASS FUSION / FIXATION;  Surgeon: Eustace Moore, MD;  Location: [facility];  Service: Neurosurgery;  Laterality: N/A;    Allergies:  Allergies  Allergen Reactions   Erythromycin Anaphylaxis   Penicillins Nausea Only   Prednisone Other (See Comments)    Lymph node swelling    Medications:   Prior to Admission medications   Medication Sig Start Date End Date Taking? Authorizing Provider  ibuprofen (ADVIL) 200 MG tablet Take 200 mg by mouth every 6 (six) hours as needed.   Yes [provider]    Discontinued Meds:   Medications Discontinued During This Encounter  Medication Reason   lactated ringers infusion    methylPREDNISolone sodium succinate (SOLU-MEDROL) 40 mg/mL injection 40 mg    morphine (PF) 2 MG/ML injection 2 mg    oxyCODONE (Oxy IR/ROXICODONE) immediate release tablet 5 mg    methylPREDNISolone sodium succinate (SOLU-MEDROL) 40 mg/mL injection 40 mg    enoxaparin (LOVENOX) injection 30 mg    methocarbamol (ROBAXIN) 500 mg in dextrose 5 % 50 mL IVPB    morphine (PF) 2 MG/ML injection 1 mg     pregabalin (LYRICA) capsule 25 mg    LORazepam (ATIVAN) tablet 1 mg    methylPREDNISolone acetate (DEPO-MEDROL) 80 MG/ML injection Returned to ADS   lidocaine (PF) (XYLOCAINE) 1 % injection Returned to ADS   sodium chloride (PF) 0.9 % injection Returned to ADS   0.9 %  sodium chloride infusion    dextrose 5 % solution    cefTRIAXone (ROCEPHIN) 1 g in sodium chloride 0.9 % 100 mL IVPB  cefTRIAXone (ROCEPHIN) 2 g in sodium chloride 0.9 % 100 mL IVPB    metoprolol tartrate (LOPRESSOR) tablet 12.5 mg    pantoprazole (PROTONIX) EC tablet 40 mg    Urelle (URELLE/URISED) 81 MG tablet 81 mg    insulin aspart (novoLOG) injection 0-9 Units    0.45 % sodium chloride infusion    insulin aspart (novoLOG) injection 0-5 Units    docusate sodium (COLACE) capsule 100 mg    aspirin EC tablet 325 mg    pantoprazole (PROTONIX) injection 40 mg Change in therapy   pantoprazole sodium (PROTONIX) 40 mg/20 mL oral suspension 40 mg Change in therapy   dextrose 5 %-0.45 % sodium chloride infusion    MEDLINE mouth rinse Duplicate   aspirin tablet 325 mg    acetaminophen (TYLENOL) 160 MG/5ML solution 650 mg    pantoprazole sodium (PROTONIX) 40 mg/20 mL oral suspension 40 mg    Chlorhexidine Gluconate Cloth 2 % PADS 6 each    polyethylene glycol (MIRALAX / GLYCOLAX) packet 17 g    ceFEPIme (MAXIPIME) 2 g in sodium chloride 0.9 % 100 mL IVPB    ampicillin (OMNIPEN) 2 g in sodium chloride 0.9 % 100 mL IVPB    ceFAZolin (ANCEF) IVPB 2g/100 mL premix    aspirin suppository 300 mg    potassium chloride (KLOR-CON) packet 40 mEq    tamsulosin (FLOMAX) capsule 0.4 mg    ceFEPIme (MAXIPIME) 2 g in sodium chloride 0.9 % 100 mL IVPB    cefTRIAXone (ROCEPHIN) 2 g in sodium chloride 0.9 % 100 mL IVPB    feeding supplement (VITAL HIGH PROTEIN) liquid 1,000 mL    dextrose 5 % solution    norepinephrine (LEVOPHED) 16 mg in 298m premix infusion    lactated ringers infusion    free water 200 mL    acyclovir (ZOVIRAX)  550 mg in dextrose 5 % 250 mL IVPB    vancomycin (VANCOREADY) IVPB 1250 mg/250 mL    ampicillin (OMNIPEN) 2 g in sodium chloride 0.9 % 100 mL IVPB    ceFEPIme (MAXIPIME) 2 g in sodium chloride 0.9 % 100 mL IVPB    feeding supplement (OSMOLITE 1.2 CAL) liquid 1,000 mL    metoprolol tartrate (LOPRESSOR) injection 5 mg    free water 300 mL    fentaNYL 25027m in NS 25061m54m46ml) infusion-PREMIX    propofol (DIPRIVAN) 1000 MG/100ML infusion    feeding supplement (PROSource TF) liquid 45 mL    feeding supplement (OSMOLITE 1.2 CAL) liquid 1,000 mL    desmopressin (DDAVP) injection 1 mcg    dextrose 5 % solution    dextrose 5 % solution    dextrose 5 % solution    desmopressin (DDAVP) injection 2 mcg    dexmedetomidine (PRECEDEX) 200 MCG/50ML (4 mcg/mL) infusion    free water 400 mL    dextrose 5 % solution    phenylephrine 80 mcg/10 mL injection Returned to ADS   enoxaparin (LOVENOX) injection 40 mg    feeding supplement (VITAL HIGH PROTEIN) liquid 1,000 mL    chlorhexidine gluconate (MEDLINE KIT) (PERIDEX) 0.12 % solution 15 mL Duplicate   MEDLINE mouth rinse Duplicate   metoprolol tartrate (LOPRESSOR) injection 10 mg    bupivacaine (PF) (MARCAINE) 0.25 % injection Patient Discharge   Surgifoam 100 with Thrombin 20,000 units (20 ml) topical solution Patient Discharge   Surgifoam 1 Gm with Thrombin 5,000 units (5 ml) topical solution Patient Discharge   0.9 % irrigation (POUR BTL) Patient Discharge  insulin regular, human (MYXREDLIN) 100 units/ 100 mL infusion Completed Course   pantoprazole (PROTONIX) injection 40 mg P&T Policy: IV to PO Conversion   0.9 % NaCl with KCl 20 mEq/ L  infusion    free water 200 mL    norepinephrine (LEVOPHED) 16 mg in 262m premix infusion    insulin aspart (novoLOG) injection 2 Units    free water 200 mL    insulin aspart (novoLOG) injection 4 Units    morphine (PF) 2 MG/ML injection 2 mg    insulin aspart (novoLOG) injection 6 Units    insulin  aspart (novoLOG) injection 8 Units    insulin aspart (novoLOG) injection 6 Units    ibuprofen (ADVIL) 100 MG/5ML suspension 600 mg    insulin glargine-yfgn (SEMGLEE) injection 10 Units    vancomycin (VANCOCIN) IVPB 1000 mg/200 mL premix    feeding supplement (OSMOLITE 1.2 CAL) liquid 1,000 mL    feeding supplement (PROSource TF) liquid 45 mL    Surgifoam 100 with Thrombin 20,000 units (20 ml) topical solution Patient Discharge   Surgifoam 1 Gm with Thrombin 5,000 units (5 ml) topical solution Patient Discharge   lidocaine-EPINEPHrine (XYLOCAINE W/EPI) 1 %-1:100000 (with pres) injection Patient Discharge   bupivacaine(PF) (MARCAINE) 0.5 % injection Patient Discharge   0.9 % irrigation (POUR BTL) Patient Discharge   docusate sodium (COLACE) capsule 100 mg    ondansetron (ZOFRAN) tablet 4 mg    ondansetron (ZOFRAN) injection 4 mg    free water 200 mL    insulin glargine-yfgn (SEMGLEE) injection 12 Units    sodium phosphate (FLEET) 7-19 GM/118ML enema 1 enema    acetaminophen (TYLENOL) tablet 650 mg    acetaminophen (TYLENOL) suppository 650 mg    sodium chloride flush (NS) 0.9 % injection 3 mL    sodium chloride flush (NS) 0.9 % injection 3 mL    0.9 %  sodium chloride infusion    fentaNYL (SUBLIMAZE) bolus via infusion 50-100 mcg    midazolam (VERSED) injection 0-2 mg    fentaNYL 25081m in NS 25079m11m38ml) infusion-PREMIX    dexmedetomidine (PRECEDEX) 400 MCG/100ML (4 mcg/mL) infusion    desmopressin (DDAVP) tablet 0.05 mg    diphenhydrAMINE (BENADRYL) 25 MG tablet Completed Course   predniSONE (STERAPRED UNI-PAK 21 TAB) 10 MG (21) TBPK tablet Completed Course   ranitidine (ZANTAC) 150 MG tablet Completed Course   insulin glargine-yfgn (SEMGLEE) injection 20 Units    insulin glargine-yfgn (SEMGLEE) injection 40 Units    potassium chloride (KLOR-CON) packet 40 mEq Duplicate   hydrALAZINE (APRESOLINE) injection 10-40 mg    norepinephrine (LEVOPHED) 4mg 79m250mL 2m16 mg/mL) premix  infusion    free water 200 mL    midodrine (PROAMATINE) tablet 5 mg    oxyCODONE (Oxy IR/ROXICODONE) immediate release tablet 5 mg    ondansetron (ZOFRAN) tablet 4 mg    insulin glargine-yfgn (SEMGLEE) injection 30 Units    polyethylene glycol (MIRALAX / GLYCOLAX) packet 17 g    docusate (COLACE) 50 MG/5ML liquid 100 mg    polyethylene glycol (MIRALAX / GLYCOLAX) packet 17 g    desmopressin (DDAVP) tablet 0.05 mg    ketamine HCl 50 MG/5ML SOSY Returned to ADS   succinylcholine (ANECTINE) 200 MG/10ML syringe Returned to ADS   fentaNYL (SUBLIMAZE) 50 MCG/ML injection Returned to ADS   midazolam (VERSED) 2 MG/2ML injection Returned to ADS   sodium chloride flush (NS) 0.9 % injection 10-40 75-17plicate   sodium chloride flush (NS) 0.9 % injection 10-40 mL  Duplicate   sodium chloride flush (NS) 0.9 % injection 3 mL Duplicate   sodium chloride flush (NS) 0.9 % injection 3 mL Duplicate   0.9 %  sodium chloride infusion Duplicate   desmopressin (DDAVP) tablet 0.05 mg    insulin glargine-yfgn (SEMGLEE) injection 28 Units    feeding supplement (JEVITY 1.5 CAL/FIBER) liquid 1,000 mL    feeding supplement (PROSource TF) liquid 90 mL    feeding supplement (JEVITY 1.5 CAL/FIBER) liquid 1,000 mL    zinc oxide (BALMEX) 11.3 % cream    midodrine (PROAMATINE) tablet 10 mg    insulin glargine-yfgn (SEMGLEE) injection 14 Units    senna (SENOKOT) tablet 8.6 mg    midodrine (PROAMATINE) tablet 20 mg    fentaNYL 2567mg in NS 2529m(106mml) infusion-PREMIX    0.9 %  sodium chloride infusion    norepinephrine (LEVOPHED) 4mg62m 250mL62m016 mg/mL) premix infusion    fentaNYL (SUBLIMAZE) injection 25 mcg    desmopressin (DDAVP) tablet 0.1 mg    free water 300 mL    desmopressin (DDAVP) tablet 0.05 mg    desmopressin (DDAVP) tablet 0.05 mg Discontinued by provider   free water 200 mL Discontinued by provider   feeding supplement (OSMOLITE 1.5 CAL) liquid 1,000 mL    acetaminophen (TYLENOL) 160 MG/5ML  solution 500 mg    insulin glargine-yfgn (SEMGLEE) injection 22 Units    acetaminophen (TYLENOL) tablet 650 mg    acetaminophen (TYLENOL) suppository 650 mg    feeding supplement (JEVITY 1.5 CAL/FIBER) liquid 1,000 mL    acetaminophen (TYLENOL) tablet 650 mg    acetaminophen (TYLENOL) suppository 650 mg    feeding supplement (JEVITY 1.5 CAL/FIBER) liquid 474 mL    free water 100 mL    free water 100 mL    dextrose 5 % solution    aspirin chewable tablet 81 mg    free water 150 mL    midodrine (PROAMATINE) tablet 20 mg    MEDLINE mouth rinse    midodrine (PROAMATINE) tablet 10 mg     Social History:  reports that she has never smoked. She does not have any smokeless tobacco history on file. She reports that she does not drink alcohol and does not use drugs.  Family History:  History reviewed. No pertinent family history.  A comprehensive review of systems was negative.  Blood pressure (!) 141/71, pulse (!) 120, temperature 98.6 F (37 C), temperature source Axillary, resp. rate (!) 21, height _0  (1.626 m), weight 122.5 kg, SpO2 94 %. General appearance: lying in bed on vent via trach, awake and alert Head: Normocephalic, without obvious abnormality, atraumatic Resp: occ rhonchi bilaterally Cardio: tachycardic GI: soft, non-tender; bowel sounds normal; no masses,  no organomegaly Extremities: extremities normal, atraumatic, no cyanosis or edema  Labs: Basic Metabolic Panel: Recent Labs  Lab 03/14/22 0358 03/15/22 0302 03/16/22 0352 03/16/22 0914 03/17/22 0108 03/18/22 0259 03/19/22 0529 03/20/22 0321  NA 154* 157* 162* 162* 155* 151* 158* 153*  K 4.0 4.0 4.1  --  3.1* 4.5 3.9 4.0  CL 119* 124* 127*  --  122* 119* 122* 120*  CO2 _1 --  _2 GLUCOSE 122* 130* 107*  --  224* 137* 158* 189*  BUN 26* 38* 40*  --  44* 33* 32* 37*  CREATININE 0.87 1.05* 1.05*  --  0.94 0.83 0.90 0.81  ALBUMIN  --  2.4* 2.4*  --  2.3*  --  2.7*  --  CALCIUM 8.9 8.8* 8.6*   --  8.2* 8.7* 9.3 8.9   Liver Function Tests: Recent Labs  Lab 03/16/22 0352 03/17/22 0108 03/19/22 0529  AST _0 ALT _1 ALKPHOS 105 95 98  BILITOT 0.4 0.3 0.5  PROT 7.3 6.7 7.7  ALBUMIN 2.4* 2.3* 2.7*   No results for input(s): LIPASE, AMYLASE in the last 168 hours. No results for input(s): AMMONIA in the last 168 hours. CBC: Recent Labs  Lab 03/14/22 0358 03/15/22 0302 03/17/22 0108 03/18/22 0259 03/19/22 0529 03/20/22 0321  WBC 11.9*   < > 12.0* 10.1 9.9 10.3  NEUTROABS 7.7  --   --   --   --   --   HGB 9.5*   < > 8.8* 9.2* 10.6* 9.5*  HCT 31.5*   < > 30.2* 30.9* 35.0* 33.1*  MCV 95.7   < > 100.0 97.8 98.3 100.3*  PLT 236   < > 236 254 235 197   < > = values in this interval not displayed.   PT/INR: _2 (inr:5) Cardiac Enzymes: No results for input(s): CKTOTAL, CKMB, CKMBINDEX, TROPONINI in the last 168 hours. CBG: Recent Labs  Lab 03/19/22 2012 03/19/22 2331 03/20/22 0411 03/20/22 0816 03/20/22 1242  GLUCAP 139* 239* 167* 124* 189*    Iron Studies: No results for input(s): IRON, TIBC, TRANSFERRIN, FERRITIN in the last 168 hours.  Xrays/Other Studies: No results found.   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  Will restart DDAVP at 42mg IV BID and follow sodium levels every 2 hours.  Continue with FWF at 200 ml every 2 hours and D5 at 100 mL/hr for now.  If sodium starts to drop would stop D5 and eventually decrease FWF.  Will follow sodium and adjust DDAVP dose based on response but will likely need it long-term for now given that she only had 1 normal sodium level since 03/01/22.  Will check Uosm now and follow response to DDAVP. E. 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  ID following. Incomplete  quadriplegia VDRF  - s/p trach, per PCCM AKI resolved Rectal wound - wound care following. Urinary retention - failed voiding trial and now with foley catheter   JDonetta Potts6/03/2022, 3:58 PM

## 2022-03-20 NOTE — Progress Notes (Signed)
PROGRESS NOTE    Gabrielle Vincent  IZT:[id number] DOB: [birth year] DOA: 02/14/2022 PCP: Merryl Hacker, No    Brief Narrative:  Gabrielle Vincent is a 56 y.o. female with history of allergies, obesity presented to hospital with complaint of severe low back pain radiating to her  lower extremity with hematuria and dysuria.  In the ED, patient was noted to be hyponatremic and hypokalemic with elevated anion gap of 19 and creatinine at 3.6.  She had leukocytosis with WBC at 21.3.  CT renal study did not show obstructive uropathy but hepatomegaly cholecystolithiasis.  Patient was then admitted hospital for acute or severe lower back pain with radiculopathy, acute kidney injury, UTI, elevated LFTs, electrolyte imbalances.  During hospitalization, patient had worsening confusion and encephalopathy and had difficulty with airway protection.  Patient was empirically started on antibiotics for meningitis and imaging guided lumbar puncture was performed which showed a purulent CSF consistent with bacterial meningitis.  MRI of the lumbar spine was then ordered and was noted to have multiple epidural abscesses and underwent decompressive laminectomies on 02/24/2022 and 03/01/2022.  Patient also was noted to have acute herniated disc L3-L4 with lumbar radiculopathy.   Patient was initially extubated on 03/02/22 but on 03/07/2022 had to be reintubated. Hospital course was complicated by septic shock, central diabetes insipidus and recent prolonged intubation which required tracheostomy on 03/09/2022.  Patient had tolerated the trach collar for 48 hours and was transferred out of the ICU.  Patient has had a prolonged hospitalization due to ongoing fevers and  epidural infection.  Neurosurgery and ID has been following the patient at this time.  Another issue is with nutrition.  Currently on cortrak tube tube.  Will need PEG tube placement.  Aspirin on hold and  PEG tube placement likely on 03/23/2022 and awaiting hypernatremia improvement.  Will  consult nephrology for hypernatremia..  Assessment and plan  Principal Problem:   Abscess in epidural space of cervical spine Active Problems:   Hypokalemia   Class II obesity   Encephalopathy acute   Acute respiratory failure (HCC)   E. coli UTI   Bacterial meningitis   Diabetes insipidus ([place])   H/O excision of lamina of cervical vertebra for decompression of spinal cord   E coli bacteremia   Pressure injury of skin   Hypernatremia   Acute metabolic encephalopathy Secondary to sepsis and infection.  Following commands and comprehension is intact.  Improved.  E. coli complicated UTI with extensive epidural spinal abscess/Bacterial meningitis  Status post C3-T1 laminectomy, evacuation of abscess, thoracolumbar laminectomy/decompression by neurosurgery. Neurosurgery and infectious disease on board..  On IV Rocephin  twice daily.   Blood culture from 03/14/22 negative in 5 days.  Repeat MRI of the cervical lumbar and thoracic spine was performed and was reviewed by neurosurgery on 03/16/2022.  No plans for neurosurgical intervention at this time.  Temperature max of 99.4 F.  WBC has improved to 10.3.  Plan is to continue with IV antibiotics at this time.  Sinus tachycardia.  Improved after decreasing midodrine dose to 10 mg 3 times daily from 20 mg 3 times daily.  We will decrease midodrine dose to 5 mg 3 times daily.  Could ultimately discontinue.  Shock Resolved at this time.  Leukocytosis has resolved.  Still on midodrine.  Continue to taper and discontinue.  Hypokalemia.  Improved after replacement.  Latest potassium 4.0.  Acute hypoxic respiratory failure s/p tracheostomy Aspiration pneumonia Incidental finding of right upper lobe 2.2 mm spiculated appearing mass concerning for  malignancy Spontaneous right pneumothorax-resolved Continue trach collar.  Patient has been  followed by PCCM.  Still on Rocephin antibiotic for epidural abscess.   Central diabetes  insipidus hypernatremia. Patient received DDAVP during hospitalization.  Latest sodium of  153 from 158.  Patient is currently on water flushes 200 mL every 2 hours and D5 water at 100 ml per hour.  Continue to monitor BMP.  Appears that patient is negative balance for four 4423 ml with significant urinary output.  We will consult nephrology for assistance.  Acute kidney injury Improved at this time.  Latest creatinine of 0.9.   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  Interventional radiology on board.  Type 2 diabetes mellitus. -Newly diagnosed.  Hemoglobin A1c of 8.9.  Continue sliding scale and long-acting insulin at this time.  Latest POC glucose of 132  Nutrition.  Currently on cortrak tube tube nutrition.  IR has been consulted for PEG tube placement.  Improving leukocytosis and fever.  We will continue to hold aspirin.  We will continue cortrak tube tube feeding in the meantime.  Rectal wound secondary to continuous incontinence and skin breakdown. Continue wound care.  Anemia of critical illness Latest hemoglobin of 9.5, will transfuse for hemoglobin less than 7.  Acute urinary retention.  Has failed voiding trial.  Continue Foley catheter  Pressure injury.  We will continue wound care. Pressure Injury 03/06/22 Anus Lower;Mid Deep Tissue Pressure Injury - Purple or maroon localized area of discolored intact skin or blood-filled blister due to damage of underlying soft tissue from pressure and/or shear. raised purple area at base of rect (Active)  03/06/22 1730  Location: Anus  Location Orientation: Lower;Mid  Staging: Deep Tissue Pressure Injury - Purple or maroon localized area of discolored intact skin or blood-filled blister due to damage of underlying soft tissue from pressure and/or shear.  Wound Description (Comments): raised purple area at  base of rectum  Present on Admission: No     Pressure Injury 03/13/22 Heel Right Stage 1 -  Intact skin with non-blanchable redness of a localized area usually over a bony prominence. (Active)  03/13/22 0948  Location: Heel  Location Orientation: Right  Staging: Stage 1 -  Intact skin with non-blanchable redness of a localized area usually over a bony prominence.  Wound Description (Comments):   Present on Admission: No     Pressure Injury 03/13/22 Heel Left Deep Tissue Pressure Injury - Purple or maroon localized area of discolored intact skin or blood-filled blister due to damage of underlying soft tissue from pressure and/or shear. (Active)  03/13/22 0948  Location: Heel  Location Orientation: Left  Staging: Deep Tissue Pressure Injury - Purple or maroon localized area of discolored intact skin or blood-filled blister due to damage of underlying soft tissue from pressure and/or shear.  Wound Description (Comments):   Present on Admission: No   Extreme debility, deconditioning  Patient is a good candidate for LTAC.  TOC involved in disposition.   DVT prophylaxis: SCD's Start: 03/01/22 2003 Place TED hose Start: 03/01/22 0851 heparin injection 5,000 Units Start: 02/26/22 1400 Place and maintain sequential compression device Start: 02/15/22 1026   Code Status:     Code Status: Full Code  Disposition: LTAC planned.  Status is: Inpatient  Remains inpatient appropriate because: Pending clinical improvement, significant hypernatremia,, IV antibiotics, IV fluids   Family Communication:  Communicated  with the patient's sister on the phone on 03/19/2022.  Consultants:  Infectious disease PCCM Neurosurgery's Interventional radiology   Procedures:  Intubation and mechanical ventilation Tracheostomy placement Cortrak tube placement decompressive laminectomies on 02/24/2022 and 03/01/2022.  Antimicrobials:  Rocephin IV  Anti-infectives (From admission, onward)    Start      Dose/Rate Route Frequency Ordered Stop   03/02/22 0927  ceFAZolin (ANCEF) IVPB 2g/100 mL premix        2 g 200 mL/hr over 30 Minutes Intravenous Every 8 hours 03/02/22 0927 03/02/22 1820   02/26/22 1300  vancomycin (VANCOCIN) IVPB 1000 mg/200 mL premix  Status:  Discontinued        1,000 mg 200 mL/hr over 60 Minutes Intravenous Every 24 hours 02/25/22 1458 03/01/22 0953   02/25/22 1145  vancomycin (VANCOREADY) IVPB 1500 mg/300 mL        1,500 mg 150 mL/hr over 120 Minutes Intravenous  Once 02/25/22 1051 02/25/22 1507   02/20/22 2200  cefTRIAXone (ROCEPHIN) 2 g in sodium chloride 0.9 % 100 mL IVPB        2 g 200 mL/hr over 30 Minutes Intravenous Every 12 hours 02/20/22 1122     02/19/22 2200  cefTRIAXone (ROCEPHIN) 2 g in sodium chloride 0.9 % 100 mL IVPB  Status:  Discontinued        2 g 200 mL/hr over 30 Minutes Intravenous Every 12 hours 02/19/22 1246 02/19/22 1320   02/19/22 2200  ceFEPIme (MAXIPIME) 2 g in sodium chloride 0.9 % 100 mL IVPB  Status:  Discontinued        2 g 200 mL/hr over 30 Minutes Intravenous Every 12 hours 02/19/22 1320 02/20/22 1122   02/17/22 1030  acyclovir (ZOVIRAX) 550 mg in dextrose 5 % 250 mL IVPB  Status:  Discontinued        550 mg 261 mL/hr over 60 Minutes Intravenous Every 12 hours 02/17/22 0855 02/20/22 0848   02/17/22 1000  ceFAZolin (ANCEF) IVPB 2g/100 mL premix  Status:  Discontinued        2 g 200 mL/hr over 30 Minutes Intravenous Every 12 hours 02/16/22 1322 02/17/22 0859   02/17/22 1000  vancomycin (VANCOREADY) IVPB 1250 mg/250 mL  Status:  Discontinued        1,250 mg 166.7 mL/hr over 90 Minutes Intravenous Every 24 hours 02/17/22 0836 02/20/22 0927   02/17/22 1000  ceFEPIme (MAXIPIME) 2 g in sodium chloride 0.9 % 100 mL IVPB  Status:  Discontinued        2 g 200 mL/hr over 30 Minutes Intravenous Every 12 hours 02/17/22 0857 02/19/22 1246   02/17/22 0945  ampicillin (OMNIPEN) 2 g in sodium chloride 0.9 % 100 mL IVPB  Status:  Discontinued         2 g 300 mL/hr over 20 Minutes Intravenous Every 6 hours 02/17/22 0859 02/20/22 1122   02/17/22 0915  ceFEPIme (MAXIPIME) 2 g in sodium chloride 0.9 % 100 mL IVPB  Status:  Discontinued        2 g 200 mL/hr over 30 Minutes Intravenous Every 8 hours 02/17/22 0826 02/17/22 0857   02/17/22 0915  ampicillin (OMNIPEN) 2 g in sodium chloride 0.9 % 100 mL IVPB  Status:  Discontinued        2 g 300 mL/hr over 20 Minutes Intravenous Every 4 hours 02/17/22 0826 02/17/22 0859   02/16/22 1230  cefTRIAXone (ROCEPHIN) 2 g in sodium chloride 0.9 % 100 mL IVPB  Status:  Discontinued  2 g 200 mL/hr over 30 Minutes Intravenous Every 24 hours 02/16/22 0609 02/16/22 1322   02/16/22 0815  vancomycin (VANCOREADY) IVPB 2000 mg/400 mL        2,000 mg 200 mL/hr over 120 Minutes Intravenous  Once 02/16/22 0721 02/16/22 1200   02/15/22 1230  cefTRIAXone (ROCEPHIN) 1 g in sodium chloride 0.9 % 100 mL IVPB  Status:  Discontinued        1 g 200 mL/hr over 30 Minutes Intravenous Every 24 hours 02/14/22 1306 02/16/22 0609   02/15/22 1200  Urelle (URELLE/URISED) 81 MG tablet 81 mg  Status:  Discontinued        1 tablet Oral 3 times daily 02/15/22 1051 02/16/22 1411   02/14/22 1230  cefTRIAXone (ROCEPHIN) 1 g in sodium chloride 0.9 % 100 mL IVPB        1 g 200 mL/hr over 30 Minutes Intravenous  Once 02/14/22 1225 02/14/22 1318      Subjective: Today, patient was seen and examined at bedside.  No dry mouth mild discomfort.  Has difficulty phonating.  Follows commands including squeezing hands  Objective: Vitals:   03/20/22 0358 03/20/22 0400 03/20/22 0700 03/20/22 0821  BP:   133/74 133/74  Pulse: (!) 115  (!) 104 (!) 102  Resp: '18  14 13  '$ Temp:  98.7 F (37.1 C) 98.6 F (37 C)   TempSrc:  Oral Axillary   SpO2: 98%  100% 100%  Weight:  122.5 kg    Height:        Intake/Output Summary (Last 24 hours) at 03/20/2022 1040 Last data filed at 03/20/2022 0657 Gross per 24 hour  Intake 6319.63 ml  Output  5590 ml  Net 729.63 ml   Filed Weights   03/16/22 0500 03/18/22 0500 03/20/22 0400  Weight: 94.9 kg 129.3 kg 122.5 kg    Physical Examination: Body mass index is 46.35 kg/m.   General: Obese built, not in obvious distress, mildly anxious, following commands HENT:   No scleral pallor or icterus noted. Oral mucosa is moist.  Cortrak tube tube in place, tracheostomy in place Chest:  .  Diminished breath sounds bilaterally.  Coarse breath sounds noted. CVS: S1 &S2 heard. No murmur.  Regular rate and rhythm. Abdomen: Soft, nontender, nondistended.  Bowel sounds are heard.   Extremities: No cyanosis, clubbing with bilateral trace edema.  Peripheral pulses are palpable. Psych: Alert, awake and communicative, difficult to phonate, follows commands including squeezing hands, CNS: Follows commands, alert awake, squeezes hands, unable to move bilateral lower extremity, moves upper extremities bilaterally. Skin: Warm and dry.  No rashes noted.   Data Reviewed:   CBC: Recent Labs  Lab 03/14/22 0358 03/15/22 0302 03/16/22 0352 03/17/22 0108 03/18/22 0259 03/19/22 0529 03/20/22 0321  WBC 11.9*   < > 12.3* 12.0* 10.1 9.9 10.3  NEUTROABS 7.7  --   --   --   --   --   --   HGB 9.5*   < > 9.5* 8.8* 9.2* 10.6* 9.5*  HCT 31.5*   < > 32.3* 30.2* 30.9* 35.0* 33.1*  MCV 95.7   < > 99.7 100.0 97.8 98.3 100.3*  PLT 236   < > 233 236 254 235 197   < > = values in this interval not displayed.    Basic Metabolic Panel: Recent Labs  Lab 03/16/22 0352 03/16/22 0914 03/17/22 0108 03/18/22 0259 03/19/22 0529 03/20/22 0321  NA 162* 162* 155* 151* 158* 153*  K 4.1  --  3.1* 4.5 3.9 4.0  CL 127*  --  122* 119* 122* 120*  CO2 27  --  '25 25 25 25  '$ GLUCOSE 107*  --  224* 137* 158* 189*  BUN 40*  --  44* 33* 32* 37*  CREATININE 1.05*  --  0.94 0.83 0.90 0.81  CALCIUM 8.6*  --  8.2* 8.7* 9.3 8.9  MG 2.9*  --  2.5* 2.5* 2.6* 2.5*    Liver Function Tests: Recent Labs  Lab 03/15/22 0302  03/16/22 0352 03/17/22 0108 03/19/22 0529  AST '23 21 23 26  '$ ALT '15 16 17 18  '$ ALKPHOS 117 105 95 98  BILITOT 0.6 0.4 0.3 0.5  PROT 7.6 7.3 6.7 7.7  ALBUMIN 2.4* 2.4* 2.3* 2.7*     Radiology Studies: No results found.    LOS: 34 days    Flora Lipps, MD [facility] Available via Epic secure chat 7am-7pm After these hours, please refer to coverage provider listed on [email or web address] 03/20/2022, 10:40 AM

## 2022-03-20 NOTE — Consult Note (Signed)
[place] Nurse wound follow up Wound type: Resolving DTPI left posterior heel/achilles area: 0.3cm x 0.4cm x 0cm Resolving mucosal pressure injury: proximal edge of anus New area discovered: Unstageable pressure injury; right occiput; 3cm x 2cm x 0cm  Measurement:see above Wound bed: Left heel; dark purple resolving, intact Right occiput: 100% eschar  Drainage (amount, consistency, odor) none Periwound: intact  Dressing procedure/placement/frequency: Continue offloading boots; using hard PROFO boots when I was in. She does have the need for foot drop prevention and she rotates the left foot laterally.  Prevalon boots can be used as well and have less potential to cause device related PI. Will note that in the orders.  Flexiseal has been discontinued this should help with resolution of the anal MDRPI.  New eshcar right occiput. Suggested monitoring and turning head off of area now that the patient is more alert. Cautioned against use of donut cushions or other positioners, may be able to use air plane pillow to readjust head.   [place] Nurse team will follow with you and see patient within 10 days for wound assessments.  Please notify [place] nurses of any acute changes in the wounds or any new areas of concern [place] MSN, [place], [place], [place]

## 2022-03-20 NOTE — Progress Notes (Signed)
Speech Language Pathology Treatment: Dysphagia;Passy Muir Speaking valve  Patient Details Name: Gabrielle Vincent MRN: [id number] DOB: 01-11-1966 Today's Date: 03/20/2022 Time: 6767-2094 SLP Time Calculation (min) (ACUTE ONLY): 25 min  Assessment / Plan / Recommendation Clinical Impression  Pt was seen for f/u after FEES on previous date. RN says she placed PMV and fed pt a full container of pudding and a full container of nectar thick juice this morning without overt difficulty or changes in respirations. SLP placed PMV with improved voicing observed, although often resorting to mouthing words. SLP provided Mod cues throughout the session to talk "loud" to increase vocal intensity and subsequently intelligibility. While some of this may be cognitive in nature, also suspect that adding a little bit more moisture to her pharynx may already be helpful for communication and swallowing. Pt did have some coughing during PO trials with SLP, although on FEES, it was not clearly evident that this was suggestive of aspiration. Pt may however benefit from MBS as well to ensure there is not small amounts of aspiration occurring at the height of the swallowing before advancing to full diet trays. Messaged MD about timing of this considering that pt is otherwise scheduled to have PEG placed later this week. Would continue with snacks or purees and nectar thick liquids and intermittent placement of PMV when supervision can be provided.    HPI HPI: Gabrielle Vincent presents 02/14/2022 with severe back pain associated with hematuria and dysuria. Found to have UTI and  AKI. MRI demonstrated small herniated nucleus pulposus at L3-L4. 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 Cultures positive for meningitis. MRI spine demonstrates diffuse epidural abscess with cord compression and likely signal change. [name]. MRI 5/16 extensive thoracic epidural abscess. 5/18- Pt underwent thoracolumbar laminectomy and decompression of epidural abscess. Extubated 5/19. Swallow evaluated 5/21 with recs for NPO; high aspiration risk. Reintubated 5/25; 5/25 repeat MRI with ongoing meningitis + new area of ischemia in central pons. Trach 5/26.  PMH includes obesity, HLD, diverticulosis.      SLP Plan  MBS      Recommendations for follow up therapy are one component of a multi-disciplinary discharge planning process, led by the attending physician.  Recommendations may be updated based on patient status, additional functional criteria and insurance authorization.    Recommendations  Diet recommendations:  (snacks of purees and nectar thick liquids from floor stock) Liquids provided via: Teaspoon;Straw Medication Administration: Via alternative means Supervision: Staff to assist with self feeding;Full supervision/cueing for compensatory strategies Compensations: Slow rate;Small sips/bites;Effortful swallow Postural Changes and/or Swallow Maneuvers: Seated upright 90 degrees      Patient may use Passy-Muir Speech Valve: Intermittently with supervision;During all therapies with supervision;During PO intake/meals PMSV Supervision: Full MD: Please consider changing trach tube to : Cuffless;Smaller size         Oral Care Recommendations: Oral care QID Follow Up Recommendations: Skilled nursing-short term rehab (<3 hours/day) Assistance recommended at discharge: Frequent or constant Supervision/Assistance SLP Visit Diagnosis: Dysphagia, unspecified (R13.10) Plan: MBS           Gabrielle Bond., M.A. [place] Office [phone]  Secure chat preferred   03/20/2022, 11:55 AM

## 2022-03-21 ENCOUNTER — Other Ambulatory Visit (HOSPITAL_COMMUNITY): Payer: Self-pay

## 2022-03-21 ENCOUNTER — Inpatient Hospital Stay: Admit: 2022-03-21 | Discharge: 2022-04-20 | Payer: BC Managed Care – PPO | Source: Other Acute Inpatient Hospital

## 2022-03-21 ENCOUNTER — Other Ambulatory Visit (HOSPITAL_BASED_OUTPATIENT_CLINIC_OR_DEPARTMENT_OTHER): Payer: Self-pay

## 2022-03-21 ENCOUNTER — Inpatient Hospital Stay (HOSPITAL_COMMUNITY): Payer: BC Managed Care – PPO

## 2022-03-21 DIAGNOSIS — A419 Sepsis, unspecified organism: Secondary | ICD-10-CM

## 2022-03-21 DIAGNOSIS — J9621 Acute and chronic respiratory failure with hypoxia: Secondary | ICD-10-CM

## 2022-03-21 DIAGNOSIS — I6312 Cerebral infarction due to embolism of basilar artery: Secondary | ICD-10-CM

## 2022-03-21 DIAGNOSIS — R6520 Severe sepsis without septic shock: Secondary | ICD-10-CM

## 2022-03-21 DIAGNOSIS — G061 Intraspinal abscess and granuloma: Secondary | ICD-10-CM | POA: Diagnosis not present

## 2022-03-21 DIAGNOSIS — G9349 Other encephalopathy: Secondary | ICD-10-CM

## 2022-03-21 DIAGNOSIS — G009 Bacterial meningitis, unspecified: Secondary | ICD-10-CM

## 2022-03-21 LAB — GLUCOSE, CAPILLARY
Glucose-Capillary: 153 mg/dL — ABNORMAL HIGH (ref 70–99)
Glucose-Capillary: 83 mg/dL (ref 70–99)
Glucose-Capillary: 132 mg/dL — ABNORMAL HIGH (ref 70–99)

## 2022-03-21 LAB — CBC
HCT: 31.3 % — ABNORMAL LOW (ref 36.0–46.0)
Hemoglobin: 9.1 g/dL — ABNORMAL LOW (ref 12.0–15.0)
MCH: 28.9 pg (ref 26.0–34.0)
MCHC: 29.1 g/dL — ABNORMAL LOW (ref 30.0–36.0)
MCV: 99.4 fL (ref 80.0–100.0)
Platelets: 206 10*3/uL (ref 150–400)
RBC: 3.15 MIL/uL — ABNORMAL LOW (ref 3.87–5.11)
RDW: 19.5 % — ABNORMAL HIGH (ref 11.5–15.5)
WBC: 11.2 10*3/uL — ABNORMAL HIGH (ref 4.0–10.5)
nRBC: 0.4 % — ABNORMAL HIGH (ref 0.0–0.2)

## 2022-03-21 LAB — SODIUM
Sodium: 145 mmol/L (ref 135–145)
Sodium: 142 mmol/L (ref 135–145)
Sodium: 141 mmol/L (ref 135–145)
Sodium: 139 mmol/L (ref 135–145)
Sodium: 140 mmol/L (ref 135–145)

## 2022-03-21 LAB — BASIC METABOLIC PANEL
Anion gap: 13 (ref 5–15)
BUN: 25 mg/dL — ABNORMAL HIGH (ref 6–20)
CO2: 24 mmol/L (ref 22–32)
Calcium: 9 mg/dL (ref 8.9–10.3)
Chloride: 107 mmol/L (ref 98–111)
Creatinine, Ser: 0.76 mg/dL (ref 0.44–1.00)
GFR, Estimated: >60 mL/min (ref >60)
Glucose, Bld: 152 mg/dL — ABNORMAL HIGH (ref 70–99)
Potassium: 3.5 mmol/L (ref 3.5–5.1)
Sodium: 144 mmol/L (ref 135–145)

## 2022-03-21 LAB — MAGNESIUM: Magnesium: 2.3 mg/dL (ref 1.7–2.4)

## 2022-03-21 IMAGING — DX DG CHEST 1V PORT
1 series · 1 of 1 positions shown · non-contrast
Comparison: Chest x-ray dated [DATE].

CLINICAL DATA: Respiratory failure.

EXAM:
PORTABLE CHEST 1 VIEW

[chest]
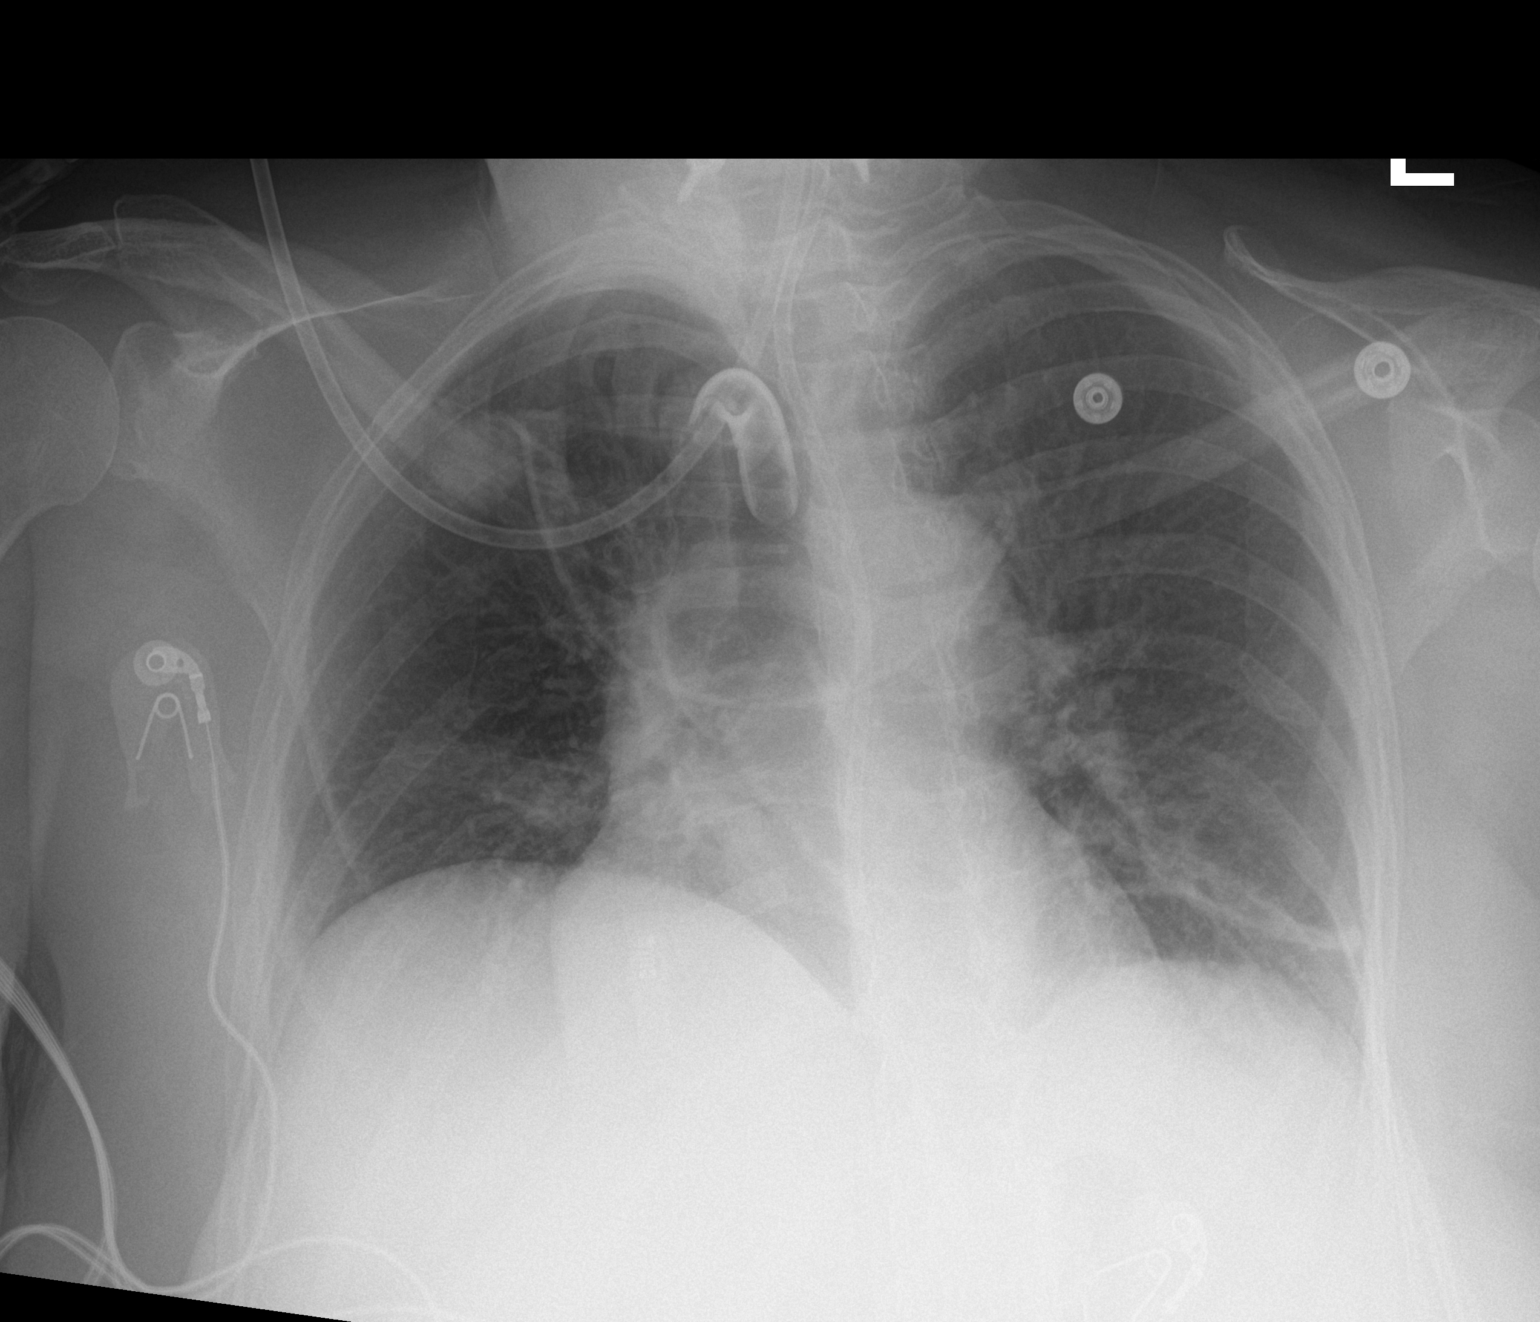

[1 of 1 positions shown; findings below may reference images not displayed]

FINDINGS: Interval removal of the right-sided chest tube. No pneumothorax.
Unchanged tracheostomy and feeding tubes. New mild left basilar
atelectasis. No focal consolidation, pleural effusion, or
pneumothorax. Unchanged 2.1 cm nodule in the right upper lobe. No
acute osseous abnormality.
IMPRESSION: 1. Interval removal of the right-sided chest tube. No pneumothorax.
2. Mild left basilar atelectasis.
3. Unchanged right upper lobe nodule.

## 2022-03-21 MED ORDER — JUVEN PO PACK: 1.0000 | PACK | Freq: Two times a day (BID) | ORAL | 0 refills | Status: AC

## 2022-03-21 MED ORDER — INSULIN LISPRO 100 UNIT/ML IJ SOLN
8.0000 [IU] | INTRAMUSCULAR | 11 refills | Status: AC
  Filled 2022-03-21 (×2): qty 10, 21d supply, fill #0

## 2022-03-21 MED ORDER — GERHARDT'S BUTT CREAM: 1.0000 "application " | TOPICAL_CREAM | Freq: Two times a day (BID) | CUTANEOUS | Status: AC

## 2022-03-21 MED ORDER — INSULIN GLARGINE-YFGN 100 UNIT/ML ~~LOC~~ SOLN
25.0000 [IU] | Freq: Two times a day (BID) | SUBCUTANEOUS | 11 refills | Status: AC
  Filled 2022-03-21 (×2): qty 10, 20d supply, fill #0

## 2022-03-21 MED ORDER — DESMOPRESSIN ACETATE 4 MCG/ML IJ SOLN
0.5000 ug | Freq: Two times a day (BID) | INTRAMUSCULAR | Status: DC
  Administered 2022-03-21: 0.52 ug via INTRAVENOUS
  Filled 2022-03-21: qty 1

## 2022-03-21 MED ORDER — JEVITY 1.5 CAL/FIBER PO LIQD: 237.0000 mL | Freq: Three times a day (TID) | ORAL | Status: AC

## 2022-03-21 MED ORDER — OXYCODONE HCL 5 MG PO TABS: 5.0000 mg | ORAL_TABLET | Freq: Four times a day (QID) | ORAL | 0 refills | Status: AC | PRN

## 2022-03-21 MED ORDER — JEVITY 1.5 CAL/FIBER PO LIQD: 474.0000 mL | ORAL | Status: AC

## 2022-03-21 MED ORDER — POLYETHYLENE GLYCOL 3350 17 G PO PACK
17.0000 g | PACK | Freq: Every day | ORAL | 0 refills | Status: AC | PRN
  Filled 2022-03-21: qty 14, 14d supply, fill #0

## 2022-03-21 MED ORDER — DOCUSATE SODIUM 50 MG/5ML PO LIQD
100.0000 mg | Freq: Two times a day (BID) | ORAL | 0 refills | Status: AC | PRN
  Filled 2022-03-21: qty 100, 5d supply, fill #0

## 2022-03-21 MED ORDER — CEFTRIAXONE IV (FOR PTA / DISCHARGE USE ONLY): 2.0000 g | Freq: Two times a day (BID) | INTRAVENOUS | 0 refills | Status: AC

## 2022-03-21 MED ORDER — FREE WATER
200.0000 mL | Status: DC
  Administered 2022-03-21: 200 mL

## 2022-03-21 MED ORDER — DESMOPRESSIN ACETATE 4 MCG/ML IJ SOLN: 0.5000 ug | Freq: Two times a day (BID) | INTRAMUSCULAR | Status: AC

## 2022-03-21 MED ORDER — ACETAMINOPHEN 160 MG/5ML PO SOLN
650.0000 mg | ORAL | 0 refills | Status: AC | PRN
  Filled 2022-03-21: qty 120, 1d supply, fill #0

## 2022-03-21 MED ORDER — FENTANYL CITRATE PF 50 MCG/ML IJ SOSY
50.0000 ug | PREFILLED_SYRINGE | INTRAMUSCULAR | 0 refills | Status: AC | PRN
Start: 2022-03-21 — End: ?

## 2022-03-21 MED ORDER — PROSOURCE TF PO LIQD: 90.0000 mL | Freq: Two times a day (BID) | ORAL | Status: AC

## 2022-03-21 MED ORDER — SENNA 8.6 MG PO TABS
1.0000 | ORAL_TABLET | Freq: Every day | ORAL | 0 refills | Status: AC | PRN
  Filled 2022-03-21: qty 120, 120d supply, fill #0

## 2022-03-21 MED ORDER — MIDODRINE HCL 5 MG PO TABS: 5.0000 mg | ORAL_TABLET | Freq: Three times a day (TID) | ORAL | Status: AC

## 2022-03-21 MED ORDER — ZINC OXIDE 12.8 % EX OINT
TOPICAL_OINTMENT | CUTANEOUS | 0 refills | Status: AC | PRN
  Filled 2022-03-21: qty 56.7, fill #0

## 2022-03-21 MED ORDER — FREE WATER: 200.0000 mL | Status: AC

## 2022-03-21 NOTE — Progress Notes (Signed)
PHARMACY CONSULT NOTE FOR:  OUTPATIENT  PARENTERAL ANTIBIOTIC THERAPY (OPAT)  Indication: Disseminated E.coli Infection Regimen: Rocephin 2g IV every 12 hours End date: 05/03/22  IV antibiotic discharge orders are pended. To discharging provider:  please sign these orders via discharge navigator,  Select New Orders & click on the button choice - Manage This Unsigned Work.     Thank you for allowing pharmacy to be a part of this patient's care.  Alycia Rossetti, PharmD, BCPS Infectious Diseases Clinical Pharmacist 03/21/2022 11:58 AM   **Pharmacist phone directory can now be found on [email or web address] ([email or web address]).  Listed under [place].

## 2022-03-21 NOTE — Progress Notes (Signed)
Modified Barium Swallow Progress Note  Patient Details  Name: Gabrielle Vincent MRN: [id number] Date of Birth: 12/19/1965  Today's Date: 03/21/2022  Modified Barium Swallow completed.  Full report located under Chart Review in the Imaging Section.  Brief recommendations include the following:  Clinical Impression  Pt demonstrates overall adequate swallowing function. Airway protection is consistent throughout exam even while short of breath with consecutive sips of thin via straw. PMSV in place during exam; pt unable to breathe through her nose while masticating and needed rest breaks for oral breathing with bolus still in oral cavity. Cortrak also present and slightly impeding full epiglottic deflection and PES opening, resulting in mild vallecular residue with solids and pill hesitating at [facility] Puree needed to fully clear tablet. Pt is capable of regular or soft diet and thin liquids if endurance allows it. Will initiate a ground diet at first for energy conservation and advance if tolerated. She will probably be more comfortable swallowing once cortrak is out, though adequate oral intake needs to first be established.   Swallow Evaluation Recommendations       SLP Diet Recommendations: Dysphagia 2 (Fine chop) solids;Thin liquid   Liquid Administration via: Cup;Straw   Medication Administration: Whole meds with puree   Supervision: Staff to assist with self feeding   Compensations: Slow rate;Small sips/bites;Follow solids with liquid   Postural Changes: Seated upright at 90 degrees   Oral Care Recommendations: Oral care BID   Other Recommendations: Place PMSV during PO intake    Jaimon Bugaj, Katherene Ponto 03/21/2022,10:43 AM

## 2022-03-21 NOTE — Progress Notes (Signed)
Patient ID: Gabrielle Vincent, female   DOB: July 15, 1966, 56 y.o.   MRN: [id number] Note that patient is to be discharged today. Have recently reviewed her studies and believe that from a surgical standpoint her spine should continue to heal albeit healing has been slowly occurring since her surgical decompressions.  Continues to show clinical improvement in her upper extremity function with increased movement of the upper extremities no movement has been noted in the lower extremities though there is some sensation that she reports.  A 12-monthoutpatient follow-up visit with neurosurgery would be recommended.

## 2022-03-21 NOTE — Progress Notes (Signed)
Patient ID: Rayanna Matusik, female   DOB: 07/09/1966, 56 y.o.   MRN: [id number] S: Was complaining of pain in legs and back this morning. O:BP 129/69 (BP Location: Right Arm)   Pulse (!) 109   Temp 98.6 F (37 C) (Oral)   Resp 16   Ht _0  (1.626 m)   Wt 122.5 kg   LMP  (LMP Unknown)   SpO2 94%   BMI 46.35 kg/m   Intake/Output Summary (Last 24 hours) at 03/21/2022 1043 Last data filed at 03/21/2022 0631 Gross per 24 hour  Intake 3310.48 ml  Output 3350 ml  Net -39.52 ml   Intake/Output: I/O last 3 completed shifts: In: 6238.4 [P.O.:700; I.V.:2174.3; OE/UM:3536; IV Piggyback:100.1] Out: 7300 [Urine:7300]  Intake/Output this shift:  No intake/output data recorded. Weight change:  Gen: chronically ill-appearing female in NAD RWE:RXVQM Resp: scattered rhonchi Abd:+BS, soft, NT/ND Ext: no edema  Recent Labs  Lab 03/15/22 0302 03/16/22 0352 03/16/22 0914 03/17/22 0108 03/18/22 0259 03/19/22 0529 03/20/22 0321 03/20/22 1651 03/20/22 1921 03/20/22 2115 03/20/22 2312 03/21/22 0107 03/21/22 0328 03/21/22 0643 03/21/22 0810  NA 157* 162*   < > 155* 151* 158* 153*   < > 152* 151* 149* 145 144 142 141  K 4.0 4.1  --  3.1* 4.5 3.9 4.0  --   --   --   --   --  3.5  --   --   CL 124* 127*  --  122* 119* 122* 120*  --   --   --   --   --  107  --   --   CO2 24 27  --  _1 --   --   --   --   --  24  --   --   GLUCOSE 130* 107*  --  224* 137* 158* 189*  --   --   --   --   --  152*  --   --   BUN 38* 40*  --  44* 33* 32* 37*  --   --   --   --   --  25*  --   --   CREATININE 1.05* 1.05*  --  0.94 0.83 0.90 0.81  --   --   --   --   --  0.76  --   --   ALBUMIN 2.4* 2.4*  --  2.3*  --  2.7*  --   --   --   --   --   --   --   --   --   CALCIUM 8.8* 8.6*  --  8.2* 8.7* 9.3 8.9  --   --   --   --   --  9.0  --   --   AST 23 21  --  23  --  26  --   --   --   --   --   --   --   --   --   ALT 15 16  --  17  --  18  --   --   --   --   --   --   --   --   --    < > = values  in this interval not displayed.   Liver Function Tests: Recent Labs  Lab 03/16/22 0352 03/17/22 0108 03/19/22 0529  AST _2 ALT _3 ALKPHOS 105  95 98  BILITOT 0.4 0.3 0.5  PROT 7.3 6.7 7.7  ALBUMIN 2.4* 2.3* 2.7*   No results for input(s): LIPASE, AMYLASE in the last 168 hours. No results for input(s): AMMONIA in the last 168 hours. CBC: Recent Labs  Lab 03/17/22 0108 03/18/22 0259 03/19/22 0529 03/20/22 0321 03/21/22 0328  WBC 12.0* 10.1 9.9 10.3 11.2*  HGB 8.8* 9.2* 10.6* 9.5* 9.1*  HCT 30.2* 30.9* 35.0* 33.1* 31.3*  MCV 100.0 97.8 98.3 100.3* 99.4  PLT 236 254 235 197 206   Cardiac Enzymes: No results for input(s): CKTOTAL, CKMB, CKMBINDEX, TROPONINI in the last 168 hours. CBG: Recent Labs  Lab 03/20/22 1738 03/20/22 2003 03/20/22 2330 03/21/22 0312 03/21/22 0819  GLUCAP 140* 153* 225* 153* 83    Iron Studies: No results for input(s): IRON, TIBC, TRANSFERRIN, FERRITIN in the last 72 hours. Studies/Results: No results found.  chlorhexidine gluconate (MEDLINE KIT)  15 mL Mouth Rinse BID   Chlorhexidine Gluconate Cloth  6 each Topical Daily   desmopressin  0.52 mcg Intravenous BID   feeding supplement (JEVITY 1.5 CAL/FIBER)  237 mL Per Tube TID WC   feeding supplement (JEVITY 1.5 CAL/FIBER)  474 mL Per Tube Q24H   feeding supplement (PROSource TF)  90 mL Per Tube BID   free water  200 mL Per Tube Q4H   Gerhardt's butt cream   Topical BID   heparin injection (subcutaneous)  5,000 Units Subcutaneous Q8H   insulin aspart  0-15 Units Subcutaneous Q4H   insulin aspart  8 Units Subcutaneous Q4H   insulin glargine-yfgn  25 Units Subcutaneous BID   mouth rinse  15 mL Mouth Rinse q12n4p   midodrine  5 mg Per Tube Q8H   nutrition supplement (JUVEN)  1 packet Per Tube BID BM   pantoprazole sodium  40 mg Per Tube Q1400    BMET    Component Value Date/Time   NA 141 03/21/2022 0810   K 3.5 03/21/2022 0328   CL 107 03/21/2022 0328   CO2 24  03/21/2022 0328   GLUCOSE 152 (H) 03/21/2022 0328   BUN 25 (H) 03/21/2022 0328   CREATININE 0.76 03/21/2022 0328   CALCIUM 9.0 03/21/2022 0328   GFRNONAA >60 03/21/2022 0328   CBC    Component Value Date/Time   WBC 11.2 (H) 03/21/2022 0328   RBC 3.15 (L) 03/21/2022 0328   HGB 9.1 (L) 03/21/2022 0328   HCT 31.3 (L) 03/21/2022 0328   PLT 206 03/21/2022 0328   MCV 99.4 03/21/2022 0328   MCH 28.9 03/21/2022 0328   MCHC 29.1 (L) 03/21/2022 0328   RDW 19.5 (H) 03/21/2022 0328   LYMPHSABS 2.3 03/14/2022 0358   MONOABS 1.1 (H) 03/14/2022 0358   EOSABS 0.1 03/14/2022 0358   BASOSABS 0.1 03/14/2022 0358      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  Restarted DDAVP at 23mg IV BID but not given until 10 pm last night.  Sodium levels immediately decreased and D5 was stopped last night.   Decrease dose to 0.5 mcg IV bid Decrease FWF to every 4 hours instead of every 2 Will follow sodium and adjust DDAVP dose based on response as well as FWF. She will likely need it long-term for now given that she only had 1 normal sodium level since 03/01/22.   Uosm was 218 prior to DDAVF.  Will recheck today and follow response to DDAVP. 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  ID following. Incomplete quadriplegia VDRF  - s/p trach, per PCCM AKI resolved Rectal wound - wound care following. Urinary retention - failed voiding trial and now with foley catheter  Donetta Potts, MD [facility]

## 2022-03-21 NOTE — Discharge Summary (Addendum)
Physician Discharge Summary  Braylin Formby ATF:[id number] DOB: 10-13-1966 DOA: 02/14/2022  PCP: Pcp, No  Admit date: 02/14/2022 Discharge date: 03/21/2022  Time spent: 75 minutes  Recommendations for Outpatient Follow-up:  Will need careful adjustment of free water flushes as well as DDAVP which is currently at 0.5 twice daily under the guidance of nephrology to help with the hypernatremia and central diabetes insipidus Recommend continue core track feeds until needs nutritional goal and would recommend calorie count continuously Will require pulmonology oversight for tracheotomy and de-escalation off of this as the patient continues to improve in addition to continued speech therapy input for PMV trials and swallow eval's--May require PEG tube placement if does not thrive however may be able to be liberated from tube feeds  if there is ongoing improvement Consider aspirin 81 mg resumption if no PEG tube is placed in 48 to 72-hour Recommend daily chemistries, continue other daily CBC Will need follow-up for right upper lobe 2.2 cm spiculated mass found on CT once the patient stabilizes  Discharge Diagnoses:  MAIN problem for hospitalization   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 Status post C3-7 cervical decompression and thoracolumbar laminectomy and decompression of the epidural abscess on 5/18  Please see below for itemized issues addressed in [facility]- refer to other progress notes for clarity if needed  Discharge Condition: Fair  Diet recommendation: Dysphagia 2 diet  Filed Weights   03/16/22 0500 03/18/22 0500 03/20/22 0400  Weight: 94.9 kg 129.3 kg 122.5 kg    History of present illness:  56 year old white female admitted 5/3 with severe lower back pain with left leg radiation hematuria dysuria Work-up revealed obstructive uropathy fatty infiltrates no cholecystitis Patient found to have new AKI creatinine 3.6 transaminitis and hyponatremia  127 5/4 work-up showed acute herniated L3-L4 disc with left lumbar radiculopathy --- neurosurgery was consulted and initially recommended conservative management however she became intermittently agitated and lethargic further imaging =--hypodense area right basal ganglia Blood cultures gram-negative bacteremia 5/5-progressive encephalopathy hypernatremia hyperchloremia moved to ICU due to delirium and intubated--- seen by neurology infectious disease midline placed 5/6 meningitis coverage 5/8 LP showing purulent CSF-found to have bacterial meningitis gram-positive cocci 5/10?  Central diabetes insipidus based on significant rise in sodium with massive urine output greater than 7 L 5/13 to OR for posterior cervical decompressive laminectomy and medial facetectomy C3-T1 for evacuation of epirudal abscess, posterior cervical arthrodesis C3-C7, segmental fixation C3-7 5/18 OR for thoracolumbar laminectomy and decompression of epidural abscess from T-L spine 5/19 extubated to [facility] 5/24 reintubated 5/25 chest tube. MRI with ongoing meningitis + new area of ischemia in central pons 5/26 Tracheostomy created. 5/30 Tolerating TCT x 48H. Ongoing swallow dysfunction, SLP following.  6/2 repeat MRI back showing persisting epidural abscess extending from lumbar to cervical spine   Hospital Course:   Acute metabolic encephalopathy secondary to sepsis infection and meningitis-now resolved  Septic shock on admission from  E. coli complicated UTI with extensive epidural spinal abscess and meningitis status post laminectomies as dictated above Repeat MRIs show persisting abscess-repeat blood cultures 5/31 negative for 5 days Not a repeat neurosurgical candidate at this time White count has been stable 11.2 range OPAT has been ordered ceftriaxone twice daily until 7/19 and I will CC all specialists were involved in patient's care I will CC Dr. 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 Aspirin on hold while debating PEG tube placement-if improves in 3 to 5 days and no PEG is needed would resume aspirin If not hold and then place PEG tube Central diabetes insipidus Nephrology was consulted on 6/6 They recommended decreasing free water and giving DDAVP-we will continue DDAVP 0.5 twice daily and this can be adjusted downward titration of free water flushes which will continue at current rate Acute urinary retention-failed voiding trial we will continue Foley--- trial voiding trial within the next 5 to 7 days Pressure injury from large fecal collection system Bilateral foot drop Continue turning etc. Will need AFOs at all times  Consultations: Neurosurgery Dr. Ellene Route Infectious disease Neurology  Discharge Exam: Vitals:   03/21/22 0818 03/21/22 0922  BP: 129/69   Pulse: 99 (!) 109  Resp: 20 16  Temp: 98.6 F (37 C)   SpO2: 97% 94%    Subj on day of d/c   Week speech however awakens Core track in place in left nare No icterus no pallor tracks well Moving upper extremities reasonably well Lower extremities remain somewhat plegic She has AFOs on I examined her back she has some Steri-Strips on her neck and a larger bandaged area on her lower back We did not examine her bottom   Discharge Instructions     Diet - low sodium heart healthy   Complete by: As directed    Discharge wound care:   Complete by: As directed    As above    Incentive spirometry RT   Complete by: As directed    Increase activity slowly   Complete by: As directed       Allergies as of 03/21/2022       Reactions   Erythromycin Anaphylaxis   Penicillins Nausea Only   Prednisone Other (See Comments)   Lymph node swelling        Medication List     STOP taking these medications    ibuprofen 200 MG tablet Commonly known as: ADVIL       TAKE these medications    acetaminophen 160 MG/5ML solution Commonly known as: TYLENOL Place 20.3 mLs (650 mg total) into feeding tube every 4 (four) hours as needed for mild pain ((score 1 to 3) or temp > 100.5).   cefTRIAXone  IVPB Commonly known as: ROCEPHIN Inject 2 g into the vein every 12 (twelve) hours. Indication:  Disseminated E.coli Infection First Dose: Yes Last Day of Therapy:  05/03/22 Labs - Once weekly:  CBC/D and BMP, Labs - Every other week:  ESR and CRP Method of administration: IV Push Method of administration may be changed at the discretion of home infusion pharmacist based upon assessment of the patient and/or caregiver's ability to self-administer the medication ordered.   desmopressin 4 MCG/ML injection Commonly known as: DDAVP Inject 0.13 mLs (0.52 mcg total) into the vein 2 (two) times daily.   docusate 50 MG/5ML liquid Commonly known as: COLACE Place 10 mLs (100 mg total) into feeding tube 2 (two) times daily as needed for mild constipation.   feeding supplement (PROSource TF) liquid Place 90 mLs  into feeding tube 2 (two) times daily.   nutrition supplement (JUVEN) Pack Place 1 packet into feeding tube 2 (two) times daily between meals.   feeding supplement (JEVITY 1.5 CAL/FIBER) Liqd Place 237 mLs into feeding tube 3 (three) times daily with meals.   feeding supplement (JEVITY 1.5 CAL/FIBER) Liqd Place 474 mLs into feeding tube daily.   fentaNYL 50 MCG/ML injection Commonly known as: SUBLIMAZE Inject 1 mL (50 mcg total) into the vein every 3 (three)  hours as needed for severe pain.   free water Soln Place 200 mLs into feeding tube every 4 (four) hours.   Gerhardt's butt cream Crea Apply 1 application. topically 2 (two) times daily.   insulin glargine-yfgn 100 UNIT/ML injection Commonly known as: SEMGLEE Inject 0.25 mLs (25 Units total) into the skin 2 (two) times daily.   insulin lispro 100 UNIT/ML injection Commonly known as: HUMALOG Inject 0.08 mLs (8 Units total) into the skin every 4 (four) hours.   midodrine 5 MG tablet Commonly known as: PROAMATINE Place 1 tablet (5 mg total) into feeding tube every 8 (eight) hours.   oxyCODONE 5 MG immediate release tablet Commonly known as: Oxy IR/ROXICODONE Place 1 tablet (5 mg total) into feeding tube every 6 (six) hours as needed for moderate pain.   polyethylene glycol 17 g packet Commonly known as: MIRALAX / GLYCOLAX Place 17 g into feeding tube daily as needed for moderate constipation.   senna 8.6 MG Tabs tablet Commonly known as: SENOKOT Place 1 tablet (8.6 mg total) into feeding tube daily as needed for mild constipation.   Zinc Oxide 12.8 % ointment Commonly known as: TRIPLE PASTE Apply topically as needed for irritation.               Discharge Care Instructions  (From admission, onward)           Start     Ordered   03/21/22 0000  Discharge wound care:       Comments: As above   03/21/22 1117   03/21/22 0000  Change dressing on IV access line weekly and PRN  (Home infusion instructions - [facility] )        03/21/22 1208           Allergies  Allergen Reactions   Erythromycin Anaphylaxis   Penicillins Nausea Only   Prednisone Other (See Comments)    Lymph node swelling      The results of significant diagnostics from this hospitalization (including imaging, microbiology, ancillary and laboratory) are listed below for reference.    Significant Diagnostic Studies: CT ABDOMEN WO CONTRAST  Result Date: 03/14/2022 CLINICAL  DATA:  Encephalopathy, meningitis and respiratory failure. Need for percutaneous gastrostomy tube for nutrition. Evaluation anatomy prior to potential attempted percutaneous gastrostomy tube placement. EXAM: CT ABDOMEN WITHOUT CONTRAST TECHNIQUE: Multidetector CT imaging of the abdomen was performed following the standard protocol without IV contrast. RADIATION DOSE REDUCTION: This exam was performed according to the departmental dose-optimization program which includes automated exposure control, adjustment of the mA and/or kV according to patient size and/or use of iterative reconstruction technique. COMPARISON:  Prior CT of the abdomen and pelvis on 02/14/2022 FINDINGS: Lower chest: Minimal bibasilar atelectasis. Hepatobiliary: Gallstones again visible in the gallbladder lumen. No biliary ductal dilatation. No hepatic lesions identified by unenhanced CT. Pancreas: Unremarkable. No pancreatic ductal dilatation or surrounding inflammatory changes. Spleen: Normal in size without focal abnormality. Adrenals/Urinary Tract: Adrenal glands are unremarkable. Kidneys are normal, without renal calculi, focal lesion,  or hydronephrosis. Stomach/Bowel: No hiatal hernia. A feeding tube extends to the region of the distal gastric antrum. The stomach is normally positioned. No interposition of the colon between the stomach and abdominal wall. The left lobe of the liver is interposed between the proximal stomach and the abdominal wall. The body and distal stomach lie immediately deep to the abdominal wall. No evidence of bowel obstruction, ileus or free intraperitoneal air. Vascular/Lymphatic: Atherosclerosis of the abdominal aorta without evidence of aneurysm. No enlarged lymph nodes identified. Other: No abdominal wall hernia. No ascites or abnormal fluid collections. Musculoskeletal: No acute or significant osseous findings. IMPRESSION: 1. Normal gastric positioning. No anatomic contraindication to attempted percutaneous  gastrostomy tube placement. 2. Current feeding tube extends to the region of the distal gastric antrum. 3. Atherosclerosis of the abdominal aorta without aneurysm. 4. Cholelithiasis. Electronically Signed   By: Aletta Edouard M.D.   On: 03/14/2022 14:32   DG Cervical Spine 2 or 3 views  Result Date: 02/24/2022 CLINICAL DATA:  Posterior cervical laminectomy EXAM: CERVICAL SPINE - 2-3 VIEW COMPARISON:  None Available. FLUOROSCOPY: Air kerma 2.55 mGy FINDINGS: Intraoperative lateral fluoroscopic images of the cervical spine demonstrate posterior laminectomy of C3 through C7 with pedicle screws in position and overlying retractors. No obvious perihardware fracture or component malpositioning. IMPRESSION: Intraoperative lateral fluoroscopic images demonstrate posterior laminectomy of C3 through C7 with pedicle screws in position and overlying retractors. Electronically Signed   By: Delanna Ahmadi M.D.   On: 02/24/2022 13:44   DG Thoracic Spine 2 View  Result Date: 03/01/2022 CLINICAL DATA:  OR. EXAM: THORACIC SPINE 2 VIEWS COMPARISON:  Thoracic spine MRI 02/27/2022 FINDINGS: Single cross-table lateral view of the thoracolumbar spine. Linear instrument is seen overlying the posterior elements at the T11 level. No fracture or malalignment. IMPRESSION: As above. Electronically Signed   By: Ronney Asters M.D.   On: 03/01/2022 20:52   DG Abd 1 View  Result Date: 02/19/2022 CLINICAL DATA:  Orogastric tube placement EXAM: ABDOMEN - 1 VIEW COMPARISON:  02/18/2022 FINDINGS: Limited radiograph of the lower chest and upper abdomen was obtained for the purposes of enteric tube localization. Enteric tube is seen coursing below the diaphragm with distal tip and side port terminating within the expected location of the gastric body. IMPRESSION: Enteric tube tip and side port positioned within the gastric body. Electronically Signed   By: Davina Poke D.O.   On: 02/19/2022 14:32   CT HEAD WO CONTRAST (5MM)  Result  Date: 02/21/2022 CLINICAL DATA:  Hydrocephalus, shunted, follow-up. Rule out hydrocephalus. EXAM: CT HEAD WITHOUT CONTRAST TECHNIQUE: Contiguous axial images were obtained from the base of the skull through the vertex without intravenous contrast. RADIATION DOSE REDUCTION: This exam was performed according to the departmental dose-optimization program which includes automated exposure control, adjustment of the mA and/or kV according to patient size and/or use of iterative reconstruction technique. COMPARISON:  Brain MRI 02/16/2022. Head CT 02/15/2022. FINDINGS: Motion degraded exam. Brain: Mild generalized parenchymal atrophy. 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 There is no acute intracranial hemorrhage. No acute demarcated cortical infarct. No extra-axial fluid collection. No evidence of an intracranial mass. No evidence of hydrocephalus. No midline shift. Vascular: No hyperdense vessel.  Atherosclerotic calcifications. Skull: Normal. Negative for fracture or focal lesion. Sinuses/Orbits: Within the limitations of motion degradation, there is no appreciable acute orbital abnormality. Fluid level and frothy secretions within the left sphenoid sinus. Frothy secretions within the right sphenoid sinus. Mucosal thickening and/or fluid  within the left ethmoid air cells. IMPRESSION: Motion degraded exam. No evidence of acute intracranial abnormality. Specifically, there is no hydrocephalus. Please refer to the recent prior brain MRI 02/16/2022 for a description of small-volume debris layering within the lateral ventricle occipital horns at that time. 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 Mild generalized parenchymal atrophy. Paranasal sinus disease at the imaged levels, as described. Electronically Signed   By: Kellie Simmering D.O.   On: 02/21/2022 11:48   CT CHEST W CONTRAST  Result Date:  03/08/2022 CLINICAL DATA:  Abnormal x-ray, lung nodule. EXAM: CT CHEST WITH CONTRAST TECHNIQUE: Multidetector CT imaging of the chest was performed during intravenous contrast administration. RADIATION DOSE REDUCTION: This exam was performed according to the departmental dose-optimization program which includes automated exposure control, adjustment of the mA and/or kV according to patient size and/or use of iterative reconstruction technique. CONTRAST:  72m OMNIPAQUE IOHEXOL 300 MG/ML  SOLN COMPARISON:  03/07/2022. FINDINGS: Cardiovascular: The heart is normal in size and there is no pericardial effusion. The aorta and pulmonary trunk are normal in caliber. Mediastinum/Nodes: Thyroid gland is within normal limits. An endotracheal tube is noted. An enteric tube is seen in the esophagus and proximal stomach. No mediastinal, hilar, or axillary lymphadenopathy. Lungs/Pleura: There is a moderate-to-large right pneumothorax anteriorly with right lower lobe collapse. Nodular opacity is noted in the right upper lobe measuring 2.3 cm, axial image 35. Dependent atelectasis is noted in the left lung. No effusion is identified. Upper Abdomen: Fatty infiltration of the liver is noted. The spleen is mildly enlarged. Musculoskeletal: Posterior cervical spinal fusion hardware and laminectomy changes are noted. No acute fracture is seen. IMPRESSION: 1. Moderate to large right pneumothorax anteriorly with right lower lobe collapse. 2. 2.3 cm nodule in the right upper lobe which may represent pulmonary hemorrhage, versus infectious, inflammatory, or neoplastic process. 3. Hepatic steatosis. 4. Mild splenomegaly. Critical findings were reported to Dr. JMauri Brooklynat 1:53 a.m. Electronically Signed   By: LBrett FairyM.D.   On: 03/08/2022 01:56   MR BRAIN W WO CONTRAST  Result Date: 03/08/2022 CLINICAL DATA:  Spinal meningitis and epidural abscess with abnormal intracranial findings on cervical spine MRI EXAM: MRI HEAD WITHOUT  AND WITH CONTRAST TECHNIQUE: Multiplanar, multiecho pulse sequences of the brain and surrounding structures were obtained without and with intravenous contrast. CONTRAST:  911mGADAVIST GADOBUTROL 1 MMOL/ML IV SOLN COMPARISON:  02/16/2022 MRI brain FINDINGS: Brain: There is diffusely abnormal sulcal signal on T2 FLAIR. There is increased underlying cortical/subcortical edema in the left frontal lobe involving middle and inferior gyri. There is some ill-defined enhancement in this region. Few small foci of enhancement identified in the cerebellum and were more evident on the cervical spine MRI. There is persistent small volume layering debris in the occipital horns with reduced diffusion. Ventricle caliber is slightly increased. New patchy diffusion hyperintensity in the central pons with corresponding T2 hyperintensity. No associated enhancement. Vascular: Major vessel flow voids at the skull base are preserved. Skull and upper cervical spine: Normal marrow signal is preserved. Sinuses/Orbits: Minor mucosal thickening.  Orbits are unremarkable. Other: Sella is unremarkable.  Patchy mastoid fluid opacification. IMPRESSION: Meningitis with mildly progressive abnormal signal in the peripheral left frontal lobe likely reflecting adjacent encephalitis. Few small foci of cerebellar enhancement are probably leptomeningeal and reflect meningitis. Persistent small volume layering debris in the lateral ventricles again reflecting meningitis. Ventricle caliber is slightly increased and there may be early hydrocephalus. New abnormal signal in  the central pons reflecting late acute to subacute ischemia. Electronically Signed   By: Macy Mis M.D.   On: 03/08/2022 10:29   MR CERVICAL SPINE W WO CONTRAST  Result Date: 03/16/2022 CLINICAL DATA:  56 year old female. 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 Meningitis. Quadriplegia. Status post 02/24/2022 C3 through T1 posterior decompression of  epidural abscess, C3 through C7 posterior fusion. And status post 03/01/2022 posterior epidural abscess evacuation from T12 utilizing pediatric feeding tube assisted evacuation of frank pus and necrotic fatty tissue from the thoracolumbar epidural space. Persistent fever and leukocytosis now. EXAM: MRI CERVICAL SPINE WITHOUT AND WITH CONTRAST TECHNIQUE: Multiplanar and multiecho pulse sequences of the cervical spine, to include the craniocervical junction and cervicothoracic junction, were obtained without and with intravenous contrast. CONTRAST:  9.91m GADAVIST GADOBUTROL 1 MMOL/ML IV SOLN COMPARISON:  Postoperative cervical spine MRI 03/07/2022. FINDINGS: Alignment: Stable straightening of cervical lordosis. No significant spondylolisthesis. Vertebrae: Posterior decompression from C3 through the superior T1 lamina. Bilateral posterior spinal fusion hardware with mild susceptibility artifact. No convincing marrow edema. Background bone marrow signal is within normal limits. Cord: Cervical spinal cord signal and morphology now appears normal. No epidural or intrathecal collection identified. Mild posterior dural thickening and enhancement suspected, no abnormal intradural enhancement. Posterior Fossa, vertebral arteries, paraspinal tissues: Cervicomedullary junction is within normal limits. Grossly stable visible brain parenchyma, conspicuous dilated perivascular spaces in the pons. No abnormal posterior fossa enhance min identified. Posterior decompression and fusion surgical changes with fairly simple appearing fluid collection in the laminectomy space, no associated mass effect on the thecal sac. No unexpected paraspinal soft tissue inflammation. Partially visible tracheostomy tube at the thoracic inlet. Partially visible enteric tube coursing into the esophagus. Disc levels: C2-C3 and C3-C4 levels appear stable, negative. C4-C5 through C6-C7. Subtle increased T2 and STIR signal within the disc spaces, which  appear chronically degenerated otherwise with disc space loss and circumferential disc osteophyte complex. Of these, there is postcontrast enhancement of the C5-C6 disc space (series 28, image 9), which is stable or regressed since 03/07/2022. No convincing new endplate erosion there. Capacious cervical spinal canal following decompression. C7-T1: Stable and negative. IMPRESSION: 1. Abnormal enhancement of the C5-C6 disc space strongly suggestive of Discitis, and additional trace fluid signal in the adjacent C4-C5 and C6-C7 discs. But no convincing progression since 03/07/2022 and no convincing endplate Osteomyelitis. 2. Satisfactory postoperative appearance of posterior decompression and fusion from C3 through C7. Midline postoperative seroma. Electronically Signed   By: HGenevie AnnM.D.   On: 03/16/2022 11:44   MR CERVICAL SPINE W WO CONTRAST  Result Date: 03/07/2022 CLINICAL DATA:  Epidural abscess EXAM: MRI CERVICAL AND THORACIC SPINE WITHOUT AND WITH CONTRAST TECHNIQUE: Multiplanar and multiecho pulse sequences of the cervical spine, to include the craniocervical junction and cervicothoracic junction, and the thoracic spine, were obtained without and with intravenous contrast. CONTRAST:  972mGADAVIST GADOBUTROL 1 MMOL/ML IV SOLN COMPARISON:  02/23/2022 FINDINGS: Evaluation is somewhat limited by motion artifact. MRI CERVICAL SPINE FINDINGS Alignment: Straightening of the normal cervical lordosis. No significant listhesis. Vertebrae: Status post interval posterior decompression and fixation C3-C7. Susceptibility artifact from the hardware limits evaluation at these levels. Increased T2 signal and enhancement about the C4-C5 and C5-C6 discs and endplates, without definite loss of cortical signal. Cord: Evaluation is limited by susceptibility. The imaged portions of the cord are normal in signal and morphology. Previously noted epidural abscess is no longer seen. Posterior Fossa, vertebral arteries, paraspinal  tissues: Extensive edema in  the soft tissues posterior to the cervical spine with a small fluid collection in the subcutaneous fat (series 9, image 23). Additional small fluid collection about the spinous process of T1 (series 9, image 34). These are not unexpected postoperatively. No evidence of a deeper fluid collection near the thecal sac. Enhancing lesions are seen in the cerebellum (series 23, image 7), which were not on the prior exam, incompletely evaluated. Decreased leptomeningeal enhancement about the base of the brain and posterior fossa. Disc levels: C2-C3: No significant disc bulge. No spinal canal stenosis or neuroforaminal narrowing. C3-C4: Status post decompression. No spinal canal stenosis or neural foraminal narrowing. C4-C5: Disc height loss with disc osteophyte complex. Status post decompression. No spinal canal stenosis or neural foraminal narrowing. C5-C6: Disc height loss with disc osteophyte complex. Status post decompression. No spinal canal stenosis or definite neural foraminal narrowing. C6-C7: Disc height loss with disc osteophyte complex. Status post decompression. No spinal canal stenosis. Moderate left neural foraminal narrowing. C7-T1: Small right paracentral disc protrusion. No spinal canal stenosis or neural foraminal narrowing. MRI THORACIC SPINE FINDINGS Alignment:  Physiologic. Vertebrae: Diffusely decreased marrow signal which is nonspecific but can be seen with anemia, smoking, and obesity. No acute fracture or suspicious osseous lesion. Cord: Previously noted epidural collection is now not definitively seen until the level of T11, where it is significantly decreased in size, status post interval left hemilaminectomy and drainage at T11-T12. The collection measures up to 5 mm at the level of T12, previously 9 mm when remeasured similarly. The collection does appear to extend off the inferior field of view into the lumbar epidural space. Paraspinal and other soft tissues:  Postsurgical changes posterior to T11 and T12. No evidence of significant fluid collection in the soft tissues. Disc levels: No significant residual spinal canal stenosis in the thoracic spine. IMPRESSION: 1. Evaluation is somewhat limited by motion artifact. Within this limitation, there has been interval posterior decompression and fixation at C3-C7 and left hemilaminectomy at T11-T12, with drainage of the majority of the previously noted epidural abscess. Epidural collection is now seen from the level of T11, extending off the inferior field of view in the lumbar spine, decreased in size from the prior exam, measuring up to 5 mm at the level of T12, previously 9 mm when remeasured similarly. 2. Enhancing lesions are noted in the imaged portion of the cerebellum, which is incompletely evaluated. An MRI with and without contrast is recommended for further evaluation. 3. Edema in the soft tissues posterior to the cervical spine with a small fluid collection in the subcutaneous fat and about the spinous process of T1, which are not unexpected postoperatively. No evidence of a deeper fluid collection near the thecal sac or abscess. 4. Evaluation of the cervical spine is limited by susceptibility, however there appears to be increased T2 signal and enhancement about the C4-C5 and C5-C6 discs and endplates, without definite loss cortical signal. This may be inflammatory or degenerative but could also be seen with early discitis osteomyelitis. Attention on follow-up. 5. No residual spinal canal stenosis in the cervical or thoracic spine. These results will be called to the ordering clinician or representative by the Radiologist Assistant, and communication documented in the PACS or [employer]. Electronically Signed   By: Merilyn Baba M.D.   On: 03/07/2022 01:44   MR CERVICAL SPINE W WO CONTRAST  Result Date: 02/23/2022 CLINICAL DATA:  Initial evaluation for epidural abscess. EXAM: MRI CERVICAL SPINE WITHOUT  AND WITH CONTRAST TECHNIQUE: Multiplanar  and multiecho pulse sequences of the cervical spine, to include the craniocervical junction and cervicothoracic junction, were obtained without and with intravenous contrast. CONTRAST:  58m GADAVIST GADOBUTROL 1 MMOL/ML IV SOLN COMPARISON:  Comparison made with MRIs of the thoracic and lumbar spine performed on the same day. FINDINGS: Alignment: Straightening of the normal cervical lordosis. No significant listhesis. Vertebrae: Vertebral body height maintained without acute or chronic fracture. Bone marrow signal intensity diffusely decreased on T1 weighted sequence, nonspecific, but most commonly related to anemia, smoking or obesity. No discrete or worrisome osseous lesions. No findings to suggest osteomyelitis discitis or septic arthritis within the cervical spine. Cord: Extensive long segment epidural abscess extends from the thoracic spine into the cervical spine to the level of the cervicomedullary junction. Abscess primarily localized within the dorsal epidural space and is greater on the left. Collection is maximal in nature at approximately the level of C4-5, measuring up to 7 mm in maximal AP diameter. Secondary moderate to severe diffuse spinal stenosis with mild cord flattening, but no cord signal changes. Posterior Fossa, vertebral arteries, paraspinal tissues: Scattered dural and leptomeningeal enhancement seen about the visualized base of the brain and posterior fossa, consistent with associated meningitis. No visible intracranial collections. Craniocervical junction remains patent. Paraspinous soft tissues demonstrate no acute finding. No soft tissue collections. Absent flow void within the left vertebral artery, likely occluded. Endotracheal tube in place. Disc levels: C2-C3: Negative interspace. Left dorsal epidural abscess with moderate spinal stenosis. Foramina remain patent. C3-C4: Mild disc bulge with uncovertebral spurring. Left dorsal epidural  abscess. Moderate to severe spinal stenosis. Superimposed right-sided facet hypertrophy. No significant foraminal encroachment. C4-C5: Intervertebral disc space narrowing with diffuse disc osteophyte complex, with left greater than right uncovertebral and facet hypertrophy. Dorsal epidural abscess on the left. Moderate to severe spinal stenosis with moderate left C5 foraminal narrowing. C5-C6: Degenerative intervertebral disc space narrowing with diffuse disc osteophyte complex. Left dorsal epidural abscess. Severe spinal stenosis with cord flattening but no definite cord signal changes. Severe bilateral C6 foraminal narrowing. C6-C7: Degenerative intervertebral disc space narrowing with diffuse disc osteophyte. Thin left dorsal epidural abscess. Moderate to severe spinal stenosis. No visible cord signal changes. Moderate left worse than right C7 foraminal narrowing. C7-T1: Small right paracentral disc protrusion. Dorsal epidural abscess. Moderate spinal stenosis. Left-sided facet arthrosis. Foramina remain patent. Delete that IMPRESSION: 1. Extensive epidural abscess extending from the thoracic spine into the cervical spine to the level of the cervicomedullary junction. Secondary moderate to severe diffuse spinal stenosis throughout the cervical spine, most pronounced at C5-6. No visible cord signal changes. 2. Dural and leptomeningeal enhancement about the visualized base of brain and posterior fossa, consistent with associated meningitis. No visible intracranial collections. 3. Underlying moderate degenerative spondylosis and facet arthrosis, most pronounced at C4-5 through C6-7. Associated moderate left C5 and bilateral C7 foraminal stenosis, with severe bilateral C6 foraminal narrowing. 4. Absent flow void within the left vertebral artery, likely occluded. Electronically Signed   By: BJeannine BogaM.D.   On: 02/23/2022 19:31   MR THORACIC SPINE W WO CONTRAST  Addendum Date: 03/16/2022   ADDENDUM  REPORT: 03/16/2022 12:50 ADDENDUM: After reviewing the lumbar images today, it is apparent that a ventral thoracic epidural or subdural collection extends from approximately T10-T11 caudally to the L1 level. This measures up to 6 mm in thickness and dorsally displaces the lower thoracic cord. See series 15, image 39 and series 12 image 6. But there is very little associated meningeal thickening and enhancement.  And the fluid within the collection is near CSF intensity. CONCLUSION: CONCLUSION Additional Ventral Thoracic Epidural Or Subdural Fluid Collection tracking from T10 through L1 measures up to 6 mm in thickness and dorsally displaces the lower thoracic spinal cord. This is most apparent on series 12, image 6, series 15, image 39, and on the sagittal Lumbar MRI today which is reported separately. This and the other Cervical, Thoracic, and Lumbar MRI findings today were discussed by telephone with Dr. Kristeen Miss on 03/16/2022 at 12:46. Electronically Signed   By: Genevie Ann M.D.   On: 03/16/2022 12:50   Result Date: 03/16/2022 CLINICAL DATA:  56 year old female. 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 Meningitis. Quadriplegia. Status post 02/24/2022 C3 through T1 posterior decompression of epidural abscess, C3 through C7 posterior fusion. And status post 03/01/2022 posterior epidural abscess evacuation from T12 utilizing pediatric feeding tube assisted evacuation of frank pus and necrotic fatty tissue from the thoracolumbar epidural space. Persistent fever and leukocytosis now. EXAM: MRI THORACIC WITHOUT AND WITH CONTRAST TECHNIQUE: Multiplanar and multiecho pulse sequences of the thoracic spine were obtained without and with intravenous contrast. CONTRAST:  9.45m GADAVIST GADOBUTROL 1 MMOL/ML IV SOLN in conjunction with contrast enhanced imaging of the cervical spine reported separately. COMPARISON:  Cervical spine MRI today reported separately. Postoperative thoracic MRI 03/06/2022.  FINDINGS: Limited cervical spine imaging:  Reported separately today. Thoracic spine segmentation: Appears to be normal, same numbering system used previously. Alignment: Stable thoracic kyphosis. No significant spondylolisthesis. Vertebrae: Lower cervical posterior element fusion hardware. Fairly subtle postoperative changes to the posterior elements at T11 and T12. No marrow edema or evidence of acute osseous abnormality. Background bone marrow signal is within normal limits. Cord: Small foci of abnormal signal in the lateral thoracic upper thoracic cord at T2 and T3 (series 15, images 7 and 8). Relatively maintained thoracic cord volume. No associated abnormal cord enhancement. Distortion of the normal thoracic cord morphology in the mid to lower thoracic spine, most notable at T8-T9 (series 15, image 26) appears stable and may be due to arachnoid adhesions. No significant leptomeningeal enhancement at this level. But there is widespread dorsal thoracic mild dural thickening and enhancement. And beginning at the T11-T12 level there is either ventral leptomeningeal enhancement of the cord and/or ventral pachymeningeal enhancement which continues into the upper lumbar levels (series 26, images 6 and 7). No definite conus signal abnormality. However, there is a small T10-T11 thoracic cord syrinx (series 15, image 34). Substantially improved thoracic dorsal epidural space from T10 inferiorly, but there do appear to be dorsal arachnoid adhesions and/or dorsal epidural granulation tissue remaining at T11-T12 (series 26, image 8). Trace postoperative seroma in the laminectomy space there. Lumbar levels are detailed separately today. Paraspinal and other soft tissues: Mild postoperative changes to the posterior thoracic paraspinal soft tissues. No paraspinal fluid collection. Grossly negative visible chest and upper abdominal viscera. Disc levels: No thoracic spinal stenosis now. Degenerative appearing right paracentral  disc protrusion at T7-T8 is more apparent, but without significant stenosis. Similar left paracentral disc protrusion at T9-T10 appears stable. IMPRESSION: 1. Further improved appearance of the Thoracic Spine since 03/06/2022, but note: - abnormal upper thoracic lateral cord signal abnormality at T2 and T3, perhaps sequelae of small Cord Infarcts. 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 - continued abnormal dorsal thoracic dural thickening and enhancement, and  evidence of additional pachymeningeal or leptomeningeal abnormality at the cauda equina and upper Lumbar Spine (detailed separately). 2. No thoracic discitis osteomyelitis. No thoracic spinal stenosis. Small degenerative disc herniations at T7-T8 and T9-8 T9. Electronically Signed: By: Genevie Ann M.D. On: 03/16/2022 12:04   MR THORACIC SPINE W WO CONTRAST  Result Date: 03/07/2022 CLINICAL DATA:  Epidural abscess EXAM: MRI CERVICAL AND THORACIC SPINE WITHOUT AND WITH CONTRAST TECHNIQUE: Multiplanar and multiecho pulse sequences of the cervical spine, to include the craniocervical junction and cervicothoracic junction, and the thoracic spine, were obtained without and with intravenous contrast. CONTRAST:  55m GADAVIST GADOBUTROL 1 MMOL/ML IV SOLN COMPARISON:  02/23/2022 FINDINGS: Evaluation is somewhat limited by motion artifact. MRI CERVICAL SPINE FINDINGS Alignment: Straightening of the normal cervical lordosis. No significant listhesis. Vertebrae: Status post interval posterior decompression and fixation C3-C7. Susceptibility artifact from the hardware limits evaluation at these levels. Increased T2 signal and enhancement about the C4-C5 and C5-C6 discs and endplates, without definite loss of cortical signal. Cord: Evaluation is limited by susceptibility. The imaged portions of the cord are normal  in signal and morphology. Previously noted epidural abscess is no longer seen. Posterior Fossa, vertebral arteries, paraspinal tissues: Extensive edema in the soft tissues posterior to the cervical spine with a small fluid collection in the subcutaneous fat (series 9, image 23). Additional small fluid collection about the spinous process of T1 (series 9, image 34). These are not unexpected postoperatively. No evidence of a deeper fluid collection near the thecal sac. Enhancing lesions are seen in the cerebellum (series 23, image 7), which were not on the prior exam, incompletely evaluated. Decreased leptomeningeal enhancement about the base of the brain and posterior fossa. Disc levels: C2-C3: No significant disc bulge. No spinal canal stenosis or neuroforaminal narrowing. C3-C4: Status post decompression. No spinal canal stenosis or neural foraminal narrowing. C4-C5: Disc height loss with disc osteophyte complex. Status post decompression. No spinal canal stenosis or neural foraminal narrowing. C5-C6: Disc height loss with disc osteophyte complex. Status post decompression. No spinal canal stenosis or definite neural foraminal narrowing. C6-C7: Disc height loss with disc osteophyte complex. Status post decompression. No spinal canal stenosis. Moderate left neural foraminal narrowing. C7-T1: Small right paracentral disc protrusion. No spinal canal stenosis or neural foraminal narrowing. MRI THORACIC SPINE FINDINGS Alignment:  Physiologic. Vertebrae: Diffusely decreased marrow signal which is nonspecific but can be seen with anemia, smoking, and obesity. No acute fracture or suspicious osseous lesion. Cord: Previously noted epidural collection is now not definitively seen until the level of T11, where it is significantly decreased in size, status post interval left hemilaminectomy and drainage at T11-T12. The collection measures up to 5 mm at the level of T12, previously 9 mm when remeasured similarly. The collection  does appear to extend off the inferior field of view into the lumbar epidural space. Paraspinal and other soft tissues: Postsurgical changes posterior to T11 and T12. No evidence of significant fluid collection in the soft tissues. Disc levels: No significant residual spinal canal stenosis in the thoracic spine. IMPRESSION: 1. Evaluation is somewhat limited by motion artifact. Within this limitation, there has been interval posterior decompression and fixation at C3-C7 and left hemilaminectomy at T11-T12, with drainage of the majority of the previously noted epidural abscess. Epidural collection is now seen from the level of T11, extending off the inferior field of view in the lumbar spine, decreased in size from the prior exam, measuring up to 5 mm at the level of T12, previously  9 mm when remeasured similarly. 2. Enhancing lesions are noted in the imaged portion of the cerebellum, which is incompletely evaluated. An MRI with and without contrast is recommended for further evaluation. 3. Edema in the soft tissues posterior to the cervical spine with a small fluid collection in the subcutaneous fat and about the spinous process of T1, which are not unexpected postoperatively. No evidence of a deeper fluid collection near the thecal sac or abscess. 4. Evaluation of the cervical spine is limited by susceptibility, however there appears to be increased T2 signal and enhancement about the C4-C5 and C5-C6 discs and endplates, without definite loss cortical signal. This may be inflammatory or degenerative but could also be seen with early discitis osteomyelitis. Attention on follow-up. 5. No residual spinal canal stenosis in the cervical or thoracic spine. These results will be called to the ordering clinician or representative by the Radiologist Assistant, and communication documented in the PACS or [employer]. Electronically Signed   By: Merilyn Baba M.D.   On: 03/07/2022 01:44   MR THORACIC SPINE W WO  CONTRAST  Result Date: 02/27/2022 CLINICAL DATA:  Acute myelopathy. Follow-up spinal epidural abscess. Status post cervical decompression. EXAM: MRI THORACIC WITHOUT AND WITH CONTRAST TECHNIQUE: Multiplanar and multiecho pulse sequences of the thoracic spine were obtained without and with intravenous contrast. CONTRAST:  35m GADAVIST GADOBUTROL 1 MMOL/ML IV SOLN COMPARISON:  MRI thoracic spine 02/23/2022 FINDINGS: Alignment:  Normal Vertebrae: Negative for fracture or mass. No evidence of discitis in the thoracic spine Cord: Mild cord hyperintensity bilaterally at T2-3. This is not seen on the prior study but is a subtle finding. Otherwise normal spinal cord signal. Extensive posterior epidural abscess throughout the thoracic spine is similar in size. This is predominately posteriorly located and extends to the left of midline in the upper thoracic spine. Mild spinal stenosis T7 through T12 due to abscess. Paraspinal and other soft tissues: No abscess is seen outside of the spinal canal. No significant pleural effusion. 2.2 cm spiculated enhancing mass right upper lobe, concerning for carcinoma. Disc levels: Shallow central disc protrusion at T7-8. Small left-sided disc protrusion T9-10. These are unchanged from the prior MRI. IMPRESSION: Extensive thoracic epidural abscess is similar in size to the recent MRI of 02/23/2022. There is mild spinal stenosis T7 through T12 due to abscess, unchanged. No evidence of discitis osteomyelitis. Mild cord hyperintensity at T2-3, not seen on the prior study however this is a subtle finding. No significant spinal stenosis level. 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 2.2 cm spiculated enhancing mass right upper lobe concerning for carcinoma. Recommend CT chest with contrast for further evaluation. Electronically Signed   By: CFranchot GalloM.D.   On: 02/27/2022 17:36   MR THORACIC SPINE W WO CONTRAST  Result Date: 02/23/2022 CLINICAL DATA:  Low back pain. Meningitis. Concern for  epidural abscess. EXAM: MRI THORACIC AND LUMBAR SPINE WITHOUT AND WITH CONTRAST TECHNIQUE: Multiplanar and multiecho pulse sequences of the thoracic and lumbar spine were obtained without and with intravenous contrast. CONTRAST:  180mGADAVIST GADOBUTROL 1 MMOL/ML IV SOLN COMPARISON:  Noncontrast lumbar spine MRI 02/14/2022. Chest radiograph 02/18/2022. FINDINGS: MRI THORACIC SPINE FINDINGS Alignment:  No listhesis. Vertebrae: Diffusely decreased bone marrow T1 signal intensity, nonspecific though can be seen with anemia, smoking, and obesity. No fracture, suspicious marrow lesion, or evidence of discitis. Complex, peripherally enhancing dorsal epidural fluid collection throughout the entirety of the thoracic spine with some internal septations. The collection measures up to 9 mm in AP thickness. Cord:  Mild-to-moderate mass effect on the spinal cord by the epidural fluid collection. No cord signal abnormality. Paraspinal and other soft tissues: Likely dependent atelectasis in both lungs. 2.5 cm focal signal abnormality in the right lung apex which appears to extend to the pleura with a corresponding opacity on the prior chest radiograph. Disc levels: Severe spinal stenosis throughout the thoracic spine due to the epidural collection. Small right paracentral disc protrusion at T7-8 and small left paracentral disc protrusion at T9-10. MRI LUMBAR SPINE FINDINGS Segmentation: Counting down from the craniocervical junction, the lowest lumbar type vertebrae appears to reflect a completely lumbarized S1. Alignment:  Normal. Vertebrae: Diffusely decreased bone marrow T1 signal intensity as noted above. No fracture, suspicious marrow lesion, or evidence of discitis. Edema and enhancement surrounding the right L4-5 and bilateral L5-S1 facet joints with associated joint effusions. Peripherally enhancing dorsal epidural fluid collection (with less pronounced ventral component as well) throughout the entirety of the lumbar spine  measuring up to 8 mm in AP thickness. Conus medullaris: Extends to the L1-2 level. Diffuse dural enhancement and suspected enhancement of the cauda equina nerve roots. Paraspinal and other soft tissues: Inflammation in the soft tissues posterior to the L4-S1 facet joints bilaterally. Disc levels: Moderate to severe spinal stenosis throughout the lumbar spine due to the epidural fluid collection. Mild disc bulging from L3-4 to L5-S1 with a superimposed small right subarticular to right foraminal disc protrusion at L3-4. No compressive neural foraminal stenosis. IMPRESSION: 1. 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 Associated severe diffuse spinal stenosis without evidence of cord edema. 2. Suspected enhancement along the cauda equina likely related to known meningitis. 3. Inflammation about the L4-5 and L5-S1 facet joints, suspicious for septic arthritis in this setting. 4. 2.5 cm focal signal abnormality in the right lung apex. Radiographic follow-up is recommended as infectious and neoplastic causes are possible. These results will be called to the ordering clinician or representative by the Radiologist Assistant, and communication documented in the PACS or [employer]. Electronically Signed   By: Logan Bores M.D.   On: 02/23/2022 16:13   MR Lumbar Spine W Wo Contrast  Result Date: 03/16/2022 CLINICAL DATA:  56 year old female. 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 Quadriplegia. Status post 02/24/2022 C3 through T1 posterior decompression of epidural abscess, C3 through C7 posterior fusion. And status post 03/01/2022 posterior epidural abscess evacuation from T12 utilizing pediatric feeding tube assisted evacuation of frank pus and necrotic fatty tissue from the thoracolumbar epidural space. Persistent fever and leukocytosis now. EXAM: MRI LUMBAR SPINE WITHOUT AND WITH CONTRAST TECHNIQUE: Multiplanar  and multiecho pulse sequences of the lumbar spine were obtained without and with intravenous contrast. CONTRAST:  9.38m GADAVIST GADOBUTROL 1 MMOL/ML IV SOLN in conjunction with contrast enhanced imaging of the cervical and thoracic reported separately. COMPARISON:  Thoracic MRI today. Prior lumbar MRI 02/23/2022 and earlier. FINDINGS: Segmentation: Transitional anatomy, lumbarized S1 level which is the same numbering system used on 02/23/2022. Correlation with radiographs is recommended prior to any operative intervention. Alignment: Stable lumbar lordosis. No significant spondylolisthesis. Vertebrae: Dense marrow edema and enhancement now in the L5 spinous process (series 24, image 8) with moderate to extensive heterogeneous marrow edema also in the bilateral L5 facets, pedicles, S1 superior articulating facets, and involving the posterior L5 vertebral body left greater than right. See series 24 images 5 and 11. Bilateral L5 transverse process is also affected. But no L5 endplate erosion.  No other S1 or visible sacral marrow edema or enhancement. No L1 through L4 marrow edema or enhancement. Conus medullaris and cauda equina: Conus extends to the L1-L2 level. There is extensive abnormal intrathecal enhancement throughout the lumbar spine, beginning from the dorsal thoracic dural thickening and enhancement, becoming circumferential in the upper lumbar spine (series 25, image 7). An although there is abnormal thickening of the cauda equina nerve roots, there is only subtle associated leptomeningeal enhancement in the lumbar spine. However, there are several small areas of loculated fluid in the cauda equina, most notably at the ventral L2-L3 level (along a segment of 19 mm series 19, image 8 and series 22, image 8). See also the left lateral recess of L3 on series 22, image 14, central L4 thecal sac on image 20. Ongoing inflammation throughout the lumbar epidural space, but no other discrete intraspinal fluid  collection. However, it is apparent on these images if there is an abnormal ventral thoracic fluid collection tracking inferiorly to the T12-L1 level. See series 20, image 8. Paraspinal and other soft tissues: Extensive lumbar erector spinae muscle edema, most pronounced at the L5-S1 posterior elements. No discrete paraspinal abscess. Grossly negative visible abdominal viscera. Partially visible lobulated fibroid uterus. Disc levels: Lumbar disc spaces do not appear infected. And there is relatively mild disc disease. There is a broad-based right foraminal disc herniation at L3-L4 (series 22, image 17 and series 19, image 6) with moderate right L3 foraminal stenosis. There is a small S1-S2 posterior disc protrusion and annular fissure. IMPRESSION: 1. 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 This is likely the sequelae of bilateral septic facets at that level. Associated lumbar erector spinae muscle edema, but no discrete paraspinal abscess. 2. The lumbar thecal sac remains highly abnormal, with diffuse pachymeningeal thickening and enhancement, diffuse thickening of the cauda equina nerve roots, and several small loculated fluid collections amongst the cauda equina, suspicious for residual small intrathecal abscesses. 3. No convincing lumbar discitis at this time. Electronically Signed   By: Genevie Ann M.D.   On: 03/16/2022 12:21   MR Lumbar Spine W Wo Contrast  Result Date: 02/23/2022 CLINICAL DATA:  Low back pain. Meningitis. Concern for epidural abscess. EXAM: MRI THORACIC AND LUMBAR SPINE WITHOUT AND WITH CONTRAST TECHNIQUE: Multiplanar and multiecho pulse sequences of the thoracic and lumbar spine were obtained without and with intravenous contrast. CONTRAST:  23m GADAVIST GADOBUTROL 1 MMOL/ML IV SOLN COMPARISON:  Noncontrast lumbar spine MRI 02/14/2022. Chest radiograph 02/18/2022. FINDINGS: MRI THORACIC SPINE FINDINGS Alignment:  No listhesis.  Vertebrae: Diffusely decreased bone marrow T1 signal intensity, nonspecific though can be seen with anemia, smoking, and obesity. No fracture, suspicious marrow lesion, or evidence of discitis. Complex, peripherally enhancing dorsal epidural fluid collection throughout the entirety of the thoracic spine with some internal septations. The collection measures up to 9 mm in AP thickness. Cord: Mild-to-moderate mass effect on the spinal cord by the epidural fluid collection. No cord signal abnormality. Paraspinal and other soft tissues: Likely dependent atelectasis in both lungs. 2.5 cm focal signal abnormality in the right lung apex which appears to extend to the pleura with a corresponding opacity on the prior chest radiograph. Disc levels: Severe spinal stenosis throughout the thoracic spine due to the epidural collection. Small right paracentral disc protrusion at T7-8 and small left paracentral disc protrusion at T9-10. MRI LUMBAR SPINE FINDINGS Segmentation: Counting down from the craniocervical junction, the lowest lumbar type vertebrae appears to reflect a completely  lumbarized S1. Alignment:  Normal. Vertebrae: Diffusely decreased bone marrow T1 signal intensity as noted above. No fracture, suspicious marrow lesion, or evidence of discitis. Edema and enhancement surrounding the right L4-5 and bilateral L5-S1 facet joints with associated joint effusions. Peripherally enhancing dorsal epidural fluid collection (with less pronounced ventral component as well) throughout the entirety of the lumbar spine measuring up to 8 mm in AP thickness. Conus medullaris: Extends to the L1-2 level. Diffuse dural enhancement and suspected enhancement of the cauda equina nerve roots. Paraspinal and other soft tissues: Inflammation in the soft tissues posterior to the L4-S1 facet joints bilaterally. Disc levels: Moderate to severe spinal stenosis throughout the lumbar spine due to the epidural fluid collection. Mild disc bulging  from L3-4 to L5-S1 with a superimposed small right subarticular to right foraminal disc protrusion at L3-4. No compressive neural foraminal stenosis. IMPRESSION: 1. 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 Associated severe diffuse spinal stenosis without evidence of cord edema. 2. Suspected enhancement along the cauda equina likely related to known meningitis. 3. Inflammation about the L4-5 and L5-S1 facet joints, suspicious for septic arthritis in this setting. 4. 2.5 cm focal signal abnormality in the right lung apex. Radiographic follow-up is recommended as infectious and neoplastic causes are possible. These results will be called to the ordering clinician or representative by the Radiologist Assistant, and communication documented in the PACS or [employer]. Electronically Signed   By: Logan Bores M.D.   On: 02/23/2022 16:13   DG CHEST PORT 1 VIEW  Result Date: 03/11/2022 CLINICAL DATA:  Follow-up pneumothorax EXAM: PORTABLE CHEST 1 VIEW COMPARISON:  03/09/2022 FINDINGS: No significant change in AP portable chest radiograph. Right-sided pigtail chest tube remains in position about the right apex without appreciable residual pneumothorax. No acute airspace opacity. Tracheostomy. Partially imaged enteric feeding tube. Heart and mediastinum are unremarkable. IMPRESSION: 1. No significant change in AP portable chest radiograph. Right-sided pigtail chest tube remains in position about the right apex without appreciable residual pneumothorax. No acute airspace opacity. 2.  Tracheostomy. Electronically Signed   By: Delanna Ahmadi M.D.   On: 03/11/2022 12:13   DG Chest Port 1 View  Result Date: 03/09/2022 CLINICAL DATA:  Status post trach EXAM: PORTABLE CHEST 1 VIEW COMPARISON:  Chest x-ray dated Mar 08, 2022 FINDINGS: Interval placement of tracheostomy tube with tip projecting over the midthoracic trachea. Feeding tube partially  visualized coursing below the diaphragm. Cardiac and mediastinal contours within normal limits. New mild right basilar opacity. No large pleural effusion or pneumothorax. Right-sided chest tube in place. No evidence pneumothorax. IMPRESSION: 1. Interval placement of tracheostomy tube. 2. New mild right basilar opacity, likely due to atelectasis, infection or aspiration could appear similar. Electronically Signed   By: Yetta Glassman M.D.   On: 03/09/2022 16:02   DG CHEST PORT 1 VIEW  Result Date: 03/08/2022 CLINICAL DATA:  Status post chest tube placement. EXAM: PORTABLE CHEST 1 VIEW COMPARISON:  Mar 07, 2022 FINDINGS: Since the prior study there is been interval placement of a right-sided chest tube. Its distal tip is seen overlying the medial aspect of the right apex. There is predominant stable endotracheal tube and orogastric tube positioning. An ill-defined pulmonary nodule is again seen within the right upper lobe. There is no evidence of acute infiltrate or pleural effusion. A very small residual right apical pneumothorax is seen. The heart size and mediastinal contours are within normal limits. Both lungs are  clear. The visualized skeletal structures are unremarkable. IMPRESSION: 1. Interval right-sided chest tube placement positioning, as described above. 2. Very small residual right apical pneumothorax. 3. Stable right upper lobe pulmonary nodule. Electronically Signed   By: Virgina Norfolk M.D.   On: 03/08/2022 03:07   DG Chest Port 1 View  Result Date: 03/07/2022 CLINICAL DATA:  Status post intubation. EXAM: PORTABLE CHEST 1 VIEW COMPARISON:  02/26/2022 FINDINGS: The endotracheal tube tip is directed towards the right mainstem bronchus. Consider withdrawing by approximately 1.5-2 cm. There is a feeding tube with tip well below the level of the GE junction. Heart size and mediastinal contours are normal. Right upper lobe pulmonary nodule with spiculated margins is identified. Previously  obscured by overlying cardiac leads. This measures approximately 1.4 cm. IMPRESSION: 1. Endotracheal tube tip is directed towards the right mainstem bronchus. Consider withdrawing by approximately 1.5-2 cm. 2. Persistent right upper lobe pulmonary nodule, previously obscured by overlying cardiac leads but noted on previous thoracic MRI. Recommend further evaluation with CT of the chest. 3. These results will be called to the ordering clinician or representative by the Radiologist Assistant, and communication documented in the PACS or [employer]. Electronically Signed   By: Kerby Moors M.D.   On: 03/07/2022 18:28   DG CHEST PORT 1 VIEW  Result Date: 02/26/2022 CLINICAL DATA:  Sepsis. EXAM: PORTABLE CHEST 1 VIEW COMPARISON:  Feb 18, 2022. FINDINGS: The heart size and mediastinal contours are within normal limits. Endotracheal and nasogastric tubes are in grossly good position. Right internal jugular catheter is noted with tip in expected position of the SVC. Right lung is clear. Mild left basilar subsegmental atelectasis is noted. The visualized skeletal structures are unremarkable. IMPRESSION: Stable support apparatus. Mild left basilar subsegmental atelectasis. Electronically Signed   By: Marijo Conception M.D.   On: 02/26/2022 11:09   DG Abd Portable 1V  Result Date: 03/05/2022 CLINICAL DATA:  Feeding tube placement EXAM: PORTABLE ABDOMEN - 1 VIEW COMPARISON:  None Available. FINDINGS: Feeding tube with tip in the distal stomach at the level of the pylorus. IMPRESSION: Feeding tube in distal stomach.  Stylet removed. Electronically Signed   By: Suzy Bouchard M.D.   On: 03/05/2022 10:36   DG Abd Portable 1V  Result Date: 03/02/2022 CLINICAL DATA:  NG-tube. EXAM: PORTABLE ABDOMEN - 1 VIEW COMPARISON:  Abdominal x-ray 02/19/2022. FINDINGS: Nasogastric tube tip extends into the stomach. Distal tip is not included on this image. No dilated bowel loops. Lung bases are clear. IMPRESSION: 1.  Nasogastric tube extends into the stomach, but the distal tip is not included on this image. Electronically Signed   By: Ronney Asters M.D.   On: 03/02/2022 23:20   DG Swallowing Func-Speech Pathology  Result Date: 03/21/2022 Table formatting from the original result was not included. Images from the original result were not included. Objective Swallowing Evaluation: Type of Study: MBS-Modified Barium Swallow Study  Patient Details Name: Ezzie Senat MRN: [id number] Date of Birth: 12-07-1965 Today's Date: 03/21/2022 Time: SLP Start Time (ACUTE ONLY): 1000 -SLP Stop Time (ACUTE ONLY): 1020 SLP Time Calculation (min) (ACUTE ONLY): 20 min Past Medical History: Past Medical History: Diagnosis Date  AKI (acute kidney injury) ([place]) 02/14/2022  Aortic atherosclerosis ([place]) 02/14/2022  Class II obesity 02/14/2022  Diverticulosis 02/14/2022  Hepatic steatosis 02/14/2022  Hyperlipidemia 02/07/2016  Seasonal allergies   Swelling of lower extremity   bilateral  Varicose veins   right leg Past Surgical History: Past Surgical History: Procedure Laterality Date  LUMBAR LAMINECTOMY/DECOMPRESSION MICRODISCECTOMY N/A 03/01/2022  Procedure: Thoracic eleven - twelve Laminectomy with drainage of epidural abscess;  Surgeon: Kristeen Miss, MD;  Location: [place];  Service: Neurosurgery;  Laterality: N/A;  lymphnode drainage surgery    POSTERIOR CERVICAL FUSION/FORAMINOTOMY N/A 02/24/2022  Procedure: POSTERIOR CERVICAL LAMINECTOMY CERVICAL THREE-CERVICAL SEVEN WITH LATERAL MASS FUSION / FIXATION;  Surgeon: Eustace Moore, MD;  Location: [place];  Service: Neurosurgery;  Laterality: N/A; HPI: 56 y.o. female presents 02/14/2022 with severe back pain associated with hematuria and dysuria. Found to have UTI and  AKI. MRI demonstrated small herniated nucleus pulposus at L3-L4. 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 Cultures positive for meningitis. MRI spine demonstrates  diffuse epidural abscess with cord compression and likely signal change. Pt underwent C3-T1 decompressive laminectomy with epidural abscess evaucation along with C3-7 fusion on 5/13. MRI 5/16 extensive thoracic epidural abscess. 5/18- Pt underwent thoracolumbar laminectomy and decompression of epidural abscess. Extubated 5/19. Swallow evaluated 5/21 with recs for NPO; high aspiration risk. Reintubated 5/25; 5/25 repeat MRI with ongoing meningitis + new area of ischemia in central pons. Trach 5/26.  PMH includes obesity, HLD, diverticulosis.  Subjective: alert and following commands, trying to talk but aphonic  Recommendations for follow up therapy are one component of a multi-disciplinary discharge planning process, led by the attending physician.  Recommendations may be updated based on patient status, additional functional criteria and insurance authorization. Assessment / Plan / Recommendation   03/21/2022  10:00 AM Clinical Impressions Clinical Impression       Pt demonstrates overall adequate swallowing function. Airway protection is consistent throughout exam even while short of breath with consecutive sips of thin via straw. PMSV in place during exam; pt unable to breathe through her nose while masticating and needed rest breaks for oral breathing with bolus still in oral cavity. Cortrak also present and slightly impeding full epiglottic deflection and PES opening, resulting in mild vallecular residue with solids and pill hesitating at [facility]. Puree needed to fully clear tablet. Pt is capable of regular or soft diet and thin liquids if endurance allows it. Will initiate a ground diet at first for energy conservation and advance if tolerated. She will probably be more comfortable swallowing once cortrak is out, though adequate oral intake needs to first be established. SLP Visit Diagnosis Dysphagia, oropharyngeal phase (R13.12) Impact on safety and function Mild aspiration risk     03/21/2022  10:00 AM Treatment  Recommendations Treatment Recommendations Therapy as outlined in treatment plan below     03/21/2022  10:00 AM Prognosis Prognosis for Safe Diet Advancement Good   03/21/2022  10:00 AM Diet Recommendations SLP Diet Recommendations Dysphagia 2 (Fine chop) solids;Thin liquid Liquid Administration via Cup;Straw Medication Administration Whole meds with puree Compensations Slow rate;Small sips/bites;Follow solids with liquid Postural Changes Seated upright at 90 degrees     03/21/2022  10:00 AM Other Recommendations Oral Care Recommendations Oral care BID Other Recommendations Place PMSV during PO intake Follow Up Recommendations Skilled nursing-short term rehab (<3 hours/day) Assistance recommended at discharge Frequent or constant Supervision/Assistance Functional Status Assessment Patient has had a recent decline in their functional status and demonstrates the ability to make significant improvements in function in a reasonable and predictable amount of time.   03/21/2022  10:00 AM Frequency and Duration  Speech Therapy Frequency (ACUTE ONLY) min 2x/week Treatment Duration 2 weeks     03/21/2022  10:00 AM Oral Phase Oral Phase  [facility]    03/21/2022  10:00 AM Pharyngeal Phase Pharyngeal Phase Impaired Pharyngeal- Nectar Teaspoon WFL Pharyngeal- Nectar Straw WFL Pharyngeal- Thin Teaspoon NT Pharyngeal- Thin Cup WFL Pharyngeal- Thin Straw WFL Pharyngeal- Puree Reduced epiglottic inversion;Pharyngeal residue - valleculae Pharyngeal- Regular Reduced epiglottic inversion;Pharyngeal residue - valleculae     View : No data to display.    DeBlois, Katherene Ponto 03/21/2022, 10:44 AM                     EEG adult  Result Date: 02/19/2022 Lora Havens, MD     02/20/2022  8:00 AM Patient Name: Nalaysia Manganiello MRN: [id number] Epilepsy Attending: Lora Havens Referring Physician/Provider: Marshell Garfinkel, MD Date: 02/19/2022 Duration: 23.25 mins Patient history: 56yo female with altered mental status.  EEG to evaluate for seizure. Level of  alertness:  lethargic AEDs during EEG study: Propofol Technical aspects: This EEG study was done with scalp electrodes positioned according to the 10-20 International system of electrode placement. Electrical activity was acquired at a sampling rate of 500Hz  and reviewed with a high frequency filter of 70Hz  and a low frequency filter of 1Hz . EEG data were recorded continuously and digitally stored. Description: EEG showed continuous generalized 3-5Hz  theta-delta slowing.  Hyperventilation and photic stimulation were not performed.    ABNORMALITY - Continuous slow, generalized  IMPRESSION: This study is suggestive of moderate to severe diffuse encephalopathy, nonspecific etiology. No seizures or epileptiform discharges were seen throughout the recording. Lora Havens   DG C-Arm 1-60 Min-No Report  Result Date: 02/24/2022 Fluoroscopy was utilized by the requesting physician.  No radiographic interpretation.   ECHOCARDIOGRAM COMPLETE  Result Date: 02/27/2022    ECHOCARDIOGRAM REPORT   Patient Name:   JACIE TRISTAN Date of Exam: 02/27/2022 Medical Rec #:  [id number]      Height:       64.0 in Accession #:    [id number]     Weight:       227.7 lb Date of Birth:  Nov 08, 1965      BSA:          2.067 m Patient Age:    54 years       BP:           118/50 mmHg Patient Gender: F              HR:           85 bpm. Exam Location:  Inpatient Procedure: 2D Echo, Cardiac Doppler and Color Doppler Indications:    Fever R50.9  History:        Patient has no prior history of Echocardiogram examinations.                 Risk Factors:Dyslipidemia.  Sonographer:    Bernadene Person RDCS Referring Phys: [id number] [facility]  Sonographer Comments: Patient is morbidly obese. IMPRESSIONS  1. Left ventricular ejection fraction, by estimation, is 60 to 65%. The left ventricle has normal function. The left ventricle has no regional wall motion abnormalities. Left ventricular diastolic parameters were normal.  2. Right ventricular  systolic function is normal. The right ventricular size is normal.  3. Left atrial size was mildly dilated.  4. The mitral valve is normal in structure. Mild mitral valve regurgitation. No evidence of mitral stenosis.  5. The aortic valve is normal in structure. Aortic valve regurgitation is not visualized. No aortic stenosis is present.  6. The inferior vena cava is normal in size with greater than  50% respiratory variability, suggesting right atrial pressure of 3 mmHg. Conclusion(s)/Recommendation(s): No evidence of valvular vegetations on this transthoracic echocardiogram. Consider a transesophageal echocardiogram to exclude infective endocarditis if clinically indicated. FINDINGS  Left Ventricle: Left ventricular ejection fraction, by estimation, is 60 to 65%. The left ventricle has normal function. The left ventricle has no regional wall motion abnormalities. The left ventricular internal cavity size was normal in size. There is  no left ventricular hypertrophy. Left ventricular diastolic parameters were normal. Right Ventricle: The right ventricular size is normal. No increase in right ventricular wall thickness. Right ventricular systolic function is normal. Left Atrium: Left atrial size was mildly dilated. Right Atrium: Right atrial size was normal in size. Pericardium: There is no evidence of pericardial effusion. Mitral Valve: The mitral valve is normal in structure. Mild mitral valve regurgitation. No evidence of mitral valve stenosis. Tricuspid Valve: The tricuspid valve is normal in structure. Tricuspid valve regurgitation is not demonstrated. No evidence of tricuspid stenosis. Aortic Valve: The aortic valve is normal in structure. Aortic valve regurgitation is not visualized. No aortic stenosis is present. Pulmonic Valve: The pulmonic valve was normal in structure. Pulmonic valve regurgitation is not visualized. No evidence of pulmonic stenosis. Aorta: The aortic root is normal in size and structure.  Venous: The inferior vena cava is normal in size with greater than 50% respiratory variability, suggesting right atrial pressure of 3 mmHg. IAS/Shunts: No atrial level shunt detected by color flow Doppler.  LEFT VENTRICLE PLAX 2D LVIDd:         4.40 cm      Diastology LVIDs:         2.80 cm      LV e' medial:    8.84 cm/s LV PW:         1.10 cm      LV E/e' medial:  13.7 LV IVS:        0.70 cm      LV e' lateral:   11.50 cm/s LVOT diam:     2.00 cm      LV E/e' lateral: 10.5 LV SV:         75 LV SV Index:   36 LVOT Area:     3.14 cm  LV Volumes (MOD) LV vol d, MOD A2C: 106.0 ml LV vol d, MOD A4C: 113.0 ml LV vol s, MOD A2C: 50.3 ml LV vol s, MOD A4C: 53.2 ml LV SV MOD A2C:     55.7 ml LV SV MOD A4C:     113.0 ml LV SV MOD BP:      57.1 ml RIGHT VENTRICLE RV S prime:     14.10 cm/s TAPSE (M-mode): 2.3 cm LEFT ATRIUM             Index        RIGHT ATRIUM          Index LA diam:        3.90 cm 1.89 cm/m   RA Area:     9.57 cm LA Vol (A2C):   59.4 ml 28.73 ml/m  RA Volume:   17.20 ml 8.32 ml/m LA Vol (A4C):   53.4 ml 25.83 ml/m LA Biplane Vol: 56.4 ml 27.28 ml/m  AORTIC VALVE LVOT Vmax:   131.00 cm/s LVOT Vmean:  89.300 cm/s LVOT VTI:    0.238 m  AORTA Ao Root diam: 3.00 cm Ao Asc diam:  3.00 cm MITRAL VALVE MV Area (PHT): 3.85 cm     SHUNTS MV  Decel Time: 197 msec     Systemic VTI:  0.24 m MV E velocity: 121.00 cm/s  Systemic Diam: 2.00 cm MV A velocity: 62.40 cm/s MV E/A ratio:  1.94 Mihai Croitoru MD Electronically signed by Sanda Klein MD Signature Date/Time: 02/27/2022/3:56:50 PM    Final    [place] EKG SITE RITE  Result Date: 02/26/2022 If Site Rite image not attached, placement could not be confirmed due to current cardiac rhythm.   Microbiology: Recent Results (from the past 240 hour(s))  Culture, blood (Routine X 2) w Reflex to ID Panel     Status: None   Collection Time: 03/14/22  3:59 AM   Specimen: BLOOD RIGHT HAND  Result Value Ref Range Status   Specimen Description BLOOD RIGHT HAND  Final    Special Requests   Final    BOTTLES DRAWN AEROBIC AND ANAEROBIC Blood Culture results may not be optimal due to an excessive volume of blood received in culture bottles   Culture   Final    NO GROWTH 5 DAYS Performed at [facility], [place] [address], [place], [place] [address]    Report Status 03/19/2022 FINAL  Final  Culture, blood (Routine X 2) w Reflex to ID Panel     Status: None   Collection Time: 03/14/22  4:06 AM   Specimen: BLOOD RIGHT FOREARM  Result Value Ref Range Status   Specimen Description BLOOD RIGHT FOREARM  Final   Special Requests   Final    BOTTLES DRAWN AEROBIC AND ANAEROBIC Blood Culture results may not be optimal due to an excessive volume of blood received in culture bottles   Culture   Final    NO GROWTH 5 DAYS Performed at [facility], [place] [address], [place], [place] [address]    Report Status 03/19/2022 FINAL  Final     Labs: Basic Metabolic Panel: Recent Labs  Lab 03/17/22 0108 03/18/22 0259 03/19/22 0529 03/20/22 0321 03/20/22 1651 03/20/22 2312 03/21/22 0107 03/21/22 0328 03/21/22 0643 03/21/22 0810  NA 155* 151* 158* 153*   < > 149* 145 144 142 141  K 3.1* 4.5 3.9 4.0  --   --   --  3.5  --   --   CL 122* 119* 122* 120*  --   --   --  107  --   --   CO2 25 25 25 25   --   --   --  24  --   --   GLUCOSE 224* 137* 158* 189*  --   --   --  152*  --   --   BUN 44* 33* 32* 37*  --   --   --  25*  --   --   CREATININE 0.94 0.83 0.90 0.81  --   --   --  0.76  --   --   CALCIUM 8.2* 8.7* 9.3 8.9  --   --   --  9.0  --   --   MG 2.5* 2.5* 2.6* 2.5*  --   --   --  2.3  --   --    < > = values in this interval not displayed.   Liver Function Tests: Recent Labs  Lab 03/15/22 0302 03/16/22 0352 03/17/22 0108 03/19/22 0529  AST 23 21 23 26   ALT 15 16 17 18   ALKPHOS 117 105 95 98  BILITOT 0.6 0.4 0.3 0.5  PROT 7.6 7.3 6.7 7.7  ALBUMIN 2.4*  2.4* 2.3* 2.7*   No results for input(s): LIPASE, AMYLASE in the last 168 hours. No  results for input(s): AMMONIA in the last 168 hours. CBC: Recent Labs  Lab 03/17/22 0108 03/18/22 0259 03/19/22 0529 03/20/22 0321 03/21/22 0328  WBC 12.0* 10.1 9.9 10.3 11.2*  HGB 8.8* 9.2* 10.6* 9.5* 9.1*  HCT 30.2* 30.9* 35.0* 33.1* 31.3*  MCV 100.0 97.8 98.3 100.3* 99.4  PLT 236 254 235 197 206   Cardiac Enzymes: No results for input(s): CKTOTAL, CKMB, CKMBINDEX, TROPONINI in the last 168 hours. BNP: BNP (last 3 results) No results for input(s): BNP in the last 8760 hours.  ProBNP (last 3 results) No results for input(s): PROBNP in the last 8760 hours.  CBG: Recent Labs  Lab 03/20/22 1738 03/20/22 2003 03/20/22 2330 03/21/22 0312 03/21/22 0819  GLUCAP 140* 153* 225* 153* 83       Signed:  Nita Sells MD   [facility] 03/21/2022, 11:18 AM

## 2022-03-21 NOTE — TOC Progression Note (Signed)
Transition of Care [facility]) - Progression Note    Patient Details  Name: Gabrielle Vincent MRN: [id number] Date of Birth: 03/19/1966  Transition of Care [facility]) CM/SW [place], [place] Phone Number: 03/21/2022, 1:05 PM  Clinical Narrative:   CSW spoke with patient's sister, per request, to discuss transfer to [facility] and answer questions about patient's disability application that sister was working on. CSW updated [facility] liaison about sister's questions, and [facility] will initiate insurance authorization request. TOC to follow.    Expected Discharge Plan: Long Term Acute Care (LTAC) Barriers to Discharge: Continued Medical Work up, Ship broker  Expected Discharge Plan and Services Expected Discharge Plan: [place] (LTAC)         Expected Discharge Date: 03/21/22                                     Social Determinants of Health (SDOH) Interventions    Readmission Risk Interventions     View : No data to display.

## 2022-03-21 NOTE — TOC Transition Note (Signed)
Transition of Care [facility]) - CM/SW Discharge Note   Patient Details  Name: Gabrielle Vincent MRN: [id number] Date of Birth: 07/21/1966  [facility]) CM/SW Contact:  Geralynn Ochs, LCSW Phone Number: 03/21/2022, 1:07 PM   Clinical Narrative:   CSW notified by [facility] that patient has insurance approval, and MD cleared for discharge today. Patient has been assigned room 5E30. Receiving doc is Dr Forrestine Him.   Nurse call report to [phone] prior to transfer, patient room will be ready at 2:00 PM.     Final next level of care: Long Term Acute Care (LTAC) Barriers to Discharge: Barriers Resolved   Patient Goals and CMS Choice        Discharge Placement              Patient chooses bed at:  [facility]) Patient to be transferred to facility by: RN transfer Name of family member notified: Crystal Patient and family notified of of transfer: 03/21/22  Discharge Plan and Services                                     Social Determinants of Health (SDOH) Interventions     Readmission Risk Interventions     View : No data to display.

## 2022-03-22 ENCOUNTER — Ambulatory Visit (HOSPITAL_BASED_OUTPATIENT_CLINIC_OR_DEPARTMENT_OTHER): Payer: BC Managed Care – PPO | Admitting: Nurse Practitioner

## 2022-03-22 LAB — CBC WITH DIFFERENTIAL/PLATELET
Abs Immature Granulocytes: 0.09 10*3/uL — ABNORMAL HIGH (ref 0.00–0.07)
Basophils Absolute: 0 10*3/uL (ref 0.0–0.1)
Basophils Relative: 0 %
Eosinophils Absolute: 0.3 10*3/uL (ref 0.0–0.5)
Eosinophils Relative: 3 %
HCT: 31.7 % — ABNORMAL LOW (ref 36.0–46.0)
Hemoglobin: 10 g/dL — ABNORMAL LOW (ref 12.0–15.0)
Immature Granulocytes: 1 %
Lymphocytes Relative: 19 %
Lymphs Abs: 2 10*3/uL (ref 0.7–4.0)
MCH: 30.1 pg (ref 26.0–34.0)
MCHC: 31.5 g/dL (ref 30.0–36.0)
MCV: 95.5 fL (ref 80.0–100.0)
Monocytes Absolute: 0.5 10*3/uL (ref 0.1–1.0)
Monocytes Relative: 4 %
Neutro Abs: 8 10*3/uL — ABNORMAL HIGH (ref 1.7–7.7)
Neutrophils Relative %: 73 %
Platelets: 189 10*3/uL (ref 150–400)
RBC: 3.32 MIL/uL — ABNORMAL LOW (ref 3.87–5.11)
RDW: 19.2 % — ABNORMAL HIGH (ref 11.5–15.5)
WBC: 11 10*3/uL — ABNORMAL HIGH (ref 4.0–10.5)
nRBC: 0.2 % (ref 0.0–0.2)

## 2022-03-22 LAB — COMPREHENSIVE METABOLIC PANEL
ALT: 20 U/L (ref 0–44)
AST: 29 U/L (ref 15–41)
Albumin: 2.6 g/dL — ABNORMAL LOW (ref 3.5–5.0)
Alkaline Phosphatase: 142 U/L — ABNORMAL HIGH (ref 38–126)
Anion gap: 14 (ref 5–15)
BUN: 36 mg/dL — ABNORMAL HIGH (ref 6–20)
CO2: 22 mmol/L (ref 22–32)
Calcium: 9.3 mg/dL (ref 8.9–10.3)
Chloride: 106 mmol/L (ref 98–111)
Creatinine, Ser: 0.76 mg/dL (ref 0.44–1.00)
GFR, Estimated: >60 mL/min (ref >60)
Glucose, Bld: 173 mg/dL — ABNORMAL HIGH (ref 70–99)
Potassium: 3.6 mmol/L (ref 3.5–5.1)
Sodium: 142 mmol/L (ref 135–145)
Total Bilirubin: 0.4 mg/dL (ref 0.3–1.2)
Total Protein: 6.9 g/dL (ref 6.5–8.1)

## 2022-03-23 LAB — BASIC METABOLIC PANEL
Anion gap: 10 (ref 5–15)
BUN: 26 mg/dL — ABNORMAL HIGH (ref 6–20)
CO2: 23 mmol/L (ref 22–32)
Calcium: 8.8 mg/dL — ABNORMAL LOW (ref 8.9–10.3)
Chloride: 109 mmol/L (ref 98–111)
Creatinine, Ser: 0.67 mg/dL (ref 0.44–1.00)
GFR, Estimated: >60 mL/min (ref >60)
Glucose, Bld: 163 mg/dL — ABNORMAL HIGH (ref 70–99)
Potassium: 4 mmol/L (ref 3.5–5.1)
Sodium: 142 mmol/L (ref 135–145)

## 2022-03-23 LAB — OSMOLALITY, URINE: Osmolality, Ur: 337 mOsm/kg (ref 300–900)

## 2022-03-23 LAB — MAGNESIUM: Magnesium: 2.3 mg/dL (ref 1.7–2.4)

## 2022-03-24 LAB — MAGNESIUM: Magnesium: 2.4 mg/dL (ref 1.7–2.4)

## 2022-03-24 LAB — BASIC METABOLIC PANEL
Anion gap: 14 (ref 5–15)
BUN: 23 mg/dL — ABNORMAL HIGH (ref 6–20)
CO2: 26 mmol/L (ref 22–32)
Calcium: 9.5 mg/dL (ref 8.9–10.3)
Chloride: 106 mmol/L (ref 98–111)
Creatinine, Ser: 0.71 mg/dL (ref 0.44–1.00)
GFR, Estimated: >60 mL/min (ref >60)
Glucose, Bld: 175 mg/dL — ABNORMAL HIGH (ref 70–99)
Potassium: 4.3 mmol/L (ref 3.5–5.1)
Sodium: 146 mmol/L — ABNORMAL HIGH (ref 135–145)

## 2022-03-24 LAB — SODIUM: Sodium: 143 mmol/L (ref 135–145)

## 2022-03-26 DIAGNOSIS — G9349 Other encephalopathy: Secondary | ICD-10-CM

## 2022-03-26 DIAGNOSIS — G009 Bacterial meningitis, unspecified: Secondary | ICD-10-CM

## 2022-03-26 DIAGNOSIS — A419 Sepsis, unspecified organism: Secondary | ICD-10-CM

## 2022-03-26 DIAGNOSIS — I6312 Cerebral infarction due to embolism of basilar artery: Secondary | ICD-10-CM

## 2022-03-26 DIAGNOSIS — J9621 Acute and chronic respiratory failure with hypoxia: Secondary | ICD-10-CM | POA: Diagnosis not present

## 2022-03-26 DIAGNOSIS — R6520 Severe sepsis without septic shock: Secondary | ICD-10-CM

## 2022-03-26 LAB — CBC
HCT: 34.7 % — ABNORMAL LOW (ref 36.0–46.0)
Hemoglobin: 10.5 g/dL — ABNORMAL LOW (ref 12.0–15.0)
MCH: 29.7 pg (ref 26.0–34.0)
MCHC: 30.3 g/dL (ref 30.0–36.0)
MCV: 98 fL (ref 80.0–100.0)
Platelets: 195 10*3/uL (ref 150–400)
RBC: 3.54 MIL/uL — ABNORMAL LOW (ref 3.87–5.11)
RDW: 19.1 % — ABNORMAL HIGH (ref 11.5–15.5)
WBC: 7.9 10*3/uL (ref 4.0–10.5)
nRBC: 0.3 % — ABNORMAL HIGH (ref 0.0–0.2)

## 2022-03-26 LAB — BASIC METABOLIC PANEL
Anion gap: 11 (ref 5–15)
BUN: 26 mg/dL — ABNORMAL HIGH (ref 6–20)
CO2: 28 mmol/L (ref 22–32)
Calcium: 9.3 mg/dL (ref 8.9–10.3)
Chloride: 105 mmol/L (ref 98–111)
Creatinine, Ser: 0.7 mg/dL (ref 0.44–1.00)
GFR, Estimated: >60 mL/min (ref >60)
Glucose, Bld: 182 mg/dL — ABNORMAL HIGH (ref 70–99)
Potassium: 3.7 mmol/L (ref 3.5–5.1)
Sodium: 144 mmol/L (ref 135–145)

## 2022-03-26 LAB — MAGNESIUM: Magnesium: 2.4 mg/dL (ref 1.7–2.4)

## 2022-03-26 NOTE — Consult Note (Signed)
P[facility]  PULMONARY SERVICE  Date of Service: 03/26/2022  PULMONARY CRITICAL CARE CONSULT   Arriel Victor  ION:[id number]  DOB: 1966/09/22   DOA: 03/21/2022  Referring Physician: Satira Sark, MD  HPI: Gabrielle Vincent is a 56 y.o. female seen for follow up of Acute on Chronic Respiratory Failure.  Patient has multiple medical problems including acute renal failure hepatic disease and hyperlipidemia allergies varicose veins obesity diverticulosis came into the hospital with obstructive uropathy and fatty infiltrates and also was having severe lower back pain with hematuria.  Patient had been found to have a herniated L3-4.  The patient subsequently was seen by neurosurgery and at that time was recommended for conservative management.  Patient developed hypernatremia progressive encephalopathy was moved to the ICU started on coverage for meningitis the lumbar puncture found to have purulent CSF and was found to have gram-positive cocci.  Started on broad antibiotics.  MRI on the 25th had shown ongoing meningitis and some new areas of ischemia.  Patient was extubated failed had to be reintubated and then underwent tracheostomy on 26 May.  Transferred to our facility for further management  Review of Systems:  ROS performed and is unremarkable other than noted above.  Past Medical History:  Diagnosis Date   AKI (acute kidney injury) ([place]) 02/14/2022   Aortic atherosclerosis ([place]) 02/14/2022   Class II obesity 02/14/2022   Diverticulosis 02/14/2022   Hepatic steatosis 02/14/2022   Hyperlipidemia 02/07/2016   Seasonal allergies    Swelling of lower extremity    bilateral   Varicose veins    right leg    Past Surgical History:  Procedure Laterality Date   LUMBAR LAMINECTOMY/DECOMPRESSION MICRODISCECTOMY N/A 03/01/2022   Procedure: Thoracic eleven - twelve Laminectomy with drainage of epidural abscess;  Surgeon: Kristeen Miss, MD;  Location: [place];  Service: Neurosurgery;  Laterality: N/A;   lymphnode drainage surgery     POSTERIOR CERVICAL FUSION/FORAMINOTOMY N/A 02/24/2022   Procedure: POSTERIOR CERVICAL LAMINECTOMY CERVICAL THREE-CERVICAL SEVEN WITH LATERAL MASS FUSION / FIXATION;  Surgeon: Eustace Moore, MD;  Location: [place];  Service: Neurosurgery;  Laterality: N/A;    Social History:    reports that she has never smoked. She does not have any smokeless tobacco history on file. She reports that she does not drink alcohol and does not use drugs.  Family History: Non-Contributory to the present illness  Allergies  Allergen Reactions   Erythromycin Anaphylaxis   Penicillins Nausea Only   Prednisone Other (See Comments)    Lymph node swelling    Medications: Reviewed on Rounds  Physical Exam:  Vitals: Temperature is 97.6 pulse of 120 respiratory 26 blood pressure is 112/62 saturations 100%  Ventilator Settings T collar FiO2 is 28%  General: Comfortable at this time Eyes: Grossly normal lids, irises & conjunctiva ENT: grossly tongue is normal Neck: no obvious mass Cardiovascular: S1-S2 normal no gallop or rub Respiratory: Scattered rhonchi expansion is equal Abdomen: Soft and nontender Skin: no rash seen on limited exam Musculoskeletal: not rigid Psychiatric:unable to assess Neurologic: no seizure no involuntary movements         Labs on Admission:  Basic Metabolic Panel: Recent Labs  Lab 03/20/22 0321 03/20/22 1651 03/21/22 0328 03/21/22 2440 03/22/22 0447 03/23/22 0547 03/24/22 0239 03/24/22 2132 03/26/22 0300  NA 153*   < > 144   < > 142 142 146* 143 144  K 4.0  --  3.5  --  3.6 4.0 4.3  --  3.7  CL 120*  --  107  --  106 109 106  --  105  CO2 25  --  24  --  '22 23 26  '$ --  28  GLUCOSE 189*  --  152*  --  173* 163* 175*  --  182*  BUN 37*  --  25*  --  36* 26* 23*  --  26*  CREATININE 0.81  --  0.76  --  0.76 0.67 0.71  --  0.70  CALCIUM 8.9  --  9.0  --  9.3 8.8* 9.5  --  9.3  MG 2.5*  --   2.3  --   --  2.3 2.4  --  2.4   < > = values in this interval not displayed.    No results for input(s): "PHART", "PCO2ART", "PO2ART", "HCO3", "O2SAT" in the last 168 hours.  Liver Function Tests: Recent Labs  Lab 03/22/22 0447  AST 29  ALT 20  ALKPHOS 142*  BILITOT 0.4  PROT 6.9  ALBUMIN 2.6*   No results for input(s): "LIPASE", "AMYLASE" in the last 168 hours. No results for input(s): "AMMONIA" in the last 168 hours.  CBC: Recent Labs  Lab 03/20/22 0321 03/21/22 0328 03/22/22 0447 03/26/22 0300  WBC 10.3 11.2* 11.0* 7.9  NEUTROABS  --   --  8.0*  --   HGB 9.5* 9.1* 10.0* 10.5*  HCT 33.1* 31.3* 31.7* 34.7*  MCV 100.3* 99.4 95.5 98.0  PLT 197 206 189 195    Cardiac Enzymes: No results for input(s): "CKTOTAL", "CKMB", "CKMBINDEX", "TROPONINI" in the last 168 hours.  BNP (last 3 results) No results for input(s): "BNP" in the last 8760 hours.  ProBNP (last 3 results) No results for input(s): "PROBNP" in the last 8760 hours.   Radiological Exams on Admission: No results found.  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 Severe sepsis and shock she had complicated urinary tract infection along with meningitis.  Has been treated hemodynamics are stable Acute stroke multifactorial therapy as tolerated we will continue to monitor closely. Metabolic encephalopathy secondary to neuro issues as well as acute stroke as well as metabolic issues we will continue to follow along closely. Meningitis patient has been treated for purulent bacterial meningitis  continue with supportive care  I have personally seen and evaluated the patient, evaluated laboratory and imaging results, formulated the assessment and plan and placed orders. The Patient requires high complexity decision making with multiple systems involvement.  Case was discussed on Rounds with the Respiratory Therapy Director and the Respiratory staff Time Spent 63mnutes  Dantae Meunier A Chinonso Linker, MD FAdvanced Surgical Care Of Boerne LLCPulmonary Critical Care Medicine Sleep Medicine

## 2022-03-27 ENCOUNTER — Other Ambulatory Visit (HOSPITAL_COMMUNITY): Payer: Self-pay

## 2022-03-27 DIAGNOSIS — I6312 Cerebral infarction due to embolism of basilar artery: Secondary | ICD-10-CM | POA: Diagnosis not present

## 2022-03-27 DIAGNOSIS — J9621 Acute and chronic respiratory failure with hypoxia: Secondary | ICD-10-CM | POA: Diagnosis not present

## 2022-03-27 DIAGNOSIS — G9349 Other encephalopathy: Secondary | ICD-10-CM | POA: Diagnosis not present

## 2022-03-27 DIAGNOSIS — G009 Bacterial meningitis, unspecified: Secondary | ICD-10-CM | POA: Diagnosis not present

## 2022-03-27 LAB — BASIC METABOLIC PANEL
Anion gap: 15 (ref 5–15)
BUN: 23 mg/dL — ABNORMAL HIGH (ref 6–20)
CO2: 26 mmol/L (ref 22–32)
Calcium: 9.3 mg/dL (ref 8.9–10.3)
Chloride: 101 mmol/L (ref 98–111)
Creatinine, Ser: 0.74 mg/dL (ref 0.44–1.00)
GFR, Estimated: >60 mL/min (ref >60)
Glucose, Bld: 115 mg/dL — ABNORMAL HIGH (ref 70–99)
Potassium: 3.7 mmol/L (ref 3.5–5.1)
Sodium: 142 mmol/L (ref 135–145)

## 2022-03-27 LAB — MAGNESIUM: Magnesium: 2.4 mg/dL (ref 1.7–2.4)

## 2022-03-27 IMAGING — DX DG ABDOMEN 1V
1 series · 1 of 1 positions shown · non-contrast
Comparison: [DATE].

CLINICAL DATA: 56-year-old female NG tube placement.

EXAM:
ABDOMEN - 1 VIEW

[abdomen supine]
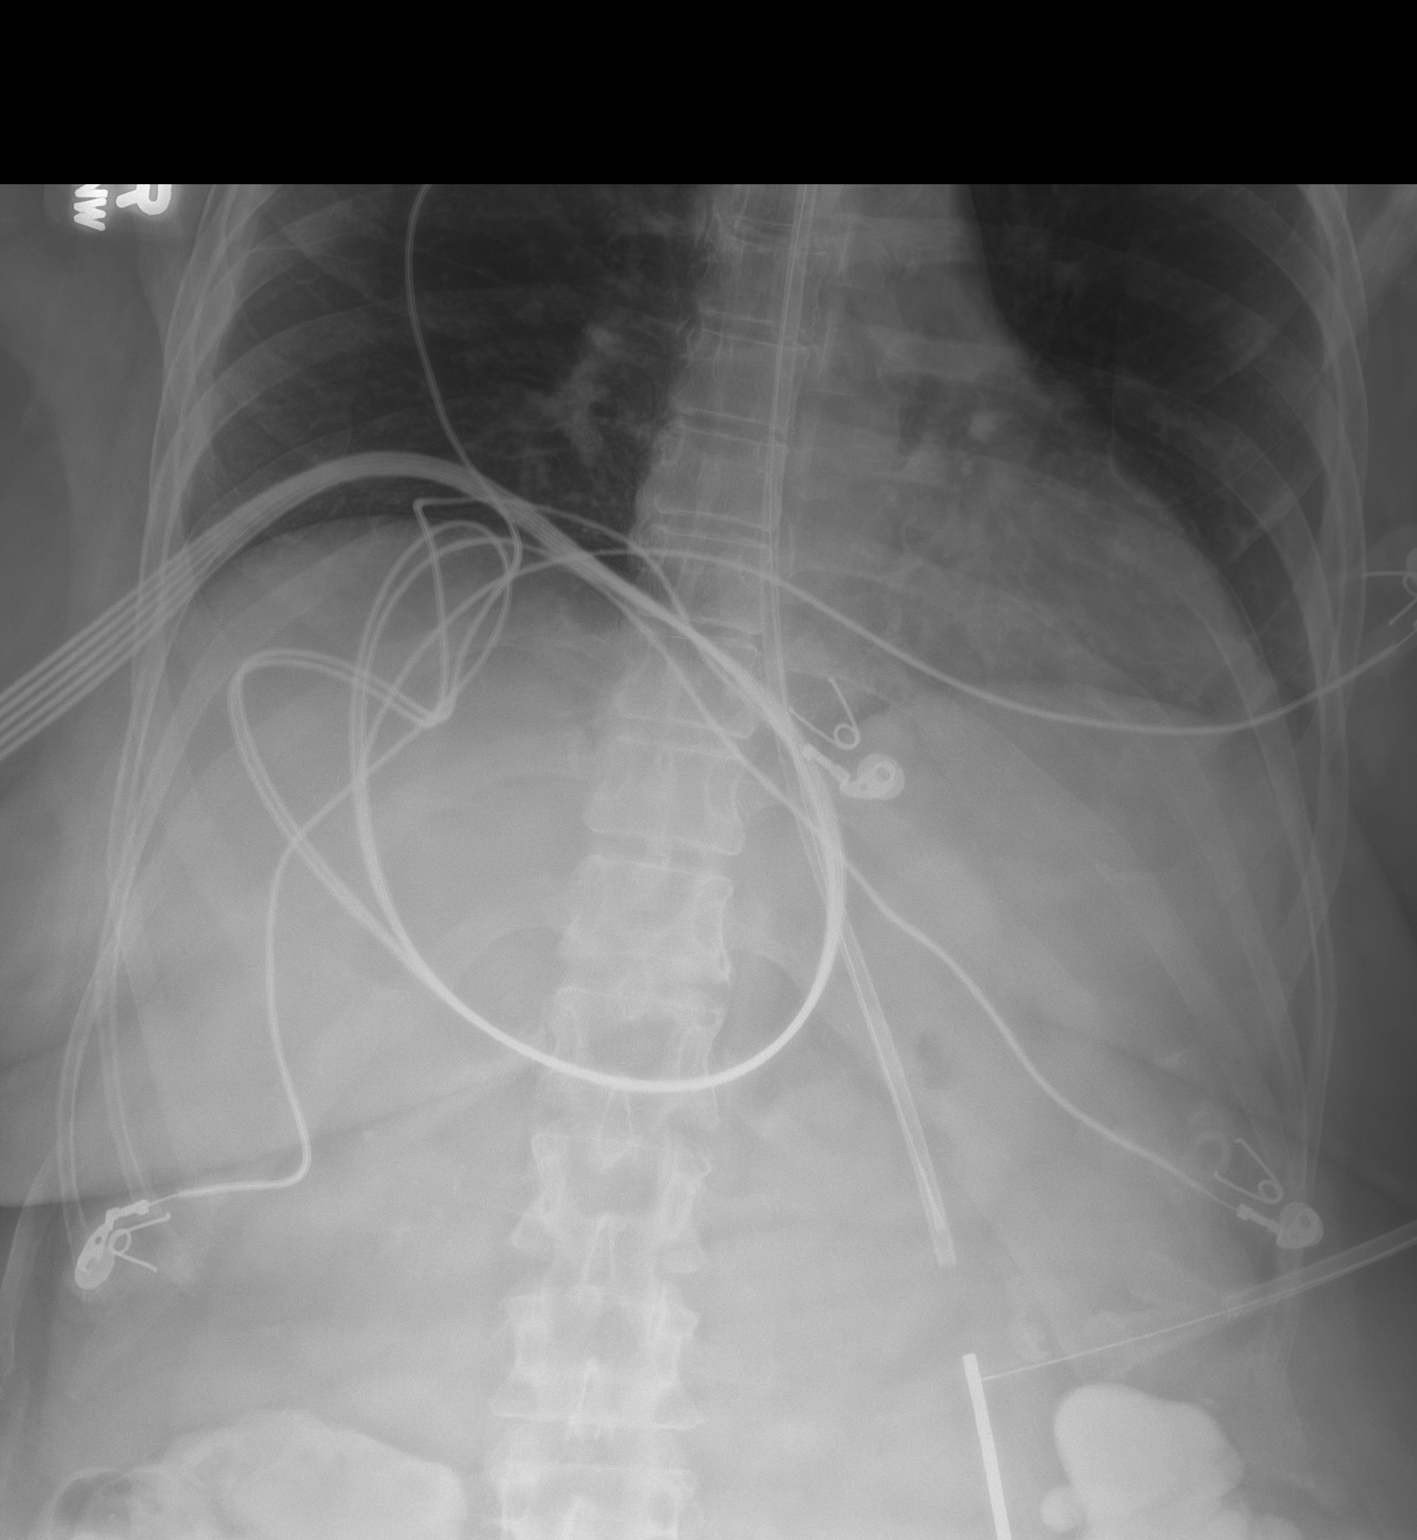

[1 of 1 positions shown; findings below may reference images not displayed]

FINDINGS: Portable AP semi upright view at [LC] hours. Enteric feeding tube
with a guidewire tracks into the left abdomen, tip mostly visible
likely at the gastric body. Some gastric ptosis as demonstrated on
[DATE]. Other lines and tubes appear to be external.

Retained oral contrast in partially visible large bowel in the lower
quadrants. Paucity of bowel gas otherwise. Negative visible lungs.
No acute osseous abnormality identified.
IMPRESSION: Enteric feeding tube tip at the gastric body.

## 2022-03-27 IMAGING — DX DG CHEST 1V PORT
1 series · 1 of 1 positions shown · non-contrast
Comparison: Abdominal radiograph this morning reported separately.
Portable chest [DATE] and earlier.

CLINICAL DATA: 56-year-old female, enteric tube placement.

EXAM:
PORTABLE CHEST 1 VIEW

[chest ap]
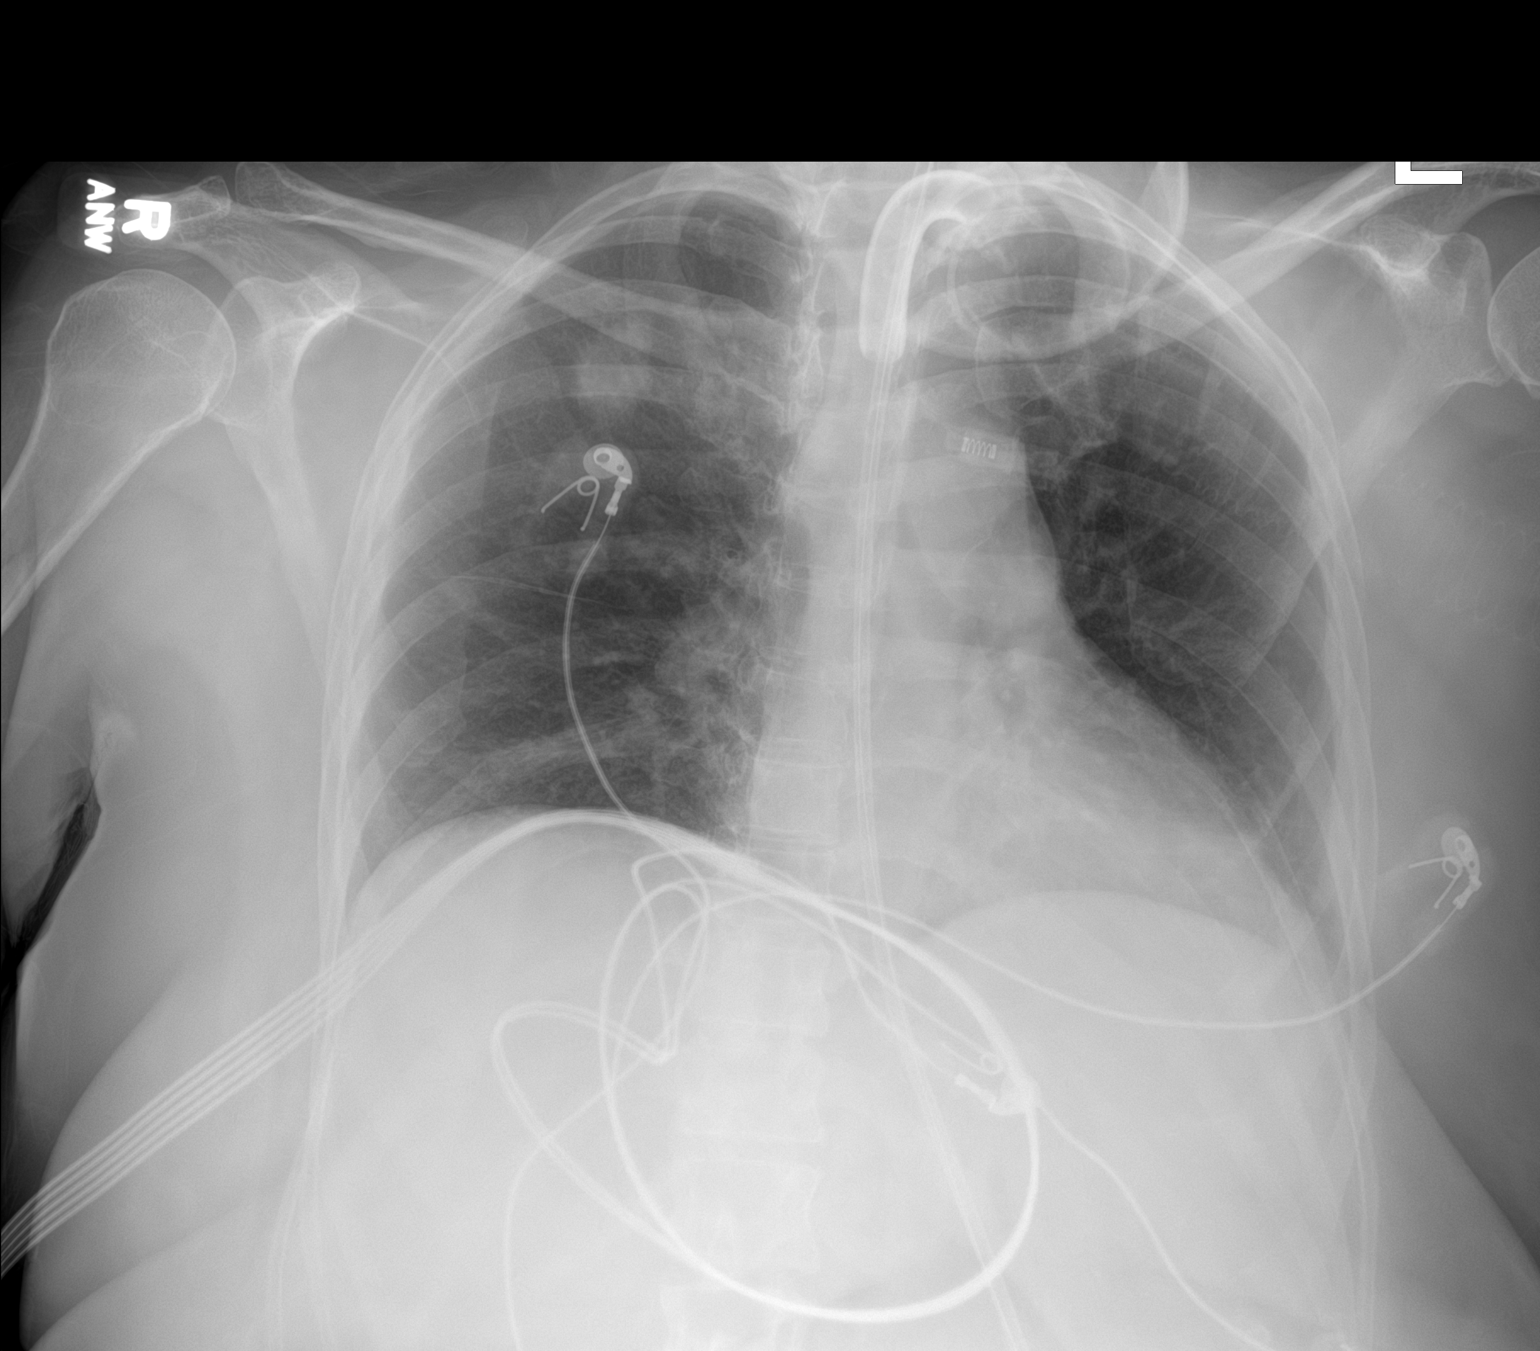

[1 of 1 positions shown; findings below may reference images not displayed]

FINDINGS: Portable AP semi upright view at [XE] hours. Mildly rotated to the
left now. Stable tracheostomy. Enteric feeding tube courses to the
abdomen, see comparison abdomen film today.

Mildly improved lung volumes and lower lung ventilation with
decreased but not resolved platelike bilateral lower lung opacity.
Right upper lung nodule appears unchanged. No pneumothorax,
pulmonary edema, or new pulmonary opacity.

Mediastinal contours remain within normal limits.

Posterior cervical spine fusion hardware. No acute osseous
abnormality identified.
IMPRESSION: 1. Stable tracheostomy. Enteric feeding tube courses to the abdomen,
see portable abdomen x-ray this morning.
2. Right lung nodule and platelike lower lung atelectasis or
scarring. No new cardiopulmonary abnormality.

## 2022-03-27 NOTE — Progress Notes (Signed)
Pulmonary Critical Care Medicine [place]   PULMONARY CRITICAL CARE SERVICE  PROGRESS NOTE     Elvena Oyer  BTD:[id number]  DOB: 19-Aug-1966   DOA: 03/21/2022  Referring Physician: Satira Sark, MD  HPI: Gabrielle Vincent is a 56 y.o. female being followed for ventilator/airway/oxygen weaning Acute on Chronic Respiratory Failure.  Patient's been weaning on the T-piece on 28% FiO2 has been tolerating PMV  Medications: Reviewed on Rounds  Physical Exam:  Vitals: Temperature is 98.5 pulse 126 respiratory 18 blood pressure is 107/58 saturations 98%  Ventilator Settings T-piece FiO2 28%  General: Comfortable at this time Neck: supple Cardiovascular: no malignant arrhythmias Respiratory: No rhonchi no rales are noted at this time Skin: no rash seen on limited exam Musculoskeletal: No gross abnormality Psychiatric:unable to assess Neurologic:no involuntary movements         Lab Data:   Basic Metabolic Panel: Recent Labs  Lab 03/21/22 0328 03/21/22 0643 03/22/22 0447 03/23/22 0547 03/24/22 0239 03/24/22 2132 03/26/22 0300 03/27/22 0330  NA 144   < > 142 142 146* 143 144 142  K 3.5  --  3.6 4.0 4.3  --  3.7 3.7  CL 107  --  106 109 106  --  105 101  CO2 24  --  '22 23 26  '$ --  28 26  GLUCOSE 152*  --  173* 163* 175*  --  182* 115*  BUN 25*  --  36* 26* 23*  --  26* 23*  CREATININE 0.76  --  0.76 0.67 0.71  --  0.70 0.74  CALCIUM 9.0  --  9.3 8.8* 9.5  --  9.3 9.3  MG 2.3  --   --  2.3 2.4  --  2.4 2.4   < > = values in this interval not displayed.    ABG: No results for input(s): "PHART", "PCO2ART", "PO2ART", "HCO3", "O2SAT" in the last 168 hours.  Liver Function Tests: Recent Labs  Lab 03/22/22 0447  AST 29  ALT 20  ALKPHOS 142*  BILITOT 0.4  PROT 6.9  ALBUMIN 2.6*   No results for input(s): "LIPASE", "AMYLASE" in the last 168 hours. No results for input(s): "AMMONIA" in the last 168 hours.  CBC: Recent Labs  Lab  03/21/22 0328 03/22/22 0447 03/26/22 0300  WBC 11.2* 11.0* 7.9  NEUTROABS  --  8.0*  --   HGB 9.1* 10.0* 10.5*  HCT 31.3* 31.7* 34.7*  MCV 99.4 95.5 98.0  PLT 206 189 195    Cardiac Enzymes: No results for input(s): "CKTOTAL", "CKMB", "CKMBINDEX", "TROPONINI" in the last 168 hours.  BNP (last 3 results) No results for input(s): "BNP" in the last 8760 hours.  ProBNP (last 3 results) No results for input(s): "PROBNP" in the last 8760 hours.  Radiological Exams: DG Chest Port 1 View  Result Date: 03/27/2022 CLINICAL DATA:  56 year old female, enteric tube placement. EXAM: PORTABLE CHEST 1 VIEW COMPARISON:  Abdominal radiograph this morning reported separately. Portable chest 03/21/2022 and earlier. FINDINGS: Portable AP semi upright view at 0458 hours. Mildly rotated to the left now. Stable tracheostomy. Enteric feeding tube courses to the abdomen, see comparison abdomen film today. Mildly improved lung volumes and lower lung ventilation with decreased but not resolved platelike bilateral lower lung opacity. Right upper lung nodule appears unchanged. No pneumothorax, pulmonary edema, or new pulmonary opacity. Mediastinal contours remain within normal limits. Posterior cervical spine fusion hardware. No acute osseous abnormality identified. IMPRESSION: 1. Stable tracheostomy. Enteric feeding tube courses to  the abdomen, see portable abdomen x-ray this morning. 2. Right lung nodule and platelike lower lung atelectasis or scarring. No new cardiopulmonary abnormality. Electronically Signed   By: Genevie Ann M.D.   On: 03/27/2022 05:50   DG Abd 1 View  Result Date: 03/27/2022 CLINICAL DATA:  56 year old female NG tube placement. EXAM: ABDOMEN - 1 VIEW COMPARISON:  03/21/2022. FINDINGS: Portable AP semi upright view at 0500 hours. Enteric feeding tube with a guidewire tracks into the left abdomen, tip mostly visible likely at the gastric body. Some gastric ptosis as demonstrated on 03/21/2022. Other  lines and tubes appear to be external. Retained oral contrast in partially visible large bowel in the lower quadrants. Paucity of bowel gas otherwise. Negative visible lungs. No acute osseous abnormality identified. IMPRESSION: Enteric feeding tube tip at the gastric body. Electronically Signed   By: Genevie Ann M.D.   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 The Patient requires high complexity decision making with multiple systems involvement.  Rounds were done with the Respiratory Therapy Director and Staff therapists and discussed with nursing staff also.  Allyne Gee, MD [facility] Pulmonary Critical Care Medicine Sleep Medicine

## 2022-03-28 DIAGNOSIS — I6312 Cerebral infarction due to embolism of basilar artery: Secondary | ICD-10-CM | POA: Diagnosis not present

## 2022-03-28 DIAGNOSIS — G9349 Other encephalopathy: Secondary | ICD-10-CM | POA: Diagnosis not present

## 2022-03-28 DIAGNOSIS — J9621 Acute and chronic respiratory failure with hypoxia: Secondary | ICD-10-CM | POA: Diagnosis not present

## 2022-03-28 DIAGNOSIS — G009 Bacterial meningitis, unspecified: Secondary | ICD-10-CM | POA: Diagnosis not present

## 2022-03-28 LAB — TSH: TSH: 6.315 u[IU]/mL — ABNORMAL HIGH (ref 0.350–4.500)

## 2022-03-28 LAB — T4, FREE: Free T4: 0.96 ng/dL (ref 0.61–1.12)

## 2022-03-28 NOTE — Progress Notes (Signed)
Pulmonary Critical Care Medicine [place]   PULMONARY CRITICAL CARE SERVICE  PROGRESS NOTE     Gabrielle Vincent  XTK:[id number]  DOB: 23-Jun-1966   DOA: 03/21/2022  Referring Physician: Satira Sark, MD  HPI: Gabrielle Vincent is a 56 y.o. female being followed for ventilator/airway/oxygen weaning Acute on Chronic Respiratory Failure.  Patient is on T collar supposed to have started.  Not done yet  Medications: Reviewed on Rounds  Physical Exam:  Vitals: Temperature is 99.2 pulse of 112 respiratory 27 blood pressure is 115/64 saturations 97%  Ventilator Settings T collar with PMV  General: Comfortable at this time Neck: supple Cardiovascular: no malignant arrhythmias Respiratory: Coarse rhonchi expansion is equal Skin: no rash seen on limited exam Musculoskeletal: No gross abnormality Psychiatric:unable to assess Neurologic:no involuntary movements         Lab Data:   Basic Metabolic Panel: Recent Labs  Lab 03/22/22 0447 03/23/22 0547 03/24/22 0239 03/24/22 2132 03/26/22 0300 03/27/22 0330  NA 142 142 146* 143 144 142  K 3.6 4.0 4.3  --  3.7 3.7  CL 106 109 106  --  105 101  CO2 '22 23 26  '$ --  28 26  GLUCOSE 173* 163* 175*  --  182* 115*  BUN 36* 26* 23*  --  26* 23*  CREATININE 0.76 0.67 0.71  --  0.70 0.74  CALCIUM 9.3 8.8* 9.5  --  9.3 9.3  MG  --  2.3 2.4  --  2.4 2.4    ABG: No results for input(s): "PHART", "PCO2ART", "PO2ART", "HCO3", "O2SAT" in the last 168 hours.  Liver Function Tests: Recent Labs  Lab 03/22/22 0447  AST 29  ALT 20  ALKPHOS 142*  BILITOT 0.4  PROT 6.9  ALBUMIN 2.6*   No results for input(s): "LIPASE", "AMYLASE" in the last 168 hours. No results for input(s): "AMMONIA" in the last 168 hours.  CBC: Recent Labs  Lab 03/22/22 0447 03/26/22 0300  WBC 11.0* 7.9  NEUTROABS 8.0*  --   HGB 10.0* 10.5*  HCT 31.7* 34.7*  MCV 95.5 98.0  PLT 189 195    Cardiac Enzymes: No results for input(s):  "CKTOTAL", "CKMB", "CKMBINDEX", "TROPONINI" in the last 168 hours.  BNP (last 3 results) No results for input(s): "BNP" in the last 8760 hours.  ProBNP (last 3 results) No results for input(s): "PROBNP" in the last 8760 hours.  Radiological Exams: DG Chest Port 1 View  Result Date: 03/27/2022 CLINICAL DATA:  56 year old female, enteric tube placement. EXAM: PORTABLE CHEST 1 VIEW COMPARISON:  Abdominal radiograph this morning reported separately. Portable chest 03/21/2022 and earlier. FINDINGS: Portable AP semi upright view at 0458 hours. Mildly rotated to the left now. Stable tracheostomy. Enteric feeding tube courses to the abdomen, see comparison abdomen film today. Mildly improved lung volumes and lower lung ventilation with decreased but not resolved platelike bilateral lower lung opacity. Right upper lung nodule appears unchanged. No pneumothorax, pulmonary edema, or new pulmonary opacity. Mediastinal contours remain within normal limits. Posterior cervical spine fusion hardware. No acute osseous abnormality identified. IMPRESSION: 1. Stable tracheostomy. Enteric feeding tube courses to the abdomen, see portable abdomen x-ray this morning. 2. Right lung nodule and platelike lower lung atelectasis or scarring. No new cardiopulmonary abnormality. Electronically Signed   By: Genevie Ann M.D.   On: 03/27/2022 05:50   DG Abd 1 View  Result Date: 03/27/2022 CLINICAL DATA:  56 year old female NG tube placement. EXAM: ABDOMEN - 1 VIEW COMPARISON:  03/21/2022. FINDINGS:  Portable AP semi upright view at 0500 hours. Enteric feeding tube with a guidewire tracks into the left abdomen, tip mostly visible likely at the gastric body. Some gastric ptosis as demonstrated on 03/21/2022. Other lines and tubes appear to be external. Retained oral contrast in partially visible large bowel in the lower quadrants. Paucity of bowel gas otherwise. Negative visible lungs. No acute osseous abnormality identified. IMPRESSION:  Enteric feeding tube tip at the gastric body. Electronically Signed   By: Genevie Ann M.D.   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 The Patient requires high complexity decision making with multiple systems involvement.  Rounds were done with the Respiratory Therapy Director and Staff therapists and discussed with nursing staff also.  Allyne Gee, MD [facility] Pulmonary Critical Care Medicine Sleep Medicine

## 2022-03-29 ENCOUNTER — Other Ambulatory Visit (HOSPITAL_COMMUNITY): Payer: Self-pay

## 2022-03-29 LAB — RENAL FUNCTION PANEL
Albumin: 2.4 g/dL — ABNORMAL LOW (ref 3.5–5.0)
Anion gap: 10 (ref 5–15)
BUN: 16 mg/dL (ref 6–20)
CO2: 25 mmol/L (ref 22–32)
Calcium: 8.6 mg/dL — ABNORMAL LOW (ref 8.9–10.3)
Chloride: 101 mmol/L (ref 98–111)
Creatinine, Ser: 0.6 mg/dL (ref 0.44–1.00)
GFR, Estimated: >60 mL/min (ref >60)
Glucose, Bld: 143 mg/dL — ABNORMAL HIGH (ref 70–99)
Phosphorus: 3.5 mg/dL (ref 2.5–4.6)
Potassium: 3.1 mmol/L — ABNORMAL LOW (ref 3.5–5.1)
Sodium: 136 mmol/L (ref 135–145)

## 2022-03-29 LAB — CBC
HCT: 29.6 % — ABNORMAL LOW (ref 36.0–46.0)
Hemoglobin: 9.1 g/dL — ABNORMAL LOW (ref 12.0–15.0)
MCH: 29.3 pg (ref 26.0–34.0)
MCHC: 30.7 g/dL (ref 30.0–36.0)
MCV: 95.2 fL (ref 80.0–100.0)
Platelets: 221 10*3/uL (ref 150–400)
RBC: 3.11 MIL/uL — ABNORMAL LOW (ref 3.87–5.11)
RDW: 18.8 % — ABNORMAL HIGH (ref 11.5–15.5)
WBC: 6.7 10*3/uL (ref 4.0–10.5)
nRBC: 0.6 % — ABNORMAL HIGH (ref 0.0–0.2)

## 2022-03-29 LAB — MAGNESIUM: Magnesium: 2.1 mg/dL (ref 1.7–2.4)

## 2022-03-29 IMAGING — DX DG CHEST 1V PORT
1 series · 1 of 1 positions shown · non-contrast
Comparison: Radiograph [DATE], CT [DATE]

CLINICAL DATA: Fever and tachycardia.

EXAM:
PORTABLE CHEST 1 VIEW

[chest]
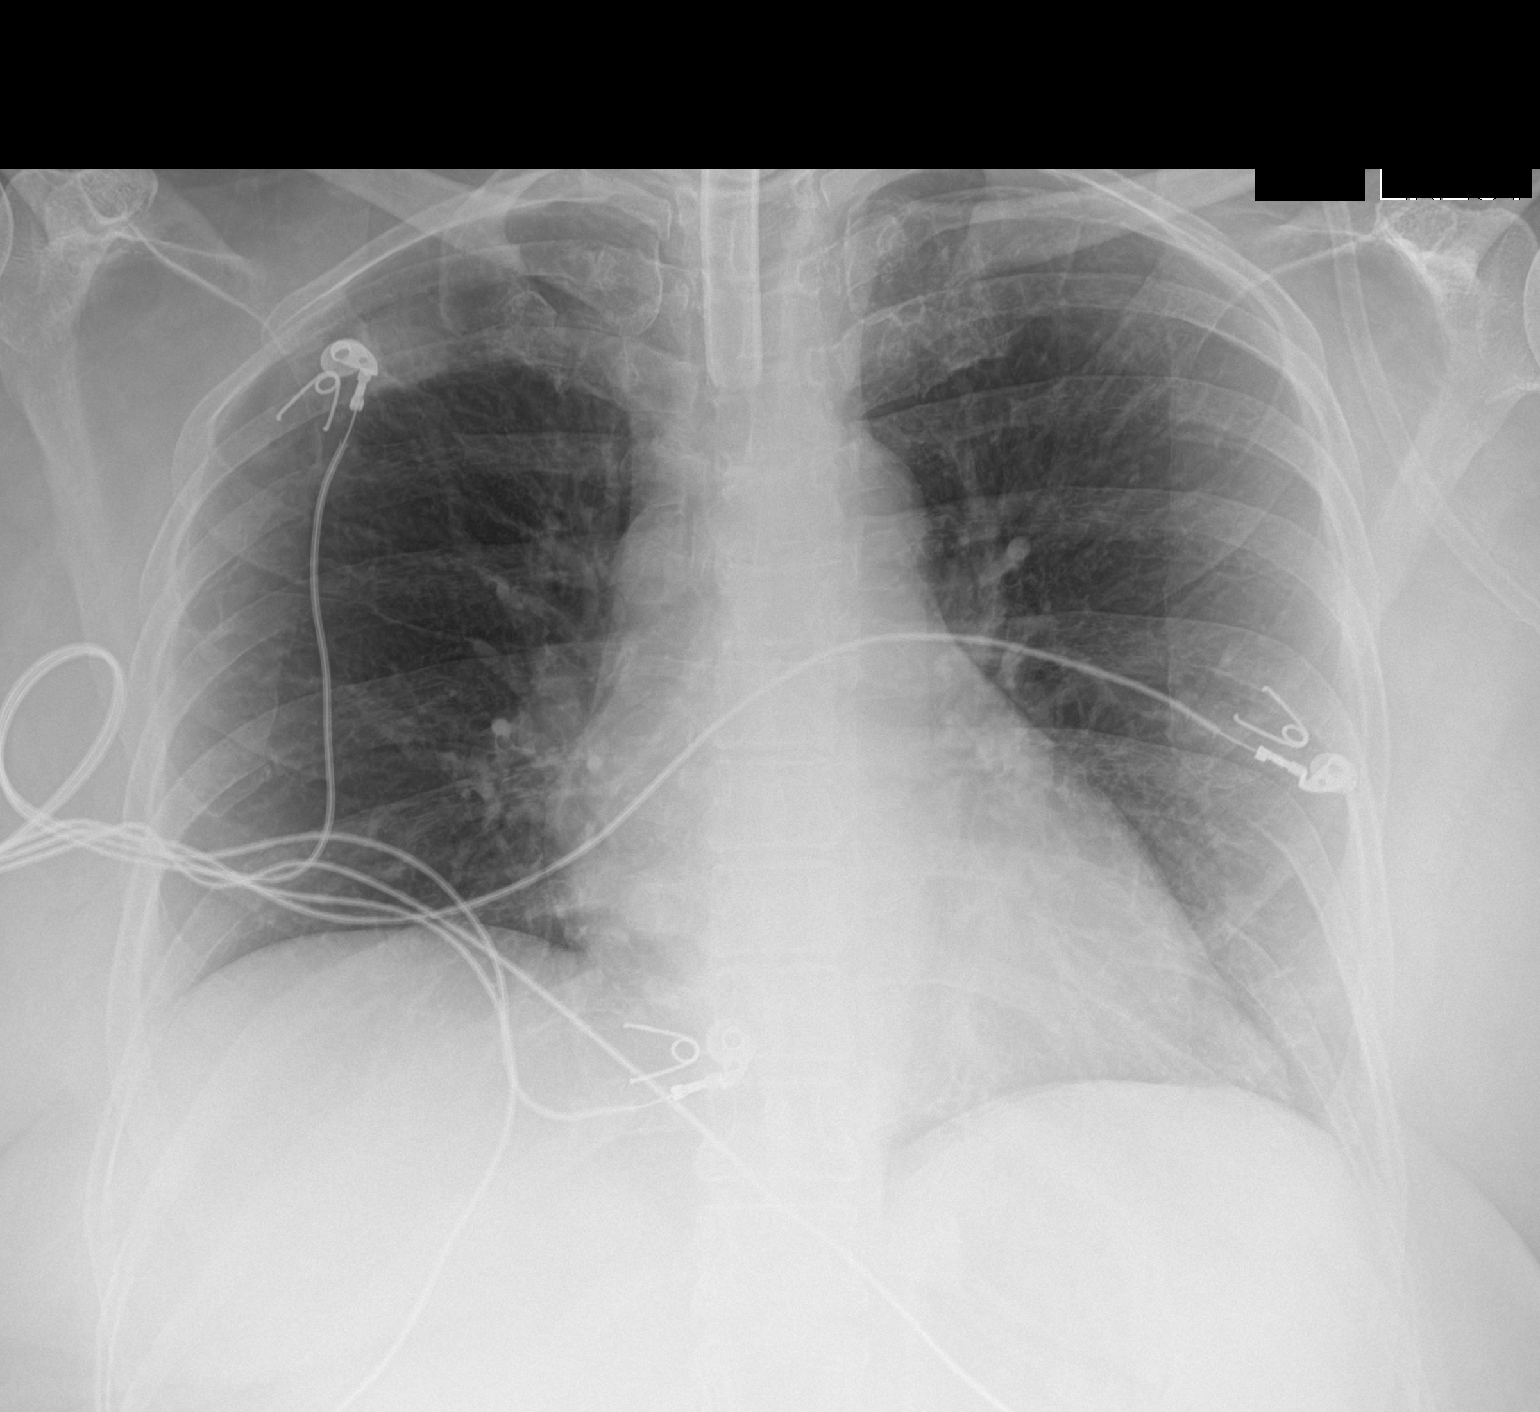

[1 of 1 positions shown; findings below may reference images not displayed]

FINDINGS: Tracheostomy tube tip projects over the thoracic inlet. Right upper
lobe pulmonary nodule better seen on prior chest CT. There is an
overlying monitoring device. The right-sided pigtail catheter has
been removed. Enteric tube has been removed. No visualized
pneumothorax. No acute or consolidative airspace disease. No
pulmonary edema or pleural effusion.
IMPRESSION: 1. No acute findings.
2. Tracheostomy tube in place.
3. Right upper lobe pulmonary nodule better seen on prior chest CT.
4. No visualized pneumothorax after removal of right pigtail
catheter.

## 2022-03-29 NOTE — Consult Note (Signed)
Referring Physician: Gladys Damme, PA.  Gabrielle Vincent is an 56 y.o. female.                       Chief Complaint: Tachycardia  HPI: 56 years old white female with PMH of acute kidney injury, aortic atherosclerosis, Hepatic steatosis, HLD, has epidural abscess, urosepsis and central diabetes insipidus treated withDDAVP. She has sinus tachycardia without chest pain or dizziness.  Past Medical History:  Diagnosis Date   AKI (acute kidney injury) ([place]) 02/14/2022   Aortic atherosclerosis ([place]) 02/14/2022   Class II obesity 02/14/2022   Diverticulosis 02/14/2022   Hepatic steatosis 02/14/2022   Hyperlipidemia 02/07/2016   Seasonal allergies    Swelling of lower extremity    bilateral   Varicose veins    right leg      Past Surgical History:  Procedure Laterality Date   LUMBAR LAMINECTOMY/DECOMPRESSION MICRODISCECTOMY N/A 03/01/2022   Procedure: Thoracic eleven - twelve Laminectomy with drainage of epidural abscess;  Surgeon: Kristeen Miss, MD;  Location: [facility];  Service: Neurosurgery;  Laterality: N/A;   lymphnode drainage surgery     POSTERIOR CERVICAL FUSION/FORAMINOTOMY N/A 02/24/2022   Procedure: POSTERIOR CERVICAL LAMINECTOMY CERVICAL THREE-CERVICAL SEVEN WITH LATERAL MASS FUSION / FIXATION;  Surgeon: Eustace Moore, MD;  Location: [facility];  Service: Neurosurgery;  Laterality: N/A;    No family history on file. Social History:  reports that she has never smoked. She does not have any smokeless tobacco history on file. She reports that she does not drink alcohol and does not use drugs.  Allergies:  Allergies  Allergen Reactions   Erythromycin Anaphylaxis   Penicillins Nausea Only   Prednisone Other (See Comments)    Lymph node swelling    Medications Prior to Admission  Medication Sig Dispense Refill   acetaminophen (TYLENOL) 160 MG/5ML solution Place 20.3 mLs (650 mg total) into feeding tube every 4 (four) hours as needed for mild pain ((score 1 to 3) or temp > 100.5). 120 mL 0    cefTRIAXone (ROCEPHIN) IVPB Inject 2 g into the vein every 12 (twelve) hours. Indication:  Disseminated E.coli Infection First Dose: Yes Last Day of Therapy:  05/03/22 Labs - Once weekly:  CBC/D and BMP, Labs - Every other week:  ESR and CRP Method of administration: IV Push Method of administration may be changed at the discretion of home infusion pharmacist based upon assessment of the patient and/or caregiver's ability to self-administer the medication ordered. 86 Units 0   desmopressin (DDAVP) 4 MCG/ML injection Inject 0.13 mLs (0.52 mcg total) into the vein 2 (two) times daily. 1 mL    docusate (COLACE) 50 MG/5ML liquid Place 10 mLs (100 mg total) into feeding tube 2 (two) times daily as needed for mild constipation. 100 mL 0   fentaNYL (SUBLIMAZE) 50 MCG/ML injection Inject 1 mL (50 mcg total) into the vein every 3 (three) hours as needed for severe pain.  0   insulin lispro (HUMALOG) 100 UNIT/ML injection Inject 0.08 mLs (8 Units total) into the skin every 4 (four) hours. 10 mL 11   insulin glargine-yfgn (SEMGLEE) 100 UNIT/ML injection Inject 0.25 mLs (25 Units total) into the skin 2 (two) times daily. 10 mL 11   midodrine (PROAMATINE) 5 MG tablet Place 1 tablet (5 mg total) into feeding tube every 8 (eight) hours.     nutrition supplement, JUVEN, (JUVEN) PACK Place 1 packet into feeding tube 2 (two) times daily between meals.  0  Nutritional Supplements (FEEDING SUPPLEMENT, JEVITY 1.5 CAL/FIBER,) LIQD Place 237 mLs into feeding tube 3 (three) times daily with meals.     Nutritional Supplements (FEEDING SUPPLEMENT, JEVITY 1.5 CAL/FIBER,) LIQD Place 474 mLs into feeding tube daily.     Nutritional Supplements (FEEDING SUPPLEMENT, PROSOURCE TF,) liquid Place 90 mLs into feeding tube 2 (two) times daily.     Nystatin (GERHARDT'S BUTT CREAM) CREA Apply 1 application. topically 2 (two) times daily.     oxyCODONE (OXY IR/ROXICODONE) 5 MG immediate release tablet Place 1 tablet (5 mg total) into  feeding tube every 6 (six) hours as needed for moderate pain. 30 tablet 0   polyethylene glycol (MIRALAX / GLYCOLAX) 17 g packet Place 17 g into feeding tube daily as needed for moderate constipation. 14 each 0   senna (SENOKOT) 8.6 MG TABS tablet Place 1 tablet (8.6 mg total) into feeding tube daily as needed for mild constipation. 120 tablet 0   Water For Irrigation, Sterile (FREE WATER) SOLN Place 200 mLs into feeding tube every 4 (four) hours.     Zinc Oxide (TRIPLE PASTE) 12.8 % ointment Apply topically as needed for irritation. 56.7 g 0    Results for orders placed or performed during the hospital encounter of 03/21/22 (from the past 48 hour(s))  T4, free     Status: None   Collection Time: 03/28/22  3:12 PM  Result Value Ref Range   Free T4 0.96 0.61 - 1.12 ng/dL    Comment: (NOTE) Biotin ingestion may interfere with free T4 tests. If the results are inconsistent with the TSH level, previous test results, or the clinical presentation, then consider biotin interference. If needed, order repeat testing after stopping biotin. Performed at [facility], [place] [address], [place], [place] [address]   TSH     Status: Abnormal   Collection Time: 03/28/22  3:12 PM  Result Value Ref Range   TSH 6.315 (H) 0.350 - 4.500 uIU/mL    Comment: Performed by a 3rd Generation assay with a functional sensitivity of <=0.01 uIU/mL. Performed at [facility], [place] [address], [place], [place] [address]   CBC     Status: Abnormal   Collection Time: 03/29/22  4:44 AM  Result Value Ref Range   WBC 6.7 4.0 - 10.5 K/uL   RBC 3.11 (L) 3.87 - 5.11 MIL/uL   Hemoglobin 9.1 (L) 12.0 - 15.0 g/dL   HCT 29.6 (L) 36.0 - 46.0 %   MCV 95.2 80.0 - 100.0 fL   MCH 29.3 26.0 - 34.0 pg   MCHC 30.7 30.0 - 36.0 g/dL   RDW 18.8 (H) 11.5 - 15.5 %   Platelets 221 150 - 400 K/uL   nRBC 0.6 (H) 0.0 - 0.2 %    Comment: Performed at [facility] [address], [place], [place] [address]  Renal function  panel     Status: Abnormal   Collection Time: 03/29/22  4:44 AM  Result Value Ref Range   Sodium 136 135 - 145 mmol/L   Potassium 3.1 (L) 3.5 - 5.1 mmol/L   Chloride 101 98 - 111 mmol/L   CO2 25 22 - 32 mmol/L   Glucose, Bld 143 (H) 70 - 99 mg/dL    Comment: Glucose reference range applies only to samples taken after fasting for at least 8 hours.   BUN 16 6 - 20 mg/dL   Creatinine, Ser 0.60 0.44 - 1.00 mg/dL   Calcium 8.6 (L) 8.9 -  10.3 mg/dL   Phosphorus 3.5 2.5 - 4.6 mg/dL   Albumin 2.4 (L) 3.5 - 5.0 g/dL   GFR, Estimated >60 >60 mL/min    Comment: (NOTE) Calculated using the CKD-EPI Creatinine Equation (2021)    Anion gap 10 5 - 15    Comment: Performed at [facility] [address] [place], [place] [address]  Magnesium     Status: None   Collection Time: 03/29/22  4:44 AM  Result Value Ref Range   Magnesium 2.1 1.7 - 2.4 mg/dL    Comment: Performed at [facility] [address], [place], [place] [address]   No results found.  Review Of Systems Constitutional: Positive fever, chills, weight loss. Eyes: No vision change, wears reading glasses. No discharge or pain. Ears: No hearing loss, No tinnitus. Respiratory: No asthma, COPD, pneumonias. No shortness of breath. No hemoptysis. Cardiovascular: No chest pain, positive palpitation and leg edema. Gastrointestinal: No nausea, vomiting, diarrhea, constipation. No GI bleed. No hepatitis. Genitourinary: No dysuria, hematuria, kidney stone. No incontinance. Neurological: No headache, stroke, seizures.  Psychiatry: No psych facility admission for anxiety, depression, suicide. No detox. Skin: No rash. Musculoskeletal: No joint pain, fibromyalgia. No neck pain, back pain. Lymphadenopathy: No lymphadenopathy. Hematology: No anemia or easy bruising.   P: 122, RR: 20 on 28 % FiO2, 100 % saturation, BP : 125/77. There is no height or weight on file to calculate BMI. General appearance: alert, cooperative, appears  stated age and mild distress Head: Normocephalic, atraumatic. Eyes: Blue eyes, pink conjunctiva, corneas clear.  Neck: No adenopathy, no carotid bruit, no JVD, supple, symmetrical, trachea midline and thyroid not enlarged. Resp: Clear to auscultation bilaterally. Cardio: Regular rate and rhythm, S1, S2 normal, II/VI systolic murmur, no click, rub or gallop GI: Soft, non-tender; bowel sounds normal; no organomegaly. Extremities: 1 + lower leg edema, no cyanosis or clubbing. Skin: Warm and dry.  Neurologic: Alert and oriented X 3, normal strength.   Assessment/Plan Sinus tachycardia S/P sepsis/Epidural abscess Dehydration Urosepsis Epidural abscess Central diabetes Insipidus  Plan: Agree with small dose metoprolol and increase dose as tolerated. Check TSH and EKG.  Time spent: Review of old records, Lab, x-rays, EKG, other cardiac tests, examination, discussion with patient/Nurse/PA over 70 minutes.  Birdie Riddle, MD  03/29/2022, 7:28 PM

## 2022-03-29 NOTE — Consult Note (Signed)
Ref: Rae Halsted, MD   Subjective:  Awake. Improving heart rate.  Objective:  Vital Signs in the last 24 hours:  P: 103, R: 31, BP: 135/57, O2 sat 100 % and FiO2 is 28 %.  Physical Exam: BP Readings from Last 1 Encounters:  03/21/22 128/71     Wt Readings from Last 1 Encounters:  03/20/22 122.5 kg    Weight change:  There is no height or weight on file to calculate BMI. HEENT: [place]/AT, Eyes-Blue, Conjunctiva-Pale pink, Sclera-Non-icteric Neck: No JVD, No bruit, Trachea midline. Lungs:  Clearing, Bilateral. Cardiac:  Regular rhythm, normal S1 and S2, no S3. II/VI systolic murmur. Abdomen:  Soft, non-tender. BS present. Extremities:  1 + edema present. No cyanosis. No clubbing. CNS: AxOx3, Cranial nerves grossly intact, moves all 4 extremities.  Skin: Warm and dry.   Intake/Output from previous day: No intake/output data recorded.    Lab Results: BMET    Component Value Date/Time   NA 136 03/29/2022 0444   NA 142 03/27/2022 0330   NA 144 03/26/2022 0300   K 3.1 (L) 03/29/2022 0444   K 3.7 03/27/2022 0330   K 3.7 03/26/2022 0300   CL 101 03/29/2022 0444   CL 101 03/27/2022 0330   CL 105 03/26/2022 0300   CO2 25 03/29/2022 0444   CO2 26 03/27/2022 0330   CO2 28 03/26/2022 0300   GLUCOSE 143 (H) 03/29/2022 0444   GLUCOSE 115 (H) 03/27/2022 0330   GLUCOSE 182 (H) 03/26/2022 0300   BUN 16 03/29/2022 0444   BUN 23 (H) 03/27/2022 0330   BUN 26 (H) 03/26/2022 0300   CREATININE 0.60 03/29/2022 0444   CREATININE 0.74 03/27/2022 0330   CREATININE 0.70 03/26/2022 0300   CALCIUM 8.6 (L) 03/29/2022 0444   CALCIUM 9.3 03/27/2022 0330   CALCIUM 9.3 03/26/2022 0300   GFRNONAA >60 03/29/2022 0444   GFRNONAA >60 03/27/2022 0330   GFRNONAA >60 03/26/2022 0300   CBC    Component Value Date/Time   WBC 6.7 03/29/2022 0444   RBC 3.11 (L) 03/29/2022 0444   HGB 9.1 (L) 03/29/2022 0444   HCT 29.6 (L) 03/29/2022 0444   PLT 221 03/29/2022 0444   MCV 95.2 03/29/2022 0444    MCH 29.3 03/29/2022 0444   MCHC 30.7 03/29/2022 0444   RDW 18.8 (H) 03/29/2022 0444   LYMPHSABS 2.0 03/22/2022 0447   MONOABS 0.5 03/22/2022 0447   EOSABS 0.3 03/22/2022 0447   BASOSABS 0.0 03/22/2022 0447   HEPATIC Function Panel Recent Labs    02/22/22 0429 03/03/22 0819 03/17/22 0108 03/19/22 0529 03/22/22 0447 03/29/22 0444  PROT 6.6   < > 6.7 7.7 6.9  --   ALBUMIN 1.8*   < > 2.3* 2.7* 2.6* 2.4*  AST 19   < > '23 26 29  '$ --   ALT 22   < > '17 18 20  '$ --   ALKPHOS 105   < > 95 98 142*  --   BILIDIR 0.1  --   --   --   --   --   IBILI 0.3  --   --   --   --   --    < > = values in this interval not displayed.   HEMOGLOBIN A1C Lab Results  Component Value Date   MPG 208.73 02/14/2022   CARDIAC ENZYMES No results found for: "CKTOTAL", "CKMB", "CKMBINDEX", "TROPONINI" BNP No results for input(s): "PROBNP" in the last 8760 hours. TSH Recent Labs  02/16/22 1636 03/28/22 1512  TSH 0.601 6.315*   CHOLESTEROL Recent Labs    02/16/22 1143  CHOL 214*    Scheduled Meds: Continuous Infusions: PRN Meds:.  Assessment/Plan:  Sinus tachycardia, improving rate control. S/P sepsis S/P epidural abscess Dehydration Urosepsis Dehydration Central diabetes insipidus  Plan: Increase metoprolol to 25 mg. Bid. PO/IV fluids as needed.   LOS: 0 days   Time spent including chart review, lab review, examination, discussion with patient/Nurse : 30 min   [name]  MD  03/29/2022, 7:42 PM

## 2022-03-29 NOTE — Progress Notes (Cosign Needed)
Pulmonary Critical Care Medicine [place]   PULMONARY CRITICAL CARE SERVICE  PROGRESS NOTE     Gabrielle Vincent  WLN:[id number]  DOB: 12-Nov-1965   DOA: 03/21/2022  Referring Physician: Satira Sark, MD  HPI: Gabrielle Vincent is a 56 y.o. female being followed for ventilator/airway/oxygen weaning Acute on Chronic Respiratory Failure.  Patient seen sitting up in cardiac chair.  Currently ATC 28% with PMV in place.  Discussed with her that we will move forward with capping trials today, she expresses that she is very anxious about this.  No acute distress, no acute overnight events.  Medications: Reviewed on Rounds  Physical Exam:  Vitals: Temp 98.2, pulse 114, respirations 26, BP 120/60, SPO2 98%  Ventilator Settings ATC 28% with PMV in place  General: Comfortable at this time Neck: supple Cardiovascular: no malignant arrhythmias Respiratory: Bilaterally clear Skin: no rash seen on limited exam Musculoskeletal: No gross abnormality Psychiatric:unable to assess Neurologic:no involuntary movements         Lab Data:   Basic Metabolic Panel: Recent Labs  Lab 03/23/22 0547 03/24/22 0239 03/24/22 2132 03/26/22 0300 03/27/22 0330 03/29/22 0444  NA 142 146* 143 144 142 136  K 4.0 4.3  --  3.7 3.7 3.1*  CL 109 106  --  105 101 101  CO2 23 26  --  '28 26 25  '$ GLUCOSE 163* 175*  --  182* 115* 143*  BUN 26* 23*  --  26* 23* 16  CREATININE 0.67 0.71  --  0.70 0.74 0.60  CALCIUM 8.8* 9.5  --  9.3 9.3 8.6*  MG 2.3 2.4  --  2.4 2.4 2.1  PHOS  --   --   --   --   --  3.5    ABG: No results for input(s): "PHART", "PCO2ART", "PO2ART", "HCO3", "O2SAT" in the last 168 hours.  Liver Function Tests: Recent Labs  Lab 03/29/22 0444  ALBUMIN 2.4*   No results for input(s): "LIPASE", "AMYLASE" in the last 168 hours. No results for input(s): "AMMONIA" in the last 168 hours.  CBC: Recent Labs  Lab 03/26/22 0300 03/29/22 0444  WBC 7.9 6.7  HGB 10.5* 9.1*   HCT 34.7* 29.6*  MCV 98.0 95.2  PLT 195 221    Cardiac Enzymes: No results for input(s): "CKTOTAL", "CKMB", "CKMBINDEX", "TROPONINI" in the last 168 hours.  BNP (last 3 results) No results for input(s): "BNP" in the last 8760 hours.  ProBNP (last 3 results) No results for input(s): "PROBNP" in the last 8760 hours.  Radiological Exams: No results found.  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  We will move forward with capping trials as tolerated today.  Continue with supportive care. Severe sepsis-has been treated and currently resolved. Encephalopathy-patient remains awake and responsive.  Remains anxious. Bacterial meningitis-continue with medical management and supportive care. Acute stroke-no overall change noted.  Continue with physical therapy.   I have personally seen and evaluated the patient, evaluated laboratory and imaging results, formulated the assessment and plan and placed orders. The Patient requires high complexity decision making with multiple systems involvement.  Rounds were done with the Respiratory Therapy Director and Staff therapists and discussed with nursing staff also.  Allyne Gee, MD [facility] Pulmonary Critical  Care Medicine Sleep Medicine

## 2022-03-30 LAB — POTASSIUM: Potassium: 4 mmol/L (ref 3.5–5.1)

## 2022-03-30 NOTE — Progress Notes (Cosign Needed)
Pulmonary Critical Care Medicine [place]   PULMONARY CRITICAL CARE SERVICE  PROGRESS NOTE     Gabrielle Vincent  PYK:[id number]  DOB: [birth year]   DOA: 03/21/2022  Referring Physician: Satira Sark, MD  HPI: Gabrielle Vincent is a 56 y.o. female being followed for ventilator/airway/oxygen weaning Acute on Chronic Respiratory Failure.  Patient seen sitting up in bed, using her phone.  She has now been capped for 24 hours and remains on 2 L nasal cannula, she continues to do well.  No acute distress, no acute overnight events.  Medications: Reviewed on Rounds  Physical Exam:  Vitals: Temp 97.6, pulse 117, respirations 24, BP 125/64, SPO2 97%  Ventilator Settings capped and on 2 L nasal cannula  General: Comfortable at this time Neck: supple Cardiovascular: no malignant arrhythmias Respiratory: Bilaterally clear Skin: no rash seen on limited exam Musculoskeletal: No gross abnormality Psychiatric:unable to assess Neurologic:no involuntary movements         Lab Data:   Basic Metabolic Panel: Recent Labs  Lab 03/24/22 0239 03/24/22 2132 03/26/22 0300 03/27/22 0330 03/29/22 0444 03/30/22 1448  NA 146* 143 144 142 136  --   K 4.3  --  3.7 3.7 3.1* 4.0  CL 106  --  105 101 101  --   CO2 26  --  '28 26 25  '$ --   GLUCOSE 175*  --  182* 115* 143*  --   BUN 23*  --  26* 23* 16  --   CREATININE 0.71  --  0.70 0.74 0.60  --   CALCIUM 9.5  --  9.3 9.3 8.6*  --   MG 2.4  --  2.4 2.4 2.1  --   PHOS  --   --   --   --  3.5  --     ABG: No results for input(s): "PHART", "PCO2ART", "PO2ART", "HCO3", "O2SAT" in the last 168 hours.  Liver Function Tests: Recent Labs  Lab 03/29/22 0444  ALBUMIN 2.4*   No results for input(s): "LIPASE", "AMYLASE" in the last 168 hours. No results for input(s): "AMMONIA" in the last 168 hours.  CBC: Recent Labs  Lab 03/26/22 0300 03/29/22 0444  WBC 7.9 6.7  HGB 10.5* 9.1*  HCT 34.7* 29.6*  MCV 98.0 95.2  PLT 195  221    Cardiac Enzymes: No results for input(s): "CKTOTAL", "CKMB", "CKMBINDEX", "TROPONINI" in the last 168 hours.  BNP (last 3 results) No results for input(s): "BNP" in the last 8760 hours.  ProBNP (last 3 results) No results for input(s): "PROBNP" in the last 8760 hours.  Radiological Exams: DG CHEST PORT 1 VIEW  Result Date: 03/29/2022 CLINICAL DATA:  Fever and tachycardia. EXAM: PORTABLE CHEST 1 VIEW COMPARISON:  Radiograph 03/27/2022, CT 03/08/2022 FINDINGS: Tracheostomy tube tip projects over the thoracic inlet. Right upper lobe pulmonary nodule better seen on prior chest CT. There is an overlying monitoring device. The right-sided pigtail catheter has been removed. Enteric tube has been removed. No visualized pneumothorax. No acute or consolidative airspace disease. No pulmonary edema or pleural effusion. IMPRESSION: 1. No acute findings. 2. Tracheostomy tube in place. 3. Right upper lobe pulmonary nodule better seen on prior chest CT. 4. No visualized pneumothorax after removal of right pigtail catheter. Electronically Signed   By: Keith Rake M.D.   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  Continue with supportive care. Severe sepsis-has been treated and currently resolved. Encephalopathy-patient remains awake and responsive.  Remains anxious. Bacterial meningitis-continue with medical management and supportive care. Acute stroke-no overall change noted.  Continue with physical therapy.   I have personally seen and evaluated the patient, evaluated laboratory and imaging results, formulated the assessment and plan and placed orders. The Patient  requires high complexity decision making with multiple systems involvement.  Rounds were done with the Respiratory Therapy Director and Staff therapists and discussed with nursing staff also.  Allyne Gee, MD [facility] Pulmonary Critical Care Medicine Sleep Medicine

## 2022-03-31 LAB — CBC
HCT: 30 % — ABNORMAL LOW (ref 36.0–46.0)
Hemoglobin: 9.4 g/dL — ABNORMAL LOW (ref 12.0–15.0)
MCH: 29.4 pg (ref 26.0–34.0)
MCHC: 31.3 g/dL (ref 30.0–36.0)
MCV: 93.8 fL (ref 80.0–100.0)
Platelets: 268 10*3/uL (ref 150–400)
RBC: 3.2 MIL/uL — ABNORMAL LOW (ref 3.87–5.11)
RDW: 18.6 % — ABNORMAL HIGH (ref 11.5–15.5)
WBC: 7.4 10*3/uL (ref 4.0–10.5)
nRBC: 0.3 % — ABNORMAL HIGH (ref 0.0–0.2)

## 2022-03-31 LAB — MAGNESIUM: Magnesium: 2.1 mg/dL (ref 1.7–2.4)

## 2022-03-31 LAB — RENAL FUNCTION PANEL
Albumin: 2.5 g/dL — ABNORMAL LOW (ref 3.5–5.0)
Anion gap: 12 (ref 5–15)
BUN: 15 mg/dL (ref 6–20)
CO2: 23 mmol/L (ref 22–32)
Calcium: 8.8 mg/dL — ABNORMAL LOW (ref 8.9–10.3)
Chloride: 99 mmol/L (ref 98–111)
Creatinine, Ser: 0.59 mg/dL (ref 0.44–1.00)
GFR, Estimated: >60 mL/min (ref >60)
Glucose, Bld: 108 mg/dL — ABNORMAL HIGH (ref 70–99)
Phosphorus: 3.9 mg/dL (ref 2.5–4.6)
Potassium: 3.6 mmol/L (ref 3.5–5.1)
Sodium: 134 mmol/L — ABNORMAL LOW (ref 135–145)

## 2022-03-31 LAB — URINALYSIS, ROUTINE W REFLEX MICROSCOPIC
Bilirubin Urine: NEGATIVE
Glucose, UA: NEGATIVE mg/dL
Ketones, ur: NEGATIVE mg/dL
Nitrite: NEGATIVE
Protein, ur: NEGATIVE mg/dL
Specific Gravity, Urine: 1.008 (ref 1.005–1.030)
pH: 6 (ref 5.0–8.0)

## 2022-03-31 NOTE — Progress Notes (Cosign Needed)
Pulmonary Critical Care Medicine [place]   PULMONARY CRITICAL CARE SERVICE  PROGRESS NOTE     Jennell Janosik  HAL:[id number]  DOB: 07-Nov-1965   DOA: 03/21/2022  Referring Physician: Satira Sark, MD  HPI: Gabrielle Vincent is a 56 y.o. female being followed for ventilator/airway/oxygen weaning Acute on Chronic Respiratory Failure.  Patient seen sitting up in bed, niece at bedside.  Remains on 2 L nasal cannula and capped, capped now for 48 hours and tolerating well.  Likely decannulation tomorrow or Monday.  No acute distress, no acute overnight events.  Medications: Reviewed on Rounds  Physical Exam:  Vitals: Temp 99.1, pulse 105, respirations 21, BP 132/74, SPO2 96%  Ventilator Settings capped and on 2 L nasal cannula  General: Comfortable at this time Neck: supple Cardiovascular: no malignant arrhythmias Respiratory: Bilaterally clear Skin: no rash seen on limited exam Musculoskeletal: No gross abnormality Psychiatric:unable to assess Neurologic:no involuntary movements         Lab Data:   Basic Metabolic Panel: Recent Labs  Lab 03/24/22 2132 03/26/22 0300 03/27/22 0330 03/29/22 0444 03/30/22 1448 03/31/22 0242  NA 143 144 142 136  --  134*  K  --  3.7 3.7 3.1* 4.0 3.6  CL  --  105 101 101  --  99  CO2  --  '28 26 25  '$ --  23  GLUCOSE  --  182* 115* 143*  --  108*  BUN  --  26* 23* 16  --  15  CREATININE  --  0.70 0.74 0.60  --  0.59  CALCIUM  --  9.3 9.3 8.6*  --  8.8*  MG  --  2.4 2.4 2.1  --  2.1  PHOS  --   --   --  3.5  --  3.9    ABG: No results for input(s): "PHART", "PCO2ART", "PO2ART", "HCO3", "O2SAT" in the last 168 hours.  Liver Function Tests: Recent Labs  Lab 03/29/22 0444 03/31/22 0242  ALBUMIN 2.4* 2.5*   No results for input(s): "LIPASE", "AMYLASE" in the last 168 hours. No results for input(s): "AMMONIA" in the last 168 hours.  CBC: Recent Labs  Lab 03/26/22 0300 03/29/22 0444 03/31/22 0242  WBC 7.9  6.7 7.4  HGB 10.5* 9.1* 9.4*  HCT 34.7* 29.6* 30.0*  MCV 98.0 95.2 93.8  PLT 195 221 268    Cardiac Enzymes: No results for input(s): "CKTOTAL", "CKMB", "CKMBINDEX", "TROPONINI" in the last 168 hours.  BNP (last 3 results) No results for input(s): "BNP" in the last 8760 hours.  ProBNP (last 3 results) No results for input(s): "PROBNP" in the last 8760 hours.  Radiological Exams: DG CHEST PORT 1 VIEW  Result Date: 03/29/2022 CLINICAL DATA:  Fever and tachycardia. EXAM: PORTABLE CHEST 1 VIEW COMPARISON:  Radiograph 03/27/2022, CT 03/08/2022 FINDINGS: Tracheostomy tube tip projects over the thoracic inlet. Right upper lobe pulmonary nodule better seen on prior chest CT. There is an overlying monitoring device. The right-sided pigtail catheter has been removed. Enteric tube has been removed. No visualized pneumothorax. No acute or consolidative airspace disease. No pulmonary edema or pleural effusion. IMPRESSION: 1. No acute findings. 2. Tracheostomy tube in place. 3. Right upper lobe pulmonary nodule better seen on prior chest CT. 4. No visualized pneumothorax after removal of right pigtail catheter. Electronically Signed   By: Keith Rake M.D.   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  Continue with supportive care. 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  Currently capped now for 48 hours.  Likely decannulation in the next 48 hours.  Continue with trach care per protocol. Severe sepsis-has been treated and currently resolved. Encephalopathy-patient remains awake and responsive.  Remains anxious. Bacterial meningitis-continue with medical management and  supportive care. Acute stroke-no overall change noted.  Continue with physical therapy.  I have personally seen and evaluated the patient, evaluated laboratory and imaging results, formulated the assessment and plan and placed orders. The Patient requires high complexity decision making with multiple systems involvement.  Rounds were done with the Respiratory Therapy Director and Staff therapists and discussed with nursing staff also.  Allyne Gee, MD [facility] Pulmonary Critical Care Medicine Sleep Medicine

## 2022-04-01 ENCOUNTER — Encounter (HOSPITAL_BASED_OUTPATIENT_CLINIC_OR_DEPARTMENT_OTHER): Payer: BC Managed Care – PPO

## 2022-04-01 DIAGNOSIS — M79604 Pain in right leg: Secondary | ICD-10-CM

## 2022-04-01 DIAGNOSIS — M79605 Pain in left leg: Secondary | ICD-10-CM | POA: Diagnosis not present

## 2022-04-01 LAB — CBC WITH DIFFERENTIAL/PLATELET
Abs Immature Granulocytes: 0.06 10*3/uL (ref 0.00–0.07)
Basophils Absolute: 0 10*3/uL (ref 0.0–0.1)
Basophils Relative: 0 %
Eosinophils Absolute: 0.2 10*3/uL (ref 0.0–0.5)
Eosinophils Relative: 3 %
HCT: 34 % — ABNORMAL LOW (ref 36.0–46.0)
Hemoglobin: 10.8 g/dL — ABNORMAL LOW (ref 12.0–15.0)
Immature Granulocytes: 1 %
Lymphocytes Relative: 25 %
Lymphs Abs: 1.8 10*3/uL (ref 0.7–4.0)
MCH: 29.3 pg (ref 26.0–34.0)
MCHC: 31.8 g/dL (ref 30.0–36.0)
MCV: 92.1 fL (ref 80.0–100.0)
Monocytes Absolute: 0.6 10*3/uL (ref 0.1–1.0)
Monocytes Relative: 8 %
Neutro Abs: 4.6 10*3/uL (ref 1.7–7.7)
Neutrophils Relative %: 63 %
Platelets: 265 10*3/uL (ref 150–400)
RBC: 3.69 MIL/uL — ABNORMAL LOW (ref 3.87–5.11)
RDW: 18.5 % — ABNORMAL HIGH (ref 11.5–15.5)
WBC: 7.3 10*3/uL (ref 4.0–10.5)
nRBC: 0 % (ref 0.0–0.2)

## 2022-04-01 LAB — URINE CULTURE: Culture: NO GROWTH

## 2022-04-01 LAB — D-DIMER, QUANTITATIVE: D-Dimer, Quant: 13.89 ug/mL-FEU — ABNORMAL HIGH (ref 0.00–0.50)

## 2022-04-01 LAB — PROTIME-INR
INR: 1 (ref 0.8–1.2)
Prothrombin Time: 13.1 seconds (ref 11.4–15.2)

## 2022-04-01 LAB — APTT: aPTT: 28 seconds (ref 24–36)

## 2022-04-01 NOTE — Progress Notes (Cosign Needed)
Pulmonary Critical Care Medicine [place]   PULMONARY CRITICAL CARE SERVICE  PROGRESS NOTE     Gabrielle Vincent  ZOX:[id number]  DOB: [birth year]   DOA: 03/21/2022  Referring Physician: Satira Sark, MD  HPI: Gabrielle Vincent is a 56 y.o. female being followed for ventilator/airway/oxygen weaning Acute on Chronic Respiratory Failure.  Patient seen sitting up in bed, currently remains capped and has been for 72 hours, on room air.  Doing very well.  Plan for decannulation tomorrow.  No acute distress, no acute overnight events.  Medications: Reviewed on Rounds  Physical Exam:  Vitals: Temp 99.0, pulse 103, respirations 14, BP 120 467, SPO2 96%  Ventilator Settings capped and on room air  General: Comfortable at this time Neck: supple Cardiovascular: no malignant arrhythmias Respiratory: Bilaterally clear Skin: no rash seen on limited exam Musculoskeletal: No gross abnormality Psychiatric:unable to assess Neurologic:no involuntary movements         Lab Data:   Basic Metabolic Panel: Recent Labs  Lab 03/26/22 0300 03/27/22 0330 03/29/22 0444 03/30/22 1448 03/31/22 0242  NA 144 142 136  --  134*  K 3.7 3.7 3.1* 4.0 3.6  CL 105 101 101  --  99  CO2 '28 26 25  '$ --  23  GLUCOSE 182* 115* 143*  --  108*  BUN 26* 23* 16  --  15  CREATININE 0.70 0.74 0.60  --  0.59  CALCIUM 9.3 9.3 8.6*  --  8.8*  MG 2.4 2.4 2.1  --  2.1  PHOS  --   --  3.5  --  3.9    ABG: No results for input(s): "PHART", "PCO2ART", "PO2ART", "HCO3", "O2SAT" in the last 168 hours.  Liver Function Tests: Recent Labs  Lab 03/29/22 0444 03/31/22 0242  ALBUMIN 2.4* 2.5*   No results for input(s): "LIPASE", "AMYLASE" in the last 168 hours. No results for input(s): "AMMONIA" in the last 168 hours.  CBC: Recent Labs  Lab 03/26/22 0300 03/29/22 0444 03/31/22 0242 04/01/22 1651  WBC 7.9 6.7 7.4 7.3  NEUTROABS  --   --   --  4.6  HGB 10.5* 9.1* 9.4* 10.8*  HCT 34.7* 29.6*  30.0* 34.0*  MCV 98.0 95.2 93.8 92.1  PLT 195 221 268 265    Cardiac Enzymes: No results for input(s): "CKTOTAL", "CKMB", "CKMBINDEX", "TROPONINI" in the last 168 hours.  BNP (last 3 results) No results for input(s): "BNP" in the last 8760 hours.  ProBNP (last 3 results) No results for input(s): "PROBNP" in the last 8760 hours.  Radiological Exams: VAS [place] LOWER EXTREMITY VENOUS (DVT)  Result Date: 04/01/2022  Lower Venous DVT Study Patient Name:  Gabrielle Vincent  Date of Exam:   04/01/2022 Medical Rec #: [id number]       Accession #:    [id number] Date of Birth: 03/18/1966       Patient Gender: F Patient Age:   43 years Exam Location:  [facility] Procedure:      VAS [place] LOWER EXTREMITY VENOUS (DVT) Referring Phys: Janora Norlander --------------------------------------------------------------------------------  Indications: Pain.  Risk Factors: Immobility Surgery. Limitations: Body habitus, poor ultrasound/tissue interface and patient positioning, patient immobility, patient pain tolerance. Comparison Study: No prior studies. Performing Technologist: Oliver Hum RVT  Examination Guidelines: A complete evaluation includes B-mode imaging, spectral Doppler, color Doppler, and power Doppler as needed of all accessible portions of each vessel. Bilateral testing is considered an integral part of a complete examination. Limited examinations for reoccurring indications may be  performed as noted. The reflux portion of the exam is performed with the patient in reverse Trendelenburg.  +---------+---------------+---------+-----------+----------+-------------------+ RIGHT    CompressibilityPhasicitySpontaneityPropertiesThrombus Aging      +---------+---------------+---------+-----------+----------+-------------------+ CFV      None           No       No                   Acute               +---------+---------------+---------+-----------+----------+-------------------+ SFJ      None                                          Acute               +---------+---------------+---------+-----------+----------+-------------------+ FV Prox  None           No       No                   Acute               +---------+---------------+---------+-----------+----------+-------------------+ FV Mid   None           No       No                   Acute               +---------+---------------+---------+-----------+----------+-------------------+ FV DistalNone           No       No                   Acute               +---------+---------------+---------+-----------+----------+-------------------+ PFV                                                   Not well visualized +---------+---------------+---------+-----------+----------+-------------------+ POP      None           No       No                   Acute               +---------+---------------+---------+-----------+----------+-------------------+ PTV      None                                         Acute               +---------+---------------+---------+-----------+----------+-------------------+ PERO                                                  Not well visualized +---------+---------------+---------+-----------+----------+-------------------+ Gastroc  None                                         Acute               +---------+---------------+---------+-----------+----------+-------------------+  SSV      None                                         Acute               +---------+---------------+---------+-----------+----------+-------------------+ EIV                     No       No                   Acute               +---------+---------------+---------+-----------+----------+-------------------+ CIV                     Yes      Yes                                      +---------+---------------+---------+-----------+----------+-------------------+    +---------+---------------+---------+-----------+----------+-------------------+ LEFT     CompressibilityPhasicitySpontaneityPropertiesThrombus Aging      +---------+---------------+---------+-----------+----------+-------------------+ CFV      Partial        Yes      Yes                  Acute               +---------+---------------+---------+-----------+----------+-------------------+ SFJ      Full                                                             +---------+---------------+---------+-----------+----------+-------------------+ FV Prox  None           No       No                   Acute               +---------+---------------+---------+-----------+----------+-------------------+ FV Mid   None           No       No                   Acute               +---------+---------------+---------+-----------+----------+-------------------+ FV DistalNone           No       No                   Acute               +---------+---------------+---------+-----------+----------+-------------------+ PFV      None           No       No                   Acute               +---------+---------------+---------+-----------+----------+-------------------+ POP      None           No       No  Acute               +---------+---------------+---------+-----------+----------+-------------------+ PTV      None                                         Acute               +---------+---------------+---------+-----------+----------+-------------------+ PERO                                                  Not well visualized +---------+---------------+---------+-----------+----------+-------------------+ EIV                     Yes      Yes                                      +---------+---------------+---------+-----------+----------+-------------------+    Summary: RIGHT: - Findings consistent with acute deep vein thrombosis involving the  right external iliac vein, common femoral vein, SF junction, right femoral vein, right popliteal vein, and right posterior tibial veins. - No cystic structure found in the popliteal fossa. - The common iliac vein appears patent.  LEFT: - Findings consistent with acute deep vein thrombosis involving the left common femoral vein, left femoral vein, left proximal profunda vein, left popliteal vein, and left posterior tibial veins. - No cystic structure found in the popliteal fossa. - The external iliac vein appears patent.  *See table(s) above for measurements and observations.    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  Continue with supportive care. 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 Currently capped now for 72 hours. We will plan for decannulation. Continue with trach care per protocol. Severe sepsis-has been treated and currently resolved. Encephalopathy-patient remains awake and responsive.  Remains anxious. Bacterial meningitis-continue with medical management and supportive care. Acute stroke-no overall change noted.  Continue with physical therapy.   I have personally seen and evaluated the patient, evaluated laboratory and imaging results, formulated the assessment and plan and placed orders. The Patient requires high complexity decision making with multiple systems involvement.  Rounds were done with the Respiratory Therapy Director and Staff therapists and discussed with nursing staff also.  Allyne Gee, MD [facility] Pulmonary Critical Care Medicine Sleep Medicine

## 2022-04-01 NOTE — Progress Notes (Signed)
Bilateral lower extremity venous duplex has been completed. Preliminary results can be found in CV Proc through chart review.  Results were given to Janora Norlander NP.  04/01/22 3:31 PM Carlos Levering RVT

## 2022-04-01 NOTE — Consult Note (Signed)
Ref: Rae Halsted, MD   Subjective:  HR in high 90's to 110/min with diltiazem use along with metoprolol.  Objective:  Vital Signs in the last 24 hours:  P: 100, R: 24, BP: 113/41, O2 sat 100 %  Physical Exam: BP Readings from Last 1 Encounters:  03/21/22 128/71     Wt Readings from Last 1 Encounters:  03/20/22 122.5 kg    Weight change:  There is no height or weight on file to calculate BMI. HEENT: [place]/AT, Eyes-Blue, Conjunctiva-Pale pink, Sclera-Non-icteric Neck: No JVD, No bruit, Trachea midline. Lungs:  Clearing, Bilateral. Cardiac:  Regular rhythm, normal S1 and S2, no S3. II/VI systolic murmur. Abdomen:  Soft, non-tender. BS present. Extremities:  1 + edema present. No cyanosis. No clubbing. CNS: AxOx3, Cranial nerves grossly intact, moves all 4 extremities.  Skin: Warm and dry.   Intake/Output from previous day: No intake/output data recorded.    Lab Results: BMET    Component Value Date/Time   NA 134 (L) 03/31/2022 0242   NA 136 03/29/2022 0444   NA 142 03/27/2022 0330   K 3.6 03/31/2022 0242   K 4.0 03/30/2022 1448   K 3.1 (L) 03/29/2022 0444   CL 99 03/31/2022 0242   CL 101 03/29/2022 0444   CL 101 03/27/2022 0330   CO2 23 03/31/2022 0242   CO2 25 03/29/2022 0444   CO2 26 03/27/2022 0330   GLUCOSE 108 (H) 03/31/2022 0242   GLUCOSE 143 (H) 03/29/2022 0444   GLUCOSE 115 (H) 03/27/2022 0330   BUN 15 03/31/2022 0242   BUN 16 03/29/2022 0444   BUN 23 (H) 03/27/2022 0330   CREATININE 0.59 03/31/2022 0242   CREATININE 0.60 03/29/2022 0444   CREATININE 0.74 03/27/2022 0330   CALCIUM 8.8 (L) 03/31/2022 0242   CALCIUM 8.6 (L) 03/29/2022 0444   CALCIUM 9.3 03/27/2022 0330   GFRNONAA >60 03/31/2022 0242   GFRNONAA >60 03/29/2022 0444   GFRNONAA >60 03/27/2022 0330   CBC    Component Value Date/Time   WBC 7.4 03/31/2022 0242   RBC 3.20 (L) 03/31/2022 0242   HGB 9.4 (L) 03/31/2022 0242   HCT 30.0 (L) 03/31/2022 0242   PLT 268 03/31/2022 0242   MCV  93.8 03/31/2022 0242   MCH 29.4 03/31/2022 0242   MCHC 31.3 03/31/2022 0242   RDW 18.6 (H) 03/31/2022 0242   LYMPHSABS 2.0 03/22/2022 0447   MONOABS 0.5 03/22/2022 0447   EOSABS 0.3 03/22/2022 0447   BASOSABS 0.0 03/22/2022 0447   HEPATIC Function Panel Recent Labs    02/22/22 0429 03/03/22 0819 03/17/22 0108 03/19/22 0529 03/22/22 0447 03/29/22 0444 03/31/22 0242  PROT 6.6   < > 6.7 7.7 6.9  --   --   ALBUMIN 1.8*   < > 2.3* 2.7* 2.6* 2.4* 2.5*  AST 19   < > '23 26 29  '$ --   --   ALT 22   < > '17 18 20  '$ --   --   ALKPHOS 105   < > 95 98 142*  --   --   BILIDIR 0.1  --   --   --   --   --   --   IBILI 0.3  --   --   --   --   --   --    < > = values in this interval not displayed.   HEMOGLOBIN A1C Lab Results  Component Value Date   MPG 208.73 02/14/2022   CARDIAC  ENZYMES No results found for: "CKTOTAL", "CKMB", "CKMBINDEX", "TROPONINI" BNP No results for input(s): "PROBNP" in the last 8760 hours. TSH Recent Labs    02/16/22 1636 03/28/22 1512  TSH 0.601 6.315*   CHOLESTEROL Recent Labs    02/16/22 1143  CHOL 214*    Scheduled Meds: Continuous Infusions: PRN Meds:.  Assessment/Plan: Sinus tachycardia, improving Acute on chronic respiratory failure with hypoxia S/P epidural abscess Central diabetes insipidus Dehydration Urosepsis  Plan: Continue medical treatment.   LOS: 0 days   Time spent including chart review, lab review, examination, discussion with patient/Family : 30 min   [name]  MD  04/01/2022, 12:08 PM

## 2022-04-01 NOTE — Consult Note (Incomplete)
Infectious Disease Consultation   Gabrielle Vincent  YPP:[id number]  DOB: 1966/03/03  DOA: 03/21/2022  Requesting physician:  Reason for consultation: Antibiotic Recommendations  History of Present Illness: Gabrielle Vincent is an 56 y.o. female       Review of Systems:  ROS As per HPI otherwise 10 point review of systems negative.    Past Medical History: Past Medical History:  Diagnosis Date   AKI (acute kidney injury) ([place]) 02/14/2022   Aortic atherosclerosis ([place]) 02/14/2022   Class II obesity 02/14/2022   Diverticulosis 02/14/2022   Hepatic steatosis 02/14/2022   Hyperlipidemia 02/07/2016   Seasonal allergies    Swelling of lower extremity    bilateral   Varicose veins    right leg    Past Surgical History: Past Surgical History:  Procedure Laterality Date   LUMBAR LAMINECTOMY/DECOMPRESSION MICRODISCECTOMY N/A 03/01/2022   Procedure: Thoracic eleven - twelve Laminectomy with drainage of epidural abscess;  Surgeon: Kristeen Miss, MD;  Location: [place];  Service: Neurosurgery;  Laterality: N/A;   lymphnode drainage surgery     POSTERIOR CERVICAL FUSION/FORAMINOTOMY N/A 02/24/2022   Procedure: POSTERIOR CERVICAL LAMINECTOMY CERVICAL THREE-CERVICAL SEVEN WITH LATERAL MASS FUSION / FIXATION;  Surgeon: Eustace Moore, MD;  Location: [place];  Service: Neurosurgery;  Laterality: N/A;     Allergies:   Allergies  Allergen Reactions   Erythromycin Anaphylaxis   Penicillins Nausea Only   Prednisone Other (See Comments)    Lymph node swelling     Social History:  reports that she has never smoked. She does not have any smokeless tobacco history on file. She reports that she does not drink alcohol and does not use drugs.   Family History: No family history on file.   Physical Exam: There were no vitals filed for this visit.  Constitutional: Appearance,  Alert and awake, oriented x3, not in any acute distress. Eyes: PERLA, EOMI, irises appear normal, anicteric sclera,   ENMT: external ears and nose appear normal, normal hearing or hard of hearing            Lips appears normal, oropharynx mucosa, tongue, posterior pharynx appear normal  Neck: neck appears normal, no masses, normal ROM, no thyromegaly, no JVD  CVS: S1-S2 clear, no murmur rubs or gallops, no LE edema, normal pedal pulses  Respiratory:  clear to auscultation bilaterally, no wheezing, rales or rhonchi. Respiratory effort normal. No accessory muscle use.  Abdomen: soft nontender, nondistended, normal bowel sounds, no hepatosplenomegaly, no hernias  Musculoskeletal: : no cyanosis, clubbing or edema noted bilaterally                       Joint/bones/muscle exam, strength, contractures or atrophy Neuro: Cranial nerves II-XII intact, strength, sensation, reflexes Psych: judgement and insight appear normal, stable mood and affect, mental status Skin: no rashes or lesions or ulcers, no induration or nodules   Data reviewed:  I have personally reviewed following labs and imaging studies Labs:  CBC: Recent Labs  Lab 03/26/22 0300 03/29/22 0444 03/31/22 0242 04/01/22 1651  WBC 7.9 6.7 7.4 7.3  NEUTROABS  --   --   --  4.6  HGB 10.5* 9.1* 9.4* 10.8*  HCT 34.7* 29.6* 30.0* 34.0*  MCV 98.0 95.2 93.8 92.1  PLT 195 221 268 458    Basic Metabolic Panel: Recent Labs  Lab 03/26/22 0300 03/27/22 0330 03/29/22 0444 03/30/22 1448 03/31/22 0242  NA 144 142 136  --  134*  K 3.7 3.7 3.1* 4.0 3.6  CL 105 101 101  --  99  CO2 '28 26 25  '$ --  23  GLUCOSE 182* 115* 143*  --  108*  BUN 26* 23* 16  --  15  CREATININE 0.70 0.74 0.60  --  0.59  CALCIUM 9.3 9.3 8.6*  --  8.8*  MG 2.4 2.4 2.1  --  2.1  PHOS  --   --  3.5  --  3.9   GFR Estimated Creatinine Clearance: 101.4 mL/min (by C-G formula based on SCr of 0.59 mg/dL). Liver Function Tests: Recent Labs  Lab 03/29/22 0444 03/31/22 0242  ALBUMIN 2.4* 2.5*   No results for input(s): "LIPASE", "AMYLASE" in the last 168 hours. No results for  input(s): "AMMONIA" in the last 168 hours. Coagulation profile Recent Labs  Lab 04/01/22 1651  INR 1.0    Cardiac Enzymes: No results for input(s): "CKTOTAL", "CKMB", "CKMBINDEX", "TROPONINI" in the last 168 hours. BNP: Invalid input(s): "POCBNP" CBG: No results for input(s): "GLUCAP" in the last 168 hours. D-Dimer Recent Labs    04/01/22 1651  DDIMER 13.89*   Hgb A1c No results for input(s): "HGBA1C" in the last 72 hours. Lipid Profile No results for input(s): "CHOL", "HDL", "LDLCALC", "TRIG", "CHOLHDL", "LDLDIRECT" in the last 72 hours. Thyroid function studies No results for input(s): "TSH", "T4TOTAL", "T3FREE", "THYROIDAB" in the last 72 hours.  Invalid input(s): "FREET3" Anemia work up No results for input(s): "VITAMINB12", "FOLATE", "FERRITIN", "TIBC", "IRON", "RETICCTPCT" in the last 72 hours. Urinalysis    Component Value Date/Time   COLORURINE YELLOW 03/31/2022 0951   APPEARANCEUR HAZY (A) 03/31/2022 0951   LABSPEC 1.008 03/31/2022 0951   PHURINE 6.0 03/31/2022 0951   GLUCOSEU NEGATIVE 03/31/2022 0951   HGBUR SMALL (A) 03/31/2022 0951   BILIRUBINUR NEGATIVE 03/31/2022 0951   KETONESUR NEGATIVE 03/31/2022 0951   PROTEINUR NEGATIVE 03/31/2022 0951   NITRITE NEGATIVE 03/31/2022 0951   LEUKOCYTESUR MODERATE (A) 03/31/2022 0951     Sepsis Labs Recent Labs  Lab 03/26/22 0300 03/29/22 0444 03/31/22 0242 04/01/22 1651  WBC 7.9 6.7 7.4 7.3   Microbiology Recent Results (from the past 240 hour(s))  Culture, Urine (Do not remove urinary catheter, catheter placed by urology or difficult to place)     Status: None   Collection Time: 03/31/22  9:50 AM   Specimen: Urine, Catheterized  Result Value Ref Range Status   Specimen Description URINE, CATHETERIZED  Final   Special Requests NONE  Final   Culture   Final    NO GROWTH Performed at [facility], [address], [place], [place] [address]    Report Status 04/01/2022 FINAL  Final        Inpatient Medications:   Scheduled Meds: Continuous Infusions:   Radiological Exams on Admission: VAS [place] LOWER EXTREMITY VENOUS (DVT)  Result Date: 04/01/2022  Lower Venous DVT Study Patient Name:  Gabrielle Vincent  Date of Exam:   04/01/2022 Medical Rec #: [id number]       Accession #:    [id number] Date of Birth: 1966-09-12       Patient Gender: F Patient Age:   42 years Exam Location:  [facility] Procedure:      VAS [place] LOWER EXTREMITY VENOUS (DVT) Referring Phys: Janora Norlander --------------------------------------------------------------------------------  Indications: Pain.  Risk Factors: Immobility Surgery. Limitations: Body habitus, poor ultrasound/tissue interface and patient positioning, patient immobility, patient pain tolerance. Comparison Study: No prior studies. Performing Technologist: Oliver Hum  RVT  Examination Guidelines: A complete evaluation includes B-mode imaging, spectral Doppler, color Doppler, and power Doppler as needed of all accessible portions of each vessel. Bilateral testing is considered an integral part of a complete examination. Limited examinations for reoccurring indications may be performed as noted. The reflux portion of the exam is performed with the patient in reverse Trendelenburg.  +---------+---------------+---------+-----------+----------+-------------------+ RIGHT    CompressibilityPhasicitySpontaneityPropertiesThrombus Aging      +---------+---------------+---------+-----------+----------+-------------------+ CFV      None           No       No                   Acute               +---------+---------------+---------+-----------+----------+-------------------+ SFJ      None                                         Acute               +---------+---------------+---------+-----------+----------+-------------------+ FV Prox  None           No       No                   Acute                +---------+---------------+---------+-----------+----------+-------------------+ FV Mid   None           No       No                   Acute               +---------+---------------+---------+-----------+----------+-------------------+ FV DistalNone           No       No                   Acute               +---------+---------------+---------+-----------+----------+-------------------+ PFV                                                   Not well visualized +---------+---------------+---------+-----------+----------+-------------------+ POP      None           No       No                   Acute               +---------+---------------+---------+-----------+----------+-------------------+ PTV      None                                         Acute               +---------+---------------+---------+-----------+----------+-------------------+ PERO                                                  Not well visualized +---------+---------------+---------+-----------+----------+-------------------+ Gastroc  None  Acute               +---------+---------------+---------+-----------+----------+-------------------+ SSV      None                                         Acute               +---------+---------------+---------+-----------+----------+-------------------+ EIV                     No       No                   Acute               +---------+---------------+---------+-----------+----------+-------------------+ CIV                     Yes      Yes                                      +---------+---------------+---------+-----------+----------+-------------------+   +---------+---------------+---------+-----------+----------+-------------------+ LEFT     CompressibilityPhasicitySpontaneityPropertiesThrombus Aging      +---------+---------------+---------+-----------+----------+-------------------+  CFV      Partial        Yes      Yes                  Acute               +---------+---------------+---------+-----------+----------+-------------------+ SFJ      Full                                                             +---------+---------------+---------+-----------+----------+-------------------+ FV Prox  None           No       No                   Acute               +---------+---------------+---------+-----------+----------+-------------------+ FV Mid   None           No       No                   Acute               +---------+---------------+---------+-----------+----------+-------------------+ FV DistalNone           No       No                   Acute               +---------+---------------+---------+-----------+----------+-------------------+ PFV      None           No       No                   Acute               +---------+---------------+---------+-----------+----------+-------------------+ POP      None           No       No  Acute               +---------+---------------+---------+-----------+----------+-------------------+ PTV      None                                         Acute               +---------+---------------+---------+-----------+----------+-------------------+ PERO                                                  Not well visualized +---------+---------------+---------+-----------+----------+-------------------+ EIV                     Yes      Yes                                      +---------+---------------+---------+-----------+----------+-------------------+    Summary: RIGHT: - Findings consistent with acute deep vein thrombosis involving the right external iliac vein, common femoral vein, SF junction, right femoral vein, right popliteal vein, and right posterior tibial veins. - No cystic structure found in the popliteal fossa. - The common iliac vein appears patent.  LEFT: -  Findings consistent with acute deep vein thrombosis involving the left common femoral vein, left femoral vein, left proximal profunda vein, left popliteal vein, and left posterior tibial veins. - No cystic structure found in the popliteal fossa. - The external iliac vein appears patent.  *See table(s) above for measurements and observations.    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    Yaakov Guthrie M.D. 04/01/2022, 11:22 PM

## 2022-04-01 NOTE — Consult Note (Signed)
Ref: Rae Halsted, MD   Subjective:  Awake. Mild to moderate respiratory distress continues. Trying to drink extra fluids. Sinus tachycardia continues.  Objective:  Vital Signs in the last 24 hours:  P: 110, R: 24, BP : 119/45  Physical Exam: BP Readings from Last 1 Encounters:  03/21/22 128/71     Wt Readings from Last 1 Encounters:  03/20/22 122.5 kg    Weight change:  There is no height or weight on file to calculate BMI. HEENT: Gabrielle Vincent, Eyes-Blue, Conjunctiva-Pale pink, Sclera-Non-icteric Neck: No JVD, No bruit, Trachea midline. Lungs:  Clearing, Bilateral. Cardiac:  Regular rhythm, normal S1 and S2, no S3. II/VI systolic murmur. Abdomen:  Soft, non-tender. BS present. Extremities:  1 + edema present. No cyanosis. No clubbing. CNS: AxOx3, Cranial nerves grossly intact, moves all 4 extremities.  Skin: Warm and dry.   Intake/Output from previous day: No intake/output data recorded.    Lab Results: BMET    Component Value Date/Time   NA 134 (L) 03/31/2022 0242   NA 136 03/29/2022 0444   NA 142 03/27/2022 0330   K 3.6 03/31/2022 0242   K 4.0 03/30/2022 1448   K 3.1 (L) 03/29/2022 0444   CL 99 03/31/2022 0242   CL 101 03/29/2022 0444   CL 101 03/27/2022 0330   CO2 23 03/31/2022 0242   CO2 25 03/29/2022 0444   CO2 26 03/27/2022 0330   GLUCOSE 108 (H) 03/31/2022 0242   GLUCOSE 143 (H) 03/29/2022 0444   GLUCOSE 115 (H) 03/27/2022 0330   BUN 15 03/31/2022 0242   BUN 16 03/29/2022 0444   BUN 23 (H) 03/27/2022 0330   CREATININE 0.59 03/31/2022 0242   CREATININE 0.60 03/29/2022 0444   CREATININE 0.74 03/27/2022 0330   CALCIUM 8.8 (L) 03/31/2022 0242   CALCIUM 8.6 (L) 03/29/2022 0444   CALCIUM 9.3 03/27/2022 0330   GFRNONAA >60 03/31/2022 0242   GFRNONAA >60 03/29/2022 0444   GFRNONAA >60 03/27/2022 0330   CBC    Component Value Date/Time   WBC 7.4 03/31/2022 0242   RBC 3.20 (L) 03/31/2022 0242   HGB 9.4 (L) 03/31/2022 0242   HCT 30.0 (L) 03/31/2022 0242    PLT 268 03/31/2022 0242   MCV 93.8 03/31/2022 0242   MCH 29.4 03/31/2022 0242   MCHC 31.3 03/31/2022 0242   RDW 18.6 (H) 03/31/2022 0242   LYMPHSABS 2.0 03/22/2022 0447   MONOABS 0.5 03/22/2022 0447   EOSABS 0.3 03/22/2022 0447   BASOSABS 0.0 03/22/2022 0447   HEPATIC Function Panel Recent Labs    02/22/22 0429 03/03/22 0819 03/17/22 0108 03/19/22 0529 03/22/22 0447 03/29/22 0444 03/31/22 0242  PROT 6.6   < > 6.7 7.7 6.9  --   --   ALBUMIN 1.8*   < > 2.3* 2.7* 2.6* 2.4* 2.5*  AST 19   < > '23 26 29  '$ --   --   ALT 22   < > '17 18 20  '$ --   --   ALKPHOS 105   < > 95 98 142*  --   --   BILIDIR 0.1  --   --   --   --   --   --   IBILI 0.3  --   --   --   --   --   --    < > = values in this interval not displayed.   HEMOGLOBIN A1C Lab Results  Component Value Date   MPG 208.73 02/14/2022   CARDIAC  ENZYMES No results found for: "CKTOTAL", "CKMB", "CKMBINDEX", "TROPONINI" BNP No results for input(s): "PROBNP" in the last 8760 hours. TSH Recent Labs    02/16/22 1636 03/28/22 1512  TSH 0.601 6.315*   CHOLESTEROL Recent Labs    02/16/22 1143  CHOL 214*    Scheduled Meds: Continuous Infusions: PRN Meds:.  Assessment/Plan:  Sinus tachycardia S/P epidural abscess Acute on chronic respiratory failure Central diabetes insipidus Dehydration Urosepsis  Plan: Add small dose diltiazem for HR control. Continue increased fluid intake.   LOS: 0 days   Time spent including chart review, lab review, examination, discussion with patient/Family : 30 min   [name]  MD  04/01/2022, 11:59 AM

## 2022-04-02 ENCOUNTER — Other Ambulatory Visit (HOSPITAL_COMMUNITY): Payer: Self-pay

## 2022-04-02 LAB — BASIC METABOLIC PANEL
Anion gap: 12 (ref 5–15)
BUN: 13 mg/dL (ref 6–20)
CO2: 24 mmol/L (ref 22–32)
Calcium: 8.9 mg/dL (ref 8.9–10.3)
Chloride: 99 mmol/L (ref 98–111)
Creatinine, Ser: 0.62 mg/dL (ref 0.44–1.00)
GFR, Estimated: >60 mL/min (ref >60)
Glucose, Bld: 119 mg/dL — ABNORMAL HIGH (ref 70–99)
Potassium: 3.6 mmol/L (ref 3.5–5.1)
Sodium: 135 mmol/L (ref 135–145)

## 2022-04-02 LAB — HEPARIN LEVEL (UNFRACTIONATED)
Heparin Unfractionated: 0.21 IU/mL — ABNORMAL LOW (ref 0.30–0.70)
Heparin Unfractionated: 0.23 IU/mL — ABNORMAL LOW (ref 0.30–0.70)
Heparin Unfractionated: 0.23 IU/mL — ABNORMAL LOW (ref 0.30–0.70)
Heparin Unfractionated: 0.4 IU/mL (ref 0.30–0.70)

## 2022-04-02 LAB — CBC
HCT: 32.1 % — ABNORMAL LOW (ref 36.0–46.0)
Hemoglobin: 9.8 g/dL — ABNORMAL LOW (ref 12.0–15.0)
MCH: 28.5 pg (ref 26.0–34.0)
MCHC: 30.5 g/dL (ref 30.0–36.0)
MCV: 93.3 fL (ref 80.0–100.0)
Platelets: 255 10*3/uL (ref 150–400)
RBC: 3.44 MIL/uL — ABNORMAL LOW (ref 3.87–5.11)
RDW: 18.6 % — ABNORMAL HIGH (ref 11.5–15.5)
WBC: 8.1 10*3/uL (ref 4.0–10.5)
nRBC: 0.2 % (ref 0.0–0.2)

## 2022-04-02 LAB — MAGNESIUM: Magnesium: 2.2 mg/dL (ref 1.7–2.4)

## 2022-04-02 IMAGING — CT CT ANGIO CHEST
2 of 7 series · 16 of 46 positions shown · IV contrast (agent unspecified)
Comparison: CT chest [DATE]
COMPARISON: CT chest [DATE]

Addendum:
CLINICAL DATA: r/o PE due to increase ddimer to DEATISEN

EXAM:
CT ANGIOGRAPHY CHEST WITH CONTRAST
TECHNIQUE: Multidetector CT imaging of the chest was performed using the
standard protocol during bolus administration of intravenous
contrast. Multiplanar CT image reconstructions and MIPs were
obtained to evaluate the vascular anatomy.

[Series 8: thins · axial · 0.70mm/px · z∈[-52,+176]mm · 13 of 368 slices shown]
[im 21/368  lung]
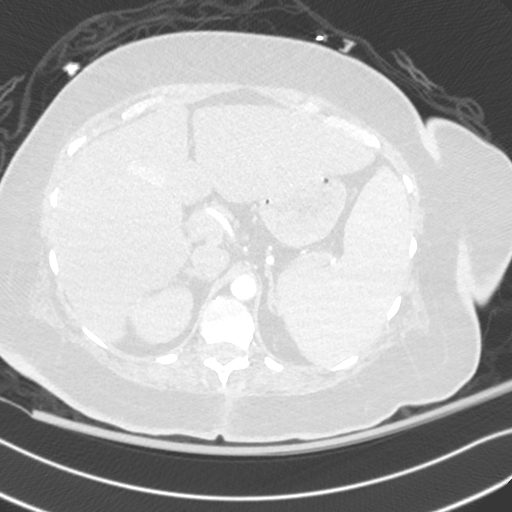
[im 41/368  soft-tissue]
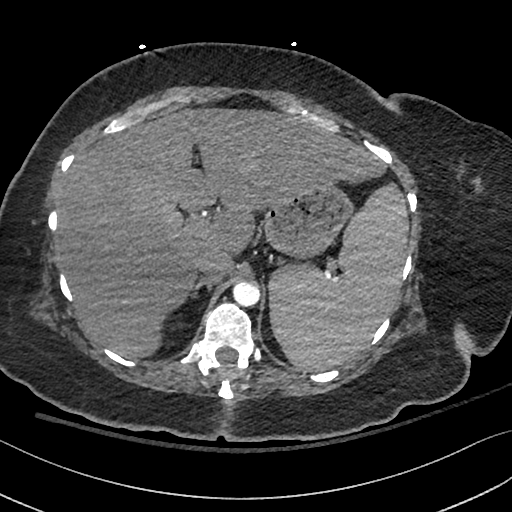
[im 82/368  lung]
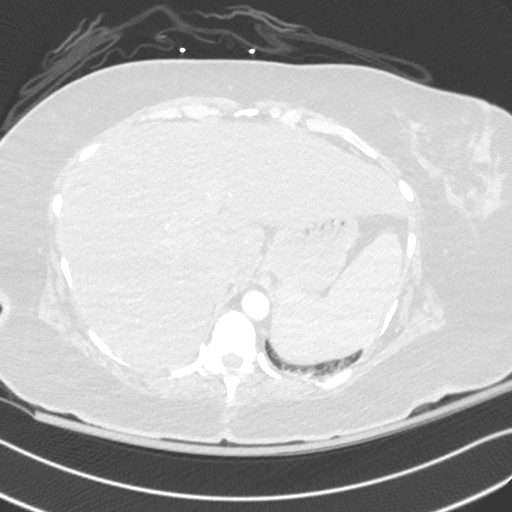
[im 102/368  soft-tissue]
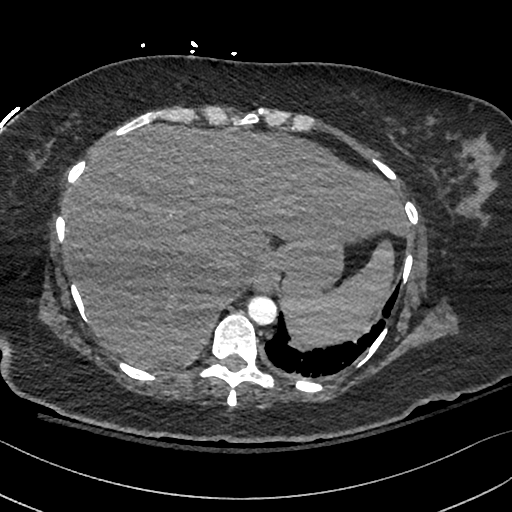
[im 123/368  lung]
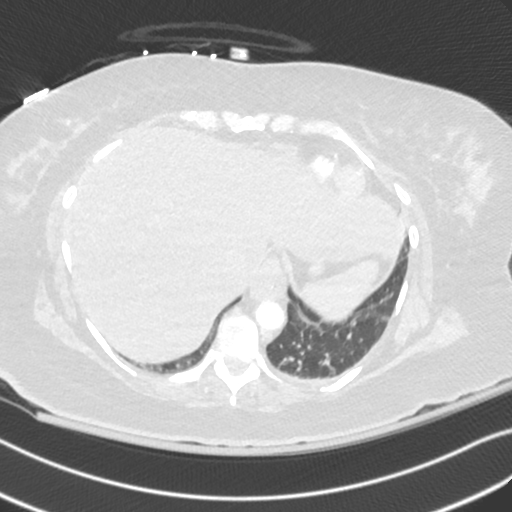
[im 164/368  soft-tissue]
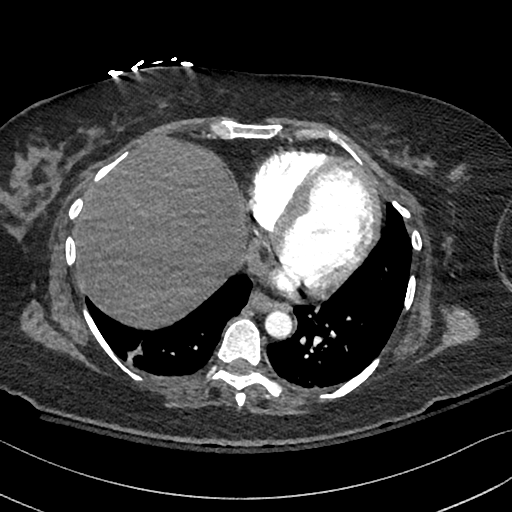
[im 184/368  lung]
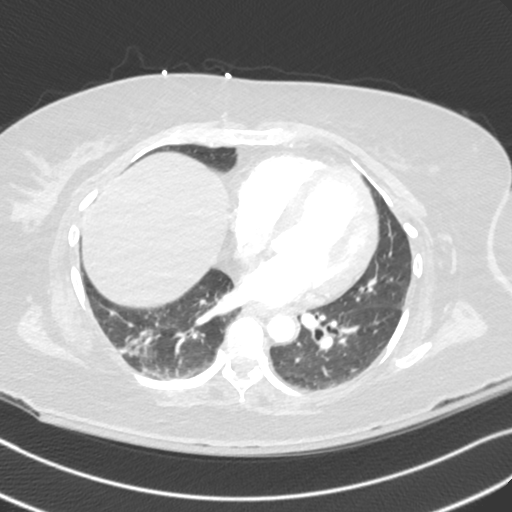
[im 204/368  soft-tissue]
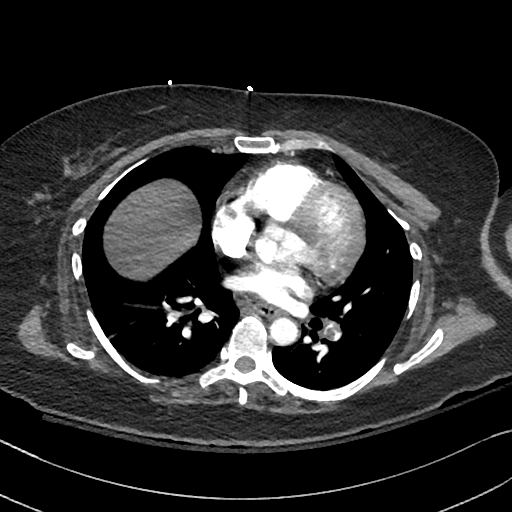
[im 245/368  lung]
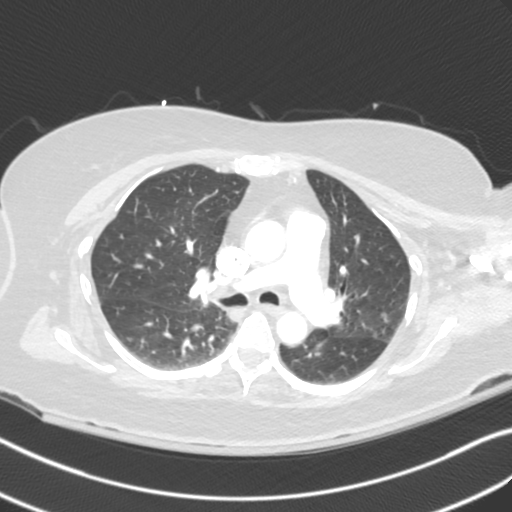
[im 266/368  soft-tissue]
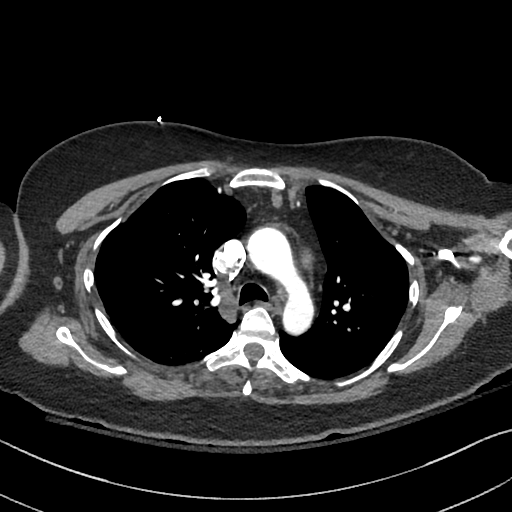
[im 286/368  lung]
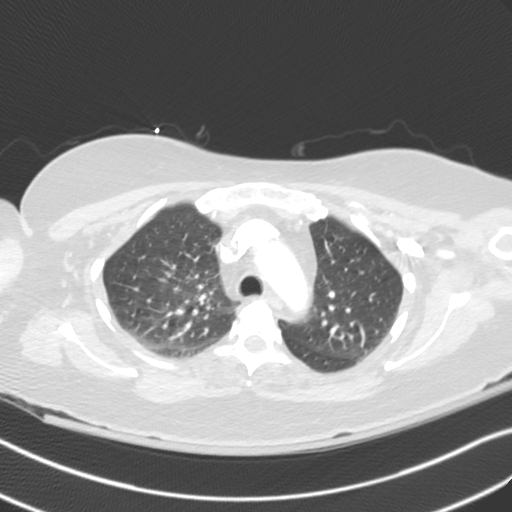
[im 327/368  soft-tissue]
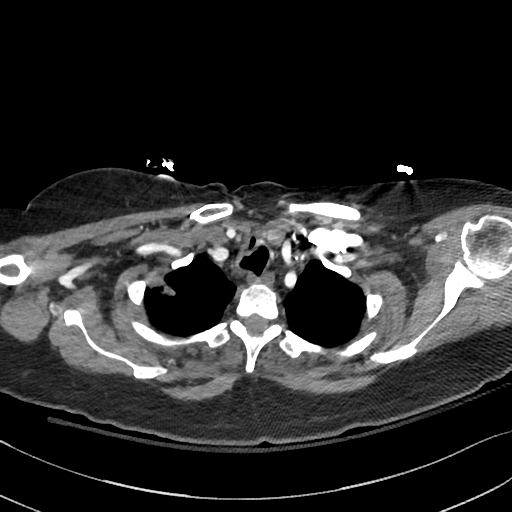
[im 347/368  lung]
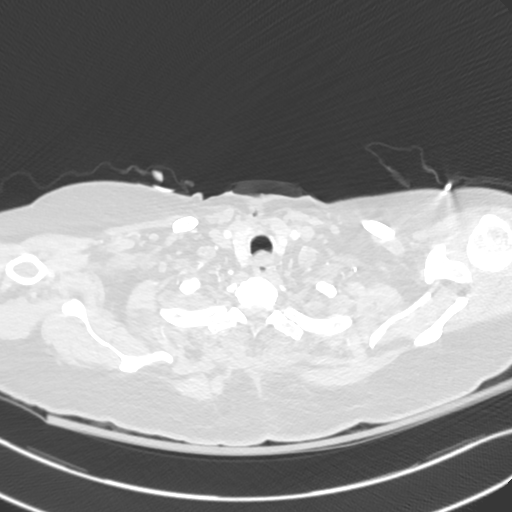

[Series 9: cor · coronal · 0.52mm/px · 3 of 150 slices shown]
[im 38/150  soft-tissue]
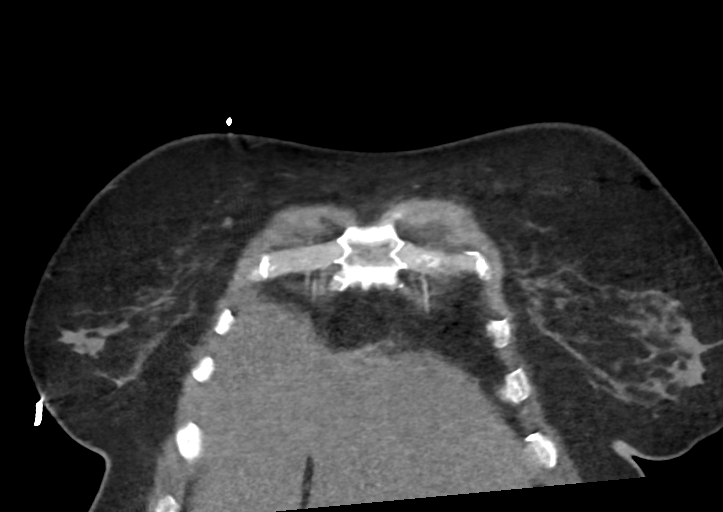
[im 75/150  soft-tissue]
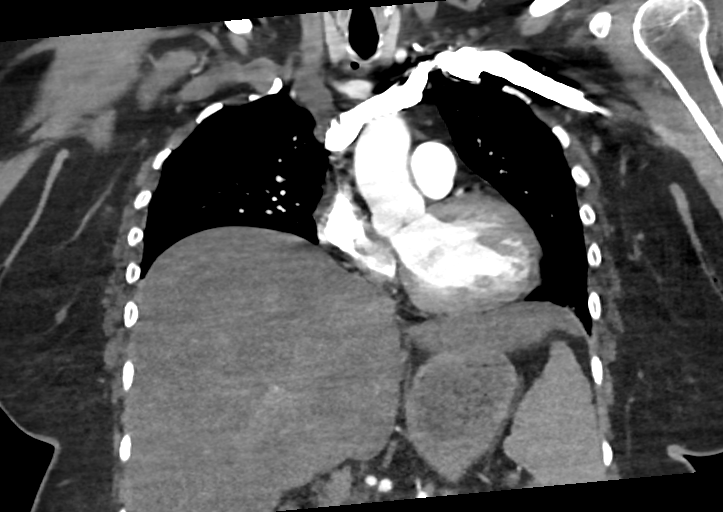
[im 112/150  soft-tissue]
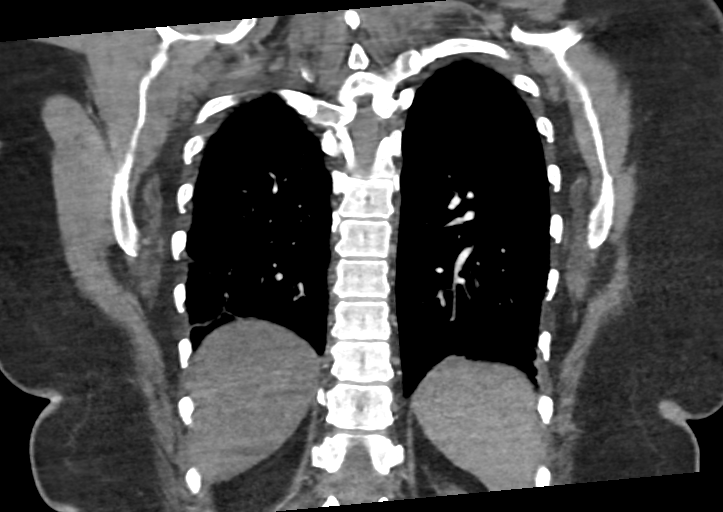

[16 of 46 positions shown; findings below may reference images not displayed]

RADIATION DOSE REDUCTION: This exam was performed according to the
departmental dose-optimization program which includes automated
exposure control, adjustment of the mA and/or kV according to
patient size and/or use of iterative reconstruction technique.

CONTRAST:  65mL OMNIPAQUE IOHEXOL 350 MG/ML SOLN
FINDINGS: Cardiovascular: Satisfactory opacification of the pulmonary arteries
to the segmental level. Left lower lobe segmental and subsegmental
pulmonary artery filling defect that is nonocclusive.\. Normal
heart size. No significant pericardial effusion. The thoracic aorta
is normal in caliber. No atherosclerotic plaque of the thoracic
aorta. No coronary artery calcifications.

Mediastinum/Nodes: No enlarged mediastinal, hilar, or axillary lymph
nodes. Thyroid gland, trachea, and esophagus demonstrate no
significant findings.

Lungs/Pleura:

A interval increase in size of a 2.5 x 1.9 cm (from 2.2 x 1.7 cm)
spiculated pulmonary nodule within the right apex ([DATE]). Associated
pleural tethering. No focal consolidation. No pleural effusion. No
pneumothorax.

Upper Abdomen: Layering hyperdensity within gallbladder lumen
suggestive of cholelithiasis.

Musculoskeletal:

No chest wall abnormality.

No suspicious lytic or blastic osseous lesions. No acute displaced
fracture.

Review of the MIP images confirms the above findings.
IMPRESSION: 1. Nonocclusive left lower lobe segmental and subsegmental pulmonary
embolus. No associated right heart strain or pulmonary infarction.
2. Interval increase in size of a right apical 2.5 x 1.9 cm (from
2.2 x 1.7 cm) spiculated pulmonary nodule. Finding concerning for
malignancy. Additional imaging evaluation or consultation with
Pulmonology or Thoracic Surgery recommended.
3. Cholelithiasis.

ADDENDUM:
These results were called by telephone at the time of interpretation
on [DATE] at [DATE] to provider Dr. DEATISEN, who verbally
acknowledged these results.

*** End of Addendum ***
RADIATION DOSE REDUCTION: This exam was performed according to the
departmental dose-optimization program which includes automated
exposure control, adjustment of the mA and/or kV according to
patient size and/or use of iterative reconstruction technique.

CONTRAST:  65mL OMNIPAQUE IOHEXOL 350 MG/ML SOLN
FINDINGS: Cardiovascular: Satisfactory opacification of the pulmonary arteries
to the segmental level. Left lower lobe segmental and subsegmental
pulmonary artery filling defect that is nonocclusive.\. Normal
heart size. No significant pericardial effusion. The thoracic aorta
is normal in caliber. No atherosclerotic plaque of the thoracic
aorta. No coronary artery calcifications.

Mediastinum/Nodes: No enlarged mediastinal, hilar, or axillary lymph
nodes. Thyroid gland, trachea, and esophagus demonstrate no
significant findings.

Lungs/Pleura:

A interval increase in size of a 2.5 x 1.9 cm (from 2.2 x 1.7 cm)
spiculated pulmonary nodule within the right apex ([DATE]). Associated
pleural tethering. No focal consolidation. No pleural effusion. No
pneumothorax.

Upper Abdomen: Layering hyperdensity within gallbladder lumen
suggestive of cholelithiasis.

Musculoskeletal:

No chest wall abnormality.

No suspicious lytic or blastic osseous lesions. No acute displaced
fracture.

Review of the MIP images confirms the above findings.
IMPRESSION: 1. Nonocclusive left lower lobe segmental and subsegmental pulmonary
embolus. No associated right heart strain or pulmonary infarction.
2. Interval increase in size of a right apical 2.5 x 1.9 cm (from
2.2 x 1.7 cm) spiculated pulmonary nodule. Finding concerning for
malignancy. Additional imaging evaluation or consultation with
Pulmonology or Thoracic Surgery recommended.
3. Cholelithiasis.

## 2022-04-02 MED ORDER — IOHEXOL 350 MG/ML SOLN
65.0000 mL | Freq: Once | INTRAVENOUS | Status: AC | PRN
  Administered 2022-04-02: 65 mL via INTRAVENOUS

## 2022-04-02 NOTE — Progress Notes (Addendum)
Pulmonary Critical Care Medicine [place]   PULMONARY CRITICAL CARE SERVICE  PROGRESS NOTE     Gabrielle Vincent  GNF:[id number]  DOB: [birth year]   DOA: 03/21/2022  Referring Physician: Satira Sark, MD  HPI: Gabrielle Vincent is a 56 y.o. female being followed for ventilator/airway/oxygen weaning Acute on Chronic Respiratory Failure.  Patient seen sitting up in chair, currently remains on room air.  Has been capped now for 4 days.  Order for decannulation placed today.  No acute distress, no acute overnight events.  Medications: Reviewed on Rounds  Physical Exam:  Vitals: Temp 98.2, pulse 106, respirations 25, BP 108/64, SPO2 90%  Ventilator Settings capped and on room air  General: Comfortable at this time Neck: supple Cardiovascular: no malignant arrhythmias Respiratory: Bilaterally clear Skin: no rash seen on limited exam Musculoskeletal: No gross abnormality Psychiatric:unable to assess Neurologic:no involuntary movements         Lab Data:   Basic Metabolic Panel: Recent Labs  Lab 03/27/22 0330 03/29/22 0444 03/30/22 1448 03/31/22 0242 04/02/22 0017  NA 142 136  --  134* 135  K 3.7 3.1* 4.0 3.6 3.6  CL 101 101  --  99 99  CO2 26 25  --  23 24  GLUCOSE 115* 143*  --  108* 119*  BUN 23* 16  --  15 13  CREATININE 0.74 0.60  --  0.59 0.62  CALCIUM 9.3 8.6*  --  8.8* 8.9  MG 2.4 2.1  --  2.1 2.2  PHOS  --  3.5  --  3.9  --     ABG: No results for input(s): "PHART", "PCO2ART", "PO2ART", "HCO3", "O2SAT" in the last 168 hours.  Liver Function Tests: Recent Labs  Lab 03/29/22 0444 03/31/22 0242  ALBUMIN 2.4* 2.5*   No results for input(s): "LIPASE", "AMYLASE" in the last 168 hours. No results for input(s): "AMMONIA" in the last 168 hours.  CBC: Recent Labs  Lab 03/29/22 0444 03/31/22 0242 04/01/22 1651 04/02/22 0017  WBC 6.7 7.4 7.3 8.1  NEUTROABS  --   --  4.6  --   HGB 9.1* 9.4* 10.8* 9.8*  HCT 29.6* 30.0* 34.0* 32.1*   MCV 95.2 93.8 92.1 93.3  PLT 221 268 265 255    Cardiac Enzymes: No results for input(s): "CKTOTAL", "CKMB", "CKMBINDEX", "TROPONINI" in the last 168 hours.  BNP (last 3 results) No results for input(s): "BNP" in the last 8760 hours.  ProBNP (last 3 results) No results for input(s): "PROBNP" in the last 8760 hours.  Radiological Exams: VAS [place] LOWER EXTREMITY VENOUS (DVT)  Result Date: 04/01/2022  Lower Venous DVT Study Patient Name:  Gabrielle Vincent  Date of Exam:   04/01/2022 Medical Rec #: [id number]       Accession #:    [id number] Date of Birth: [birth year]       Patient Gender: F Patient Age:   37 years Exam Location:  [facility] Procedure:      VAS [place] LOWER EXTREMITY VENOUS (DVT) Referring Phys: Janora Norlander --------------------------------------------------------------------------------  Indications: Pain.  Risk Factors: Immobility Surgery. Limitations: Body habitus, poor ultrasound/tissue interface and patient positioning, patient immobility, patient pain tolerance. Comparison Study: No prior studies. Performing Technologist: Oliver Hum RVT  Examination Guidelines: A complete evaluation includes B-mode imaging, spectral Doppler, color Doppler, and power Doppler as needed of all accessible portions of each vessel. Bilateral testing is considered an integral part of a complete examination. Limited examinations for reoccurring indications may be performed as noted.  The reflux portion of the exam is performed with the patient in reverse Trendelenburg.  +---------+---------------+---------+-----------+----------+-------------------+ RIGHT    CompressibilityPhasicitySpontaneityPropertiesThrombus Aging      +---------+---------------+---------+-----------+----------+-------------------+ CFV      None           No       No                   Acute               +---------+---------------+---------+-----------+----------+-------------------+ SFJ      None                                          Acute               +---------+---------------+---------+-----------+----------+-------------------+ FV Prox  None           No       No                   Acute               +---------+---------------+---------+-----------+----------+-------------------+ FV Mid   None           No       No                   Acute               +---------+---------------+---------+-----------+----------+-------------------+ FV DistalNone           No       No                   Acute               +---------+---------------+---------+-----------+----------+-------------------+ PFV                                                   Not well visualized +---------+---------------+---------+-----------+----------+-------------------+ POP      None           No       No                   Acute               +---------+---------------+---------+-----------+----------+-------------------+ PTV      None                                         Acute               +---------+---------------+---------+-----------+----------+-------------------+ PERO                                                  Not well visualized +---------+---------------+---------+-----------+----------+-------------------+ Gastroc  None                                         Acute               +---------+---------------+---------+-----------+----------+-------------------+  SSV      None                                         Acute               +---------+---------------+---------+-----------+----------+-------------------+ EIV                     No       No                   Acute               +---------+---------------+---------+-----------+----------+-------------------+ CIV                     Yes      Yes                                      +---------+---------------+---------+-----------+----------+-------------------+    +---------+---------------+---------+-----------+----------+-------------------+ LEFT     CompressibilityPhasicitySpontaneityPropertiesThrombus Aging      +---------+---------------+---------+-----------+----------+-------------------+ CFV      Partial        Yes      Yes                  Acute               +---------+---------------+---------+-----------+----------+-------------------+ SFJ      Full                                                             +---------+---------------+---------+-----------+----------+-------------------+ FV Prox  None           No       No                   Acute               +---------+---------------+---------+-----------+----------+-------------------+ FV Mid   None           No       No                   Acute               +---------+---------------+---------+-----------+----------+-------------------+ FV DistalNone           No       No                   Acute               +---------+---------------+---------+-----------+----------+-------------------+ PFV      None           No       No                   Acute               +---------+---------------+---------+-----------+----------+-------------------+ POP      None           No       No  Acute               +---------+---------------+---------+-----------+----------+-------------------+ PTV      None                                         Acute               +---------+---------------+---------+-----------+----------+-------------------+ PERO                                                  Not well visualized +---------+---------------+---------+-----------+----------+-------------------+ EIV                     Yes      Yes                                      +---------+---------------+---------+-----------+----------+-------------------+    Summary: RIGHT: - Findings consistent with acute deep vein thrombosis involving the  right external iliac vein, common femoral vein, SF junction, right femoral vein, right popliteal vein, and right posterior tibial veins. - No cystic structure found in the popliteal fossa. - The common iliac vein appears patent.  LEFT: - Findings consistent with acute deep vein thrombosis involving the left common femoral vein, left femoral vein, left proximal profunda vein, left popliteal vein, and left posterior tibial veins. - No cystic structure found in the popliteal fossa. - The external iliac vein appears patent.  *See table(s) above for measurements and observations.    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  Continue with supportive care. 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 Currently capped now for 96 hours.  Order for decannulation placed today.  Continue with stoma care for decannulation. Severe sepsis-has been treated and currently resolved. Encephalopathy-patient remains awake and responsive.  Remains anxious. Bacterial meningitis-continue with medical management and supportive care. Acute stroke-no overall change noted.  Continue with physical therapy.   I have personally seen and evaluated the patient, evaluated laboratory and imaging results, formulated the assessment and plan and placed orders. The Patient requires high complexity decision making with multiple systems involvement.  Rounds were done with the Respiratory Therapy Director and Staff therapists and discussed with nursing staff also.  Allyne Gee, MD [facility] Pulmonary Critical Care Medicine Sleep Medicine

## 2022-04-03 LAB — HEPARIN LEVEL (UNFRACTIONATED)
Heparin Unfractionated: 0.21 IU/mL — ABNORMAL LOW (ref 0.30–0.70)
Heparin Unfractionated: 0.43 IU/mL (ref 0.30–0.70)
Heparin Unfractionated: 0.4 IU/mL (ref 0.30–0.70)
Heparin Unfractionated: 0.19 IU/mL — ABNORMAL LOW (ref 0.30–0.70)

## 2022-04-03 LAB — OSMOLALITY, URINE: Osmolality, Ur: 108 mOsm/kg — ABNORMAL LOW (ref 300–900)

## 2022-04-03 NOTE — Progress Notes (Addendum)
Pulmonary Critical Care Medicine [place]   PULMONARY CRITICAL CARE SERVICE  PROGRESS NOTE     Gabrielle Vincent  IDP:[id number]  DOB: 1965-11-04   DOA: 03/21/2022  Referring Physician: Satira Sark, MD  HPI: Gabrielle Vincent is a 55 y.o. female being followed for ventilator/airway/oxygen weaning Acute on Chronic Respiratory Failure.  Patient seen sitting up in bed, was decannulated yesterday.  Stoma bandage in place.  Currently on heparin drip for pulmonary embolism.  I did discuss with her the findings of pulmonary nodule and increase in size since prior film.  She expresses that she has no interest in wanting to move forward to finding out what it is or doing anything about it.  No acute distress, no acute overnight events.  Medications: Reviewed on Rounds  Physical Exam:  Vitals: Temp 99.0, pulse 115, respirations 21, BP 120/66, SPO2 96% on room air  Ventilator Settings stoma bandage in place, on room air  General: Comfortable at this time Neck: supple Cardiovascular: no malignant arrhythmias Respiratory: Bilaterally clear Skin: no rash seen on limited exam Musculoskeletal: No gross abnormality Psychiatric:unable to assess Neurologic:no involuntary movements         Lab Data:   Basic Metabolic Panel: Recent Labs  Lab 03/29/22 0444 03/30/22 1448 03/31/22 0242 04/02/22 0017  NA 136  --  134* 135  K 3.1* 4.0 3.6 3.6  CL 101  --  99 99  CO2 25  --  23 24  GLUCOSE 143*  --  108* 119*  BUN 16  --  15 13  CREATININE 0.60  --  0.59 0.62  CALCIUM 8.6*  --  8.8* 8.9  MG 2.1  --  2.1 2.2  PHOS 3.5  --  3.9  --     ABG: No results for input(s): "PHART", "PCO2ART", "PO2ART", "HCO3", "O2SAT" in the last 168 hours.  Liver Function Tests: Recent Labs  Lab 03/29/22 0444 03/31/22 0242  ALBUMIN 2.4* 2.5*   No results for input(s): "LIPASE", "AMYLASE" in the last 168 hours. No results for input(s): "AMMONIA" in the last 168 hours.  CBC: Recent  Labs  Lab 03/29/22 0444 03/31/22 0242 04/01/22 1651 04/02/22 0017  WBC 6.7 7.4 7.3 8.1  NEUTROABS  --   --  4.6  --   HGB 9.1* 9.4* 10.8* 9.8*  HCT 29.6* 30.0* 34.0* 32.1*  MCV 95.2 93.8 92.1 93.3  PLT 221 268 265 255    Cardiac Enzymes: No results for input(s): "CKTOTAL", "CKMB", "CKMBINDEX", "TROPONINI" in the last 168 hours.  BNP (last 3 results) No results for input(s): "BNP" in the last 8760 hours.  ProBNP (last 3 results) No results for input(s): "PROBNP" in the last 8760 hours.  Radiological Exams: CT Angio Chest Pulmonary Embolism (PE) W or WO Contrast  Addendum Date: 04/02/2022   ADDENDUM REPORT: 04/02/2022 19:30 ADDENDUM: These results were called by telephone at the time of interpretation on 04/02/2022 at 7:30 pm to provider Dr. Truman Hayward, who verbally acknowledged these results. Electronically Signed   By: Iven Finn M.D.   On: 04/02/2022 19:30   Result Date: 04/02/2022 CLINICAL DATA:  r/o PE due to increase ddimer to bilo EXAM: CT ANGIOGRAPHY CHEST WITH CONTRAST TECHNIQUE: Multidetector CT imaging of the chest was performed using the standard protocol during bolus administration of intravenous contrast. Multiplanar CT image reconstructions and MIPs were obtained to evaluate the vascular anatomy. RADIATION DOSE REDUCTION: This exam was performed according to the departmental dose-optimization program which includes automated exposure control, adjustment  of the mA and/or kV according to patient size and/or use of iterative reconstruction technique. CONTRAST:  16m OMNIPAQUE IOHEXOL 350 MG/ML SOLN COMPARISON:  CT chest 03/08/2022 FINDINGS: Cardiovascular: Satisfactory opacification of the pulmonary arteries to the segmental level. Left lower lobe segmental and subsegmental pulmonary artery filling defect that is nonocclusive.\. Normal heart size. No significant pericardial effusion. The thoracic aorta is normal in caliber. No atherosclerotic plaque of the thoracic aorta. No  coronary artery calcifications. Mediastinum/Nodes: No enlarged mediastinal, hilar, or axillary lymph nodes. Thyroid gland, trachea, and esophagus demonstrate no significant findings. Lungs/Pleura: A interval increase in size of a 2.5 x 1.9 cm (from 2.2 x 1.7 cm) spiculated pulmonary nodule within the right apex (7:20). Associated pleural tethering. No focal consolidation. No pleural effusion. No pneumothorax. Upper Abdomen: Layering hyperdensity within gallbladder lumen suggestive of cholelithiasis. Musculoskeletal: No chest wall abnormality. No suspicious lytic or blastic osseous lesions. No acute displaced fracture. Review of the MIP images confirms the above findings. IMPRESSION: 1. Nonocclusive left lower lobe segmental and subsegmental pulmonary embolus. No associated right heart strain or pulmonary infarction. 2. Interval increase in size of a right apical 2.5 x 1.9 cm (from 2.2 x 1.7 cm) spiculated pulmonary nodule. Finding concerning for malignancy. Additional imaging evaluation or consultation with Pulmonology or Thoracic Surgery recommended. 3. Cholelithiasis. Electronically Signed: By: MIven FinnM.D. On: 04/02/2022 19:15   VAS [place] EXTREMITY VENOUS (DVT)  Result Date: 04/02/2022  Lower Venous DVT Study Patient Name:  Gabrielle Vincent Date of Exam:   04/01/2022 Medical Rec #: [id number]      Accession #:    [id number] of Birth: [birth year]      Patient Gender: F Patient Age:   89years [facility]      [facility] [place] EXTREMITY VENOUS (DVT) Referring Phys: PJanora Norlander--------------------------------------------------------------------------------  Indications: Pain.  Risk Factors: Immobility Surgery. Limitations: Body habitus, poor ultrasound/tissue interface and patient positioning, patient immobility, patient pain tolerance. Comparison Study: No prior studies. Performing Technologist: GOliver HumRVT  Examination Guidelines: A complete evaluation  includes B-mode imaging, spectral Doppler, color Doppler, and power Doppler as needed of all accessible portions of each vessel. Bilateral testing is considered an integral part of a complete examination. Limited examinations for reoccurring indications may be performed as noted. The reflux portion of the exam is performed with the patient in reverse Trendelenburg.  +---------+---------------+---------+-----------+----------+-------------------+ RIGHT    CompressibilityPhasicitySpontaneityPropertiesThrombus Aging      +---------+---------------+---------+-----------+----------+-------------------+ CFV      None           No       No                   Acute               +---------+---------------+---------+-----------+----------+-------------------+ SFJ      None                                         Acute               +---------+---------------+---------+-----------+----------+-------------------+ FV Prox  None           No       No                   Acute               +---------+---------------+---------+-----------+----------+-------------------+  FV Mid   None           No       No                   Acute               +---------+---------------+---------+-----------+----------+-------------------+ FV DistalNone           No       No                   Acute               +---------+---------------+---------+-----------+----------+-------------------+ PFV                                                   Not well visualized +---------+---------------+---------+-----------+----------+-------------------+ POP      None           No       No                   Acute               +---------+---------------+---------+-----------+----------+-------------------+ PTV      None                                         Acute               +---------+---------------+---------+-----------+----------+-------------------+ PERO                                                   Not well visualized +---------+---------------+---------+-----------+----------+-------------------+ Gastroc  None                                         Acute               +---------+---------------+---------+-----------+----------+-------------------+ SSV      None                                         Acute               +---------+---------------+---------+-----------+----------+-------------------+ EIV                     No       No                   Acute               +---------+---------------+---------+-----------+----------+-------------------+ CIV                     Yes      Yes                                      +---------+---------------+---------+-----------+----------+-------------------+   +---------+---------------+---------+-----------+----------+-------------------+ LEFT  CompressibilityPhasicitySpontaneityPropertiesThrombus Aging      +---------+---------------+---------+-----------+----------+-------------------+ CFV      Partial        Yes      Yes                  Acute               +---------+---------------+---------+-----------+----------+-------------------+ SFJ      Full                                                             +---------+---------------+---------+-----------+----------+-------------------+ FV Prox  None           No       No                   Acute               +---------+---------------+---------+-----------+----------+-------------------+ FV Mid   None           No       No                   Acute               +---------+---------------+---------+-----------+----------+-------------------+ FV DistalNone           No       No                   Acute               +---------+---------------+---------+-----------+----------+-------------------+ PFV      None           No       No                   Acute                +---------+---------------+---------+-----------+----------+-------------------+ POP      None           No       No                   Acute               +---------+---------------+---------+-----------+----------+-------------------+ PTV      None                                         Acute               +---------+---------------+---------+-----------+----------+-------------------+ PERO                                                  Not well visualized +---------+---------------+---------+-----------+----------+-------------------+ EIV                     Yes      Yes                                      +---------+---------------+---------+-----------+----------+-------------------+  Summary: RIGHT: - Findings consistent with acute deep vein thrombosis involving the right external iliac vein, common femoral vein, SF junction, right femoral vein, right popliteal vein, and right posterior tibial veins. - No cystic structure found in the popliteal fossa. - The common iliac vein appears patent.  LEFT: - Findings consistent with acute deep vein thrombosis involving the left common femoral vein, left femoral vein, left proximal profunda vein, left popliteal vein, and left posterior tibial veins. - No cystic structure found in the popliteal fossa. - The external iliac vein appears patent.  *See table(s) above for measurements and observations. Electronically signed by Jamelle Haring on 04/02/2022 at 11:43:45 AM.    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 Status post tracheostomy-patient was decannulated yesterday, remains on room air and tolerating well.  Stoma  appears to remain open. Stoma bandage in place. Severe sepsis-has been treated and currently resolved. Encephalopathy-patient remains awake and responsive. Remains mildly anxious. Bacterial meningitis-continue with medical management and supportive care. Acute stroke-no overall change noted.  Continue with physical therapy.  I have personally seen and evaluated the patient, evaluated laboratory and imaging results, formulated the assessment and plan and placed orders. The Patient requires high complexity decision making with multiple systems involvement.  Rounds were done with the Respiratory Therapy Director and Staff therapists and discussed with nursing staff also.  Allyne Gee, MD [facility] Pulmonary Critical Care Medicine Sleep Medicine

## 2022-04-04 LAB — HEPARIN LEVEL (UNFRACTIONATED)
Heparin Unfractionated: 0.55 IU/mL (ref 0.30–0.70)
Heparin Unfractionated: 0.24 IU/mL — ABNORMAL LOW (ref 0.30–0.70)
Heparin Unfractionated: 0.24 IU/mL — ABNORMAL LOW (ref 0.30–0.70)

## 2022-04-04 LAB — BASIC METABOLIC PANEL
Anion gap: 8 (ref 5–15)
BUN: 18 mg/dL (ref 6–20)
CO2: 26 mmol/L (ref 22–32)
Calcium: 9.9 mg/dL (ref 8.9–10.3)
Chloride: 104 mmol/L (ref 98–111)
Creatinine, Ser: 0.66 mg/dL (ref 0.44–1.00)
GFR, Estimated: >60 mL/min (ref >60)
Glucose, Bld: 113 mg/dL — ABNORMAL HIGH (ref 70–99)
Potassium: 4 mmol/L (ref 3.5–5.1)
Sodium: 138 mmol/L (ref 135–145)

## 2022-04-04 LAB — CBC
HCT: 36.6 % (ref 36.0–46.0)
Hemoglobin: 11.3 g/dL — ABNORMAL LOW (ref 12.0–15.0)
MCH: 28.8 pg (ref 26.0–34.0)
MCHC: 30.9 g/dL (ref 30.0–36.0)
MCV: 93.4 fL (ref 80.0–100.0)
Platelets: 242 10*3/uL (ref 150–400)
RBC: 3.92 MIL/uL (ref 3.87–5.11)
RDW: 18.2 % — ABNORMAL HIGH (ref 11.5–15.5)
WBC: 8.3 10*3/uL (ref 4.0–10.5)
nRBC: 0 % (ref 0.0–0.2)

## 2022-04-04 LAB — MAGNESIUM: Magnesium: 2 mg/dL (ref 1.7–2.4)

## 2022-04-04 LAB — APTT: aPTT: 51 seconds — ABNORMAL HIGH (ref 24–36)

## 2022-04-04 NOTE — Progress Notes (Cosign Needed)
Pulmonary Critical Care Medicine [place]   PULMONARY CRITICAL CARE SERVICE  PROGRESS NOTE     Gabrielle Vincent  ZSW:[id number]  DOB: May 22, 1966   DOA: 03/21/2022  Referring Physician: Satira Sark, MD  HPI: Gabrielle Vincent is a 56 y.o. female being followed for ventilator/airway/oxygen weaning Acute on Chronic Respiratory Failure.  Patient seen sitting up in bed, stoma bandage in place.  Remains on room air.  No acute distress, no acute overnight events.  Medications: Reviewed on Rounds  Physical Exam:  Vitals: Temp 96.9, pulse 92, respirations 16, BP 134/70, SPO2 96%  Ventilator Settings room air, stoma bandage in place  General: Comfortable at this time Neck: supple Cardiovascular: no malignant arrhythmias Respiratory: Bilaterally clear Skin: no rash seen on limited exam Musculoskeletal: No gross abnormality Psychiatric:unable to assess Neurologic:no involuntary movements         Lab Data:   Basic Metabolic Panel: Recent Labs  Lab 03/29/22 0444 03/30/22 1448 03/31/22 0242 04/02/22 0017 04/04/22 0639  NA 136  --  134* 135 138  K 3.1* 4.0 3.6 3.6 4.0  CL 101  --  99 99 104  CO2 25  --  '23 24 26  '$ GLUCOSE 143*  --  108* 119* 113*  BUN 16  --  '15 13 18  '$ CREATININE 0.60  --  0.59 0.62 0.66  CALCIUM 8.6*  --  8.8* 8.9 9.9  MG 2.1  --  2.1 2.2 2.0  PHOS 3.5  --  3.9  --   --     ABG: No results for input(s): "PHART", "PCO2ART", "PO2ART", "HCO3", "O2SAT" in the last 168 hours.  Liver Function Tests: Recent Labs  Lab 03/29/22 0444 03/31/22 0242  ALBUMIN 2.4* 2.5*   No results for input(s): "LIPASE", "AMYLASE" in the last 168 hours. No results for input(s): "AMMONIA" in the last 168 hours.  CBC: Recent Labs  Lab 03/29/22 0444 03/31/22 0242 04/01/22 1651 04/02/22 0017 04/04/22 0639  WBC 6.7 7.4 7.3 8.1 8.3  NEUTROABS  --   --  4.6  --   --   HGB 9.1* 9.4* 10.8* 9.8* 11.3*  HCT 29.6* 30.0* 34.0* 32.1* 36.6  MCV 95.2 93.8  92.1 93.3 93.4  PLT 221 268 265 255 242    Cardiac Enzymes: No results for input(s): "CKTOTAL", "CKMB", "CKMBINDEX", "TROPONINI" in the last 168 hours.  BNP (last 3 results) No results for input(s): "BNP" in the last 8760 hours.  ProBNP (last 3 results) No results for input(s): "PROBNP" in the last 8760 hours.  Radiological Exams: CT Angio Chest Pulmonary Embolism (PE) W or WO Contrast  Addendum Date: 04/02/2022   ADDENDUM REPORT: 04/02/2022 19:30 ADDENDUM: These results were called by telephone at the time of interpretation on 04/02/2022 at 7:30 pm to provider Dr. Truman Hayward, who verbally acknowledged these results. Electronically Signed   By: Iven Finn M.D.   On: 04/02/2022 19:30   Result Date: 04/02/2022 CLINICAL DATA:  r/o PE due to increase ddimer to bilo EXAM: CT ANGIOGRAPHY CHEST WITH CONTRAST TECHNIQUE: Multidetector CT imaging of the chest was performed using the standard protocol during bolus administration of intravenous contrast. Multiplanar CT image reconstructions and MIPs were obtained to evaluate the vascular anatomy. RADIATION DOSE REDUCTION: This exam was performed according to the departmental dose-optimization program which includes automated exposure control, adjustment of the mA and/or kV according to patient size and/or use of iterative reconstruction technique. CONTRAST:  105m OMNIPAQUE IOHEXOL 350 MG/ML SOLN COMPARISON:  CT chest 03/08/2022 FINDINGS:  Cardiovascular: Satisfactory opacification of the pulmonary arteries to the segmental level. Left lower lobe segmental and subsegmental pulmonary artery filling defect that is nonocclusive.\. Normal heart size. No significant pericardial effusion. The thoracic aorta is normal in caliber. No atherosclerotic plaque of the thoracic aorta. No coronary artery calcifications. Mediastinum/Nodes: No enlarged mediastinal, hilar, or axillary lymph nodes. Thyroid gland, trachea, and esophagus demonstrate no significant findings.  Lungs/Pleura: A interval increase in size of a 2.5 x 1.9 cm (from 2.2 x 1.7 cm) spiculated pulmonary nodule within the right apex (7:20). Associated pleural tethering. No focal consolidation. No pleural effusion. No pneumothorax. Upper Abdomen: Layering hyperdensity within gallbladder lumen suggestive of cholelithiasis. Musculoskeletal: No chest wall abnormality. No suspicious lytic or blastic osseous lesions. No acute displaced fracture. Review of the MIP images confirms the above findings. IMPRESSION: 1. Nonocclusive left lower lobe segmental and subsegmental pulmonary embolus. No associated right heart strain or pulmonary infarction. 2. Interval increase in size of a right apical 2.5 x 1.9 cm (from 2.2 x 1.7 cm) spiculated pulmonary nodule. Finding concerning for malignancy. Additional imaging evaluation or consultation with Pulmonology or Thoracic Surgery recommended. 3. Cholelithiasis. Electronically Signed: By: Iven Finn M.D. 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  Incidental pulmonary nodule finding that has increased in size since last month, current recommendations is for outpatient pulmonary follow-up for further evaluation. 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  Stoma appears to remain open. Stoma bandage in place. Severe sepsis-has been treated and currently resolved. Encephalopathy-patient remains awake and responsive. Remains mildly anxious. Bacterial meningitis-continue with medical management and supportive care. Acute stroke-no overall change noted.  Continue with physical therapy.   I have  personally seen and evaluated the patient, evaluated laboratory and imaging results, formulated the assessment and plan and placed orders. The Patient requires high complexity decision making with multiple systems involvement.  Rounds were done with the Respiratory Therapy Director and Staff therapists and discussed with nursing staff also.  Allyne Gee, MD [facility] Pulmonary Critical Care Medicine Sleep Medicine

## 2022-04-05 LAB — APTT
aPTT: 58 seconds — ABNORMAL HIGH (ref 24–36)
aPTT: 57 seconds — ABNORMAL HIGH (ref 24–36)

## 2022-04-05 LAB — BASIC METABOLIC PANEL
Anion gap: 12 (ref 5–15)
BUN: 19 mg/dL (ref 6–20)
CO2: 25 mmol/L (ref 22–32)
Calcium: 9.7 mg/dL (ref 8.9–10.3)
Chloride: 98 mmol/L (ref 98–111)
Creatinine, Ser: 0.59 mg/dL (ref 0.44–1.00)
GFR, Estimated: >60 mL/min (ref >60)
Glucose, Bld: 121 mg/dL — ABNORMAL HIGH (ref 70–99)
Potassium: 4 mmol/L (ref 3.5–5.1)
Sodium: 135 mmol/L (ref 135–145)

## 2022-04-05 LAB — HEPARIN LEVEL (UNFRACTIONATED)
Heparin Unfractionated: 0.78 IU/mL — ABNORMAL HIGH (ref 0.30–0.70)
Heparin Unfractionated: 0.3 IU/mL (ref 0.30–0.70)
Heparin Unfractionated: 0.31 IU/mL (ref 0.30–0.70)

## 2022-04-05 LAB — MAGNESIUM: Magnesium: 1.8 mg/dL (ref 1.7–2.4)

## 2022-04-05 LAB — OSMOLALITY, URINE: Osmolality, Ur: 279 mOsm/kg — ABNORMAL LOW (ref 300–900)

## 2022-04-06 LAB — BASIC METABOLIC PANEL
Anion gap: 12 (ref 5–15)
BUN: 21 mg/dL — ABNORMAL HIGH (ref 6–20)
CO2: 24 mmol/L (ref 22–32)
Calcium: 9.6 mg/dL (ref 8.9–10.3)
Chloride: 97 mmol/L — ABNORMAL LOW (ref 98–111)
Creatinine, Ser: 0.69 mg/dL (ref 0.44–1.00)
GFR, Estimated: >60 mL/min (ref >60)
Glucose, Bld: 122 mg/dL — ABNORMAL HIGH (ref 70–99)
Potassium: 4 mmol/L (ref 3.5–5.1)
Sodium: 133 mmol/L — ABNORMAL LOW (ref 135–145)

## 2022-04-06 LAB — CULTURE, BLOOD (ROUTINE X 2)
Culture: NO GROWTH
Culture: NO GROWTH
Special Requests: ADEQUATE

## 2022-04-06 LAB — APTT: aPTT: 54 seconds — ABNORMAL HIGH (ref 24–36)

## 2022-04-06 LAB — MAGNESIUM: Magnesium: 1.7 mg/dL (ref 1.7–2.4)

## 2022-04-06 LAB — HEPARIN LEVEL (UNFRACTIONATED): Heparin Unfractionated: 0.26 IU/mL — ABNORMAL LOW (ref 0.30–0.70)

## 2022-04-06 NOTE — Progress Notes (Signed)
Chart opened in error

## 2022-04-07 LAB — HEPARIN LEVEL (UNFRACTIONATED)
Heparin Unfractionated: 0.2 IU/mL — ABNORMAL LOW (ref 0.30–0.70)
Heparin Unfractionated: 0.4 IU/mL (ref 0.30–0.70)
Heparin Unfractionated: 0.18 IU/mL — ABNORMAL LOW (ref 0.30–0.70)

## 2022-04-07 LAB — APTT
aPTT: 51 seconds — ABNORMAL HIGH (ref 24–36)
aPTT: 52 seconds — ABNORMAL HIGH (ref 24–36)

## 2022-04-08 LAB — BASIC METABOLIC PANEL
Anion gap: 11 (ref 5–15)
BUN: 22 mg/dL — ABNORMAL HIGH (ref 6–20)
CO2: 23 mmol/L (ref 22–32)
Calcium: 9 mg/dL (ref 8.9–10.3)
Chloride: 89 mmol/L — ABNORMAL LOW (ref 98–111)
Creatinine, Ser: 0.56 mg/dL (ref 0.44–1.00)
GFR, Estimated: >60 mL/min (ref >60)
Glucose, Bld: 120 mg/dL — ABNORMAL HIGH (ref 70–99)
Potassium: 4.4 mmol/L (ref 3.5–5.1)
Sodium: 123 mmol/L — ABNORMAL LOW (ref 135–145)

## 2022-04-08 LAB — HEPARIN LEVEL (UNFRACTIONATED)
Heparin Unfractionated: 0.22 IU/mL — ABNORMAL LOW (ref 0.30–0.70)
Heparin Unfractionated: 0.69 IU/mL (ref 0.30–0.70)

## 2022-04-08 LAB — APTT
aPTT: 56 seconds — ABNORMAL HIGH (ref 24–36)
aPTT: 91 seconds — ABNORMAL HIGH (ref 24–36)

## 2022-04-09 LAB — BASIC METABOLIC PANEL
Anion gap: 8 (ref 5–15)
BUN: 19 mg/dL (ref 6–20)
CO2: 23 mmol/L (ref 22–32)
Calcium: 8.9 mg/dL (ref 8.9–10.3)
Chloride: 98 mmol/L (ref 98–111)
Creatinine, Ser: 0.57 mg/dL (ref 0.44–1.00)
GFR, Estimated: >60 mL/min (ref >60)
Glucose, Bld: 172 mg/dL — ABNORMAL HIGH (ref 70–99)
Potassium: 4 mmol/L (ref 3.5–5.1)
Sodium: 129 mmol/L — ABNORMAL LOW (ref 135–145)

## 2022-04-10 LAB — BASIC METABOLIC PANEL
Anion gap: 16 — ABNORMAL HIGH (ref 5–15)
Anion gap: 11 (ref 5–15)
BUN: 20 mg/dL (ref 6–20)
BUN: 21 mg/dL — ABNORMAL HIGH (ref 6–20)
CO2: 22 mmol/L (ref 22–32)
CO2: 24 mmol/L (ref 22–32)
Calcium: 10 mg/dL (ref 8.9–10.3)
Calcium: 9.5 mg/dL (ref 8.9–10.3)
Chloride: 103 mmol/L (ref 98–111)
Chloride: 106 mmol/L (ref 98–111)
Creatinine, Ser: 0.67 mg/dL (ref 0.44–1.00)
Creatinine, Ser: 0.55 mg/dL (ref 0.44–1.00)
GFR, Estimated: >60 mL/min (ref >60)
GFR, Estimated: >60 mL/min (ref >60)
Glucose, Bld: 174 mg/dL — ABNORMAL HIGH (ref 70–99)
Glucose, Bld: 257 mg/dL — ABNORMAL HIGH (ref 70–99)
Potassium: 4.2 mmol/L (ref 3.5–5.1)
Potassium: 3.9 mmol/L (ref 3.5–5.1)
Sodium: 141 mmol/L (ref 135–145)
Sodium: 141 mmol/L (ref 135–145)

## 2022-04-10 LAB — OSMOLALITY, URINE: Osmolality, Ur: 130 mOsm/kg — ABNORMAL LOW (ref 300–900)

## 2022-04-10 LAB — MAGNESIUM: Magnesium: 2.2 mg/dL (ref 1.7–2.4)

## 2022-04-11 LAB — BASIC METABOLIC PANEL
Anion gap: 13 (ref 5–15)
BUN: 19 mg/dL (ref 6–20)
CO2: 24 mmol/L (ref 22–32)
Calcium: 9.8 mg/dL (ref 8.9–10.3)
Chloride: 109 mmol/L (ref 98–111)
Creatinine, Ser: 0.58 mg/dL (ref 0.44–1.00)
GFR, Estimated: >60 mL/min (ref >60)
Glucose, Bld: 156 mg/dL — ABNORMAL HIGH (ref 70–99)
Potassium: 3.9 mmol/L (ref 3.5–5.1)
Sodium: 146 mmol/L — ABNORMAL HIGH (ref 135–145)

## 2022-04-11 LAB — CBC
HCT: 35 % — ABNORMAL LOW (ref 36.0–46.0)
Hemoglobin: 10.6 g/dL — ABNORMAL LOW (ref 12.0–15.0)
MCH: 28.9 pg (ref 26.0–34.0)
MCHC: 30.3 g/dL (ref 30.0–36.0)
MCV: 95.4 fL (ref 80.0–100.0)
Platelets: 208 10*3/uL (ref 150–400)
RBC: 3.67 MIL/uL — ABNORMAL LOW (ref 3.87–5.11)
RDW: 17.9 % — ABNORMAL HIGH (ref 11.5–15.5)
WBC: 9.9 10*3/uL (ref 4.0–10.5)
nRBC: 0 % (ref 0.0–0.2)

## 2022-04-12 LAB — BASIC METABOLIC PANEL
Anion gap: 11 (ref 5–15)
BUN: 21 mg/dL — ABNORMAL HIGH (ref 6–20)
CO2: 24 mmol/L (ref 22–32)
Calcium: 8.9 mg/dL (ref 8.9–10.3)
Chloride: 98 mmol/L (ref 98–111)
Creatinine, Ser: 0.66 mg/dL (ref 0.44–1.00)
GFR, Estimated: >60 mL/min (ref >60)
Glucose, Bld: 201 mg/dL — ABNORMAL HIGH (ref 70–99)
Potassium: 3.7 mmol/L (ref 3.5–5.1)
Sodium: 133 mmol/L — ABNORMAL LOW (ref 135–145)

## 2022-04-13 LAB — BASIC METABOLIC PANEL
Anion gap: 12 (ref 5–15)
BUN: 22 mg/dL — ABNORMAL HIGH (ref 6–20)
CO2: 25 mmol/L (ref 22–32)
Calcium: 9.3 mg/dL (ref 8.9–10.3)
Chloride: 99 mmol/L (ref 98–111)
Creatinine, Ser: 0.56 mg/dL (ref 0.44–1.00)
GFR, Estimated: >60 mL/min (ref >60)
Glucose, Bld: 132 mg/dL — ABNORMAL HIGH (ref 70–99)
Potassium: 3.7 mmol/L (ref 3.5–5.1)
Sodium: 136 mmol/L (ref 135–145)

## 2022-04-14 LAB — BASIC METABOLIC PANEL
Anion gap: 11 (ref 5–15)
BUN: 21 mg/dL — ABNORMAL HIGH (ref 6–20)
CO2: 26 mmol/L (ref 22–32)
Calcium: 9.5 mg/dL (ref 8.9–10.3)
Chloride: 101 mmol/L (ref 98–111)
Creatinine, Ser: 0.55 mg/dL (ref 0.44–1.00)
GFR, Estimated: >60 mL/min (ref >60)
Glucose, Bld: 116 mg/dL — ABNORMAL HIGH (ref 70–99)
Potassium: 3.6 mmol/L (ref 3.5–5.1)
Sodium: 138 mmol/L (ref 135–145)

## 2022-04-15 LAB — URINALYSIS, ROUTINE W REFLEX MICROSCOPIC
Bilirubin Urine: NEGATIVE
Glucose, UA: NEGATIVE mg/dL
Hgb urine dipstick: NEGATIVE
Ketones, ur: NEGATIVE mg/dL
Nitrite: NEGATIVE
Protein, ur: NEGATIVE mg/dL
Specific Gravity, Urine: 1.013 (ref 1.005–1.030)
pH: 6 (ref 5.0–8.0)

## 2022-04-15 LAB — CBC
HCT: 30.7 % — ABNORMAL LOW (ref 36.0–46.0)
Hemoglobin: 10 g/dL — ABNORMAL LOW (ref 12.0–15.0)
MCH: 29.3 pg (ref 26.0–34.0)
MCHC: 32.6 g/dL (ref 30.0–36.0)
MCV: 90 fL (ref 80.0–100.0)
Platelets: 225 10*3/uL (ref 150–400)
RBC: 3.41 MIL/uL — ABNORMAL LOW (ref 3.87–5.11)
RDW: 17.1 % — ABNORMAL HIGH (ref 11.5–15.5)
WBC: 10.7 10*3/uL — ABNORMAL HIGH (ref 4.0–10.5)
nRBC: 0 % (ref 0.0–0.2)

## 2022-04-17 LAB — BASIC METABOLIC PANEL
Anion gap: 13 (ref 5–15)
BUN: 18 mg/dL (ref 6–20)
CO2: 24 mmol/L (ref 22–32)
Calcium: 9.4 mg/dL (ref 8.9–10.3)
Chloride: 100 mmol/L (ref 98–111)
Creatinine, Ser: 0.5 mg/dL (ref 0.44–1.00)
GFR, Estimated: >60 mL/min (ref >60)
Glucose, Bld: 135 mg/dL — ABNORMAL HIGH (ref 70–99)
Potassium: 3.9 mmol/L (ref 3.5–5.1)
Sodium: 137 mmol/L (ref 135–145)

## 2022-04-17 LAB — CBC
HCT: 39.3 % (ref 36.0–46.0)
Hemoglobin: 12 g/dL (ref 12.0–15.0)
MCH: 28.2 pg (ref 26.0–34.0)
MCHC: 30.5 g/dL (ref 30.0–36.0)
MCV: 92.3 fL (ref 80.0–100.0)
Platelets: 224 10*3/uL (ref 150–400)
RBC: 4.26 MIL/uL (ref 3.87–5.11)
RDW: 17.2 % — ABNORMAL HIGH (ref 11.5–15.5)
WBC: 11 10*3/uL — ABNORMAL HIGH (ref 4.0–10.5)
nRBC: 0 % (ref 0.0–0.2)

## 2022-04-17 LAB — URINE CULTURE: Culture: NO GROWTH

## 2022-04-17 LAB — PHOSPHORUS: Phosphorus: 2.8 mg/dL (ref 2.5–4.6)

## 2022-04-17 LAB — MAGNESIUM: Magnesium: 2 mg/dL (ref 1.7–2.4)

## 2022-04-18 LAB — URINE CULTURE: Culture: >=100000 — AB

## 2022-04-19 LAB — BASIC METABOLIC PANEL
Anion gap: 10 (ref 5–15)
BUN: 13 mg/dL (ref 6–20)
CO2: 25 mmol/L (ref 22–32)
Calcium: 9 mg/dL (ref 8.9–10.3)
Chloride: 101 mmol/L (ref 98–111)
Creatinine, Ser: 0.57 mg/dL (ref 0.44–1.00)
GFR, Estimated: >60 mL/min (ref >60)
Glucose, Bld: 149 mg/dL — ABNORMAL HIGH (ref 70–99)
Potassium: 4.4 mmol/L (ref 3.5–5.1)
Sodium: 136 mmol/L (ref 135–145)

## 2022-04-20 LAB — BASIC METABOLIC PANEL
Anion gap: 11 (ref 5–15)
BUN: 9 mg/dL (ref 6–20)
CO2: 25 mmol/L (ref 22–32)
Calcium: 9.1 mg/dL (ref 8.9–10.3)
Chloride: 102 mmol/L (ref 98–111)
Creatinine, Ser: 0.52 mg/dL (ref 0.44–1.00)
GFR, Estimated: >60 mL/min (ref >60)
Glucose, Bld: 111 mg/dL — ABNORMAL HIGH (ref 70–99)
Potassium: 3.8 mmol/L (ref 3.5–5.1)
Sodium: 138 mmol/L (ref 135–145)

## 2022-04-20 LAB — CBC
HCT: 34.1 % — ABNORMAL LOW (ref 36.0–46.0)
Hemoglobin: 10.6 g/dL — ABNORMAL LOW (ref 12.0–15.0)
MCH: 28.3 pg (ref 26.0–34.0)
MCHC: 31.1 g/dL (ref 30.0–36.0)
MCV: 91.2 fL (ref 80.0–100.0)
Platelets: 275 10*3/uL (ref 150–400)
RBC: 3.74 MIL/uL — ABNORMAL LOW (ref 3.87–5.11)
RDW: 17 % — ABNORMAL HIGH (ref 11.5–15.5)
WBC: 10.5 10*3/uL (ref 4.0–10.5)
nRBC: 0 % (ref 0.0–0.2)

## 2022-04-21 LAB — CULTURE, BLOOD (ROUTINE X 2)
Culture: NO GROWTH
Culture: NO GROWTH
Special Requests: ADEQUATE
Special Requests: ADEQUATE

## 2022-05-09 ENCOUNTER — Inpatient Hospital Stay: Payer: BC Managed Care – PPO | Admitting: Internal Medicine

## 2022-05-16 ENCOUNTER — Telehealth: Payer: Self-pay

## 2022-05-16 NOTE — Telephone Encounter (Signed)
Received call from Patient sister/POA- Baird Kay. Patient is currently at [facility] and [place]. Patient is doing well and doing rehab.   Patient still has PICC line in and EOT date was 05/03/2022. Patient re-scheduled for follow up on 8/15 from 7/26  Per Crystal - it would be difficult for patient to make appointment from [facility] due to patient being paralyzed. Since patient is doing better - there is no need for follow up per Dr.Wallace.    Verbal order given to Mechele Claude - Doctor, hospital at [facility] and [place]. Per Dr.Wallace - Pull patient PICC line today 05/16/2022. Mechele Claude verbalized her understanding.    [place], CMA

## 2022-05-17 NOTE — Telephone Encounter (Signed)
Thank you. Gabrielle Vincent

## 2022-05-29 ENCOUNTER — Inpatient Hospital Stay: Payer: BC Managed Care – PPO | Admitting: Internal Medicine
